# Patient Record
Sex: Female | Born: 1950 | Race: Black or African American | Hispanic: No | Marital: Married | Smoking: Never smoker
Health system: Southern US, Community
[De-identification: ages and names within clinical notes are randomized; demographics above are authoritative.]

## PROBLEM LIST (undated history)

## (undated) DIAGNOSIS — D509 Iron deficiency anemia, unspecified: Secondary | ICD-10-CM

## (undated) DIAGNOSIS — I10 Essential (primary) hypertension: Secondary | ICD-10-CM

## (undated) DIAGNOSIS — D473 Essential (hemorrhagic) thrombocythemia: Secondary | ICD-10-CM

## (undated) DIAGNOSIS — R011 Cardiac murmur, unspecified: Secondary | ICD-10-CM

## (undated) DIAGNOSIS — Z87442 Personal history of urinary calculi: Secondary | ICD-10-CM

## (undated) DIAGNOSIS — D759 Disease of blood and blood-forming organs, unspecified: Secondary | ICD-10-CM

## (undated) DIAGNOSIS — D75839 Thrombocytosis, unspecified: Secondary | ICD-10-CM

## (undated) DIAGNOSIS — E119 Type 2 diabetes mellitus without complications: Secondary | ICD-10-CM

## (undated) HISTORY — DX: Essential (hemorrhagic) thrombocythemia: D47.3

## (undated) HISTORY — DX: Thrombocytosis, unspecified: D75.839

## (undated) HISTORY — PX: ABDOMINAL HYSTERECTOMY: SHX81

## (undated) HISTORY — DX: Iron deficiency anemia, unspecified: D50.9

## (undated) HISTORY — PX: BONE MARROW BIOPSY: SHX199

## (undated) HISTORY — DX: Essential (primary) hypertension: I10

---

## 1982-03-02 HISTORY — PX: ECTOPIC PREGNANCY SURGERY: SHX613

## 1999-06-09 ENCOUNTER — Other Ambulatory Visit: Admission: RE | Admit: 1999-06-09 | Discharge: 1999-06-09 | Payer: Self-pay | Admitting: Internal Medicine

## 1999-07-14 ENCOUNTER — Encounter: Payer: Self-pay | Admitting: Internal Medicine

## 1999-07-14 ENCOUNTER — Ambulatory Visit (HOSPITAL_COMMUNITY): Admission: RE | Admit: 1999-07-14 | Discharge: 1999-07-14 | Payer: Self-pay | Admitting: Internal Medicine

## 2000-07-15 ENCOUNTER — Encounter: Payer: Self-pay | Admitting: Internal Medicine

## 2000-07-15 ENCOUNTER — Ambulatory Visit (HOSPITAL_COMMUNITY): Admission: RE | Admit: 2000-07-15 | Discharge: 2000-07-15 | Payer: Self-pay | Admitting: Internal Medicine

## 2002-01-10 ENCOUNTER — Ambulatory Visit (HOSPITAL_COMMUNITY): Admission: RE | Admit: 2002-01-10 | Discharge: 2002-01-10 | Payer: Self-pay | Admitting: Internal Medicine

## 2002-01-10 ENCOUNTER — Encounter: Payer: Self-pay | Admitting: Internal Medicine

## 2002-01-30 ENCOUNTER — Other Ambulatory Visit: Admission: RE | Admit: 2002-01-30 | Discharge: 2002-01-30 | Payer: Self-pay | Admitting: Internal Medicine

## 2003-04-24 ENCOUNTER — Ambulatory Visit (HOSPITAL_COMMUNITY): Admission: RE | Admit: 2003-04-24 | Discharge: 2003-04-24 | Payer: Self-pay | Admitting: Internal Medicine

## 2004-02-20 ENCOUNTER — Ambulatory Visit: Payer: Self-pay | Admitting: Internal Medicine

## 2004-02-20 ENCOUNTER — Other Ambulatory Visit: Admission: RE | Admit: 2004-02-20 | Discharge: 2004-02-20 | Payer: Self-pay | Admitting: Internal Medicine

## 2004-02-21 ENCOUNTER — Ambulatory Visit: Payer: Self-pay | Admitting: Hematology & Oncology

## 2004-02-27 ENCOUNTER — Encounter (INDEPENDENT_AMBULATORY_CARE_PROVIDER_SITE_OTHER): Payer: Self-pay | Admitting: Specialist

## 2004-02-27 ENCOUNTER — Ambulatory Visit (HOSPITAL_COMMUNITY): Admission: RE | Admit: 2004-02-27 | Discharge: 2004-02-27 | Payer: Self-pay | Admitting: Hematology & Oncology

## 2004-04-16 ENCOUNTER — Ambulatory Visit: Payer: Self-pay | Admitting: Hematology & Oncology

## 2004-05-01 ENCOUNTER — Ambulatory Visit (HOSPITAL_COMMUNITY): Admission: RE | Admit: 2004-05-01 | Discharge: 2004-05-01 | Payer: Self-pay | Admitting: Internal Medicine

## 2004-06-18 ENCOUNTER — Ambulatory Visit: Payer: Self-pay | Admitting: Hematology & Oncology

## 2004-08-12 ENCOUNTER — Ambulatory Visit: Payer: Self-pay | Admitting: Hematology & Oncology

## 2004-10-07 ENCOUNTER — Ambulatory Visit: Payer: Self-pay | Admitting: Hematology & Oncology

## 2004-11-24 ENCOUNTER — Ambulatory Visit: Payer: Self-pay | Admitting: Hematology & Oncology

## 2005-01-09 ENCOUNTER — Ambulatory Visit: Payer: Self-pay | Admitting: Hematology & Oncology

## 2005-03-06 ENCOUNTER — Ambulatory Visit: Payer: Self-pay | Admitting: Hematology & Oncology

## 2005-05-08 ENCOUNTER — Ambulatory Visit: Payer: Self-pay | Admitting: Hematology & Oncology

## 2005-06-11 LAB — CBC WITH DIFFERENTIAL/PLATELET
EOS%: 0.7 % (ref 0.0–7.0)
LYMPH%: 31.6 % (ref 14.0–48.0)
MCH: 32.9 pg (ref 26.0–34.0)
MCHC: 34.3 g/dL (ref 32.0–36.0)
MCV: 96 fL (ref 81.0–101.0)
MONO%: 12.4 % (ref 0.0–13.0)
RBC: 3.92 10*6/uL (ref 3.70–5.32)
RDW: 10.8 % — ABNORMAL LOW (ref 11.3–14.5)

## 2005-06-11 LAB — CHCC SMEAR

## 2005-06-16 ENCOUNTER — Ambulatory Visit (HOSPITAL_COMMUNITY): Admission: RE | Admit: 2005-06-16 | Discharge: 2005-06-16 | Payer: Self-pay | Admitting: Internal Medicine

## 2005-07-06 ENCOUNTER — Ambulatory Visit: Payer: Self-pay | Admitting: Hematology & Oncology

## 2005-07-08 LAB — CBC WITH DIFFERENTIAL/PLATELET
Eosinophils Absolute: 0 10*3/uL (ref 0.0–0.5)
HCT: 36.7 % (ref 34.8–46.6)
LYMPH%: 29 % (ref 14.0–48.0)
MCV: 97.4 fL (ref 81.0–101.0)
MONO#: 0.5 10*3/uL (ref 0.1–0.9)
MONO%: 12.2 % (ref 0.0–13.0)
NEUT#: 2.3 10*3/uL (ref 1.5–6.5)
NEUT%: 58.1 % (ref 39.6–76.8)
Platelets: 593 10*3/uL — ABNORMAL HIGH (ref 145–400)
WBC: 4 10*3/uL (ref 3.9–10.0)

## 2005-08-05 LAB — CBC WITH DIFFERENTIAL/PLATELET
Basophils Absolute: 0 10*3/uL (ref 0.0–0.1)
Eosinophils Absolute: 0 10*3/uL (ref 0.0–0.5)
HCT: 37.6 % (ref 34.8–46.6)
HGB: 12.4 g/dL (ref 11.6–15.9)
MCH: 32.4 pg (ref 26.0–34.0)
MCV: 98.3 fL (ref 81.0–101.0)
MONO%: 13.8 % — ABNORMAL HIGH (ref 0.0–13.0)
NEUT#: 2.7 10*3/uL (ref 1.5–6.5)
NEUT%: 52.5 % (ref 39.6–76.8)
RDW: 17.3 % — ABNORMAL HIGH (ref 11.3–14.5)
lymph#: 1.6 10*3/uL (ref 0.9–3.3)

## 2005-08-12 ENCOUNTER — Ambulatory Visit: Payer: Self-pay | Admitting: Internal Medicine

## 2005-08-26 ENCOUNTER — Ambulatory Visit: Payer: Self-pay | Admitting: Hematology & Oncology

## 2005-09-03 LAB — CBC WITH DIFFERENTIAL/PLATELET
Basophils Absolute: 0.1 10*3/uL (ref 0.0–0.1)
Eosinophils Absolute: 0 10*3/uL (ref 0.0–0.5)
HGB: 12 g/dL (ref 11.6–15.9)
MCV: 101 fL (ref 81.0–101.0)
MONO#: 0.3 10*3/uL (ref 0.1–0.9)
MONO%: 9.2 % (ref 0.0–13.0)
NEUT#: 2.2 10*3/uL (ref 1.5–6.5)
RBC: 3.57 10*6/uL — ABNORMAL LOW (ref 3.70–5.32)
RDW: 18.6 % — ABNORMAL HIGH (ref 11.3–14.5)
WBC: 3.8 10*3/uL — ABNORMAL LOW (ref 3.9–10.0)
lymph#: 1.1 10*3/uL (ref 0.9–3.3)

## 2005-09-03 LAB — CHCC SMEAR

## 2005-09-03 LAB — BASIC METABOLIC PANEL
Chloride: 104 mEq/L (ref 96–112)
Glucose, Bld: 164 mg/dL — ABNORMAL HIGH (ref 70–99)
Potassium: 3.5 mEq/L (ref 3.5–5.3)
Sodium: 139 mEq/L (ref 135–145)

## 2005-09-04 LAB — LIPID PANEL
Cholesterol: 174 mg/dL (ref 0–200)
HDL: 43 mg/dL
LDL Cholesterol: 118 mg/dL — ABNORMAL HIGH (ref 0–99)
Total CHOL/HDL Ratio: 4 ratio
Triglycerides: 63 mg/dL
VLDL: 13 mg/dL (ref 0–40)

## 2005-09-14 ENCOUNTER — Ambulatory Visit: Payer: Self-pay | Admitting: Internal Medicine

## 2005-10-01 LAB — CBC WITH DIFFERENTIAL/PLATELET
BASO%: 0.3 % (ref 0.0–2.0)
EOS%: 0.8 % (ref 0.0–7.0)
HCT: 34.5 % — ABNORMAL LOW (ref 34.8–46.6)
MCH: 34 pg (ref 26.0–34.0)
MCHC: 33.1 g/dL (ref 32.0–36.0)
MONO#: 0.7 10*3/uL (ref 0.1–0.9)
RDW: 15.9 % — ABNORMAL HIGH (ref 11.3–14.5)
WBC: 4.6 10*3/uL (ref 3.9–10.0)
lymph#: 1.5 10*3/uL (ref 0.9–3.3)

## 2005-11-16 ENCOUNTER — Ambulatory Visit: Payer: Self-pay | Admitting: Internal Medicine

## 2005-11-30 ENCOUNTER — Ambulatory Visit: Payer: Self-pay | Admitting: Hematology & Oncology

## 2006-01-13 LAB — CBC WITH DIFFERENTIAL/PLATELET
BASO%: 0.5 % (ref 0.0–2.0)
EOS%: 0.2 % (ref 0.0–7.0)
HCT: 34.5 % — ABNORMAL LOW (ref 34.8–46.6)
LYMPH%: 32.2 % (ref 14.0–48.0)
MCH: 36 pg — ABNORMAL HIGH (ref 26.0–34.0)
MCHC: 32.9 g/dL (ref 32.0–36.0)
MONO#: 0.6 10*3/uL (ref 0.1–0.9)
NEUT%: 47.5 % (ref 39.6–76.8)
Platelets: 398 10*3/uL (ref 145–400)
RBC: 3.16 10*6/uL — ABNORMAL LOW (ref 3.70–5.32)
WBC: 3.3 10*3/uL — ABNORMAL LOW (ref 3.9–10.0)
lymph#: 1.1 10*3/uL (ref 0.9–3.3)

## 2006-02-17 ENCOUNTER — Ambulatory Visit: Payer: Self-pay | Admitting: Hematology & Oncology

## 2006-02-24 LAB — CBC WITH DIFFERENTIAL/PLATELET
Basophils Absolute: 0 10*3/uL (ref 0.0–0.1)
EOS%: 0.5 % (ref 0.0–7.0)
Eosinophils Absolute: 0 10*3/uL (ref 0.0–0.5)
HCT: 36.2 % (ref 34.8–46.6)
HGB: 11.9 g/dL (ref 11.6–15.9)
MCH: 36.1 pg — ABNORMAL HIGH (ref 26.0–34.0)
MCV: 110.1 fL — ABNORMAL HIGH (ref 81.0–101.0)
MONO%: 12.2 % (ref 0.0–13.0)
NEUT#: 2.9 10*3/uL (ref 1.5–6.5)
NEUT%: 60.5 % (ref 39.6–76.8)
lymph#: 1.3 10*3/uL (ref 0.9–3.3)

## 2006-04-05 ENCOUNTER — Ambulatory Visit: Payer: Self-pay | Admitting: Hematology & Oncology

## 2006-04-07 LAB — CBC WITH DIFFERENTIAL/PLATELET
BASO%: 1 % (ref 0.0–2.0)
EOS%: 0.4 % (ref 0.0–7.0)
LYMPH%: 28.2 % (ref 14.0–48.0)
MCH: 37.3 pg — ABNORMAL HIGH (ref 26.0–34.0)
MCHC: 34.1 g/dL (ref 32.0–36.0)
MONO#: 0.4 10*3/uL (ref 0.1–0.9)
RBC: 3.31 10*6/uL — ABNORMAL LOW (ref 3.70–5.32)
WBC: 3.9 10*3/uL (ref 3.9–10.0)
lymph#: 1.1 10*3/uL (ref 0.9–3.3)

## 2006-04-07 LAB — CHCC SMEAR

## 2006-05-19 ENCOUNTER — Ambulatory Visit: Payer: Self-pay | Admitting: Hematology & Oncology

## 2006-05-21 LAB — CBC WITH DIFFERENTIAL/PLATELET
Basophils Absolute: 0 10*3/uL (ref 0.0–0.1)
Eosinophils Absolute: 0 10*3/uL (ref 0.0–0.5)
HCT: 35 % (ref 34.8–46.6)
HGB: 12.1 g/dL (ref 11.6–15.9)
LYMPH%: 15.6 % (ref 14.0–48.0)
MCHC: 34.5 g/dL (ref 32.0–36.0)
MONO#: 0.8 10*3/uL (ref 0.1–0.9)
NEUT#: 5.6 10*3/uL (ref 1.5–6.5)
NEUT%: 73.7 % (ref 39.6–76.8)
Platelets: 489 10*3/uL — ABNORMAL HIGH (ref 145–400)
WBC: 7.6 10*3/uL (ref 3.9–10.0)
lymph#: 1.2 10*3/uL (ref 0.9–3.3)

## 2006-06-21 ENCOUNTER — Ambulatory Visit (HOSPITAL_COMMUNITY): Admission: RE | Admit: 2006-06-21 | Discharge: 2006-06-21 | Payer: Self-pay | Admitting: Internal Medicine

## 2006-06-25 ENCOUNTER — Encounter (INDEPENDENT_AMBULATORY_CARE_PROVIDER_SITE_OTHER): Payer: Self-pay | Admitting: *Deleted

## 2006-06-25 ENCOUNTER — Ambulatory Visit: Payer: Self-pay | Admitting: Internal Medicine

## 2006-06-25 ENCOUNTER — Other Ambulatory Visit: Admission: RE | Admit: 2006-06-25 | Discharge: 2006-06-25 | Payer: Self-pay | Admitting: Internal Medicine

## 2006-06-25 LAB — CONVERTED CEMR LAB
ALT: 20 units/L (ref 0–40)
Alkaline Phosphatase: 76 units/L (ref 39–117)
BUN: 10 mg/dL (ref 6–23)
Basophils Absolute: 0 10*3/uL (ref 0.0–0.1)
Calcium: 9.1 mg/dL (ref 8.4–10.5)
Eosinophils Absolute: 0 10*3/uL (ref 0.0–0.6)
GFR calc Af Amer: 111 mL/min
GFR calc non Af Amer: 92 mL/min
HDL: 46.9 mg/dL (ref >39.0)
Lymphocytes Relative: 35.3 % (ref 12.0–46.0)
MCV: 109.4 fL — ABNORMAL HIGH (ref 78.0–100.0)
Monocytes Relative: 9.7 % (ref 3.0–11.0)
Neutro Abs: 2.1 10*3/uL (ref 1.4–7.7)
Platelets: 551 10*3/uL — ABNORMAL HIGH (ref 150–400)
Triglycerides: 44 mg/dL (ref 0–149)
VLDL: 9 mg/dL (ref 0–40)

## 2006-07-06 ENCOUNTER — Ambulatory Visit: Payer: Self-pay | Admitting: Hematology & Oncology

## 2006-07-06 LAB — CBC WITH DIFFERENTIAL/PLATELET
Basophils Absolute: 0.1 10*3/uL (ref 0.0–0.1)
EOS%: 0.9 % (ref 0.0–7.0)
Eosinophils Absolute: 0 10*3/uL (ref 0.0–0.5)
HCT: 37.6 % (ref 34.8–46.6)
HGB: 12.6 g/dL (ref 11.6–15.9)
MCH: 35.5 pg — ABNORMAL HIGH (ref 26.0–34.0)
MCV: 106 fL — ABNORMAL HIGH (ref 81.0–101.0)
MONO%: 14 % — ABNORMAL HIGH (ref 0.0–13.0)
NEUT#: 3 10*3/uL (ref 1.5–6.5)
NEUT%: 58.5 % (ref 39.6–76.8)
Platelets: 545 10*3/uL — ABNORMAL HIGH (ref 145–400)

## 2006-08-11 ENCOUNTER — Ambulatory Visit: Payer: Self-pay | Admitting: Internal Medicine

## 2006-08-11 LAB — CBC WITH DIFFERENTIAL/PLATELET
Basophils Absolute: 0 10*3/uL (ref 0.0–0.1)
EOS%: 0.7 % (ref 0.0–7.0)
Eosinophils Absolute: 0 10*3/uL (ref 0.0–0.5)
LYMPH%: 26.8 % (ref 14.0–48.0)
MCH: 35.5 pg — ABNORMAL HIGH (ref 26.0–34.0)
MCV: 106 fL — ABNORMAL HIGH (ref 81.0–101.0)
MONO%: 10.9 % (ref 0.0–13.0)
Platelets: 586 10*3/uL — ABNORMAL HIGH (ref 145–400)
RBC: 3.32 10*6/uL — ABNORMAL LOW (ref 3.70–5.32)
RDW: 12.8 % (ref 11.3–14.5)

## 2006-08-11 LAB — CHCC SMEAR

## 2006-08-11 LAB — CONVERTED CEMR LAB
CO2: 32 meq/L (ref 19–32)
Calcium: 9.1 mg/dL (ref 8.4–10.5)
Chloride: 102 meq/L (ref 96–112)
GFR calc non Af Amer: 92 mL/min
Glucose, Bld: 112 mg/dL — ABNORMAL HIGH (ref 70–99)

## 2006-08-13 LAB — FERRITIN: Ferritin: 158 ng/mL (ref 10–291)

## 2006-10-18 DIAGNOSIS — I10 Essential (primary) hypertension: Secondary | ICD-10-CM | POA: Insufficient documentation

## 2006-12-06 ENCOUNTER — Ambulatory Visit: Payer: Self-pay | Admitting: Hematology & Oncology

## 2006-12-08 ENCOUNTER — Encounter: Payer: Self-pay | Admitting: Internal Medicine

## 2006-12-08 LAB — CBC WITH DIFFERENTIAL/PLATELET
Basophils Absolute: 0.1 10*3/uL (ref 0.0–0.1)
EOS%: 0.9 % (ref 0.0–7.0)
Eosinophils Absolute: 0 10*3/uL (ref 0.0–0.5)
HCT: 36.3 % (ref 34.8–46.6)
HGB: 12.3 g/dL (ref 11.6–15.9)
MCH: 34.7 pg — ABNORMAL HIGH (ref 26.0–34.0)
MCV: 102 fL — ABNORMAL HIGH (ref 81.0–101.0)
MONO%: 9.2 % (ref 0.0–13.0)
NEUT#: 3.4 10*3/uL (ref 1.5–6.5)
NEUT%: 63.1 % (ref 39.6–76.8)
Platelets: 684 10*3/uL — ABNORMAL HIGH (ref 145–400)
RDW: 11.4 % (ref 11.3–14.5)

## 2007-04-06 ENCOUNTER — Ambulatory Visit: Payer: Self-pay | Admitting: Hematology & Oncology

## 2007-04-11 ENCOUNTER — Encounter: Payer: Self-pay | Admitting: Internal Medicine

## 2007-04-11 LAB — CBC WITH DIFFERENTIAL/PLATELET
Eosinophils Absolute: 0 10*3/uL (ref 0.0–0.5)
HCT: 35.6 % (ref 34.8–46.6)
HGB: 12.2 g/dL (ref 11.6–15.9)
LYMPH%: 23.9 % (ref 14.0–48.0)
MONO#: 0.4 10*3/uL (ref 0.1–0.9)
NEUT#: 3.7 10*3/uL (ref 1.5–6.5)
NEUT%: 67.3 % (ref 39.6–76.8)
Platelets: 708 10*3/uL — ABNORMAL HIGH (ref 145–400)
WBC: 5.6 10*3/uL (ref 3.9–10.0)
lymph#: 1.3 10*3/uL (ref 0.9–3.3)

## 2007-06-23 ENCOUNTER — Ambulatory Visit (HOSPITAL_COMMUNITY): Admission: RE | Admit: 2007-06-23 | Discharge: 2007-06-23 | Payer: Self-pay | Admitting: Internal Medicine

## 2007-07-11 ENCOUNTER — Encounter: Payer: Self-pay | Admitting: Internal Medicine

## 2007-07-11 ENCOUNTER — Ambulatory Visit: Payer: Self-pay | Admitting: Hematology & Oncology

## 2007-07-11 LAB — CBC WITH DIFFERENTIAL/PLATELET
Basophils Absolute: 0 10*3/uL (ref 0.0–0.1)
Eosinophils Absolute: 0 10*3/uL (ref 0.0–0.5)
HGB: 12.6 g/dL (ref 11.6–15.9)
MCV: 107.2 fL — ABNORMAL HIGH (ref 81.0–101.0)
MONO#: 0.5 10*3/uL (ref 0.1–0.9)
NEUT#: 2.5 10*3/uL (ref 1.5–6.5)
RDW: 12.9 % (ref 11.3–14.5)
WBC: 4.4 10*3/uL (ref 3.9–10.0)
lymph#: 1.3 10*3/uL (ref 0.9–3.3)

## 2007-10-10 ENCOUNTER — Ambulatory Visit: Payer: Self-pay | Admitting: Hematology & Oncology

## 2007-10-12 ENCOUNTER — Encounter: Payer: Self-pay | Admitting: Internal Medicine

## 2008-02-14 ENCOUNTER — Ambulatory Visit: Payer: Self-pay | Admitting: Hematology & Oncology

## 2008-02-15 ENCOUNTER — Encounter: Payer: Self-pay | Admitting: Internal Medicine

## 2008-02-15 LAB — CBC WITH DIFFERENTIAL (CANCER CENTER ONLY)
BASO#: 0 10*3/uL (ref 0.0–0.2)
EOS%: 1.3 % (ref 0.0–7.0)
HCT: 39.6 % (ref 34.8–46.6)
HGB: 13.3 g/dL (ref 11.6–15.9)
LYMPH#: 1.4 10*3/uL (ref 0.9–3.3)
MCH: 34.5 pg — ABNORMAL HIGH (ref 26.0–34.0)
MCHC: 33.7 g/dL (ref 32.0–36.0)
NEUT%: 60 % (ref 39.6–80.0)

## 2008-06-05 ENCOUNTER — Ambulatory Visit: Payer: Self-pay | Admitting: Hematology & Oncology

## 2008-06-06 ENCOUNTER — Encounter: Payer: Self-pay | Admitting: Internal Medicine

## 2008-06-06 LAB — CBC WITH DIFFERENTIAL (CANCER CENTER ONLY)
BASO#: 0 10*3/uL (ref 0.0–0.2)
BASO%: 0.7 % (ref 0.0–2.0)
HCT: 40.7 % (ref 34.8–46.6)
HGB: 13.5 g/dL (ref 11.6–15.9)
LYMPH#: 1.6 10*3/uL (ref 0.9–3.3)
MONO#: 0.4 10*3/uL (ref 0.1–0.9)
NEUT%: 52.8 % (ref 39.6–80.0)
WBC: 4.3 10*3/uL (ref 3.9–10.0)

## 2008-06-06 LAB — CHCC SATELLITE - SMEAR

## 2008-06-06 LAB — FERRITIN: Ferritin: 147 ng/mL (ref 10–291)

## 2008-06-08 ENCOUNTER — Telehealth: Payer: Self-pay | Admitting: Internal Medicine

## 2008-06-26 ENCOUNTER — Ambulatory Visit (HOSPITAL_COMMUNITY): Admission: RE | Admit: 2008-06-26 | Discharge: 2008-06-26 | Payer: Self-pay | Admitting: Internal Medicine

## 2008-07-24 ENCOUNTER — Ambulatory Visit: Payer: Self-pay | Admitting: Internal Medicine

## 2008-07-24 LAB — CONVERTED CEMR LAB
Albumin: 3.7 g/dL (ref 3.5–5.2)
BUN: 8 mg/dL (ref 6–23)
Basophils Absolute: 0 10*3/uL (ref 0.0–0.1)
Blood in Urine, dipstick: NEGATIVE
CO2: 31 meq/L (ref 19–32)
Calcium: 8.8 mg/dL (ref 8.4–10.5)
Cholesterol: 182 mg/dL (ref 0–200)
Creatinine, Ser: 0.7 mg/dL (ref 0.4–1.2)
Eosinophils Absolute: 0 10*3/uL (ref 0.0–0.7)
Glucose, Bld: 128 mg/dL — ABNORMAL HIGH (ref 70–99)
HDL: 45 mg/dL (ref >39.00)
Hemoglobin: 12.9 g/dL (ref 12.0–15.0)
Ketones, urine, test strip: NEGATIVE
Lymphocytes Relative: 35 % (ref 12.0–46.0)
Lymphs Abs: 1.2 10*3/uL (ref 0.7–4.0)
Monocytes Absolute: 0.8 10*3/uL (ref 0.1–1.0)
Neutro Abs: 1.4 10*3/uL (ref 1.4–7.7)
Nitrite: NEGATIVE
RBC: 3.56 M/uL — ABNORMAL LOW (ref 3.87–5.11)
Specific Gravity, Urine: 1.01
TSH: 1.66 microintl units/mL (ref 0.35–5.50)
Total CHOL/HDL Ratio: 4
Total Protein: 7.7 g/dL (ref 6.0–8.3)
Triglycerides: 56 mg/dL (ref 0.0–149.0)
Urobilinogen, UA: 0.2
Vit D, 25-Hydroxy: 36 ng/mL (ref 30–89)
WBC: 3.4 10*3/uL — ABNORMAL LOW (ref 4.5–10.5)

## 2008-07-31 ENCOUNTER — Ambulatory Visit: Payer: Self-pay | Admitting: Internal Medicine

## 2008-07-31 ENCOUNTER — Other Ambulatory Visit: Admission: RE | Admit: 2008-07-31 | Discharge: 2008-07-31 | Payer: Self-pay | Admitting: Internal Medicine

## 2008-07-31 ENCOUNTER — Encounter: Payer: Self-pay | Admitting: Internal Medicine

## 2008-07-31 DIAGNOSIS — E876 Hypokalemia: Secondary | ICD-10-CM | POA: Insufficient documentation

## 2008-07-31 DIAGNOSIS — R7309 Other abnormal glucose: Secondary | ICD-10-CM | POA: Insufficient documentation

## 2008-07-31 DIAGNOSIS — E559 Vitamin D deficiency, unspecified: Secondary | ICD-10-CM | POA: Insufficient documentation

## 2008-07-31 DIAGNOSIS — D473 Essential (hemorrhagic) thrombocythemia: Secondary | ICD-10-CM | POA: Insufficient documentation

## 2008-08-02 ENCOUNTER — Telehealth: Payer: Self-pay | Admitting: Internal Medicine

## 2008-08-07 ENCOUNTER — Encounter: Payer: Self-pay | Admitting: Internal Medicine

## 2008-08-07 ENCOUNTER — Ambulatory Visit: Payer: Self-pay

## 2008-08-14 ENCOUNTER — Encounter: Payer: Self-pay | Admitting: Internal Medicine

## 2008-08-22 ENCOUNTER — Telehealth: Payer: Self-pay | Admitting: Internal Medicine

## 2008-08-30 ENCOUNTER — Ambulatory Visit: Payer: Self-pay | Admitting: Cardiology

## 2008-08-30 DIAGNOSIS — R9431 Abnormal electrocardiogram [ECG] [EKG]: Secondary | ICD-10-CM | POA: Insufficient documentation

## 2008-09-04 ENCOUNTER — Ambulatory Visit: Payer: Self-pay | Admitting: Hematology & Oncology

## 2008-09-05 ENCOUNTER — Encounter: Payer: Self-pay | Admitting: Internal Medicine

## 2008-09-05 LAB — CBC WITH DIFFERENTIAL (CANCER CENTER ONLY)
BASO%: 0.5 % (ref 0.0–2.0)
EOS%: 1.4 % (ref 0.0–7.0)
HCT: 38.7 % (ref 34.8–46.6)
LYMPH%: 36.9 % (ref 14.0–48.0)
MCHC: 33 g/dL (ref 32.0–36.0)
MCV: 105 fL — ABNORMAL HIGH (ref 81–101)
MONO%: 10.8 % (ref 0.0–13.0)
NEUT%: 50.4 % (ref 39.6–80.0)
Platelets: 585 10*3/uL — ABNORMAL HIGH (ref 145–400)
RDW: 11.6 % (ref 10.5–14.6)
WBC: 3.6 10*3/uL — ABNORMAL LOW (ref 3.9–10.0)

## 2008-09-07 ENCOUNTER — Ambulatory Visit: Payer: Self-pay | Admitting: Internal Medicine

## 2008-10-30 ENCOUNTER — Ambulatory Visit: Payer: Self-pay | Admitting: Cardiology

## 2008-12-18 ENCOUNTER — Ambulatory Visit: Payer: Self-pay | Admitting: Hematology & Oncology

## 2008-12-19 ENCOUNTER — Encounter (INDEPENDENT_AMBULATORY_CARE_PROVIDER_SITE_OTHER): Payer: Self-pay | Admitting: *Deleted

## 2008-12-19 LAB — CBC WITH DIFFERENTIAL (CANCER CENTER ONLY)
Eosinophils Absolute: 0.1 10*3/uL (ref 0.0–0.5)
HCT: 37.1 % (ref 34.8–46.6)
LYMPH%: 38.7 % (ref 14.0–48.0)
MCH: 35.9 pg — ABNORMAL HIGH (ref 26.0–34.0)
MCV: 105 fL — ABNORMAL HIGH (ref 81–101)
MONO#: 0.5 10*3/uL (ref 0.1–0.9)
MONO%: 11.8 % (ref 0.0–13.0)
NEUT%: 47.9 % (ref 39.6–80.0)
Platelets: 577 10*3/uL — ABNORMAL HIGH (ref 145–400)
RDW: 13.4 % (ref 10.5–14.6)
WBC: 4.3 10*3/uL (ref 3.9–10.0)

## 2008-12-20 LAB — VITAMIN D 25 HYDROXY (VIT D DEFICIENCY, FRACTURES): Vit D, 25-Hydroxy: 29 ng/mL — ABNORMAL LOW (ref 30–89)

## 2009-01-16 ENCOUNTER — Ambulatory Visit: Payer: Self-pay | Admitting: Internal Medicine

## 2009-01-16 LAB — CONVERTED CEMR LAB
CO2: 32 meq/L (ref 19–32)
Glucose, Bld: 94 mg/dL (ref 70–99)
Potassium: 2.8 meq/L — CL (ref 3.5–5.1)
Sodium: 142 meq/L (ref 135–145)

## 2009-01-29 ENCOUNTER — Ambulatory Visit: Payer: Self-pay | Admitting: Internal Medicine

## 2009-02-05 LAB — CONVERTED CEMR LAB
BUN: 10 mg/dL (ref 6–23)
CO2: 29 meq/L (ref 19–32)
Chloride: 104 meq/L (ref 96–112)
Creatinine, Ser: 0.8 mg/dL (ref 0.4–1.2)
Potassium: 3.3 meq/L — ABNORMAL LOW (ref 3.5–5.1)

## 2009-03-06 ENCOUNTER — Ambulatory Visit: Payer: Self-pay | Admitting: Internal Medicine

## 2009-04-09 ENCOUNTER — Ambulatory Visit: Payer: Self-pay | Admitting: Hematology & Oncology

## 2009-04-15 ENCOUNTER — Telehealth: Payer: Self-pay | Admitting: *Deleted

## 2009-04-17 ENCOUNTER — Encounter: Payer: Self-pay | Admitting: Internal Medicine

## 2009-04-17 LAB — CBC WITH DIFFERENTIAL (CANCER CENTER ONLY)
BASO#: 0 10*3/uL (ref 0.0–0.2)
EOS%: 1.4 % (ref 0.0–7.0)
HCT: 38.3 % (ref 34.8–46.6)
HGB: 12.8 g/dL (ref 11.6–15.9)
LYMPH#: 1.4 10*3/uL (ref 0.9–3.3)
LYMPH%: 35.4 % (ref 14.0–48.0)
MCH: 34.7 pg — ABNORMAL HIGH (ref 26.0–34.0)
MCHC: 33.4 g/dL (ref 32.0–36.0)
MCV: 104 fL — ABNORMAL HIGH (ref 81–101)
MONO%: 11.3 % (ref 0.0–13.0)
NEUT%: 51.4 % (ref 39.6–80.0)

## 2009-04-26 LAB — BASIC METABOLIC PANEL
BUN: 13 mg/dL (ref 6–23)
Glucose, Bld: 89 mg/dL (ref 70–99)
Potassium: 4 mEq/L (ref 3.5–5.3)

## 2009-04-26 LAB — FERRITIN: Ferritin: 143 ng/mL (ref 10–291)

## 2009-06-10 ENCOUNTER — Telehealth: Payer: Self-pay | Admitting: *Deleted

## 2009-06-11 ENCOUNTER — Ambulatory Visit: Payer: Self-pay | Admitting: Hematology & Oncology

## 2009-06-12 ENCOUNTER — Encounter: Payer: Self-pay | Admitting: Internal Medicine

## 2009-06-12 ENCOUNTER — Telehealth: Payer: Self-pay | Admitting: Internal Medicine

## 2009-06-12 LAB — CBC WITH DIFFERENTIAL (CANCER CENTER ONLY)
BASO#: 0 10*3/uL (ref 0.0–0.2)
Eosinophils Absolute: 0.1 10*3/uL (ref 0.0–0.5)
HGB: 13.2 g/dL (ref 11.6–15.9)
LYMPH%: 36 % (ref 14.0–48.0)
MCH: 34.2 pg — ABNORMAL HIGH (ref 26.0–34.0)
MCV: 104 fL — ABNORMAL HIGH (ref 81–101)
MONO%: 7.3 % (ref 0.0–13.0)
RBC: 3.86 10*6/uL (ref 3.70–5.32)

## 2009-06-25 ENCOUNTER — Ambulatory Visit: Payer: Self-pay | Admitting: Internal Medicine

## 2009-07-08 ENCOUNTER — Ambulatory Visit (HOSPITAL_COMMUNITY): Admission: RE | Admit: 2009-07-08 | Discharge: 2009-07-08 | Payer: Self-pay | Admitting: Internal Medicine

## 2009-07-24 ENCOUNTER — Ambulatory Visit: Payer: Self-pay | Admitting: Internal Medicine

## 2009-08-09 ENCOUNTER — Ambulatory Visit: Payer: Self-pay | Admitting: Hematology & Oncology

## 2009-08-12 ENCOUNTER — Encounter: Payer: Self-pay | Admitting: Internal Medicine

## 2009-08-12 LAB — CBC WITH DIFFERENTIAL (CANCER CENTER ONLY)
BASO#: 0 10*3/uL (ref 0.0–0.2)
Eosinophils Absolute: 0.1 10*3/uL (ref 0.0–0.5)
HCT: 40.6 % (ref 34.8–46.6)
HGB: 13.7 g/dL (ref 11.6–15.9)
LYMPH#: 1.2 10*3/uL (ref 0.9–3.3)
MCH: 35 pg — ABNORMAL HIGH (ref 26.0–34.0)
MONO%: 7.8 % (ref 0.0–13.0)
NEUT#: 3.6 10*3/uL (ref 1.5–6.5)
NEUT%: 66.8 % (ref 39.6–80.0)
RBC: 3.9 10*6/uL (ref 3.70–5.32)

## 2009-08-22 ENCOUNTER — Telehealth: Payer: Self-pay | Admitting: Internal Medicine

## 2009-08-30 ENCOUNTER — Telehealth: Payer: Self-pay | Admitting: *Deleted

## 2009-09-03 ENCOUNTER — Ambulatory Visit: Payer: Self-pay | Admitting: Internal Medicine

## 2009-10-07 ENCOUNTER — Telehealth: Payer: Self-pay | Admitting: *Deleted

## 2009-10-29 ENCOUNTER — Ambulatory Visit: Payer: Self-pay | Admitting: Internal Medicine

## 2009-11-08 ENCOUNTER — Ambulatory Visit: Payer: Self-pay | Admitting: Hematology & Oncology

## 2009-11-11 ENCOUNTER — Encounter: Payer: Self-pay | Admitting: Internal Medicine

## 2009-11-11 LAB — CBC WITH DIFFERENTIAL (CANCER CENTER ONLY)
BASO#: 0 10*3/uL (ref 0.0–0.2)
Eosinophils Absolute: 0.1 10*3/uL (ref 0.0–0.5)
HCT: 39.2 % (ref 34.8–46.6)
HGB: 13 g/dL (ref 11.6–15.9)
LYMPH%: 31.8 % (ref 14.0–48.0)
MCH: 34.5 pg — ABNORMAL HIGH (ref 26.0–34.0)
MCV: 104 fL — ABNORMAL HIGH (ref 81–101)
MONO#: 0.5 10*3/uL (ref 0.1–0.9)
Platelets: 732 10*3/uL — ABNORMAL HIGH (ref 145–400)
RBC: 3.78 10*6/uL (ref 3.70–5.32)
WBC: 4.7 10*3/uL (ref 3.9–10.0)

## 2009-11-11 LAB — CHCC SATELLITE - SMEAR

## 2009-11-18 ENCOUNTER — Telehealth: Payer: Self-pay | Admitting: Internal Medicine

## 2010-01-31 ENCOUNTER — Ambulatory Visit: Payer: Self-pay | Admitting: Hematology & Oncology

## 2010-02-03 ENCOUNTER — Encounter: Payer: Self-pay | Admitting: Internal Medicine

## 2010-02-18 ENCOUNTER — Ambulatory Visit: Payer: Self-pay | Admitting: Internal Medicine

## 2010-02-18 LAB — CONVERTED CEMR LAB
ALT: 20 units/L (ref 0–35)
AST: 20 units/L (ref 0–37)
Albumin: 3.8 g/dL (ref 3.5–5.2)
Alkaline Phosphatase: 90 units/L (ref 39–117)
Basophils Relative: 0.5 % (ref 0.0–3.0)
Bilirubin, Direct: 0.1 mg/dL (ref 0.0–0.3)
CO2: 30 meq/L (ref 19–32)
Calcium: 9 mg/dL (ref 8.4–10.5)
Chloride: 104 meq/L (ref 96–112)
Cholesterol: 207 mg/dL — ABNORMAL HIGH (ref 0–200)
Creatinine, Ser: 0.6 mg/dL (ref 0.4–1.2)
Eosinophils Relative: 0.7 % (ref 0.0–5.0)
Hemoglobin: 12.3 g/dL (ref 12.0–15.0)
Lymphocytes Relative: 25.9 % (ref 12.0–46.0)
MCHC: 32.9 g/dL (ref 30.0–36.0)
Neutro Abs: 2.8 10*3/uL (ref 1.4–7.7)
Neutrophils Relative %: 62.3 % (ref 43.0–77.0)
RBC: 3.4 M/uL — ABNORMAL LOW (ref 3.87–5.11)
Sodium: 141 meq/L (ref 135–145)
Total CHOL/HDL Ratio: 4
Total Protein: 7 g/dL (ref 6.0–8.3)
Triglycerides: 49 mg/dL (ref 0.0–149.0)
VLDL: 9.8 mg/dL (ref 0.0–40.0)
WBC: 4.6 10*3/uL (ref 4.5–10.5)

## 2010-02-25 ENCOUNTER — Other Ambulatory Visit
Admission: RE | Admit: 2010-02-25 | Discharge: 2010-02-25 | Payer: Self-pay | Source: Home / Self Care | Admitting: Internal Medicine

## 2010-02-25 ENCOUNTER — Ambulatory Visit: Payer: Self-pay | Admitting: Internal Medicine

## 2010-02-27 ENCOUNTER — Telehealth: Payer: Self-pay | Admitting: *Deleted

## 2010-03-04 LAB — CONVERTED CEMR LAB: Pap Smear: NEGATIVE

## 2010-04-01 NOTE — Assessment & Plan Note (Signed)
Summary: follow up/cjr   Vital Signs:  Patient profile:   60 year old female Menstrual status:  postmenopausal Height:      71 inches Weight:      184 pounds Temp:     98.3 degrees F oral Pulse rate:   83 / minute BP sitting:   162 / 100  (right arm)  Vitals Entered By: Kathrynn Speed CMA (Jul 24, 2009 11:34 AM)  Serial Vital Signs/Assessments:  Time      Position  BP       Pulse  Resp  Temp     By                     165/90                         Madelin Headings MD                     155/86                         Madelin Headings MD  Comments: right arm By: Madelin Headings MD  right arm sitting.  By: Madelin Headings MD   CC: FU/ BP, Hypertension Management   History of Present Illness: Pa  Alexandra [name] comes in today  for follow up of her hypertension.  Her bp readings are in the 145 150 range at home and didnt start thte ne med ( amllopdipine) is taking the ace and teh bystolic 20 without apparent se .   !42/92  .Marland Kitchen!57/89 .  range      Feels fine  so was hesitant  to start the medication .  No new cp sob or edema . No has .    Hypertension History:      She complains of headache, peripheral edema, and side effects from treatment, but denies chest pain, palpitations, dyspnea with exertion, visual symptoms, neurologic problems, and syncope.  She notes the following problems with antihypertensive medication side effects: Swelling hands & feet may have headache from new med? Bystolic?Marland Kitchen        Positive major cardiovascular risk factors include female age 58 years old or older and hypertension.  Negative major cardiovascular risk factors include non-tobacco-user status.     Preventive Screening-Counseling & Management  Alcohol-Tobacco     Alcohol drinks/day: 0     Smoking Status: never  Caffeine-Diet-Exercise     Caffeine use/day: 0     Does Patient Exercise: yes     Type of exercise: walking     Times/week: 5   Current Medications (verified): 1)  Hydroxyurea 500 Mg  Caps (Hydroxyurea) .... 3 Times A Day 2)  Adult Aspirin Low Strength 81 Mg  Tbdp (Aspirin) .... Once Daily 3)  Lisinopril 20 Mg Tabs (Lisinopril) .Marland Kitchen.. 1 By Mouth Once Daily 4)  Bystolic 20 Mg Tabs (Nebivolol Hcl) .Marland Kitchen.. 1 By Mouth Once Daily 5)  Amlodipine Besylate 5 Mg Tabs (Amlodipine Besylate) .Marland Kitchen.. 1 By Mouth Once Daily  For High Blood Pressure 6)  Vitamin D3 1000 Unit Caps (Cholecalciferol)  Allergies (verified): No Known Drug Allergies  Past History:  Past medical, surgical, family and social histories (including risk factors) reviewed, and no changes noted (except as noted below).  Past Medical History: Reviewed history from 03/06/2009 and no changes required. Thrombocytosis under rx  UTI's Hypertension Echo   Past Surgical History:  Reviewed history from 08/30/2008 and no changes required. Surgery for Tubal Pregnancy '84 G4 P2 Bone Marrow Biopsy  Family History: Reviewed history from 08/30/2008 and no changes required. Family History of Arthritis Family History Diabetes 1st degree relative Family History Hypertension Family History Kidney disease Family History of Stroke M 1st degree relative <50 Father: massive MI   71  Mother: Died of  heart infection    55..  dialysis .   Social History: Reviewed history from 08/30/2008 and no changes required. Occupation: Office Married Never Smoked Alcohol use-no Drug use-no Regular exercise-no hh of 4    pets no   Caffeine use/day:  0  Review of Systems  The patient denies anorexia, fever, weight loss, weight gain, vision loss, syncope, dyspnea on exertion, prolonged cough, abdominal pain, abnormal bleeding, enlarged lymph nodes, and angioedema.    Physical Exam  General:  Well-developed,well-nourished,in no acute distress; alert,appropriate and cooperative throughout examination Head:  normocephalic and atraumatic.   Lungs:  normal respiratory effort, no intercostal retractions, and no accessory muscle use.   Heart:   normal rate, regular rhythm, no murmur, no gallop, and no JVD.   Pulses:  pulses intact without delay   Extremities:  no clubbing cyanosis or edema  Neurologic:  non focal  Skin:  turgor normal, color normal, no ecchymoses, and no petechiae.   Cervical Nodes:  No lymphadenopathy noted Psych:  Oriented X3, good eye contact, not anxious appearing, and not depressed appearing.     Impression & Recommendations:  Problem # 1:  HYPERTENSION (ICD-401.9) Assessment Unchanged uncontrolled     did not start the  amlodipine      needs to start this    cannot call this resistent   HT yet b ecause not yet taking 3 meds a tsig dosing.   She is very cautious with meds and  unfortunately has  had elevated readings for wuite some t ime  ALbeit reportedely better at home.  review of record she developed  hypokalemia on diuretic that resolved with dc and k relacement.  and her last k was nl in  Feb . Her updated medication list for this problem includes:    Lisinopril 20 Mg Tabs (Lisinopril) .Marland Kitchen... 1 by mouth once daily    Bystolic 20 Mg Tabs (Nebivolol hcl) .Marland Kitchen... 1 by mouth once daily    Amlodipine Besylate 5 Mg Tabs (Amlodipine besylate) .Marland Kitchen... 1 by mouth once daily  for high blood pressure  Problem # 2:  THROMBOCYTHEMIA (ICD-238.71) Assessment: Improved stable on meds   Complete Medication List: 1)  Hydroxyurea 500 Mg Caps (Hydroxyurea) .... 3 times a day 2)  Adult Aspirin Low Strength 81 Mg Tbdp (Aspirin) .... Once daily 3)  Lisinopril 20 Mg Tabs (Lisinopril) .Marland Kitchen.. 1 by mouth once daily 4)  Bystolic 20 Mg Tabs (Nebivolol hcl) .Marland Kitchen.. 1 by mouth once daily 5)  Amlodipine Besylate 5 Mg Tabs (Amlodipine besylate) .Marland Kitchen.. 1 by mouth once daily  for high blood pressure 6)  Vitamin D3 1000 Unit Caps (Cholecalciferol)  Hypertension Assessment/Plan:      The patient's hypertensive risk group is category B: At least one risk factor (excluding diabetes) with no target organ damage.  Her calculated 10 year risk of  coronary heart disease is 15 %.  Today's blood pressure is 162/100.  Her blood pressure goal is < 140/90.  Patient Instructions: 1)  continue the bystolic  and add the amlodipine 5 mg every day  and ROV  in 3-4 weeks .  2)  call in meantime  if   readings are getting worse.

## 2010-04-01 NOTE — Progress Notes (Signed)
Summary: samples  Phone Note Call from Patient Call back at [facility] Phone [phone]   Caller: Patient Summary of Call: samples of bystolic Initial call taken by: Romualdo Bolk, CMA (AAMA),  October 07, 2009 11:45 AM  Follow-up for Phone Call        samples given until her appt. Pt aware that they are up front. Follow-up by: Romualdo Bolk, CMA (AAMA),  October 07, 2009 11:45 AM

## 2010-04-01 NOTE — Progress Notes (Signed)
Summary: BP  Phone Note From Other Clinic   Caller: [facility] [phone] Summary of Call: Saw her in office today.  BP 188/107 & 155/95.  Notifying you.  Think Dr. Demetrius Charity working with her on this. Initial call taken by: Rudy Jew, RN,  June 12, 2009 3:40 PM  Follow-up for Phone Call        Per Dr. Fabian Sharp- Have pt go up to 20mg  a day and schedule a rov with bp cuff. Follow-up by: Romualdo Bolk, CMA Duncan Dull),  June 12, 2009 5:20 PM  Additional Follow-up for Phone Call Additional follow up Details #1::        Pt aware and will come in on 4/26 with bp readings and cuff. Additional Follow-up by: Romualdo Bolk, CMA (AAMA),  June 13, 2009 10:03 AM

## 2010-04-01 NOTE — Assessment & Plan Note (Signed)
Summary: follow up/ssc   Vital Signs:  Patient profile:   60 year old female Menstrual status:  postmenopausal Weight:      181 pounds Pulse rate:   60 / minute BP sitting:   170 / 100  (right arm) Cuff size:   regular  Vitals Entered By: Romualdo Bolk, CMA (AAMA) (March 06, 2009 12:40 PM)  Serial Vital Signs/Assessments:  Time      Position  BP       Pulse  Resp  Temp     By                     160/92                         Madelin Headings MD  Comments: right arm large cuff sitting By: Madelin Headings MD   CC: Follow-up visit on meds, Hypertension Management   History of Present Illness: Amazing Cowman comesin for.fu of high bp readings . Since last visit  here  there have been no major changes in health status  .  she has added extra potassium as  rec on phone note and no longer on the diuretic . she is on 5 of bystolic without se . readings are still some up  150 and 140 range at home   . No new CV signs .   Hypertension History:      She complains of headache and peripheral edema, but denies chest pain, palpitations, dyspnea with exertion, orthopnea, PND, visual symptoms, neurologic problems, syncope, and side effects from treatment.  She notes no problems with any antihypertensive medication side effects.        Positive major cardiovascular risk factors include female age 60 years old or older and hypertension.  Negative major cardiovascular risk factors include non-tobacco-user status.     Preventive Screening-Counseling & Management  Alcohol-Tobacco     Alcohol drinks/day: 0     Smoking Status: never  Caffeine-Diet-Exercise     Caffeine use/day: 2-3     Does Patient Exercise: yes     Type of exercise: walking     Times/week: 5   Current Medications (verified): 1)  Hydroxyurea 500 Mg Caps (Hydroxyurea) .... Once Daily 2)  Adult Aspirin Low Strength 81 Mg  Tbdp (Aspirin) .... Once Daily 3)  Vitamin D (Ergocalciferol) 50000 Unit Caps (Ergocalciferol)  .Marland Kitchen.. 1 By Mouth Weekly 4)  Lisinopril 20 Mg Tabs (Lisinopril) .Marland Kitchen.. 1 By Mouth Once Daily 5)  Potassium Chloride Cr 10 Meq Cr-Tabs (Potassium Chloride) .... 3  By Mouth Once Daily 6)  Bystolic 10 Mg Tabs (Nebivolol Hcl) .... Take 1/2 By Mouth Once Daily  May Increase To 1 By Mouth Once Daily  Allergies (verified): No Known Drug Allergies  Past History:  Past medical, surgical, family and social histories (including risk factors) reviewed for relevance to current acute and chronic problems.  Past Medical History: Thrombocytosis under rx  UTI's Hypertension Echo   Past Surgical History: Reviewed history from 08/30/2008 and no changes required. Surgery for Tubal Pregnancy '84 G4 P2 Bone Marrow Biopsy  Family History: Reviewed history from 08/30/2008 and no changes required. Family History of Arthritis Family History Diabetes 1st degree relative Family History Hypertension Family History Kidney disease Family History of Stroke M 1st degree relative <50 Father: massive MI   59  Mother: Died of  heart infection    81..  dialysis .   Social  History: Reviewed history from 08/30/2008 and no changes required. 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    Physical Exam  General:  Well-developed,well-nourished,in no acute distress; alert,appropriate and cooperative throughout examination. looks well  Head:  normocephalic and atraumatic.   Lungs:  Normal respiratory effort, chest expands symmetrically. Lungs are clear to auscultation, no crackles or wheezes. Heart:  Normal rate and regular rhythm. S1 and S2 normal without gallop, murmur, click, rub or other extra sounds. Extremities:  no clubbing cyanosis or edema     Impression & Recommendations:  Problem # 1:  HYPERTENSION (ICD-401.9) Assessment Unchanged minimal imrovment . but no se of meds  Her updated medication list for this problem includes:    Lisinopril 20 Mg Tabs (Lisinopril) .Marland Kitchen... 1 by mouth once daily    Bystolic 10 Mg Tabs (Nebivolol hcl) .Marland Kitchen... Take 1/2 by mouth once daily  may increase to 1 by mouth once daily  Problem # 2:  HYPOKALEMIA (ICD-276.8) Assessment: Improved consinder hyper aldo situation and using spironolactone if resistant  other causes  Complete Medication List: 1)  Hydroxyurea 500 Mg Caps (Hydroxyurea) .... Once daily 2)  Adult Aspirin Low Strength 81 Mg Tbdp (Aspirin) .... Once daily 3)  Vitamin D (ergocalciferol) 50000 Unit Caps (Ergocalciferol) .Marland Kitchen.. 1 by mouth weekly 4)  Lisinopril 20 Mg Tabs (Lisinopril) .Marland Kitchen.. 1 by mouth once daily 5)  Potassium Chloride Cr 10 Meq Cr-tabs (Potassium chloride) .... 3  by mouth once daily 6)  Bystolic 10 Mg Tabs (Nebivolol hcl) .... Take 1/2 by mouth once daily  may increase to 1 by mouth once daily  Hypertension Assessment/Plan:      The patient's hypertensive risk group is category B: At least one risk factor (excluding diabetes) with no target organ damage.  Her calculated 10 year risk of coronary heart disease is 15 %.  Today's blood pressure is 170/100.  Her blood pressure goal is < 140/90.  Patient Instructions: 1)  increase the bystolic  to 10 mg per day  and continue on the lisinopril and  potassium.    for now.  2)  return office visit in another month with readings  .

## 2010-04-01 NOTE — Letter (Signed)
[facility]   Imported By: Maryln Gottron 07/10/2009 15:15:44  _____________________________________________________________________  External Attachment:    Type:   Image     Comment:   External Document

## 2010-04-01 NOTE — Letter (Signed)
Summary: [facility]  [facility]   Imported By: Maryln Gottron 12/04/2009 10:35:30  _____________________________________________________________________  External Attachment:    Type:   Image     Comment:   External Document

## 2010-04-01 NOTE — Assessment & Plan Note (Signed)
Summary: BP follow-up visit   Vital Signs:  Patient profile:   60 year old female Menstrual status:  postmenopausal Weight:      185 pounds BMI:     25.90 Temp:     98.1 degrees F oral Pulse rate:   78 / minute Pulse rhythm:   regular BP sitting:   146 / 102  (left arm) Cuff size:   regular  Vitals Entered By: Raechel Ache, RN (September 03, 2009 8:48 AM)  Serial Vital Signs/Assessments:  Time      Position  BP       Pulse  Resp  Temp     By                     16/10                          Madelin Headings MD                     140/96                         Madelin Headings MD                     122/84                         Madelin Headings MD  Comments: lef tarm reg By: Madelin Headings MD  reg cuff stting By: Madelin Headings MD  large cuff sitting left.  By: Madelin Headings MD   CC: F/u on BP. C/o headaches.   History of Present Illness: Alexandra [name] comes in today  for follow up of uncontrolled BP readings .  Since last visit she has taken the amlodipine 5 mg per day in addition to the 20 of bystolic.  HEr bp readings at home are coming down closer to normal.   BP. over 149/94 adn 139 /84.   at home. NO edema  sob.  some HAs . Lower readings also  at dr Emilee Hero office .       Preventive Screening-Counseling & Management  Alcohol-Tobacco     Alcohol drinks/day: 0     Smoking Status: never  Caffeine-Diet-Exercise     Caffeine use/day: 0     Does Patient Exercise: yes     Type of exercise: walking     Times/week: 5   Allergies: No Known Drug Allergies  Past History:  Past medical, surgical, family and social histories (including risk factors) reviewed for relevance to current acute and chronic problems.  Past Medical History: Thrombocytosis under rx   hydroxyurea  UTI's Hypertension Echo   Past Surgical History: Reviewed history from 08/30/2008 and no changes required. Surgery for Tubal Pregnancy '84 G4 P2 Bone Marrow Biopsy  Family  History: Reviewed history from 08/30/2008 and no changes required. Family History of Arthritis Family History Diabetes 1st degree relative Family History Hypertension Family History Kidney disease Family History of Stroke M 1st degree relative <50 Father: massive MI   76  Mother: Died of  heart infection    30..  dialysis .   Social History: Reviewed history from 08/30/2008 and no changes required. 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    Physical Exam  General:  Well-developed,well-nourished,in no acute distress; alert,appropriate and cooperative throughout examination Head:  normocephalic and atraumatic.   Lungs:  normal respiratory effort and no intercostal retractions.   Heart:  normal rate and regular rhythm.   see Bp readings  Extremities:  no clubbing cyanosis or edema  Cervical Nodes:  No lymphadenopathy noted Psych:  Oriented X3, normally interactive, good eye contact, and not depressed appearing.  minimally anxious    Impression & Recommendations:  Problem # 1:  HYPERTENSION (ICD-401.9)  much better     now on triple therapy for a month   ... To continue   same med and rov in 1 month and if doing well   can rov  to less frequent checks   . She has been shesitant and cautious with  meds and rx   and initially resistant to rx but nowseem to be more comfortable with meds and plan.     REc one more montly follow up until  estalished control and then  q 6 months follow up or as needed. Her updated medication list for this problem includes:    Lisinopril 20 Mg Tabs (Lisinopril) .Marland Kitchen... 1 by mouth once daily    Bystolic 20 Mg Tabs (Nebivolol hcl) .Marland Kitchen... 1 by mouth once daily    Amlodipine Besylate 5 Mg Tabs (Amlodipine besylate) .Marland Kitchen... 1 by mouth once daily  for high blood pressure  BP today:  146/102  and 140/96 and 124/84  large cuff  Prior BP: 162/100 (07/24/2009)  Prior 10 Yr Risk Heart Disease: 15 % (07/24/2009)  Labs Reviewed: K+: 3.3 (01/29/2009) Creat: : 0.8 (01/29/2009)   Chol: 182 (07/24/2008)   HDL: 45.00 (07/24/2008)   LDL: 126 (07/24/2008)   TG: 56.0 (07/24/2008)  Problem # 2:  THROMBOCYTHEMIA (ICD-238.71) Assessment: Comment Only  Complete Medication List: 1)  Hydroxyurea 500 Mg Caps (Hydroxyurea) .... 3 times a day 2)  Adult Aspirin Low Strength 81 Mg Tbdp (Aspirin) .... Once daily 3)  Lisinopril 20 Mg Tabs (Lisinopril) .Marland Kitchen.. 1 by mouth once daily 4)  Bystolic 20 Mg Tabs (Nebivolol hcl) .Marland Kitchen.. 1 by mouth once daily 5)  Amlodipine Besylate 5 Mg Tabs (Amlodipine besylate) .Marland Kitchen.. 1 by mouth once daily  for high blood pressure 6)  Vitamin D3 1000 Unit Caps (Cholecalciferol)  Patient Instructions: 1)  continue on same medications   2)  ROV  in a month   or as needed.

## 2010-04-01 NOTE — Progress Notes (Signed)
Summary: samples  Phone Note Call from Patient   Caller: Patient Call For: Madelin Headings MD Summary of Call: Pt is asking for samples of Bystolic 20 mg. or 10 mg. [phone] Initial call taken by: Lynann Beaver CMA,  August 30, 2009 10:26 AM  Follow-up for Phone Call        please give her some.  she has appt next week Follow-up by: Madelin Headings MD,  August 30, 2009 12:14 PM  Additional Follow-up for Phone Call Additional follow up Details #1::        Pt aware that samples are ready to pick up. Additional Follow-up by: Romualdo Bolk, CMA (AAMA),  August 30, 2009 12:51 PM

## 2010-04-01 NOTE — Assessment & Plan Note (Signed)
Summary: follow up/ssc   Vital Signs:  Patient profile:   60 year old female Menstrual status:  postmenopausal Weight:      188 pounds Pulse rate:   60 / minute BP sitting:   150 / 90  (right arm) Cuff size:   regular  Vitals Entered By: Romualdo Bolk, CMA (AAMA) (October 29, 2009 11:43 AM)  Serial Vital Signs/Assessments:  Time      Position  BP       Pulse  Resp  Temp     By                     150/84                         Madelin Headings MD                     140/82                         Madelin Headings MD  Comments: large left  By: Madelin Headings MD  reg left sitting  By: Madelin Headings MD   CC: Follow-up visit on bp, Hypertension Management   History of Present Illness: Alexandra [name] comes in today  for follow up of HT . Since last visit she feels well and her readings at ome are usually in range . this am 140/95 and then 137/80.  her machine is reading error in office   Sleeps well. no se of meds .  no current cp sob   or bleeding signs .  No new symptoms     Hypertension History:      She complains of peripheral edema, but denies headache, chest pain, palpitations, dyspnea with exertion, orthopnea, PND, visual symptoms, neurologic problems, syncope, and side effects from treatment.  She notes no problems with any antihypertensive medication side effects.        Positive major cardiovascular risk factors include female age 2 years old or older and hypertension.  Negative major cardiovascular risk factors include non-tobacco-user status.     Preventive Screening-Counseling & Management  Alcohol-Tobacco     Alcohol drinks/day: 0     Smoking Status: never  Caffeine-Diet-Exercise     Caffeine use/day: 0     Does Patient Exercise: yes     Type of exercise: walking     Times/week: 5   Current Medications (verified): 1)  Hydroxyurea 500 Mg Caps (Hydroxyurea) .... 3 Times A Day 2)  Adult Aspirin Low Strength 81 Mg  Tbdp (Aspirin) .... Once Daily 3)   Lisinopril 20 Mg Tabs (Lisinopril) .Marland Kitchen.. 1 By Mouth Once Daily 4)  Bystolic 20 Mg Tabs (Nebivolol Hcl) .Marland Kitchen.. 1 By Mouth Once Daily 5)  Amlodipine Besylate 5 Mg Tabs (Amlodipine Besylate) .Marland Kitchen.. 1 By Mouth Once Daily  For High Blood Pressure 6)  Vitamin D3 1000 Unit Caps (Cholecalciferol)  Allergies (verified): No Known Drug Allergies  Past History:  Past medical, surgical, family and social histories (including risk factors) reviewed, and no changes noted (except as noted below).  Past Medical History: Reviewed history from 09/03/2009 and no changes required. Thrombocytosis under rx   hydroxyurea  UTI's Hypertension Echo   Past Surgical History: Reviewed history from 08/30/2008 and no changes required. Surgery for Tubal Pregnancy '84 G4 P2 Bone Marrow Biopsy  Past History:  Care Management: Hematology/Oncology: [facility] Cardiology: [facility]  Family History: Reviewed history from 08/30/2008 and no changes required. Family History of Arthritis Family History Diabetes 1st degree relative Family History Hypertension Family History Kidney disease Family History of Stroke M 1st degree relative <50 Father: massive MI   69  Mother: Died of  heart infection    63..  dialysis .   Social History: Reviewed history from 08/30/2008 and no changes required. Occupation: Office Married Never Smoked Alcohol use-no Drug use-no Regular exercise-no hh of 4    pets no    Review of Systems  The patient denies prolonged cough, abnormal bleeding, and angioedema.         slight weight gain  no  sig edema  minimal   Physical Exam  General:  alert and well-developed.   Lungs:  normal respiratory effort, no intercostal retractions, and no accessory muscle use.   Heart:  normal rate, regular rhythm, and no murmur.  see bp readings  Pulses:  pulses intact without delay   Extremities:  no clubbing cyanosis or edema to trace  Neurologic:  alert & oriented X3 and gait normal.  non focal    Skin:  turgor normal, color normal, no ecchymoses, and no petechiae.   Cervical Nodes:  No lymphadenopathy noted Psych:  Oriented X3, normally interactive, good eye contact, and not depressed appearing.  not anxious appearing today    Impression & Recommendations:  Problem # 1:  HYPERTENSION (ICD-401.9) although initial reading up here   ....  reading at home pretty much controlled and no se of meds seen ( doubt weight gain an issues)     repeated readings in office  are at goal  and show some white coat effect .   rx bystolic and co pay card given  with sample   because we finally seem to be at goal and dtolerating meds well will c ontinue and follow up but she is to call if  worsening .   of not reading up to 150-160  in Dr Tama Gander office  Her updated medication list for this problem includes:    Lisinopril 20 Mg Tabs (Lisinopril) .Marland Kitchen... 1 by mouth once daily    Bystolic 20 Mg Tabs (Nebivolol hcl) .Marland Kitchen... 1 by mouth once daily    Amlodipine Besylate 5 Mg Tabs (Amlodipine besylate) .Marland Kitchen... 1 by mouth once daily  for high blood pressure  Problem # 2:  THROMBOCYTHEMIA (ICD-238.71) stable   under rx .   Problem # 3:  ABNORMAL ELECTROCARDIOGRAM (ICD-794.31) no  signs  at some point we were going to do a stress test but delayed because fo ht  .   will consider  revisiting this.   Complete Medication List: 1)  Hydroxyurea 500 Mg Caps (Hydroxyurea) .... 3 times a day 2)  Adult Aspirin Low Strength 81 Mg Tbdp (Aspirin) .... Once daily 3)  Lisinopril 20 Mg Tabs (Lisinopril) .Marland Kitchen.. 1 by mouth once daily 4)  Bystolic 20 Mg Tabs (Nebivolol hcl) .Marland Kitchen.. 1 by mouth once daily 5)  Amlodipine Besylate 5 Mg Tabs (Amlodipine besylate) .Marland Kitchen.. 1 by mouth once daily  for high blood pressure 6)  Vitamin D3 1000 Unit Caps (Cholecalciferol) 7)  Bystolic 20 Mg Tabs (Nebivolol hcl) .Marland Kitchen.. 1 by mouth once daily  Hypertension Assessment/Plan:      The patient's hypertensive risk group is category B: At least one risk factor  (excluding diabetes) with no target organ damage.  Her calculated 10 year risk of coronary heart disease is 13 %.  Today's blood  pressure is 150/90.  Her blood pressure goal is < 140/90.  Contraindications/Deferment of Procedures/Staging:    Test/Procedure: FLU VAX    Reason for deferment: patient declined   Patient Instructions: 1)  continue monitoring BP readings  2)  Check your  Blood Pressure regularly . if average constantly above 140/90 call 3)  bring your machine to the next visit... working.  4)  preventive visit cpx with labs in November/ December 2011. Prescriptions: BYSTOLIC 20 MG TABS (NEBIVOLOL HCL) 1 by mouth once daily  #30 x 6   Entered and Authorized by:   Madelin Headings MD   Signed by:   Madelin Headings MD on 10/29/2009   Method used:   Print then Give to Patient   RxID:   [id number]

## 2010-04-01 NOTE — Letter (Signed)
[facility]   Imported By: Maryln Gottron 10/09/2009 13:12:27  _____________________________________________________________________  External Attachment:    Type:   Image     Comment:   External Document

## 2010-04-01 NOTE — Progress Notes (Signed)
Summary: bystolic samples not available  Phone Note Call from Patient Call back at [phone]? [phone] on phonepad   Caller: vm Summary of Call: Samples Bystolic 20mg .  Any available 20 or 10mg  size Initial call taken by: Rudy Jew, RN,  November 18, 2009 2:17 PM  Follow-up for Phone Call        ok to give samples if we have them  Follow-up by: Madelin Headings MD,  November 19, 2009 5:51 AM  Additional Follow-up for Phone Call Additional follow up Details #1::        None available.  [phone], not there yet.  Call back 15 min.  [phone] not identified.   Patient advised.  She does have Rx to use.   Additional Follow-up by: Rudy Jew, RN,  November 20, 2009 9:15 AM

## 2010-04-01 NOTE — Progress Notes (Signed)
Summary: Bystolic samples, OV scheduled  Phone Note Call from Patient Call back at Work Phone [phone]   Caller: Patient Call For: Alexandra Headings MD Summary of Call: Pt calling requesting additional samples of Bystolic 20mg .  Gave pt 2 bottles of 10mg  (all we had) and she understands to take 2 daily.  Scheduled F/U visit on 7/5 to recheck BP.  Offered 5mg  samples, she would need to take 4 daily, pt declined.  She will call back for more samples next week 20mg  of 10mg . if available. FYI Initial call taken by: Sid Falcon LPN,  August 22, 2009 9:47 AM

## 2010-04-01 NOTE — Assessment & Plan Note (Signed)
Summary: follow up on bp- Pt to bring in bp cuff with reading/ssc   Vital Signs:  Patient profile:   60 year old female Menstrual status:  postmenopausal Weight:      184 pounds Pulse rate:   74 / minute BP sitting:   180 / 96  (left arm) Cuff size:   regular  Vitals Entered By: Romualdo Bolk, CMA (AAMA) (June 25, 2009 3:01 PM)  Serial Vital Signs/Assessments:  Time      Position  BP       Pulse  Resp  Temp     By 3:05 PM             168/103                        Romualdo Bolk, CMA (AAMA)           Lying RA  158/88                         Madelin Headings MD           Sitting   146/84                         Madelin Headings MD  Comments: 3:05 PM Pt's machine By: Romualdo Bolk, CMA (AAMA)   CC: Follow-up visit on blood pressure, Hypertension Management   History of Present Illness: Rosaleigh Brazzel comesin comes in today  for elvated BP readings  Unfortunaelty for whatever reason she has not come in on a monthly basis to get some control of her BP .See Note Dr Caprice Red  about getting bp under control . She was last seen in january and supposed to come back in a month but did not until sent by her oncologist and asking for samples to follow up .   Her Bp readings at that visit showed  initially 190 and 150 on repeat.She has been good about taking med regularly.  She feels ok wo cp sob or sig HA .  No se of meds  on bystolic.  Hypertension History:      She complains of headache and peripheral edema, but denies chest pain, palpitations, dyspnea with exertion, orthopnea, PND, visual symptoms, neurologic problems, syncope, and side effects from treatment.  She notes no problems with any antihypertensive medication side effects.        Positive major cardiovascular risk factors include female age 74 years old or older and hypertension.  Negative major cardiovascular risk factors include non-tobacco-user status.     Preventive Screening-Counseling &  Management  Alcohol-Tobacco     Alcohol drinks/day: 0     Smoking Status: never  Caffeine-Diet-Exercise     Caffeine use/day: 2-3     Does Patient Exercise: yes     Type of exercise: walking     Times/week: 5   Current Medications (verified): 1)  Hydroxyurea 500 Mg Caps (Hydroxyurea) .... 3 Times A Day 2)  Adult Aspirin Low Strength 81 Mg  Tbdp (Aspirin) .... Once Daily 3)  Lisinopril 20 Mg Tabs (Lisinopril) .Marland Kitchen.. 1 By Mouth Once Daily 4)  Potassium Chloride Cr 10 Meq Cr-Tabs (Potassium Chloride) .... 3  By Mouth Once Daily 5)  Bystolic 20 Mg Tabs (Nebivolol Hcl) .Marland Kitchen.. 1 By Mouth Once Daily  Allergies (verified): No Known Drug Allergies  Past History:  Past medical, surgical, family and social histories (  including risk factors) reviewed, and no changes noted (except as noted below).  Past Medical History: Reviewed history from 03/06/2009 and no changes required. Thrombocytosis under rx  UTI's Hypertension Echo   Past Surgical History: Reviewed history from 08/30/2008 and no changes required. Surgery for Tubal Pregnancy '84 G4 P2 Bone Marrow Biopsy  Past History:  Care Management: Hematology/Oncology: [facility] Cardiology: [facility]  Family History: Reviewed history from 08/30/2008 and no changes required. Family History of Arthritis Family History Diabetes 1st degree relative Family History Hypertension Family History Kidney disease Family History of Stroke M 1st degree relative <50 Father: massive MI   61  Mother: Died of  heart infection    57..  dialysis .   Social History: Reviewed history from 08/30/2008 and no changes required. Occupation: Office Married Never Smoked Alcohol use-no Drug use-no Regular exercise-no hh of 4    pets no    Review of Systems  The patient denies anorexia, fever, weight loss, weight gain, chest pain, syncope, dyspnea on exertion, prolonged cough, abdominal pain, melena, hematochezia, severe indigestion/heartburn, hematuria,  difficulty walking, unusual weight change, abnormal bleeding, enlarged lymph nodes, and angioedema.    Physical Exam  General:  Well-developed,well-nourished,in no acute distress; alert,appropriate and cooperative throughout examination Head:  normocephalic and atraumatic.   Eyes:  vision grossly intact.   Neck:  No deformities, masses, or tenderness noted. Lungs:  Normal respiratory effort, chest expands symmetrically. Lungs are clear to auscultation, no crackles or wheezes. Heart:  Normal rate and regular rhythm. S1 and S2 normal without gallop, murmur, click, rub or other extra sounds. Abdomen:  soft, non-tender, no masses, no guarding, no hepatomegaly, and no splenomegaly.   Pulses:  pulses intact without delay   Extremities:  no clubbing cyanosis or edema  Skin:  turgor normal, color normal, and no petechiae.   Cervical Nodes:  No lymphadenopathy noted Psych:  Oriented X3, normally interactive, good eye contact, not anxious appearing, and not depressed appearing.     Impression & Recommendations:  Problem # 1:  HYPERTENSION (ICD-401.9)  erratic follow up and very hard to manage  because  of great lapses in times of follow up. After calling oncology office at visit  related that her    Las t potassium levele and normal.   consider other evaluation for secondary ht but  need  more consitent   follow up first ( got sig hypokalemia on diuretic)  was 4.) in february off of potassium supp. Her updated medication list for this problem includes:    Lisinopril 20 Mg Tabs (Lisinopril) .Marland Kitchen... 1 by mouth once daily    Bystolic 20 Mg Tabs (Nebivolol hcl) .Marland Kitchen... 1 by mouth once daily    Amlodipine Besylate 5 Mg Tabs (Amlodipine besylate) .Marland Kitchen... 1 by mouth once daily  for high blood pressure  Problem # 2:  THROMBOCYTHEMIA (ICD-238.71) Assessment: Comment Only JAK 2 negative  on hydroxyurea.  Problem # 3:  ABNORMAL ELECTROCARDIOGRAM (ICD-794.31) see note DR Hochrein    hasnt gotten a stress test  because of her  out of control ht .   ( see note)  rec get Bp controlled and then follow up wth cards.  Complete Medication List: 1)  Hydroxyurea 500 Mg Caps (Hydroxyurea) .... 3 times a day 2)  Adult Aspirin Low Strength 81 Mg Tbdp (Aspirin) .... Once daily 3)  Lisinopril 20 Mg Tabs (Lisinopril) .Marland Kitchen.. 1 by mouth once daily 4)  Potassium Chloride Cr 10 Meq Cr-tabs (Potassium chloride) .... 3  by mouth  once daily 5)  Bystolic 20 Mg Tabs (Nebivolol hcl) .Marland Kitchen.. 1 by mouth once daily 6)  Amlodipine Besylate 5 Mg Tabs (Amlodipine besylate) .Marland Kitchen.. 1 by mouth once daily  for high blood pressure  Hypertension Assessment/Plan:      The patient's hypertensive risk group is category B: At least one risk factor (excluding diabetes) with no target organ damage.  Her calculated 10 year risk of coronary heart disease is 13 %.  Today's blood pressure is 180/96.  Her blood pressure goal is < 140/90.  Patient Instructions: 1)  ok to take  vitamin d  [phone] international units per day  2)  Continue the 20 mg of bystolic  and continue the  lisinopril   fo rnow  3)  rov in 2-3 weeks and then we may add  amlodipine.  Prescriptions: AMLODIPINE BESYLATE 5 MG TABS (AMLODIPINE BESYLATE) 1 by mouth once daily  for high blood pressure  #30 x 2   Entered and Authorized by:   Madelin Headings MD   Signed by:   Madelin Headings MD on 06/25/2009   Method used:   Print then Give to Patient   RxID:   [id number]

## 2010-04-01 NOTE — Progress Notes (Signed)
Summary: samples of bystolic  Phone Note Call from Patient Call back at Home Phone [phone]   Caller: Patient Summary of Call: Pt needs samples of bystolic 10mg  Initial call taken by: Romualdo Bolk, CMA (AAMA),  April 15, 2009 3:38 PM  Follow-up for Phone Call        ok to do    until we decide on proper HT regimen. Follow-up by: Madelin Headings MD,  April 15, 2009 4:55 PM  Additional Follow-up for Phone Call Additional follow up Details #1::        Left message on machine that samples are ready to pick up. Additional Follow-up by: Romualdo Bolk, CMA (AAMA),  April 15, 2009 5:13 PM

## 2010-04-01 NOTE — Letter (Signed)
[facility]   Imported By: Maryln Gottron 05/10/2009 10:12:58  _____________________________________________________________________  External Attachment:    Type:   Image     Comment:   External Document

## 2010-04-01 NOTE — Progress Notes (Signed)
Summary: bystolic  Phone Note Call from Patient Call back at [facility] Phone [phone] Call back at Work Phone [phone]   Summary of Call: Bystolic 10mg  sample requested.  Is on the last bottle given to her.  If no samples,  Rx to [facility] [address].   Initial call taken by: Rudy Jew, RN,  June 10, 2009 4:56 PM  Follow-up for Phone Call        ok to give her sample s  and  rx disp 30 refill x 3  please have her schedule rov with record of BP readings  within the month Follow-up by: Madelin Headings MD,  June 10, 2009 5:08 PM  Additional Follow-up for Phone Call Additional follow up Details #1::        Left message to call back. Samples up front and rx sent electronically. Additional Follow-up by: Romualdo Bolk, CMA Duncan Dull),  June 10, 2009 5:15 PM    Additional Follow-up for Phone Call Additional follow up Details #2::    LMTOCB at home- Spoke with pt at work and she is going to increase bp medications. See phone note from 3/13. Appt made. Follow-up by: Romualdo Bolk, CMA (AAMA),  June 13, 2009 10:03 AM  Prescriptions: BYSTOLIC 10 MG TABS (NEBIVOLOL HCL) take 1/2 by mouth once daily  may increase to 1 by mouth once daily  #30 x 3   Entered by:   Romualdo Bolk, CMA (AAMA)   Authorized by:   Madelin Headings MD   Signed by:   Romualdo Bolk, CMA (AAMA) on 06/10/2009   Method used:   Electronically to        [id number] (retail)       [address] [place], [place]  [address]       Ph: [phone]       Fax: [phone]   RxID:   [id number]

## 2010-04-03 NOTE — Letter (Signed)
Summary: [facility]  [facility]   Imported By: Maryln Gottron 02/11/2010 10:29:32  _____________________________________________________________________  External Attachment:    Type:   Image     Comment:   External Document

## 2010-04-03 NOTE — Progress Notes (Signed)
Summary: med refill  Phone Note Refill Request Message from:  Patient  Refills Requested: Medication #1:  AMLODIPINE BESYLATE 5 MG TABS 1 by mouth once daily  for high blood pressure pt needs refill call into [facility] [address] [phone]  Initial call taken by: Heron Sabins,  February 27, 2010 2:36 PM  Follow-up for Phone Call        Rx sent to pharmacy Follow-up by: Romualdo Bolk, CMA Duncan Dull),  February 27, 2010 2:38 PM    Prescriptions: AMLODIPINE BESYLATE 5 MG TABS (AMLODIPINE BESYLATE) 1 by mouth once daily  for high blood pressure  #30 Each x 1   Entered by:   Romualdo Bolk, CMA (AAMA)   Authorized by:   Madelin Headings MD   Signed by:   Romualdo Bolk, CMA (AAMA) on 02/27/2010   Method used:   Electronically to        [facility] [address] (retail)       [address]       [place], [place]  [address]       Ph: [phone]       Fax: [phone]   RxID:   [id number]

## 2010-04-03 NOTE — Assessment & Plan Note (Signed)
Summary: cpx w/pap//ccm   Vital Signs:  Patient profile:   60 year old female Menstrual status:  postmenopausal Height:      70 inches Weight:      184 pounds Pulse rate:   72 / minute BP sitting:   140 / 100  (right arm) Cuff size:   regular  Vitals Entered By: Romualdo Bolk, CMA (AAMA) (February 25, 2010 10:40 AM)  Serial Vital Signs/Assessments:  Time      Position  BP       Pulse  Resp  Temp     By 10:44 AM            152/95   79                    Romualdo Bolk, CMA (AAMA)  Comments: 10:44 AM Pt's machine By: Romualdo Bolk, CMA (AAMA)   CC: CPX with pap   History of Present Illness: Alexandra [name] comes in today  for preventive visit . Since last visit  here  there have been no major changes in health status  . Ongoing problems: EA:VWUJW taking the 3 meds     noeted some swelling in legs when dependent .  NO cv pulm symptoms noted ,.  sleeps well  Bp readings are 143/90 to ocass 125/86  NO cp sob or change in exercise tolerance .  Thrombocytosis :   sees Dr Drue Dun in [facility] . No bleeding orprblem with meds     Preventive Care Screening  Prior Values:    Pap Smear:  NEGATIVE FOR INTRAEPITHELIAL LESIONS OR MALIGNANCY. (07/31/2008)    Mammogram:  ASSESSMENT: Negative - BI-RADS 1^MM DIGITAL SCREENING (07/08/2009)   Contraindications/Deferment of Procedures/Staging:    Test/Procedure: TD vaccine    Reason for deferment: declined     Test/Procedure: Colonoscopy    Reason for deferment: patient declined   Preventive Screening-Counseling & Management  Alcohol-Tobacco     Alcohol drinks/day: 0     Smoking Status: never  Caffeine-Diet-Exercise     Caffeine use/day: 0     Does Patient Exercise: yes     Type of exercise: walking     Times/week: 5   Hep-HIV-STD-Contraception     Dental Visit-last 6 months yes     Sun Exposure-Excessive: yes  Safety-Violence-Falls     Seat Belt Use: yes     Firearms in the Home: no firearms in the home    Smoke Detectors: yes     Fall Risk: no   Current Medications (verified): 1)  Hydroxyurea 500 Mg Caps (Hydroxyurea) .... 3 Times A Day 2)  Adult Aspirin Low Strength 81 Mg  Tbdp (Aspirin) .... Once Daily 3)  Lisinopril 20 Mg Tabs (Lisinopril) .Marland Kitchen.. 1 By Mouth Once Daily 4)  Amlodipine Besylate 5 Mg Tabs (Amlodipine Besylate) .Marland Kitchen.. 1 By Mouth Once Daily  For High Blood Pressure 5)  Bystolic 20 Mg Tabs (Nebivolol Hcl) .Marland Kitchen.. 1 By Mouth Once Daily 6)  Multivitamins   Tabs (Multiple Vitamin)  Allergies (verified): No Known Drug Allergies  Past History:  Past medical, surgical, family and social histories (including risk factors) reviewed, and no changes noted (except as noted below).  Past Medical History: Reviewed history from 09/03/2009 and no changes required. Thrombocytosis under rx   hydroxyurea  UTI's Hypertension Echo   Past Surgical History: Reviewed history from 08/30/2008 and no changes required. Surgery for Tubal Pregnancy '84 G4 P2 Bone Marrow Biopsy  Past History:  Care Management:  Hematology/Oncology: Monika Salk Cardiology: Hochrein  Family History: Reviewed history from 08/30/2008 and no changes required. Family History of Arthritis Family History Diabetes 1st degree relative Family History Hypertension Family History Kidney disease Family History of Stroke M 1st degree relative <50 Father: massive MI   73  Mother: Died of  heart infection    74..  dialysis .  4/6 sisters have hypertension  one bgan meds in her 10s   Social History: Reviewed history from 08/30/2008 and no changes required. 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         neg eye ear Gi GU  CV pulm   ortho currently   has mild edema feeling  when legs down at [place] since being on the amlodipine.   Physical Exam  General:  Well-developed,well-nourished,in no acute distress; alert,appropriate and cooperative throughout examination Head:  normocephalic and atraumatic.   Eyes:  PERRL, EOMs full, conjunctiva clear  Ears:  R ear normal, L ear normal, and no external deformities.   Nose:  no external deformity, no external erythema, and no nasal discharge.   Mouth:  good dentition and pharynx pink and moist.   Neck:  No deformities, masses, or tenderness noted. Breasts:  No mass, nodules, thickening, tenderness, bulging, retraction, inflamation, nipple discharge or skin changes noted.   Lungs:  Normal respiratory effort, chest expands symmetrically. Lungs are clear to auscultation, no crackles or wheezes.no dullness.   Heart:  Normal rate and regular rhythm. S1 and S2 normal without gallop, murmur, click, rub or other extra sounds.no lifts.   Abdomen:  Bowel sounds positive,abdomen soft and non-tender without masses, organomegaly or hernias noted. Rectal:  No external abnormalities noted. Normal sphincter tone. No rectal masses or tenderness. Genitalia:  Pelvic Exam:        External: normal female genitalia without lesions or masses        Vagina: normal without lesions or masses        Cervix: normal without lesions or masses        Adnexa: normal bimanual exam without masses or fullness        Uterus: normal by palpation        Pap smear: performed Msk:  no joint warmth, no redness over joints, and no joint deformities.   Pulses:  pulses intact without delay   Extremities:  no clubbing cyanosis  poss   trace edema ankles  minimal      Neurologic:  alert & oriented X3, strength normal in all extremities, gait normal, and DTRs symmetrical and normal.   Skin:  turgor normal, color normal, no ecchymoses, and no petechiae.   Cervical Nodes:  No lymphadenopathy noted Axillary Nodes:  No palpable lymphadenopathy Inguinal Nodes:  No significant adenopathy Psych:  Oriented X3, memory intact for recent and remote, normally interactive, and not depressed appearing.     Impression & Recommendations:  Problem # 1:  Preventive Health Care (ICD-V70.0) Discussed nutrition,exercise,diet,healthy weight, vitamin D and calcium.    Problem # 2:  ROUTINE  GYNECOLOGICAL EXAM (ICD-V72.31)  pap done   Orders: Pap Smear, Thin Prep ( Collection of) ([id number])  Problem # 3:  HYPERTENSION (ICD-401.9) Assessment: Improved still not at goal       disc options.  will increaset lisinopril to 40 to see if helps the minimal edema with the CCCB  and follow up  can get labs at next visit if ok.  The following medications were removed from the medication list:    Bystolic 20 Mg Tabs (Nebivolol hcl) .Marland Kitchen... 1 by mouth once daily Her updated medication list for this problem includes:    Lisinopril 20 Mg Tabs (Lisinopril) .Marland Kitchen... 2  by mouth once daily    Amlodipine Besylate 5 Mg Tabs (Amlodipine besylate) .Marland Kitchen... 1 by mouth once daily  for high blood pressure    Bystolic 20 Mg Tabs (Nebivolol hcl) .Marland Kitchen... 1 by mouth once daily  Problem # 4:  HYPERGLYCEMIA (ICD-790.29) counseled  Labs Reviewed: Creat: 0.6 (02/18/2010)     Problem # 5:  THROMBOCYTHEMIA (ICD-238.71) Assessment: Comment Only  Complete Medication List: 1)  Hydroxyurea 500 Mg Caps (Hydroxyurea) .... 3 times a day 2)  Adult Aspirin Low Strength 81 Mg Tbdp (Aspirin) .... Once daily 3)  Lisinopril 20 Mg Tabs (Lisinopril) .... 2  by mouth once daily 4)  Amlodipine Besylate 5 Mg Tabs (Amlodipine besylate) .Marland Kitchen.. 1 by mouth once daily  for high blood pressure 5)  Bystolic 20 Mg Tabs (Nebivolol hcl)  .Marland Kitchen.. 1 by mouth once daily 6)  Multivitamins Tabs (Multiple vitamin)  Patient Instructions: 1)  increase lisinopril to 40 mg per day  2)  ROV   in 2 months and see if edema is better . and BP control.  3)  avoid simple sugars and carbs. for elevated BG readings.  4)  will send copy of labs to Dr Myna Hidalgo. 5)  Get colonoscopy . 6)  You will be informed of lab PAP results when available.  Prescriptions: LISINOPRIL 20 MG TABS (LISINOPRIL) 2  by mouth once daily  #60 x 4   Entered and Authorized by:   Madelin Headings MD   Signed by:   Madelin Headings MD on 02/25/2010   Method used:   Electronically to        [facility] (retail)       [address]       [place], [place]  [address]       Ph: [phone]       Fax: [phone]   RxID:   [id number]    Orders Added: 1)  Est. Patient 40-64 years [99396] 2)  Est. Patient Level III [03474] 3)  Pap Smear, Thin Prep ( Collection of) [Q0091]

## 2010-04-07 ENCOUNTER — Encounter: Payer: Self-pay | Admitting: Internal Medicine

## 2010-04-29 ENCOUNTER — Ambulatory Visit (INDEPENDENT_AMBULATORY_CARE_PROVIDER_SITE_OTHER): Payer: BC Managed Care – PPO | Admitting: Internal Medicine

## 2010-04-29 ENCOUNTER — Encounter: Payer: Self-pay | Admitting: Internal Medicine

## 2010-04-29 VITALS — BP 136/88 | HR 80 | Temp 98.2°F | Resp 14 | Ht 71.0 in | Wt 185.0 lb

## 2010-04-29 DIAGNOSIS — I10 Essential (primary) hypertension: Secondary | ICD-10-CM

## 2010-04-29 DIAGNOSIS — D473 Essential (hemorrhagic) thrombocythemia: Secondary | ICD-10-CM

## 2010-04-29 DIAGNOSIS — R51 Headache: Secondary | ICD-10-CM

## 2010-04-29 NOTE — Progress Notes (Signed)
  Subjective:    Patient ID: Alexandra [name], female    DOB: [birth year], 60 y.o.   MRN: [id number]  HPI the patient comesin today for follow up of her HT.  Since last visit she has increased her lisinopril and seems to have good readings at home. 127 and 122   /96  Coming down.      Some Headaches  .   Not as often    Usually getting at   Mid morning.   Last  30 45 minutes.  Taking BC Powder.    ocass  Off an on.   3-4 doses in 7 days  . NO vision changes numbness NVD.   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    Review of Systems Neg cp sob numbness new Gi or gu isses    vision changes .      Objective:   Physical Exam WDWN in nad looks well. HEENT: grossly nl    Neck no bruits or jvd. Chest:  Clear to A&P without wheezes rales or rhonchi CV:  S1-S2 no gallops or murmurs peripheral perfusion is normal  BP readings right  Large cuff 130/88        Assessment & Plan:  HT  Has been difficult to get the right combination.   Consider  k sparing diuretic combo if needed an careful check of K  To see if gets more optimum effect . She has a strong fam hx of ht and  also white coat that is getting better.  Labs  Order to fax to dr Tama Gander office as she is due for labs soon. HA  :  Unsure if from meds  Or rebounding or both .   Calendar your  HAs.  More than 50% of visit  Was spent in counseling  25

## 2010-04-29 NOTE — Patient Instructions (Addendum)
Continue meds and monitor your bp readings. HA calendar and minimize med use . Labs BMP in the next  Blood draw from Dr Myna Hidalgo   .   return office visit in 3 months or as needed.

## 2010-05-02 ENCOUNTER — Other Ambulatory Visit: Payer: Self-pay | Admitting: Internal Medicine

## 2010-05-04 ENCOUNTER — Encounter: Payer: Self-pay | Admitting: Internal Medicine

## 2010-05-04 DIAGNOSIS — R51 Headache: Secondary | ICD-10-CM | POA: Insufficient documentation

## 2010-05-04 DIAGNOSIS — R519 Headache, unspecified: Secondary | ICD-10-CM | POA: Insufficient documentation

## 2010-05-04 NOTE — Assessment & Plan Note (Signed)
Continue same meds as is getting better   . Consider trying to decrease her pill count eventually  . Reviewed  Dietary changes that could also help.

## 2010-05-04 NOTE — Assessment & Plan Note (Signed)
Avoid  otc meds and calendar  To avoid rebound and effect on bp.    Will do this .Bystolic could cause HA but doesn't seem related.

## 2010-05-08 ENCOUNTER — Encounter (HOSPITAL_BASED_OUTPATIENT_CLINIC_OR_DEPARTMENT_OTHER): Payer: BC Managed Care – PPO | Admitting: Hematology & Oncology

## 2010-05-08 ENCOUNTER — Other Ambulatory Visit: Payer: Self-pay | Admitting: Hematology & Oncology

## 2010-05-08 DIAGNOSIS — I10 Essential (primary) hypertension: Secondary | ICD-10-CM

## 2010-05-08 DIAGNOSIS — D473 Essential (hemorrhagic) thrombocythemia: Secondary | ICD-10-CM

## 2010-05-08 LAB — COMPREHENSIVE METABOLIC PANEL
Albumin: 4.1 g/dL (ref 3.5–5.2)
Alkaline Phosphatase: 95 U/L (ref 39–117)
BUN: 11 mg/dL (ref 6–23)
Calcium: 8.8 mg/dL (ref 8.4–10.5)
Glucose, Bld: 148 mg/dL — ABNORMAL HIGH (ref 70–99)
Potassium: 4 mEq/L (ref 3.5–5.3)

## 2010-05-08 LAB — CBC WITH DIFFERENTIAL (CANCER CENTER ONLY)
Eosinophils Absolute: 0 10*3/uL (ref 0.0–0.5)
LYMPH#: 1.1 10*3/uL (ref 0.9–3.3)
MCV: 104 fL — ABNORMAL HIGH (ref 81–101)
MONO#: 0.4 10*3/uL (ref 0.1–0.9)
NEUT#: 2.6 10*3/uL (ref 1.5–6.5)
Platelets: 672 10*3/uL — ABNORMAL HIGH (ref 145–400)
RBC: 3.65 10*6/uL — ABNORMAL LOW (ref 3.70–5.32)
WBC: 4.1 10*3/uL (ref 3.9–10.0)

## 2010-07-18 NOTE — Op Note (Signed)
NAMEMIKALYN, HERMIDA              ACCOUNT NO.:  [id number]   MEDICAL RECORD NO.:  [id number]          PATIENT TYPE:  OUT   LOCATION:  [facility]                         FACILITY:  [facility]   PHYSICIAN:  Rose Phi. Myna Hidalgo, M.D. DATE OF BIRTH:  04-15-1950   DATE OF PROCEDURE:  02/27/2004  DATE OF DISCHARGE:                                 OPERATIVE REPORT   PROCEDURE:  Left posterior iliac crest bone marrow biopsy and aspirate.   Ms. Hiltz was brought to the short stay unit.  She had an IV placed into her  right hand.   She was placed onto her right side.  She received a total of 5 mg of Versed  and 25 mg of Demerol for sedation.   The left posterior iliac crest region was prepped and draped in sterile  fashion.  Lidocaine 2% 10 mL was infiltrated under the skin down to the  periosteum.  A #11 scalpel was used to make an incision into the skin.  Two  bone marrow aspirates were obtained without difficulties.   A second incision was made into the skin.  A bone marrow biopsy core was  obtained without difficulties.   The patient tolerated the procedure well.  There were no complications.      PRE/MEDQ  D:  02/27/2004  T:  02/27/2004  Job:  [id number]

## 2010-07-21 ENCOUNTER — Telehealth: Payer: Self-pay | Admitting: *Deleted

## 2010-07-21 NOTE — Telephone Encounter (Signed)
patient  Is calling for samples of Bystolic 20 mg.  She would also like to know if there is a discount card for Bystolic.  Okay to  Leave a message.

## 2010-07-21 NOTE — Telephone Encounter (Signed)
Per Dr. Fabian Sharp- Ok to give samples. Left message on machine that samples are up front.

## 2010-08-05 ENCOUNTER — Ambulatory Visit (INDEPENDENT_AMBULATORY_CARE_PROVIDER_SITE_OTHER): Payer: BC Managed Care – PPO | Admitting: Internal Medicine

## 2010-08-05 ENCOUNTER — Encounter: Payer: Self-pay | Admitting: Internal Medicine

## 2010-08-05 VITALS — BP 130/88 | HR 66 | Wt 186.0 lb

## 2010-08-05 DIAGNOSIS — E876 Hypokalemia: Secondary | ICD-10-CM

## 2010-08-05 DIAGNOSIS — R51 Headache: Secondary | ICD-10-CM

## 2010-08-05 DIAGNOSIS — I10 Essential (primary) hypertension: Secondary | ICD-10-CM

## 2010-08-05 MED ORDER — LISINOPRIL 20 MG PO TABS: 20.0000 mg | ORAL_TABLET | Freq: Every day | ORAL | Status: DC

## 2010-08-05 NOTE — Assessment & Plan Note (Signed)
Has gone by this visit.

## 2010-08-05 NOTE — Progress Notes (Signed)
  Subjective:    Patient ID: Alexandra [name], female    DOB: [birth year], 60 y.o.   MRN: [id number]  HPI Patient comes in for followup of hypertension. Since her last visit she has done well but has only checked her readings once in a while. She continues on medication but has been out of the lisinopril for 2 days. Her a.m. blood pressure reading at a time was about 120 to G. has been rushing around it feels fine  Reading up today..had been doing better  128-144    Headaches didn't count her them because they're better.  Thrombocythemia  :  under care following Review of Systems Negative chest pain shortness of breath cough swelling.  Past history family history social history reviewed in the electronic medical record.     Objective:   Physical Exam Well-developed well-nourished in no acute distress Repeat blood pressure right arm ,regular cuff.  150/90  Large cuff fits better  130/86 CV RR  No clubbing cyanosis or edema [facility]  And normal K      Assessment & Plan:  Hypertension mildly resistant whitecoat effect  However had been very good last visit and chemistries were normal. His been out of medicine and couple days but her home machine is so recorded good readings although she hasn't taken the readings as frequently. At this point I suggest we not change her medication and have her confirm good readings at home and see her at a check up with labs in December. Call in the meantime if needed.

## 2010-08-05 NOTE — Assessment & Plan Note (Signed)
Normal at last labs     Chemistry      Component Value Date/Time   NA 139 05/08/2010 1050   NA 139 05/08/2010 1050   K 4.0 05/08/2010 1050   K 4.0 05/08/2010 1050   CL 103 05/08/2010 1050   CL 103 05/08/2010 1050   CO2 27 05/08/2010 1050   CO2 27 05/08/2010 1050   BUN 11 05/08/2010 1050   BUN 11 05/08/2010 1050   CREATININE 0.77 05/08/2010 1050   CREATININE 0.77 05/08/2010 1050      Component Value Date/Time   CALCIUM 8.8 05/08/2010 1050   CALCIUM 8.8 05/08/2010 1050   ALKPHOS 95 05/08/2010 1050   ALKPHOS 95 05/08/2010 1050   AST 17 05/08/2010 1050   AST 17 05/08/2010 1050   ALT 13 05/08/2010 1050   ALT 13 05/08/2010 1050   BILITOT 0.6 05/08/2010 1050   BILITOT 0.6 05/08/2010 1050

## 2010-08-05 NOTE — Patient Instructions (Addendum)
Check you readings at home  And indeed if on range no change  o  intervention besides current medication and healthy diet.  If elevated consistently call for advice. ROV in   cpx in December  Or as needed

## 2010-08-05 NOTE — Assessment & Plan Note (Signed)
Better with large cuff and after sitting  Had been 120 this am  At home and we had checked her machine in the past.  Suggest no change and continue.    Call if increase  suspect still a white coat effect. Out of med for 2 days.

## 2010-08-06 ENCOUNTER — Telehealth: Payer: Self-pay | Admitting: *Deleted

## 2010-08-06 NOTE — Telephone Encounter (Signed)
error 

## 2010-08-14 ENCOUNTER — Telehealth: Payer: Self-pay | Admitting: *Deleted

## 2010-08-14 MED ORDER — NEBIVOLOL HCL 20 MG PO TABS: 20.0000 mg | ORAL_TABLET | Freq: Every day | ORAL | Status: DC

## 2010-08-14 NOTE — Telephone Encounter (Signed)
Refill on bystolic, Rx sent  To pharmacy

## 2010-08-27 ENCOUNTER — Other Ambulatory Visit: Payer: Self-pay | Admitting: Hematology & Oncology

## 2010-08-27 ENCOUNTER — Encounter (HOSPITAL_BASED_OUTPATIENT_CLINIC_OR_DEPARTMENT_OTHER): Payer: BC Managed Care – PPO | Admitting: Hematology & Oncology

## 2010-08-27 DIAGNOSIS — I10 Essential (primary) hypertension: Secondary | ICD-10-CM

## 2010-08-27 DIAGNOSIS — D473 Essential (hemorrhagic) thrombocythemia: Secondary | ICD-10-CM

## 2010-08-27 LAB — CBC WITH DIFFERENTIAL (CANCER CENTER ONLY)
BASO#: 0 10*3/uL (ref 0.0–0.2)
Eosinophils Absolute: 0 10*3/uL (ref 0.0–0.5)
HGB: 12.4 g/dL (ref 11.6–15.9)
LYMPH%: 26.1 % (ref 14.0–48.0)
MCH: 34.9 pg — ABNORMAL HIGH (ref 26.0–34.0)
MCV: 101 fL (ref 81–101)
MONO#: 0.5 10*3/uL (ref 0.1–0.9)
MONO%: 11.2 % (ref 0.0–13.0)
NEUT#: 2.8 10*3/uL (ref 1.5–6.5)
RBC: 3.55 10*6/uL — ABNORMAL LOW (ref 3.70–5.32)

## 2010-08-27 LAB — FERRITIN: Ferritin: 166 ng/mL (ref 10–291)

## 2010-09-08 ENCOUNTER — Other Ambulatory Visit: Payer: Self-pay | Admitting: Internal Medicine

## 2010-09-16 ENCOUNTER — Other Ambulatory Visit: Payer: Self-pay | Admitting: Internal Medicine

## 2010-10-15 ENCOUNTER — Telehealth: Payer: Self-pay | Admitting: *Deleted

## 2010-10-15 NOTE — Telephone Encounter (Signed)
Ok if we have some. 

## 2010-10-15 NOTE — Telephone Encounter (Signed)
Pt would like samples of bystolic

## 2010-10-16 NOTE — Telephone Encounter (Signed)
Samples up front. Left message on machine about this.

## 2010-11-05 ENCOUNTER — Other Ambulatory Visit: Payer: Self-pay | Admitting: Internal Medicine

## 2010-11-05 DIAGNOSIS — Z1231 Encounter for screening mammogram for malignant neoplasm of breast: Secondary | ICD-10-CM

## 2010-11-06 ENCOUNTER — Other Ambulatory Visit: Payer: Self-pay | Admitting: Hematology & Oncology

## 2010-11-06 ENCOUNTER — Encounter (HOSPITAL_BASED_OUTPATIENT_CLINIC_OR_DEPARTMENT_OTHER): Payer: BC Managed Care – PPO | Admitting: Hematology & Oncology

## 2010-11-06 DIAGNOSIS — D473 Essential (hemorrhagic) thrombocythemia: Secondary | ICD-10-CM

## 2010-11-06 LAB — CBC WITH DIFFERENTIAL (CANCER CENTER ONLY)
BASO#: 0 10*3/uL (ref 0.0–0.2)
EOS%: 0.9 % (ref 0.0–7.0)
HGB: 12.6 g/dL (ref 11.6–15.9)
LYMPH%: 30.5 % (ref 14.0–48.0)
MCH: 35.7 pg — ABNORMAL HIGH (ref 26.0–34.0)
MCHC: 34.4 g/dL (ref 32.0–36.0)
MCV: 104 fL — ABNORMAL HIGH (ref 81–101)
MONO%: 13.2 % — ABNORMAL HIGH (ref 0.0–13.0)
NEUT#: 2.4 10*3/uL (ref 1.5–6.5)
NEUT%: 54.7 % (ref 39.6–80.0)

## 2010-11-06 LAB — IRON AND TIBC
%SAT: 44 % (ref 20–55)
TIBC: 247 ug/dL — ABNORMAL LOW (ref 250–470)
UIBC: 139 ug/dL (ref 125–400)

## 2010-11-17 ENCOUNTER — Other Ambulatory Visit: Payer: Self-pay | Admitting: Internal Medicine

## 2010-11-18 ENCOUNTER — Ambulatory Visit (HOSPITAL_COMMUNITY): Payer: BC Managed Care – PPO

## 2010-12-02 ENCOUNTER — Ambulatory Visit (HOSPITAL_COMMUNITY): Payer: BC Managed Care – PPO

## 2010-12-08 ENCOUNTER — Other Ambulatory Visit: Payer: Self-pay | Admitting: Hematology & Oncology

## 2010-12-08 ENCOUNTER — Encounter (HOSPITAL_BASED_OUTPATIENT_CLINIC_OR_DEPARTMENT_OTHER): Payer: BC Managed Care – PPO | Admitting: Hematology & Oncology

## 2010-12-08 DIAGNOSIS — D473 Essential (hemorrhagic) thrombocythemia: Secondary | ICD-10-CM

## 2010-12-08 LAB — CBC WITH DIFFERENTIAL (CANCER CENTER ONLY)
BASO#: 0 10*3/uL (ref 0.0–0.2)
Eosinophils Absolute: 0 10*3/uL (ref 0.0–0.5)
HGB: 12.3 g/dL (ref 11.6–15.9)
LYMPH#: 1.5 10*3/uL (ref 0.9–3.3)
MONO#: 0.5 10*3/uL (ref 0.1–0.9)
MONO%: 10.9 % (ref 0.0–13.0)
NEUT#: 2.7 10*3/uL (ref 1.5–6.5)
Platelets: 617 10*3/uL — ABNORMAL HIGH (ref 145–400)
RBC: 3.45 10*6/uL — ABNORMAL LOW (ref 3.70–5.32)
WBC: 4.8 10*3/uL (ref 3.9–10.0)

## 2010-12-08 LAB — CHCC SATELLITE - SMEAR

## 2010-12-16 ENCOUNTER — Ambulatory Visit (HOSPITAL_COMMUNITY)
Admission: RE | Admit: 2010-12-16 | Discharge: 2010-12-16 | Disposition: A | Discharge status: Home / Self Care | Payer: BC Managed Care – PPO | Source: Ambulatory Visit | Attending: Internal Medicine | Admitting: Internal Medicine

## 2010-12-16 DIAGNOSIS — Z1231 Encounter for screening mammogram for malignant neoplasm of breast: Secondary | ICD-10-CM | POA: Insufficient documentation

## 2010-12-17 ENCOUNTER — Other Ambulatory Visit: Payer: Self-pay | Admitting: Internal Medicine

## 2011-01-19 ENCOUNTER — Telehealth: Payer: Self-pay | Admitting: Internal Medicine

## 2011-01-19 NOTE — Telephone Encounter (Signed)
Requesting samples of Bystolic 20mg . Cannot afford rx. Thanks.

## 2011-01-20 NOTE — Telephone Encounter (Signed)
Ok if we have any  ? If any programs   Or coupons on line available

## 2011-01-20 NOTE — Telephone Encounter (Signed)
Samples up front 

## 2011-02-18 ENCOUNTER — Other Ambulatory Visit (INDEPENDENT_AMBULATORY_CARE_PROVIDER_SITE_OTHER): Payer: BC Managed Care – PPO

## 2011-02-18 DIAGNOSIS — Z0000 Encounter for general adult medical examination without abnormal findings: Secondary | ICD-10-CM

## 2011-02-18 LAB — CBC WITH DIFFERENTIAL/PLATELET
Basophils Relative: 0.5 % (ref 0.0–3.0)
Eosinophils Relative: 0.4 % (ref 0.0–5.0)
HCT: 39 % (ref 36.0–46.0)
Lymphs Abs: 1.3 10*3/uL (ref 0.7–4.0)
MCV: 110.2 fl — ABNORMAL HIGH (ref 78.0–100.0)
Monocytes Absolute: 0.5 10*3/uL (ref 0.1–1.0)
Monocytes Relative: 11.5 % (ref 3.0–12.0)
Neutrophils Relative %: 57.2 % (ref 43.0–77.0)
Platelets: 819 10*3/uL — ABNORMAL HIGH (ref 150.0–400.0)
RBC: 3.54 Mil/uL — ABNORMAL LOW (ref 3.87–5.11)
WBC: 4.4 10*3/uL — ABNORMAL LOW (ref 4.5–10.5)

## 2011-02-18 LAB — HEPATIC FUNCTION PANEL
AST: 20 U/L (ref 0–37)
Albumin: 3.9 g/dL (ref 3.5–5.2)
Alkaline Phosphatase: 92 U/L (ref 39–117)
Total Protein: 7.6 g/dL (ref 6.0–8.3)

## 2011-02-18 LAB — BASIC METABOLIC PANEL
BUN: 11 mg/dL (ref 6–23)
CO2: 29 mEq/L (ref 19–32)
Glucose, Bld: 125 mg/dL — ABNORMAL HIGH (ref 70–99)
Potassium: 3.1 mEq/L — ABNORMAL LOW (ref 3.5–5.1)
Sodium: 142 mEq/L (ref 135–145)

## 2011-02-18 LAB — TSH: TSH: 1.03 u[IU]/mL (ref 0.35–5.50)

## 2011-02-18 LAB — POCT URINALYSIS DIPSTICK
Glucose, UA: NEGATIVE
Ketones, UA: NEGATIVE
Spec Grav, UA: 1.015

## 2011-02-18 LAB — LIPID PANEL: Total CHOL/HDL Ratio: 4

## 2011-02-19 ENCOUNTER — Telehealth: Payer: Self-pay | Admitting: *Deleted

## 2011-02-19 NOTE — Telephone Encounter (Signed)
K is slightly low.  Increase K rich foods and recommend repeat BMP 2 weeks.

## 2011-02-19 NOTE — Telephone Encounter (Signed)
Tor Netters, Dr Rosezella [place] CMA called and requested Dr Caryl Never take a look at pt labs, concerned about platelets and potassium

## 2011-02-20 ENCOUNTER — Other Ambulatory Visit: Payer: BC Managed Care – PPO

## 2011-02-20 ENCOUNTER — Telehealth: Payer: Self-pay | Admitting: *Deleted

## 2011-02-20 NOTE — Telephone Encounter (Signed)
Left message to call back  

## 2011-02-20 NOTE — Telephone Encounter (Signed)
Pt aware.

## 2011-02-20 NOTE — Telephone Encounter (Signed)
Left a message for pt to return call 

## 2011-02-20 NOTE — Telephone Encounter (Signed)
Error

## 2011-02-27 ENCOUNTER — Ambulatory Visit (INDEPENDENT_AMBULATORY_CARE_PROVIDER_SITE_OTHER): Payer: BC Managed Care – PPO | Admitting: Internal Medicine

## 2011-02-27 ENCOUNTER — Encounter: Payer: Self-pay | Admitting: Internal Medicine

## 2011-02-27 VITALS — BP 150/70 | HR 72 | Ht 69.75 in | Wt 183.0 lb

## 2011-02-27 DIAGNOSIS — R7309 Other abnormal glucose: Secondary | ICD-10-CM

## 2011-02-27 DIAGNOSIS — D473 Essential (hemorrhagic) thrombocythemia: Secondary | ICD-10-CM

## 2011-02-27 DIAGNOSIS — Z1211 Encounter for screening for malignant neoplasm of colon: Secondary | ICD-10-CM

## 2011-02-27 DIAGNOSIS — Z0000 Encounter for general adult medical examination without abnormal findings: Secondary | ICD-10-CM

## 2011-02-27 DIAGNOSIS — E876 Hypokalemia: Secondary | ICD-10-CM

## 2011-02-27 DIAGNOSIS — I10 Essential (primary) hypertension: Secondary | ICD-10-CM

## 2011-02-27 MED ORDER — NEBIVOLOL HCL 20 MG PO TABS: 1.0000 | ORAL_TABLET | Freq: Every day | ORAL | Status: DC

## 2011-02-27 NOTE — Patient Instructions (Addendum)
Discontinue sweet tea and limit simple carbohydrates; this can help your blood sugar control.  I am not sure why your potassium is slightly low as it has been pretty good. Increased high potassium foods such as fruits and vegetables. Avoid excess sodium or salt. We will recheck this chemistry panel and blood sugar check in about a month. We'll then decide on followup at that time.  We'll have Dr. Pearlean Brownie the elevated platelet count. Will refer for colonoscopy someone will contact you about this.  Continue to monitor your blood pressure at home to ensure it is controlled. return office visit in 6 months or as appropriate depending on labs

## 2011-02-27 NOTE — Progress Notes (Signed)
Subjective:    Patient ID: Alexandra [name], female    DOB: [birth year], 60 y.o.   MRN: [id number]  HPI Patient comes in today for preventive visit and follow-up of medical issues. Update of her history since her last visit. No surgery change in health injury . No change in exercise tolerance bleeding or clotting BP readings at home are normal high normal sometime borderline. No se of meds per patient needs refill  UTD on mammo  Last pap 2011 nl no abnromal rxs no gyne sx. Plts  On Hydr urea     Review of Systems ROS:  GEN/ HEENTNo fever, significant weight changes sweats headaches vision problems hearing changes, CV/ PULM; No chest pain shortness of breath cough, syncope,edema  change in exercise tolerance. GI /GU: No adominal pain, vomiting, change in bowel habits. No blood in the stool. No significant GU symptoms. SKIN/HEME: ,no acute skin rashes suspicious lesions or bleeding. No lymphadenopathy, nodules, masses.  NEURO/ PSYCH:  No neurologic signs such as weakness numbness No depression anxiety. IMM/ Allergy: No unusual infections.   REST of 12 system review negative  Past history family history social history reviewed in the electronic medical record.      Objective:   Physical Exam Physical Exam: Vital signs reviewed WUJ:WJXB is a well-developed well-nourished alert cooperative  aa female who appears her stated age in no acute distress.  HEENT: normocephalic atraumatic , Eyes: PERRL EOM's full, conjunctiva clear, Nares: paten,t no deformity discharge or tenderness., Ears: no deformity EAC's clear TMs with normal landmarks. Mouth: clear OP, no lesions, edema.  Moist mucous membranes. Dentition in adequate repair. NECK: supple without masses, thyromegaly or bruits. CHEST/PULM:  Clear to auscultation and percussion breath sounds equal no wheeze , rales or rhonchi. No chest wall deformities or tenderness. CV: PMI is nondisplaced, S1 S2 no gallops, murmurs, rubs. Peripheral  pulses are full without delay.No JVD . Breast: normal by inspection . No dimpling, discharge, masses, tenderness or discharge .lumpy no nodules  ABDOMEN: Bowel sounds normal nontender  No guard or rebound, no hepato splenomegal no CVA tenderness.  No hernia. Extremtities:  No clubbing cyanosis or edema, no acute joint swelling or redness no focal atrophy NEURO:  Oriented x3, cranial nerves 3-12 appear to be intact, no obvious focal weakness,gait within normal limits no abnormal reflexes or asymmetrical SKIN: No acute rashes normal turgor, color, no bruising or petechiae. PSYCH: Oriented, good eye contact, no obvious depression anxiety, cognition and judgment appear normal. LN: no cervical axillary inguinal adenopathy Pelvic: NL ext GU, labia clear without lesions or rash . Vagina no lesions .Cervix: clear  UTERUS: Neg CMT Adnexa:  clear no masses . PAP not done not indicted  Rectal no masses     Lab Results  Component Value Date   WBC 4.4* 02/18/2011   HGB 12.7 02/18/2011   HCT 39.0 02/18/2011   PLT 819.0* 02/18/2011   GLUCOSE 125* 02/18/2011   CHOL 199 02/18/2011   TRIG 67.0 02/18/2011   HDL 55.40 02/18/2011   LDLDIRECT 134.8 02/18/2010   LDLCALC 130* 02/18/2011   ALT 18 02/18/2011   AST 20 02/18/2011   NA 142 02/18/2011   K 3.1* 02/18/2011   CL 106 02/18/2011   CREATININE 0.6 02/18/2011   BUN 11 02/18/2011   CO2 29 02/18/2011   TSH 1.03 02/18/2011        Assessment & Plan:  Preventive Health Care Counseled regarding healthy nutrition, exercise, sleep, injury prevention, calcium vit d and healthy weight .  Due for colonoscopy as never had one.  HT  Always  Problematic   Has pot 3.1 today  Will investigate and fu  Thrombocythemia  Worse   Prediabetic  bg range   Counseled. And fu   Dc sugar drinks etc.

## 2011-03-01 DIAGNOSIS — Z1211 Encounter for screening for malignant neoplasm of colon: Secondary | ICD-10-CM | POA: Insufficient documentation

## 2011-03-01 NOTE — Assessment & Plan Note (Signed)
Controlled at home by report . WC effect however today potassium is 3.1 for no  obv reason.  Will recheck with PRA /aldo labs Unsure if this is  Accurate as has been stable for the last 1-2 years.

## 2011-03-01 NOTE — Assessment & Plan Note (Signed)
Worse today on meds  Fu with hematology soon. No hx of clotting or sx.

## 2011-03-01 NOTE — Assessment & Plan Note (Signed)
prediabetic range today  Will cut out sweet tea etc.  No diabetic sx.  Will follow

## 2011-03-09 ENCOUNTER — Other Ambulatory Visit: Payer: Self-pay | Admitting: Hematology & Oncology

## 2011-03-09 ENCOUNTER — Other Ambulatory Visit (HOSPITAL_BASED_OUTPATIENT_CLINIC_OR_DEPARTMENT_OTHER): Payer: BC Managed Care – PPO | Admitting: Lab

## 2011-03-09 ENCOUNTER — Ambulatory Visit (HOSPITAL_BASED_OUTPATIENT_CLINIC_OR_DEPARTMENT_OTHER): Payer: BC Managed Care – PPO | Admitting: Hematology & Oncology

## 2011-03-09 VITALS — BP 133/85 | HR 90 | Temp 98.4°F | Ht 69.75 in | Wt 180.0 lb

## 2011-03-09 DIAGNOSIS — D473 Essential (hemorrhagic) thrombocythemia: Secondary | ICD-10-CM

## 2011-03-09 LAB — CBC WITH DIFFERENTIAL (CANCER CENTER ONLY)
BASO#: 0 10*3/uL (ref 0.0–0.2)
Eosinophils Absolute: 0 10*3/uL (ref 0.0–0.5)
HCT: 38.4 % (ref 34.8–46.6)
LYMPH%: 25.7 % (ref 14.0–48.0)
MCV: 105 fL — ABNORMAL HIGH (ref 81–101)
MONO#: 0.5 10*3/uL (ref 0.1–0.9)
NEUT%: 62.3 % (ref 39.6–80.0)
RBC: 3.65 10*6/uL — ABNORMAL LOW (ref 3.70–5.32)
WBC: 4.7 10*3/uL (ref 3.9–10.0)

## 2011-03-09 LAB — IRON AND TIBC
%SAT: 31 % (ref 20–55)
TIBC: 258 ug/dL (ref 250–470)

## 2011-03-09 MED ORDER — ANAGRELIDE HCL 1 MG PO CAPS: 1.0000 mg | ORAL_CAPSULE | Freq: Every day | ORAL | Status: DC

## 2011-03-09 NOTE — Progress Notes (Signed)
This office note has been dictated.

## 2011-03-09 NOTE — Progress Notes (Signed)
CC:   Alexandra Mends. Panosh, MD  DIAGNOSIS:  Essential thrombocythemia, JAK2 negative.  CURRENT THERAPY: 1. The patient is to start anagrelide 1 mg p.o. daily. 2. Aspirin 81 mg p.o. daily.  INTERIM HISTORY:  Alexandra [name] comes in for followup.  She apparently was seen recently by Dr. Fabian Sharp.  Lab work was done.  Platelet count was over 800,000.  When we Alexandra [name] back in October, her platelet count was 617.  She has been very diligent with taking her Hydrea.  She is taking the Hydrea 500 mg 3 times a day.  I think we are going to have to make a change to anagrelide.  I do not want to keep increasing her dose of Hydrea.  That could affect her white cells and red cells.  She has had no problems with nausea or vomiting.  She has had no headache.  There has been no pain in her hands or feet.  She has had no rashes.  She enjoyed the holidays.  She is with her family.  She is working without any difficulty.  She has had no problems with respect to iron deficiency.  We check her iron studies whenever we see her.  PHYSICAL EXAM:  General:  This is a well-developed, well-nourished African American female in no obvious distress.  Vital Signs: Temperature 98.4, pulse 90, respiratory rate 12, blood pressure 133/85. Weight is 180.  Head/Neck:  Exam shows a normocephalic, atraumatic skull.  There are no ocular or oral lesions.  There are no palpable cervical or supraclavicular lymph nodes.  Lungs:  Clear to percussion and auscultation bilaterally.  Cardiac:  Regular rate and rhythm with a normal S1, S2.  There are no murmurs, rubs or bruits.  Abdomen:  Soft with good bowel sounds.  There is no palpable abdominal mass.  There is no fluid wave.  There is no palpable hepatosplenomegaly.  Back:  No tenderness over the spine, ribs, or hips.  Extremities:  No clubbing, cyanosis or edema.  Neurologic:  Exam shows no focal neurological deficits.  Skin:  No rashes, ecchymosis or petechiae.  LABORATORY  STUDIES:  White cell count is 4.7, hemoglobin 12.9, hematocrit 38.4, platelet count 714.  MCV is 105.  IMPRESSION:  Alexandra [name] is a 61 year old African American female with thrombocythemia.  She is JAK2 negative.  She really has not responded to Hydrea like I thought she would.  We have had her on Hydrea now for a couple years.  I think we will get a response with anagrelide.  We will start her off at 1 mg a day.  We will get her back in a month for followup.  We can titrate the dose of anagrelide accordingly. I did go over the side effects of anagrelide with Alexandra [name], so she can watch out for any unusual effects.  I told her that there may be some swelling, some fluid retention, some nausea, some headache, maybe some palpitations.  She will let us know if she has any problems with the anagrelide.    ______________________________ Josph Macho, M.D. PRE/MEDQ  D:  03/09/2011  T:  03/09/2011  Job:  [id number]

## 2011-03-12 ENCOUNTER — Telehealth: Payer: Self-pay | Admitting: Hematology & Oncology

## 2011-03-12 NOTE — Telephone Encounter (Signed)
Left pt message moved 04-09-11 appointment time

## 2011-03-20 ENCOUNTER — Other Ambulatory Visit: Payer: BC Managed Care – PPO

## 2011-03-23 ENCOUNTER — Other Ambulatory Visit (INDEPENDENT_AMBULATORY_CARE_PROVIDER_SITE_OTHER): Payer: BC Managed Care – PPO

## 2011-03-23 DIAGNOSIS — I10 Essential (primary) hypertension: Secondary | ICD-10-CM

## 2011-03-23 LAB — BASIC METABOLIC PANEL
BUN: 12 mg/dL (ref 6–23)
Chloride: 105 mEq/L (ref 96–112)
GFR: 119.22 mL/min (ref >60.00)
Glucose, Bld: 117 mg/dL — ABNORMAL HIGH (ref 70–99)
Potassium: 3.3 mEq/L — ABNORMAL LOW (ref 3.5–5.1)
Sodium: 141 mEq/L (ref 135–145)

## 2011-03-23 LAB — HEMOGLOBIN A1C: Hgb A1c MFr Bld: 6.2 % (ref 4.6–6.5)

## 2011-03-29 LAB — ALDOSTERONE + RENIN ACTIVITY W/ RATIO
ALDO / PRA Ratio: 88.2 Ratio — ABNORMAL HIGH (ref 0.9–28.9)
Aldosterone: 15 ng/dL
PRA LC/MS/MS: 0.17 ng/mL/h — ABNORMAL LOW (ref 0.25–5.82)

## 2011-03-30 ENCOUNTER — Encounter: Payer: Self-pay | Admitting: Internal Medicine

## 2011-03-30 ENCOUNTER — Ambulatory Visit (INDEPENDENT_AMBULATORY_CARE_PROVIDER_SITE_OTHER): Payer: BC Managed Care – PPO | Admitting: Internal Medicine

## 2011-03-30 VITALS — BP 120/70 | HR 72 | Wt 180.0 lb

## 2011-03-30 DIAGNOSIS — R7309 Other abnormal glucose: Secondary | ICD-10-CM

## 2011-03-30 DIAGNOSIS — E876 Hypokalemia: Secondary | ICD-10-CM

## 2011-03-30 DIAGNOSIS — I10 Essential (primary) hypertension: Secondary | ICD-10-CM

## 2011-03-30 DIAGNOSIS — D473 Essential (hemorrhagic) thrombocythemia: Secondary | ICD-10-CM

## 2011-03-30 NOTE — Patient Instructions (Signed)
Unsure why your potassium is  Bit low Intensify  Potassium in diet and avoid sodium in diet.   Nose bleed are possibly from dryness . No reason to stop the asa for now.     Use saline nose spray or such for moisturizing before you go to bed at  night to see if helps the bleeding irritation. Check labs in a month or so  Am preferred  Will repeat the aldosterone levels.

## 2011-03-30 NOTE — Progress Notes (Signed)
  Subjective:    Patient ID: Alexandra [name], female    DOB: 26-Jan-1951, 61 y.o.   MRN: [id number]  HPI Patient comes in today for follow up of  multiple medical problems.  HT; 130 /135  Range.  At home and feels pretty well. On 3 meds no diuretics  Inc K rick ood Potassium no supplements or cramps  Plts increased  Per Oncology.  Hyperglycemia  Has stopped sweet tea and mny sugars.    Review of Systems Neg cp sob edema some blood at nose otherwie no bleeding Past history family history social history reviewed in the electronic medical record.     Objective:   Physical Exam WDWN in nad looks well  bp and labs reviewed  Heent : nl but spot of blood at ant nares  Bilaterally   Skin: normal capillary refill ,turgor , color: No acute rashes ,petechiae or bruising Lab Results  Component Value Date   WBC 4.7 03/09/2011   HGB 12.9 03/09/2011   HCT 38.4 03/09/2011   PLT 714* 03/09/2011   GLUCOSE 117* 03/23/2011   CHOL 199 02/18/2011   TRIG 67.0 02/18/2011   HDL 55.40 02/18/2011   LDLDIRECT 134.8 02/18/2010   LDLCALC 130* 02/18/2011   ALT 18 02/18/2011   AST 20 02/18/2011   NA 141 03/23/2011   K 3.3* 03/23/2011   CL 105 03/23/2011   CREATININE 0.7 03/23/2011   BUN 12 03/23/2011   CO2 28 03/23/2011   TSH 1.03 02/18/2011   HGBA1C 6.2 03/23/2011        Assessment & Plan:   HT   Better  But on 3 meds  And now K is slightly low   Her pra/ aldo ratio is elevated with nl aldo level.sample of bystolic given actually bp has been the best  control most recently in this combo   Increase k foods  Prefers no more meds if possible.   Will repeat labs earlier in am .  Not cw hyperaldos although clinically suspicious with lab profile.    Nasal irritation  Poss from dryness sample of ayr given moisture increase  Hyperglycemia better but in prediabetic stage

## 2011-03-31 ENCOUNTER — Other Ambulatory Visit: Payer: Self-pay | Admitting: Internal Medicine

## 2011-03-31 DIAGNOSIS — D473 Essential (hemorrhagic) thrombocythemia: Secondary | ICD-10-CM

## 2011-03-31 DIAGNOSIS — I10 Essential (primary) hypertension: Secondary | ICD-10-CM

## 2011-03-31 DIAGNOSIS — E876 Hypokalemia: Secondary | ICD-10-CM

## 2011-04-09 ENCOUNTER — Ambulatory Visit: Payer: BC Managed Care – PPO | Admitting: Hematology & Oncology

## 2011-04-09 ENCOUNTER — Other Ambulatory Visit: Payer: BC Managed Care – PPO | Admitting: Lab

## 2011-04-09 ENCOUNTER — Ambulatory Visit (HOSPITAL_BASED_OUTPATIENT_CLINIC_OR_DEPARTMENT_OTHER): Payer: BC Managed Care – PPO | Admitting: Hematology & Oncology

## 2011-04-09 ENCOUNTER — Other Ambulatory Visit (HOSPITAL_BASED_OUTPATIENT_CLINIC_OR_DEPARTMENT_OTHER): Payer: BC Managed Care – PPO | Admitting: Lab

## 2011-04-09 ENCOUNTER — Telehealth: Payer: Self-pay | Admitting: Hematology & Oncology

## 2011-04-09 DIAGNOSIS — D473 Essential (hemorrhagic) thrombocythemia: Secondary | ICD-10-CM

## 2011-04-09 LAB — CBC WITH DIFFERENTIAL (CANCER CENTER ONLY)
BASO#: 0 10*3/uL (ref 0.0–0.2)
BASO%: 0.4 % (ref 0.0–2.0)
EOS%: 1.1 % (ref 0.0–7.0)
HGB: 12.9 g/dL (ref 11.6–15.9)
LYMPH#: 1.1 10*3/uL (ref 0.9–3.3)
MCH: 33.4 pg (ref 26.0–34.0)
MCHC: 32.9 g/dL (ref 32.0–36.0)
MONO%: 11.2 % (ref 0.0–13.0)
NEUT#: 5.4 10*3/uL (ref 1.5–6.5)
Platelets: 1346 10*3/uL — ABNORMAL HIGH (ref 145–400)

## 2011-04-09 LAB — CHCC SATELLITE - SMEAR

## 2011-04-09 NOTE — Telephone Encounter (Signed)
Pt is getting 2-25 lab at [facility]. Alvino Chapel will call and add CBC to this lab so pt wont have to be stuck 2 times

## 2011-04-09 NOTE — Progress Notes (Signed)
This office note has been dictated.

## 2011-04-10 NOTE — Progress Notes (Signed)
CC:   Alexandra [name]. Panosh, MD  DIAGNOSIS:  Essential thrombocythemia, JAK2 negative.  CURRENT THERAPY: 1. Anagrelide 1 mg p.o. daily. 2. Aspirin 81 mg p.o. daily.  INTERIM HISTORY:  Alexandra [name] comes in for follow-up.  We started her on anagrelide about a month ago.  Unfortunately, her platelet count is now 1.3 million.  We are definitely going to have to increase the dose of anagrelide.  I am going to put her on 2 mg a day.  She has had no problems with pain.  She had only one episode of nosebleed.  This, I do not think, is related to the thrombocytosis.  She has had no burning in her hands or feet.  She has not noticed any kind of rashes.  There has been no bruising.  There has been no change in bowel or bladder habits.  She has not noticed any leg swelling.  PHYSICAL EXAMINATION:  General Appearance:  This is a well-developed, well-nourished African American female in no obvious distress.  Vital Signs:  97.9, pulse 80, respiratory rate 18, blood pressure 145/84. Weight was 180.  Head and Neck Exam:  Shows a normocephalic, atraumatic skull.  There are no ocular or oral lesions.  There are no palpable cervical or supraclavicular lymph nodes.  Lungs:  Clear bilaterally. Cardiac Exam:  Regular rate and rhythm with a normal S1 and S2.  There are no murmurs, rubs or bruits.  Abdominal Exam:  Soft with good bowel sounds.  There is no palpable abdominal mass.  There is no fluid wave. There is no palpable hepatosplenomegaly.  Back Exam:  No tenderness over the spine, ribs or hips.  Extremities:  Show no clubbing, cyanosis or edema.  Skin Exam:  No rashes, ecchymosis, or petechia.  LABORATORY STUDIES:  White cell count is 7.5, hemoglobin 13, hematocrit 39, platelet count 346,000.  Peripheral smear shows a marked increase in platelets.  There is no platelet clumping.  She has numerous large platelets.  Platelets are well granulated.  IMPRESSION:  Alexandra [name] is a 61 year old African American  female with essential thrombocythemia.  She initially was on Hydrea.  We were not getting a good response with this.  We then put her on anagrelide.  We clearly have not had a response to anagrelide.  I am going to increase her anagrelide dose up to 2 mg a day.  We can certainly increase it further up to a total of 3 mg daily if necessary.  I want to have [name] get her blood checked weekly now.  I will see her back myself in about 4 weeks' time.    ______________________________ Josph Macho, M.D. PRE/MEDQ  D:  04/09/2011  T:  04/10/2011  Job:  [id number]

## 2011-04-11 ENCOUNTER — Other Ambulatory Visit: Payer: Self-pay | Admitting: Internal Medicine

## 2011-04-16 ENCOUNTER — Other Ambulatory Visit (HOSPITAL_BASED_OUTPATIENT_CLINIC_OR_DEPARTMENT_OTHER): Payer: BC Managed Care – PPO | Admitting: Lab

## 2011-04-16 DIAGNOSIS — D473 Essential (hemorrhagic) thrombocythemia: Secondary | ICD-10-CM

## 2011-04-16 LAB — CBC WITH DIFFERENTIAL/PLATELET
Basophils Absolute: 0 10*3/uL (ref 0.0–0.1)
Eosinophils Absolute: 0.1 10*3/uL (ref 0.0–0.5)
HCT: 38 % (ref 34.8–46.6)
HGB: 12.6 g/dL (ref 11.6–15.9)
LYMPH%: 24.3 % (ref 14.0–49.7)
MONO#: 0.8 10*3/uL (ref 0.1–0.9)
NEUT#: 4 10*3/uL (ref 1.5–6.5)
NEUT%: 61.5 % (ref 38.4–76.8)
Platelets: 1183 10*3/uL — ABNORMAL HIGH (ref 145–400)
WBC: 6.5 10*3/uL (ref 3.9–10.3)
lymph#: 1.6 10*3/uL (ref 0.9–3.3)

## 2011-04-23 ENCOUNTER — Other Ambulatory Visit (HOSPITAL_BASED_OUTPATIENT_CLINIC_OR_DEPARTMENT_OTHER): Payer: BC Managed Care – PPO | Admitting: Lab

## 2011-04-23 DIAGNOSIS — D473 Essential (hemorrhagic) thrombocythemia: Secondary | ICD-10-CM

## 2011-04-23 LAB — CBC WITH DIFFERENTIAL/PLATELET
Basophils Absolute: 0 10*3/uL (ref 0.0–0.1)
EOS%: 1.4 % (ref 0.0–7.0)
Eosinophils Absolute: 0.1 10*3/uL (ref 0.0–0.5)
HCT: 37.7 % (ref 34.8–46.6)
HGB: 12.5 g/dL (ref 11.6–15.9)
MCH: 31.9 pg (ref 25.1–34.0)
MCV: 96.2 fL (ref 79.5–101.0)
MONO%: 9.4 % (ref 0.0–14.0)
NEUT#: 4.4 10*3/uL (ref 1.5–6.5)
NEUT%: 69.2 % (ref 38.4–76.8)

## 2011-04-27 ENCOUNTER — Telehealth: Payer: Self-pay

## 2011-04-27 ENCOUNTER — Other Ambulatory Visit (INDEPENDENT_AMBULATORY_CARE_PROVIDER_SITE_OTHER): Payer: BC Managed Care – PPO

## 2011-04-27 DIAGNOSIS — E876 Hypokalemia: Secondary | ICD-10-CM

## 2011-04-27 DIAGNOSIS — I10 Essential (primary) hypertension: Secondary | ICD-10-CM

## 2011-04-27 DIAGNOSIS — D473 Essential (hemorrhagic) thrombocythemia: Secondary | ICD-10-CM

## 2011-04-27 LAB — CBC WITH DIFFERENTIAL/PLATELET
Basophils Absolute: 0 10*3/uL (ref 0.0–0.1)
Basophils Relative: 0.3 % (ref 0.0–3.0)
HCT: 40.4 % (ref 36.0–46.0)
Hemoglobin: 12.9 g/dL (ref 12.0–15.0)
Lymphocytes Relative: 19.3 % (ref 12.0–46.0)
Lymphs Abs: 1.3 10*3/uL (ref 0.7–4.0)
Monocytes Relative: 11.8 % (ref 3.0–12.0)
Neutro Abs: 4.5 10*3/uL (ref 1.4–7.7)
RBC: 4.05 Mil/uL (ref 3.87–5.11)
RDW: 15.3 % — ABNORMAL HIGH (ref 11.5–14.6)

## 2011-04-27 LAB — CORTISOL: Cortisol, Plasma: 9.4 ug/dL

## 2011-04-27 LAB — BASIC METABOLIC PANEL
CO2: 31 mEq/L (ref 19–32)
Calcium: 9 mg/dL (ref 8.4–10.5)
GFR: 96.57 mL/min (ref >60.00)
Glucose, Bld: 169 mg/dL — ABNORMAL HIGH (ref 70–99)
Potassium: 4 mEq/L (ref 3.5–5.1)
Sodium: 141 mEq/L (ref 135–145)

## 2011-04-27 NOTE — Telephone Encounter (Signed)
[facility] called and stated pt's platlets were 1,264,000.

## 2011-04-27 NOTE — Telephone Encounter (Signed)
Labs sent

## 2011-04-27 NOTE — Telephone Encounter (Signed)
She is being treated for elevated platelets   In this range  by Dr Myna Hidalgo . Please send him a copy of these results .

## 2011-05-03 LAB — ALDOSTERONE + RENIN ACTIVITY W/ RATIO: ALDO / PRA Ratio: 141.7 Ratio — ABNORMAL HIGH (ref 0.9–28.9)

## 2011-05-07 ENCOUNTER — Other Ambulatory Visit (HOSPITAL_BASED_OUTPATIENT_CLINIC_OR_DEPARTMENT_OTHER): Payer: BC Managed Care – PPO | Admitting: Lab

## 2011-05-07 DIAGNOSIS — D473 Essential (hemorrhagic) thrombocythemia: Secondary | ICD-10-CM

## 2011-05-07 LAB — CBC WITH DIFFERENTIAL/PLATELET
Basophils Absolute: 0 10*3/uL (ref 0.0–0.1)
Eosinophils Absolute: 0.1 10*3/uL (ref 0.0–0.5)
HGB: 12.1 g/dL (ref 11.6–15.9)
MCV: 93.4 fL (ref 79.5–101.0)
MONO#: 0.6 10*3/uL (ref 0.1–0.9)
MONO%: 8.6 % (ref 0.0–14.0)
NEUT#: 4.3 10*3/uL (ref 1.5–6.5)
RDW: 13.7 % (ref 11.2–14.5)

## 2011-05-08 ENCOUNTER — Ambulatory Visit (HOSPITAL_BASED_OUTPATIENT_CLINIC_OR_DEPARTMENT_OTHER): Payer: BC Managed Care – PPO | Admitting: Hematology & Oncology

## 2011-05-08 DIAGNOSIS — D473 Essential (hemorrhagic) thrombocythemia: Secondary | ICD-10-CM

## 2011-05-08 NOTE — Progress Notes (Signed)
CC:   Alexandra [name]. Panosh, MD  DIAGNOSIS:  Essential thrombocythemia, JAK2 negative.  CURRENT THERAPY: 1. Anagrelide 3 mg p.o. daily. 2. Aspirin 81 mg p.o. daily.  INTERIM HISTORY:  Ms. Lady comes in for her followup.  We have had to keep increasing her anagrelide dose.  She has done well with the anagrelide.  Her platelet count has been a little bit more "stubborn" and we are starting to, I think, see a response.  She has had no problems with anagrelide.  There has been no leg swelling.  There has been no rash.  There have been no palpitations.  Of note, today is her birthday.  She is planning a special day.  She has had no diarrhea.  There has been no headache.  There has been no pain in her hands or feet.  PHYSICAL EXAMINATION:  General:  This is a well-developed, well- nourished black female in no obvious distress.  Vital signs:  Show temperature 97.1, pulse 82, respiratory rate 18, blood pressure 156/94. Weight is 176.  Head and neck:  Exam shows a normocephalic, atraumatic skull.  There are no ocular or oral lesions.  No palpable cervical, supraclavicular lymph nodes.  Lungs:  Are clear bilaterally.  Cardiac: Regular rate and rhythm with a normal S1 and S2.  There are no murmurs, rubs or bruits.  Abdomen:  Soft with good bowel sounds.  There is no palpable abdominal mass.  There is no fluid wave.  There is no palpable hepatosplenomegaly.  Back:  No tenderness over the spine, ribs or hips. Extremities:  Shows no clubbing, cyanosis or edema.  Neurological: Shows no focal neurological deficits.  Skin:  Exam shows no rashes, ecchymoses or petechiae.  LABORATORY STUDIES:  White cell count is 6.6, hemoglobin 12.1, hematocrit 37, platelet count 1.1 million.  IMPRESSION:  Ms. Nase is a 61 year old African American female with essential thrombocythemia.  Again, her platelet count has gone up fairly quickly recently.  I do not see anything on exam that would suggest any type of  transformation of her disease.  She does not have splenomegaly. There is no hepatomegaly.  We will continue to follow her blood work weekly.  I will go ahead and plan to see her back in another 4 weeks myself.    ______________________________ Josph Macho, M.D. PRE/MEDQ  D:  05/08/2011  T:  05/08/2011  Job:  [id number]

## 2011-05-08 NOTE — Progress Notes (Signed)
This office note has been dictated.

## 2011-05-09 ENCOUNTER — Other Ambulatory Visit: Payer: Self-pay | Admitting: Internal Medicine

## 2011-05-15 ENCOUNTER — Other Ambulatory Visit: Payer: Self-pay | Admitting: *Deleted

## 2011-05-15 DIAGNOSIS — D473 Essential (hemorrhagic) thrombocythemia: Secondary | ICD-10-CM

## 2011-05-15 MED ORDER — ANAGRELIDE HCL 1 MG PO CAPS: ORAL_CAPSULE | ORAL | Status: DC

## 2011-05-18 ENCOUNTER — Encounter: Payer: Self-pay | Admitting: Gastroenterology

## 2011-05-18 ENCOUNTER — Other Ambulatory Visit (HOSPITAL_BASED_OUTPATIENT_CLINIC_OR_DEPARTMENT_OTHER): Payer: BC Managed Care – PPO | Admitting: Lab

## 2011-05-18 DIAGNOSIS — D473 Essential (hemorrhagic) thrombocythemia: Secondary | ICD-10-CM

## 2011-05-18 LAB — CBC WITH DIFFERENTIAL/PLATELET
Basophils Absolute: 0 10*3/uL (ref 0.0–0.1)
Eosinophils Absolute: 0.1 10*3/uL (ref 0.0–0.5)
HCT: 36.5 % (ref 34.8–46.6)
HGB: 12.1 g/dL (ref 11.6–15.9)
LYMPH%: 23.6 % (ref 14.0–49.7)
MCHC: 33.2 g/dL (ref 31.5–36.0)
MONO#: 0.6 10*3/uL (ref 0.1–0.9)
NEUT#: 4 10*3/uL (ref 1.5–6.5)
NEUT%: 65.3 % (ref 38.4–76.8)
Platelets: 1023 10*3/uL — ABNORMAL HIGH (ref 145–400)
WBC: 6.2 10*3/uL (ref 3.9–10.3)

## 2011-05-25 ENCOUNTER — Other Ambulatory Visit (HOSPITAL_BASED_OUTPATIENT_CLINIC_OR_DEPARTMENT_OTHER): Payer: BC Managed Care – PPO | Admitting: Lab

## 2011-05-25 DIAGNOSIS — D473 Essential (hemorrhagic) thrombocythemia: Secondary | ICD-10-CM

## 2011-05-25 LAB — CBC WITH DIFFERENTIAL/PLATELET
Basophils Absolute: 0 10*3/uL (ref 0.0–0.1)
EOS%: 1.7 % (ref 0.0–7.0)
Eosinophils Absolute: 0.1 10*3/uL (ref 0.0–0.5)
HCT: 37.6 % (ref 34.8–46.6)
HGB: 12.3 g/dL (ref 11.6–15.9)
MCH: 29.4 pg (ref 25.1–34.0)
MCV: 90 fL (ref 79.5–101.0)
MONO%: 11.9 % (ref 0.0–14.0)
NEUT#: 3.7 10*3/uL (ref 1.5–6.5)
NEUT%: 62.5 % (ref 38.4–76.8)
Platelets: 946 10*3/uL — ABNORMAL HIGH (ref 145–400)

## 2011-06-01 ENCOUNTER — Other Ambulatory Visit (HOSPITAL_BASED_OUTPATIENT_CLINIC_OR_DEPARTMENT_OTHER): Payer: BC Managed Care – PPO | Admitting: Lab

## 2011-06-01 DIAGNOSIS — D473 Essential (hemorrhagic) thrombocythemia: Secondary | ICD-10-CM

## 2011-06-01 LAB — CBC WITH DIFFERENTIAL/PLATELET
Eosinophils Absolute: 0.1 10*3/uL (ref 0.0–0.5)
HCT: 38.5 % (ref 34.8–46.6)
LYMPH%: 20.1 % (ref 14.0–49.7)
MONO#: 0.7 10*3/uL (ref 0.1–0.9)
NEUT#: 3.9 10*3/uL (ref 1.5–6.5)
NEUT%: 65.4 % (ref 38.4–76.8)
Platelets: 1016 10*3/uL — ABNORMAL HIGH (ref 145–400)
WBC: 6 10*3/uL (ref 3.9–10.3)

## 2011-06-03 ENCOUNTER — Telehealth: Payer: Self-pay

## 2011-06-03 NOTE — Telephone Encounter (Signed)
This would be a good idea .   Please arrange for BMP and Hg a1c to be done at [facility] labs dx hyperglycemia and  Hypertension

## 2011-06-03 NOTE — Telephone Encounter (Signed)
Pt states she is scheduled for lab work on 4/8 at Dr. Otilio Connors office.  Pt is requesting that Dr. Fabian Sharp add a glucose test to the lab work due to having a previous high glucose reading.  Pls advise.

## 2011-06-04 ENCOUNTER — Other Ambulatory Visit: Payer: Self-pay | Admitting: Internal Medicine

## 2011-06-04 DIAGNOSIS — R739 Hyperglycemia, unspecified: Secondary | ICD-10-CM

## 2011-06-04 DIAGNOSIS — I10 Essential (primary) hypertension: Secondary | ICD-10-CM

## 2011-06-04 NOTE — Telephone Encounter (Signed)
Called pt and left a detailed message that lab work will be added.

## 2011-06-08 ENCOUNTER — Ambulatory Visit (HOSPITAL_BASED_OUTPATIENT_CLINIC_OR_DEPARTMENT_OTHER): Payer: BC Managed Care – PPO | Admitting: Hematology & Oncology

## 2011-06-08 ENCOUNTER — Other Ambulatory Visit (HOSPITAL_BASED_OUTPATIENT_CLINIC_OR_DEPARTMENT_OTHER): Payer: BC Managed Care – PPO | Admitting: Lab

## 2011-06-08 VITALS — BP 149/86 | HR 102 | Temp 97.3°F | Ht 69.0 in | Wt 173.0 lb

## 2011-06-08 DIAGNOSIS — D473 Essential (hemorrhagic) thrombocythemia: Secondary | ICD-10-CM

## 2011-06-08 LAB — CBC WITH DIFFERENTIAL (CANCER CENTER ONLY)
BASO#: 0 10*3/uL (ref 0.0–0.2)
EOS%: 1.9 % (ref 0.0–7.0)
HCT: 39.7 % (ref 34.8–46.6)
HGB: 12.9 g/dL (ref 11.6–15.9)
MCH: 28.9 pg (ref 26.0–34.0)
MCHC: 32.5 g/dL (ref 32.0–36.0)
MONO%: 11.7 % (ref 0.0–13.0)
NEUT%: 67.2 % (ref 39.6–80.0)

## 2011-06-08 NOTE — Progress Notes (Signed)
This office note has been dictated.

## 2011-06-09 NOTE — Progress Notes (Signed)
CC:   Alexandra [name]. Panosh, MD  DIAGNOSIS:  Essential thrombocythemia.  CURRENT THERAPY: 1. Anagrelide 3 mg p.o. daily. 2. Aspirin 81 mg p.o. daily.  INTERIM HISTORY:  Ms. Lineman comes in for followup.  She is doing okay. She gets a little bit of a headache with the anagrelide.  She takes I think either nonsteroidals or Tylenol for this.  She has had no problems with bleeding.  There has been no fatigue.  She has had no leg swelling. She has had no change in bowel or bladder habits.  She is still working without any difficulties.  She has not noted any palpitations.  There has been no shortness of breath.  PHYSICAL EXAMINATION:  General:  This is a well-developed, well- nourished black female in no obvious distress.  Vital signs:  Show temperature of 97.3, pulse 102, respiratory rate 18, blood pressure 149/86.  Weight is 173.  Head and neck:  Exam shows a normocephalic, atraumatic skull.  There are no ocular or oral lesions.  There are no palpable cervical, supraclavicular lymph nodes.  Lungs:  Clear bilaterally.  Cardiac:  Regular rate and rhythm with a normal S1 and S2. There are no murmurs, rubs or bruits.  Abdomen:  Soft with good bowel sounds.  There is no fluid wave.  There is no palpable hepatosplenomegaly.  Back:  No tenderness over the spine, ribs or hips. Extremities:  Shows no clubbing, cyanosis or edema.  Neurological: Shows no focal neurological deficits.  Skin:  No rashes, ecchymosis or petechia.  LABORATORY STUDIES:  White cell count 7.7, hemoglobin 12.9, hematocrit 39.7, platelet count 1.1 million.  IMPRESSION:  Ms. Warshawsky is a 61 year old African American female with essential thrombocythemia.  Again, her platelet count just is becoming a little bit resilient.  I am surprised that her bone marrow is not responding as it should with anagrelide.  I am going to increase her anagrelide dose up to 4 mg a day now. Hopefully, this might be able to help [place] out.  I want to  keep taking her platelet count weekly.  If we find that her platelets are not responding to the 4 mg of anagrelide, I may re-add Hydrea.  I want to see Ms. Petzold back in a month.    ______________________________ Josph Macho, M.D. PRE/MEDQ  D:  06/09/2011  T:  06/09/2011  Job:  [id number]

## 2011-06-12 ENCOUNTER — Other Ambulatory Visit: Payer: Self-pay | Admitting: *Deleted

## 2011-06-12 DIAGNOSIS — D473 Essential (hemorrhagic) thrombocythemia: Secondary | ICD-10-CM

## 2011-06-12 MED ORDER — ANAGRELIDE HCL 1 MG PO CAPS: ORAL_CAPSULE | ORAL | Status: DC

## 2011-06-12 NOTE — Telephone Encounter (Signed)
Pt called asking to have her rx changed as she was recently increased to 4 mg (4 tabs) per day of Anagrelide and only has a few pills left. New rx sent via epresribe to her pharmacy.

## 2011-06-15 ENCOUNTER — Other Ambulatory Visit: Payer: BC Managed Care – PPO | Admitting: Lab

## 2011-06-16 ENCOUNTER — Telehealth: Payer: Self-pay | Admitting: Internal Medicine

## 2011-06-16 NOTE — Telephone Encounter (Signed)
Called to make pt aware that labs for hgA1c and BMP were not drawn at her appt.  Advise pt to come here when next lab work is due or pt can come have labs done at [facility].

## 2011-06-16 NOTE — Telephone Encounter (Signed)
Pt called req to get results from glucose test that was done on last Monday.

## 2011-06-18 ENCOUNTER — Other Ambulatory Visit (HOSPITAL_BASED_OUTPATIENT_CLINIC_OR_DEPARTMENT_OTHER): Payer: BC Managed Care – PPO | Admitting: Lab

## 2011-06-18 DIAGNOSIS — D473 Essential (hemorrhagic) thrombocythemia: Secondary | ICD-10-CM

## 2011-06-18 LAB — CBC WITH DIFFERENTIAL/PLATELET
Basophils Absolute: 0 10*3/uL (ref 0.0–0.1)
EOS%: 1.6 % (ref 0.0–7.0)
HGB: 11.8 g/dL (ref 11.6–15.9)
LYMPH%: 17.2 % (ref 14.0–49.7)
MCH: 28.2 pg (ref 25.1–34.0)
MCV: 86.8 fL (ref 79.5–101.0)
MONO%: 12.4 % (ref 0.0–14.0)
RDW: 14.8 % — ABNORMAL HIGH (ref 11.2–14.5)

## 2011-06-23 ENCOUNTER — Telehealth: Payer: Self-pay | Admitting: Internal Medicine

## 2011-06-23 NOTE — Telephone Encounter (Signed)
Gwendolyn from [facility] called stating that the patient is calling inquiring about her lab results. Elinor Dodge states they drew the labs as a courtesy to our office and they were test by [facility]. Gwendolyn states the nurse need to call [facility] at [phone] and retrieve the patient's results as the patient is anticipating a call back with test results. Please assist.

## 2011-06-24 ENCOUNTER — Telehealth: Payer: Self-pay | Admitting: *Deleted

## 2011-06-24 ENCOUNTER — Other Ambulatory Visit: Payer: Self-pay | Admitting: *Deleted

## 2011-06-24 ENCOUNTER — Encounter: Payer: Self-pay | Admitting: *Deleted

## 2011-06-24 DIAGNOSIS — K219 Gastro-esophageal reflux disease without esophagitis: Secondary | ICD-10-CM

## 2011-06-24 MED ORDER — ESOMEPRAZOLE MAGNESIUM 20 MG PO CPDR: 20.0000 mg | DELAYED_RELEASE_CAPSULE | Freq: Every day | ORAL | Status: DC

## 2011-06-24 NOTE — Telephone Encounter (Signed)
Called patient to let her know that her platelets are coming down nicely per dr. Myna Hidalgo. Patient complains of acid reflux due to the Anagrelide.  Will talk with Dr. Myna Hidalgo about sending Rx electronically to patient pharmacy.

## 2011-06-24 NOTE — Telephone Encounter (Signed)
Message copied by Anselm Jungling on Wed Jun 24, 2011  9:50 AM ------      Message from: Arlan Organ R      Created: Mon Jun 22, 2011  6:35 PM       Call- plt are coming down!!!!  Keep up the good work!!  pete

## 2011-06-24 NOTE — Telephone Encounter (Signed)
Left message on machine for patient to call the office where she was seen  By the provider for lab results

## 2011-06-25 ENCOUNTER — Other Ambulatory Visit (HOSPITAL_BASED_OUTPATIENT_CLINIC_OR_DEPARTMENT_OTHER): Payer: BC Managed Care – PPO | Admitting: Lab

## 2011-06-25 DIAGNOSIS — D473 Essential (hemorrhagic) thrombocythemia: Secondary | ICD-10-CM

## 2011-06-25 LAB — CBC WITH DIFFERENTIAL/PLATELET
Basophils Absolute: 0.1 10*3/uL (ref 0.0–0.1)
EOS%: 1.1 % (ref 0.0–7.0)
Eosinophils Absolute: 0.1 10*3/uL (ref 0.0–0.5)
HCT: 35.9 % (ref 34.8–46.6)
HGB: 11.5 g/dL — ABNORMAL LOW (ref 11.6–15.9)
MCH: 27.4 pg (ref 25.1–34.0)
MCV: 85.7 fL (ref 79.5–101.0)
MONO%: 10.6 % (ref 0.0–14.0)
NEUT%: 73.2 % (ref 38.4–76.8)
lymph#: 1 10*3/uL (ref 0.9–3.3)

## 2011-07-02 ENCOUNTER — Other Ambulatory Visit (HOSPITAL_BASED_OUTPATIENT_CLINIC_OR_DEPARTMENT_OTHER): Payer: BC Managed Care – PPO | Admitting: Lab

## 2011-07-02 DIAGNOSIS — D473 Essential (hemorrhagic) thrombocythemia: Secondary | ICD-10-CM

## 2011-07-02 LAB — CBC WITH DIFFERENTIAL/PLATELET
Eosinophils Absolute: 0.1 10*3/uL (ref 0.0–0.5)
HCT: 34.2 % — ABNORMAL LOW (ref 34.8–46.6)
LYMPH%: 15.2 % (ref 14.0–49.7)
MCV: 84.7 fL (ref 79.5–101.0)
MONO%: 12.4 % (ref 0.0–14.0)
NEUT#: 5.2 10*3/uL (ref 1.5–6.5)
NEUT%: 69.9 % (ref 38.4–76.8)
Platelets: 554 10*3/uL — ABNORMAL HIGH (ref 145–400)
RBC: 4.03 10*6/uL (ref 3.70–5.45)
nRBC: 0 % (ref 0–0)

## 2011-07-03 ENCOUNTER — Telehealth: Payer: Self-pay | Admitting: *Deleted

## 2011-07-03 ENCOUNTER — Telehealth: Payer: Self-pay

## 2011-07-03 NOTE — Telephone Encounter (Signed)
Patient called back after stating that she was called by our office inquiring as to what her Anagrelide dose was.  Patient slightly irriitated that she feels Dr. Myna Hidalgo is not getting her me5/2/13ssages about her Anagrelide dose.  She wants Dr. Myna Hidalgo to know that on 06/26/11 she decreased her Anagrelide dose from 4 mg to 3 mg daily and has been on this since then.  Message given to Dr. Lupita Leash to address on Monday when he is back in the office.  Patient aware that Dr. Myna Hidalgo gone today and wont get a message until Monday.

## 2011-07-03 NOTE — Telephone Encounter (Signed)
Per Dr. Fabian Sharp called pt to advise that estimated average glucose was 137.  Per Dr. Fabian Sharp this is in the pre-diabetic range.  Pt is aware of lab results.  Advised pt to come in for an office visit to discuss the labs or to have a nutrition referral done and repeat the lab work in 2 months.    Pt would like to know if her medications could affect the lab results.  Pt would also like to know if Dr. Fabian Sharp could send her some information on what to eat and redo the lab work in 2 months to see if the changes have helped.  Pls advise.

## 2011-07-03 NOTE — Telephone Encounter (Signed)
Pt called requesting lab work from 06/04/2011.  Advised pt back on 06/16/2011 that according to epic the lab work had not been resulted.  Pt stated at that time she would call Dr. Gustavo Lah office and get clarification.  On 07/03/11 pt called back about lab results and states she was told that the labs were drawn.    Called Dr. Gustavo Lah office and spoke with Dede Query and she states pt's labs were drawn on 06/08/11 and the BMP and A1C were sent to [facility].    Called [facility] at [phone] and spoke with Jamesetta So and pt's labs were sent to Dr. Fabian Sharp for review.    Called pt to make aware that lab results have been tracked down and given to Dr. Fabian Sharp for review.

## 2011-07-03 NOTE — Telephone Encounter (Signed)
Ok to do  Also check [employer] web side  Hg a1c and blood sugar in 3 months. Then OV.

## 2011-07-06 ENCOUNTER — Telehealth: Payer: Self-pay | Admitting: *Deleted

## 2011-07-06 NOTE — Telephone Encounter (Signed)
Reviewed labs and current Anagrelide dose with Dr Myna Hidalgo. To stay on 3 mg daily until checked again. Need to see if her count is going to go up or down. Spoke to pt. She verbalized understanding and appreciated the call.

## 2011-07-07 NOTE — Telephone Encounter (Signed)
Per Dr. Fabian Sharp information mailed to pt's home address.  Called pt and left a message for a return call to re-check blood work and schedule ov.

## 2011-07-13 ENCOUNTER — Ambulatory Visit (HOSPITAL_BASED_OUTPATIENT_CLINIC_OR_DEPARTMENT_OTHER): Payer: BC Managed Care – PPO | Admitting: Hematology & Oncology

## 2011-07-13 ENCOUNTER — Other Ambulatory Visit (HOSPITAL_BASED_OUTPATIENT_CLINIC_OR_DEPARTMENT_OTHER): Payer: BC Managed Care – PPO | Admitting: Lab

## 2011-07-13 VITALS — BP 149/94 | HR 83 | Temp 97.2°F | Ht 69.0 in | Wt 168.0 lb

## 2011-07-13 DIAGNOSIS — D473 Essential (hemorrhagic) thrombocythemia: Secondary | ICD-10-CM

## 2011-07-13 LAB — CBC WITH DIFFERENTIAL (CANCER CENTER ONLY)
BASO#: 0 10*3/uL (ref 0.0–0.2)
BASO%: 0.5 % (ref 0.0–2.0)
EOS%: 2.1 % (ref 0.0–7.0)
HCT: 38.9 % (ref 34.8–46.6)
HGB: 12.6 g/dL (ref 11.6–15.9)
LYMPH#: 1.3 10*3/uL (ref 0.9–3.3)
LYMPH%: 20.8 % (ref 14.0–48.0)
MCH: 27.2 pg (ref 26.0–34.0)
MCHC: 32.4 g/dL (ref 32.0–36.0)
MCV: 84 fL (ref 81–101)
MONO%: 8.8 % (ref 0.0–13.0)
NEUT%: 67.8 % (ref 39.6–80.0)
RDW: 14.4 % (ref 11.1–15.7)

## 2011-07-13 NOTE — Progress Notes (Signed)
This office note has been dictated.

## 2011-07-14 NOTE — Progress Notes (Signed)
CC:   Alexandra Mends. Panosh, MD  DIAGNOSIS:  Essential thrombocythemia, JAK2 negative.  CURRENT THERAPY: 1. Anagrelide 3 mg p.o. daily (patient to increase to 4 mg p.o.     daily). 2. Aspirin 81 mg p.o. daily.  INTERIM HISTORY:  Alexandra [name] comes in for follow up.  She has been doing okay on the 3 mg of anagrelide.  She is having some issues with the 4 mg with some headaches.  With the 4 mg, however, her platelet count went down to 344 at its lowest.  When I cut her dose back to 3 mg a day, her platelet count has been going up.  She is still working.  She is having no headache.  There is no burning in the hands or feet.  She has no rashes.  There is no change in bowel or bladder habits.  There is no rash.  There is no bruising.  PHYSICAL EXAMINATION:  This is a well-developed, well-nourished black female in no obvious distress.  Vital Signs:  Temperature 97.2, pulse 83, respiratory rate 18, blood pressure 149/94.  Weight is 168.  Head and neck:  Normocephalic, atraumatic skull. There is no scleral icterus. There are no oral lesions.  There is no adenopathy in the neck.  Her thyroid is not palpable.  Lungs:  Clear bilaterally.  Cardiac:  Regular rate and rhythm with a normal S1 and S2.  There are no murmurs, rubs or bruits.  Abdomen:  Soft with good bowel sounds.  There is no fluid wave. There is no guarding or rebound tenderness.  There is no palpable abdominal mass.  There is no palpable hepatosplenomegaly.  Back:  No kyphosis.  There is no tenderness over the spine, ribs, or hips. Extremities:  No clubbing, cyanosis or edema.  She has good range of motion of her joints.  She has good muscle tone in her legs and arms. She has good pulses in her distal extremities.  Skin:  Rare ecchymoses. There is no petechia.  Neurologic:  No focal neurological deficits.  LABORATORY STUDIES:  White cell count 6.1, hemoglobin 12.6, hematocrit 38.9, platelet count is 914.  MCV is 84.  IMPRESSION:  [name] is a 61 year old African American female with essential thrombocythemia.  This is JAK2 negative.  There is no evidence of iron deficiency.  We checked her iron back in January.  Her ferritin was 179.  Iron saturation was 31%.  I believe that we should probably check her iron studies again.  I will plan to check them once we get her back in the office.  We will increase her anagrelide dose to 4 mg.  Again, she seemed to tolerate this okay.  I want to see her back in another 6 weeks.    ______________________________ Josph Macho, M.D. PRE/MEDQ  D:  07/13/2011  T:  07/14/2011  Job:  [id number]

## 2011-07-22 ENCOUNTER — Other Ambulatory Visit (HOSPITAL_BASED_OUTPATIENT_CLINIC_OR_DEPARTMENT_OTHER): Payer: BC Managed Care – PPO | Admitting: Lab

## 2011-07-22 DIAGNOSIS — D473 Essential (hemorrhagic) thrombocythemia: Secondary | ICD-10-CM

## 2011-07-22 LAB — CBC WITH DIFFERENTIAL/PLATELET
BASO%: 1.2 % (ref 0.0–2.0)
Basophils Absolute: 0.1 10*3/uL (ref 0.0–0.1)
EOS%: 1.6 % (ref 0.0–7.0)
MCH: 26.5 pg (ref 25.1–34.0)
MCHC: 32.6 g/dL (ref 31.5–36.0)
MCV: 81.2 fL (ref 79.5–101.0)
MONO%: 10.6 % (ref 0.0–14.0)
RBC: 4.48 10*6/uL (ref 3.70–5.45)
RDW: 14.4 % (ref 11.2–14.5)
lymph#: 1.4 10*3/uL (ref 0.9–3.3)

## 2011-07-29 ENCOUNTER — Other Ambulatory Visit (HOSPITAL_BASED_OUTPATIENT_CLINIC_OR_DEPARTMENT_OTHER): Payer: BC Managed Care – PPO | Admitting: Lab

## 2011-07-29 DIAGNOSIS — D473 Essential (hemorrhagic) thrombocythemia: Secondary | ICD-10-CM

## 2011-07-29 LAB — CBC WITH DIFFERENTIAL/PLATELET
BASO%: 1.1 % (ref 0.0–2.0)
Eosinophils Absolute: 0.1 10*3/uL (ref 0.0–0.5)
LYMPH%: 21.9 % (ref 14.0–49.7)
MCHC: 32.8 g/dL (ref 31.5–36.0)
MONO#: 0.7 10*3/uL (ref 0.1–0.9)
MONO%: 10.9 % (ref 0.0–14.0)
NEUT#: 4.1 10*3/uL (ref 1.5–6.5)
Platelets: 819 10*3/uL — ABNORMAL HIGH (ref 145–400)
RBC: 4.56 10*6/uL (ref 3.70–5.45)
RDW: 14.4 % (ref 11.2–14.5)
WBC: 6.4 10*3/uL (ref 3.9–10.3)
nRBC: 0 % (ref 0–0)

## 2011-07-30 NOTE — Telephone Encounter (Signed)
Opened in error

## 2011-08-05 ENCOUNTER — Other Ambulatory Visit (HOSPITAL_BASED_OUTPATIENT_CLINIC_OR_DEPARTMENT_OTHER): Payer: BC Managed Care – PPO | Admitting: Lab

## 2011-08-05 DIAGNOSIS — D473 Essential (hemorrhagic) thrombocythemia: Secondary | ICD-10-CM

## 2011-08-05 LAB — CBC WITH DIFFERENTIAL/PLATELET
Basophils Absolute: 0.1 10*3/uL (ref 0.0–0.1)
Eosinophils Absolute: 0.1 10*3/uL (ref 0.0–0.5)
HGB: 11.8 g/dL (ref 11.6–15.9)
LYMPH%: 21.5 % (ref 14.0–49.7)
MONO#: 0.5 10*3/uL (ref 0.1–0.9)
NEUT#: 3.5 10*3/uL (ref 1.5–6.5)
Platelets: 794 10*3/uL — ABNORMAL HIGH (ref 145–400)
RBC: 4.5 10*6/uL (ref 3.70–5.45)
RDW: 14.8 % — ABNORMAL HIGH (ref 11.2–14.5)
WBC: 5.3 10*3/uL (ref 3.9–10.3)
nRBC: 0 % (ref 0–0)

## 2011-08-12 ENCOUNTER — Other Ambulatory Visit: Payer: BC Managed Care – PPO | Admitting: Lab

## 2011-08-13 ENCOUNTER — Ambulatory Visit (HOSPITAL_BASED_OUTPATIENT_CLINIC_OR_DEPARTMENT_OTHER): Payer: BC Managed Care – PPO | Admitting: Hematology & Oncology

## 2011-08-13 ENCOUNTER — Other Ambulatory Visit (HOSPITAL_BASED_OUTPATIENT_CLINIC_OR_DEPARTMENT_OTHER): Payer: BC Managed Care – PPO | Admitting: Lab

## 2011-08-13 VITALS — BP 133/86 | HR 98 | Temp 98.7°F | Ht 70.0 in | Wt 167.0 lb

## 2011-08-13 DIAGNOSIS — D473 Essential (hemorrhagic) thrombocythemia: Secondary | ICD-10-CM

## 2011-08-13 LAB — CBC WITH DIFFERENTIAL (CANCER CENTER ONLY)
BASO#: 0 10*3/uL (ref 0.0–0.2)
HCT: 35.5 % (ref 34.8–46.6)
HGB: 11.8 g/dL (ref 11.6–15.9)
LYMPH#: 1.1 10*3/uL (ref 0.9–3.3)
LYMPH%: 20.8 % (ref 14.0–48.0)
MCV: 78 fL — ABNORMAL LOW (ref 81–101)
MONO#: 0.7 10*3/uL (ref 0.1–0.9)
NEUT%: 64.2 % (ref 39.6–80.0)
WBC: 5.3 10*3/uL (ref 3.9–10.0)

## 2011-08-13 NOTE — Progress Notes (Signed)
This office note has been dictated.

## 2011-08-14 NOTE — Progress Notes (Signed)
CC:   Alexandra Mends. Panosh, MD  DIAGNOSIS:  Essential thrombocythemia, JAK2 negative.  CURRENT THERAPY: 1. Anagrelide 4 mg p.o. daily. 2. Aspirin 81 mg p.o. daily.  INTERIM HISTORY:  Alexandra [name] comes in for followup.  She is feeling fairly well.  She has had no complaints since we last saw her.  She has not noted any problems with bleeding or bruising.  There are no headaches.  She has had no diarrhea.  Her stools may be a little bit loose on occasion.  There have been no rashes.  There has been no ecchymosis.  She has had no leg swelling.  PHYSICAL EXAMINATION:  This is a well-developed, well-nourished [place] American female in no obvious distress.  Vital signs:  Temperature of 97, pulse 90, respiratory rate 20, blood pressure 133/86.  Weight is 167.  Head and neck:  Normocephalic, atraumatic skull.  There are no ocular or oral lesions.  There are no palpable cervical or supraclavicular lymph nodes.  Lungs:  Clear bilaterally.  Cardiac: Regular rate and rhythm with normal S1 and S2.  There are no murmurs, rubs or bruits.  Abdomen:  Soft with good bowel sounds.  There is no palpable abdominal mass.  There is no fluid wave.  There is no palpable hepatosplenomegaly.  Extremities:  No clubbing, cyanosis or edema. Skin:  No rashes, ecchymosis or petechia.  Neurologic:  No focal neurological deficits.  LABORATORY STUDIES:  White cell count is 5.3, hemoglobin 11.8, hematocrit 35.5, platelet count 734.  MCV is 78.  IMPRESSION:  Alexandra [name] is a nice 61 year old African American female with essential thrombocythemia.  She is on anagrelide.  We have had to adjust the anagrelide dose upward in order to help control her thrombocytosis.  Will go ahead and continue to follow her along weekly with lab work. She feels most comfortable doing weekly lab work.  I will plan to get her back myself in 2 months' time.    ______________________________ Josph Macho, M.D. PRE/MEDQ  D:  08/13/2011   T:  08/14/2011  Job:  [id number]

## 2011-08-15 ENCOUNTER — Other Ambulatory Visit: Payer: Self-pay | Admitting: Internal Medicine

## 2011-08-17 ENCOUNTER — Other Ambulatory Visit: Payer: Self-pay | Admitting: Family Medicine

## 2011-08-17 MED ORDER — AMLODIPINE BESYLATE 5 MG PO TABS: 5.0000 mg | ORAL_TABLET | Freq: Every day | ORAL | Status: DC

## 2011-08-17 NOTE — Telephone Encounter (Signed)
Medicaiton has been filled and sent to [facility].

## 2011-08-19 ENCOUNTER — Other Ambulatory Visit (HOSPITAL_BASED_OUTPATIENT_CLINIC_OR_DEPARTMENT_OTHER): Payer: BC Managed Care – PPO | Admitting: Lab

## 2011-08-19 DIAGNOSIS — D473 Essential (hemorrhagic) thrombocythemia: Secondary | ICD-10-CM

## 2011-08-19 LAB — CBC WITH DIFFERENTIAL/PLATELET
Basophils Absolute: 0 10*3/uL (ref 0.0–0.1)
EOS%: 1.1 % (ref 0.0–7.0)
HCT: 35.1 % (ref 34.8–46.6)
HGB: 11.6 g/dL (ref 11.6–15.9)
LYMPH%: 19.6 % (ref 14.0–49.7)
MCH: 26.1 pg (ref 25.1–34.0)
MCV: 79 fL — ABNORMAL LOW (ref 79.5–101.0)
MONO%: 10.7 % (ref 0.0–14.0)
NEUT%: 67.8 % (ref 38.4–76.8)
Platelets: 746 10*3/uL — ABNORMAL HIGH (ref 145–400)
lymph#: 1.2 10*3/uL (ref 0.9–3.3)

## 2011-08-26 ENCOUNTER — Other Ambulatory Visit (HOSPITAL_BASED_OUTPATIENT_CLINIC_OR_DEPARTMENT_OTHER): Payer: BC Managed Care – PPO | Admitting: Lab

## 2011-08-26 DIAGNOSIS — D473 Essential (hemorrhagic) thrombocythemia: Secondary | ICD-10-CM

## 2011-08-26 LAB — CBC WITH DIFFERENTIAL/PLATELET
Basophils Absolute: 0.1 10*3/uL (ref 0.0–0.1)
EOS%: 1.5 % (ref 0.0–7.0)
HGB: 11.4 g/dL — ABNORMAL LOW (ref 11.6–15.9)
MCH: 26.2 pg (ref 25.1–34.0)
MCHC: 33.3 g/dL (ref 31.5–36.0)
MCV: 78.8 fL — ABNORMAL LOW (ref 79.5–101.0)
MONO%: 12.5 % (ref 0.0–14.0)
RDW: 15.5 % — ABNORMAL HIGH (ref 11.2–14.5)

## 2011-09-02 ENCOUNTER — Encounter: Payer: Self-pay | Admitting: Dietician

## 2011-09-02 ENCOUNTER — Other Ambulatory Visit (HOSPITAL_BASED_OUTPATIENT_CLINIC_OR_DEPARTMENT_OTHER): Payer: BC Managed Care – PPO | Admitting: Lab

## 2011-09-02 DIAGNOSIS — D473 Essential (hemorrhagic) thrombocythemia: Secondary | ICD-10-CM

## 2011-09-02 LAB — CBC WITH DIFFERENTIAL/PLATELET
EOS%: 1.2 % (ref 0.0–7.0)
Eosinophils Absolute: 0.1 10*3/uL (ref 0.0–0.5)
MCH: 26 pg (ref 25.1–34.0)
MCV: 78.6 fL — ABNORMAL LOW (ref 79.5–101.0)
MONO%: 11.8 % (ref 0.0–14.0)
NEUT#: 4.3 10*3/uL (ref 1.5–6.5)
RBC: 4.38 10*6/uL (ref 3.70–5.45)
RDW: 15 % — ABNORMAL HIGH (ref 11.2–14.5)

## 2011-09-02 NOTE — Progress Notes (Signed)
Brief Out-patient Oncology Nutrition Note  Reason: Patient screened positive for nutrition risk for unintentional weight loss and decreased appetite.   Alexandra [name] is a 61 year old female patient of Dr. Twanna Hy, diagnosed with essential thrombocythemia. Attempted to contact patient via telephone for nutrition risk. Left patient voice mail with RD contact information.   Wt Readings from Last 10 Encounters:  08/13/11 167 lb (75.751 kg)  07/13/11 168 lb (76.204 kg)  06/08/11 173 lb (78.472 kg)  05/08/11 175 lb 8 oz (79.606 kg)  04/09/11 178 lb (80.74 kg)  03/30/11 180 lb (81.647 kg)  03/09/11 180 lb (81.647 kg)  02/27/11 183 lb (83.008 kg)  08/05/10 186 lb (84.369 kg)  04/29/10 185 lb (83.915 kg)  *Unintentional weight loss of 13 lb over 5 months, 7.2% from baseline.   RD available for nutrition needs.   Iven Finn [facility] [phone]

## 2011-09-09 ENCOUNTER — Other Ambulatory Visit (HOSPITAL_BASED_OUTPATIENT_CLINIC_OR_DEPARTMENT_OTHER): Payer: BC Managed Care – PPO | Admitting: Lab

## 2011-09-09 DIAGNOSIS — D473 Essential (hemorrhagic) thrombocythemia: Secondary | ICD-10-CM

## 2011-09-09 LAB — CBC WITH DIFFERENTIAL/PLATELET
BASO%: 0.6 % (ref 0.0–2.0)
EOS%: 1.9 % (ref 0.0–7.0)
MCH: 25.7 pg (ref 25.1–34.0)
MCHC: 32.4 g/dL (ref 31.5–36.0)
MONO#: 0.7 10*3/uL (ref 0.1–0.9)
RDW: 15.4 % — ABNORMAL HIGH (ref 11.2–14.5)
WBC: 6.6 10*3/uL (ref 3.9–10.3)
lymph#: 1.6 10*3/uL (ref 0.9–3.3)

## 2011-09-23 ENCOUNTER — Other Ambulatory Visit (HOSPITAL_BASED_OUTPATIENT_CLINIC_OR_DEPARTMENT_OTHER): Payer: BC Managed Care – PPO | Admitting: Lab

## 2011-09-23 DIAGNOSIS — D473 Essential (hemorrhagic) thrombocythemia: Secondary | ICD-10-CM

## 2011-09-23 LAB — CBC WITH DIFFERENTIAL/PLATELET
BASO%: 0.2 % (ref 0.0–2.0)
LYMPH%: 21.4 % (ref 14.0–49.7)
MCHC: 33.3 g/dL (ref 31.5–36.0)
MONO#: 0.7 10*3/uL (ref 0.1–0.9)
NEUT#: 4.1 10*3/uL (ref 1.5–6.5)
Platelets: 808 10*3/uL — ABNORMAL HIGH (ref 145–400)
RBC: 4.31 10*6/uL (ref 3.70–5.45)
RDW: 15.8 % — ABNORMAL HIGH (ref 11.2–14.5)
WBC: 6.2 10*3/uL (ref 3.9–10.3)
lymph#: 1.3 10*3/uL (ref 0.9–3.3)

## 2011-10-06 ENCOUNTER — Telehealth: Payer: Self-pay | Admitting: Hematology & Oncology

## 2011-10-06 ENCOUNTER — Ambulatory Visit (HOSPITAL_BASED_OUTPATIENT_CLINIC_OR_DEPARTMENT_OTHER): Payer: BC Managed Care – PPO | Admitting: Hematology & Oncology

## 2011-10-06 ENCOUNTER — Other Ambulatory Visit (HOSPITAL_BASED_OUTPATIENT_CLINIC_OR_DEPARTMENT_OTHER): Payer: BC Managed Care – PPO | Admitting: Lab

## 2011-10-06 VITALS — BP 139/88 | HR 80 | Temp 97.8°F | Resp 20 | Ht 70.0 in | Wt 165.0 lb

## 2011-10-06 DIAGNOSIS — D473 Essential (hemorrhagic) thrombocythemia: Secondary | ICD-10-CM

## 2011-10-06 LAB — CBC WITH DIFFERENTIAL (CANCER CENTER ONLY)
BASO%: 0.7 % (ref 0.0–2.0)
Eosinophils Absolute: 0.1 10*3/uL (ref 0.0–0.5)
HCT: 35.8 % (ref 34.8–46.6)
LYMPH#: 0.8 10*3/uL — ABNORMAL LOW (ref 0.9–3.3)
MONO#: 1 10*3/uL — ABNORMAL HIGH (ref 0.1–0.9)
NEUT%: 53.4 % (ref 39.6–80.0)
RBC: 4.48 10*6/uL (ref 3.70–5.32)
RDW: 16.1 % — ABNORMAL HIGH (ref 11.1–15.7)
WBC: 4 10*3/uL (ref 3.9–10.0)

## 2011-10-06 NOTE — Progress Notes (Signed)
This office note has been dictated.

## 2011-10-06 NOTE — Telephone Encounter (Signed)
Per order to sch appts.  appts were sch and mailed out to patient's home

## 2011-10-06 NOTE — Progress Notes (Signed)
CC:   Neta Mends. Panosh, MD  DIAGNOSIS:  Essential thrombocythemia, JAK2 negative.  CURRENT THERAPY:  Anagrelide 4 mg p.o. daily, aspirin 81 mg p.o. daily.  INTERIM HISTORY:  Ms. Jahn comes in for her followup.  She is feeling well.  She has had no specific complaints.  She is doing well with the anagrelide.  She is not having any type of diarrhea.  She is not having any rashes.  There have been no fevers, sweats or chills.  She is still working.  She has had no known headaches.  PHYSICAL EXAMINATION:  This is a well-developed, well-nourished black female in no obvious distress.  Vital signs:  97.8, pulse 80, respiratory rate 18, blood pressure 139/80.  Weight is 165.  Head and neck:  Normocephalic, atraumatic skull.  There are no ocular or oral lesions.  There are no palpable cervical or supraclavicular lymph nodes. Lungs:  Clear bilaterally.  Cardiac:  Regular rate and rhythm with a normal S1 and S2.  There are no murmurs, rubs or bruits.  Abdomen:  Soft with good bowel sounds.  There is no palpable abdominal mass.  There is no fluid wave.  There is no palpable hepatosplenomegaly.  Back:  No tenderness over the spine, ribs, or hips.  Extremities:  No clubbing, cyanosis or edema.  Neurological:  No focal neurological deficits.  LABORATORY STUDIES:  White cell count is 4, hemoglobin 11.7, hematocrit 35.8, platelet count 691.  IMPRESSION:  Ms. Rohrig is a 61 year old African American female with essential thrombocythemia.  She has done well with the anagrelide.  Her platelet count has been coming down slowly.  Her platelets do tend to "bump" up and down.  We are not going to make any changes with her anagrelide dose for right now.  She will continue on the aspirin.  I think we can move her appointments out to every 3 weeks for lab work. I will plan to see her back myself in about 8 weeks or so.    ______________________________ Josph Macho, M.D. PRE/MEDQ  D:  10/06/2011   T:  10/06/2011  Job:  [id number]

## 2011-10-07 ENCOUNTER — Other Ambulatory Visit: Payer: BC Managed Care – PPO | Admitting: Lab

## 2011-10-07 ENCOUNTER — Ambulatory Visit: Payer: BC Managed Care – PPO | Admitting: Hematology & Oncology

## 2011-10-19 ENCOUNTER — Other Ambulatory Visit: Payer: Self-pay | Admitting: *Deleted

## 2011-10-28 ENCOUNTER — Other Ambulatory Visit (HOSPITAL_BASED_OUTPATIENT_CLINIC_OR_DEPARTMENT_OTHER): Payer: BC Managed Care – PPO | Admitting: Lab

## 2011-10-28 DIAGNOSIS — D473 Essential (hemorrhagic) thrombocythemia: Secondary | ICD-10-CM

## 2011-10-28 LAB — CBC WITH DIFFERENTIAL/PLATELET
BASO%: 1 % (ref 0.0–2.0)
EOS%: 1.5 % (ref 0.0–7.0)
LYMPH%: 23.1 % (ref 14.0–49.7)
MCH: 25.7 pg (ref 25.1–34.0)
MCHC: 32.3 g/dL (ref 31.5–36.0)
MCV: 79.7 fL (ref 79.5–101.0)
MONO%: 11.1 % (ref 0.0–14.0)
Platelets: 729 10*3/uL — ABNORMAL HIGH (ref 145–400)
RBC: 4.68 10*6/uL (ref 3.70–5.45)
RDW: 16.3 % — ABNORMAL HIGH (ref 11.2–14.5)

## 2011-11-18 ENCOUNTER — Other Ambulatory Visit (HOSPITAL_BASED_OUTPATIENT_CLINIC_OR_DEPARTMENT_OTHER): Payer: BC Managed Care – PPO | Admitting: Lab

## 2011-11-18 DIAGNOSIS — D473 Essential (hemorrhagic) thrombocythemia: Secondary | ICD-10-CM

## 2011-11-18 LAB — CBC WITH DIFFERENTIAL/PLATELET
Basophils Absolute: 0.1 10*3/uL (ref 0.0–0.1)
Eosinophils Absolute: 0.1 10*3/uL (ref 0.0–0.5)
HGB: 10.8 g/dL — ABNORMAL LOW (ref 11.6–15.9)
NEUT#: 3.6 10*3/uL (ref 1.5–6.5)
RDW: 16.5 % — ABNORMAL HIGH (ref 11.2–14.5)
lymph#: 1.3 10*3/uL (ref 0.9–3.3)

## 2011-12-02 ENCOUNTER — Other Ambulatory Visit: Payer: Self-pay | Admitting: Internal Medicine

## 2011-12-02 MED ORDER — LISINOPRIL 20 MG PO TABS: 40.0000 mg | ORAL_TABLET | Freq: Every day | ORAL | Status: DC

## 2011-12-09 ENCOUNTER — Ambulatory Visit: Payer: BC Managed Care – PPO | Admitting: Hematology & Oncology

## 2011-12-09 ENCOUNTER — Other Ambulatory Visit: Payer: BC Managed Care – PPO | Admitting: Lab

## 2011-12-11 ENCOUNTER — Other Ambulatory Visit (HOSPITAL_BASED_OUTPATIENT_CLINIC_OR_DEPARTMENT_OTHER): Payer: BC Managed Care – PPO | Admitting: Lab

## 2011-12-11 ENCOUNTER — Ambulatory Visit (HOSPITAL_BASED_OUTPATIENT_CLINIC_OR_DEPARTMENT_OTHER): Payer: BC Managed Care – PPO | Admitting: Hematology & Oncology

## 2011-12-11 VITALS — BP 127/71 | HR 104 | Temp 97.8°F | Resp 16 | Ht 70.0 in | Wt 164.0 lb

## 2011-12-11 DIAGNOSIS — D473 Essential (hemorrhagic) thrombocythemia: Secondary | ICD-10-CM

## 2011-12-11 DIAGNOSIS — E611 Iron deficiency: Secondary | ICD-10-CM

## 2011-12-11 LAB — CBC WITH DIFFERENTIAL (CANCER CENTER ONLY)
BASO#: 0 10*3/uL (ref 0.0–0.2)
Eosinophils Absolute: 0.1 10*3/uL (ref 0.0–0.5)
HCT: 35.7 % (ref 34.8–46.6)
HGB: 11.6 g/dL (ref 11.6–15.9)
LYMPH%: 20.2 % (ref 14.0–48.0)
MCH: 26.1 pg (ref 26.0–34.0)
MCV: 80 fL — ABNORMAL LOW (ref 81–101)
MONO%: 8.8 % (ref 0.0–13.0)
NEUT#: 5 10*3/uL (ref 1.5–6.5)
RBC: 4.44 10*6/uL (ref 3.70–5.32)

## 2011-12-11 LAB — IRON AND TIBC
%SAT: 30 % (ref 20–55)
Iron: 72 ug/dL (ref 42–145)

## 2011-12-11 NOTE — Progress Notes (Signed)
This office note has been dictated.

## 2011-12-12 NOTE — Progress Notes (Signed)
CC:   Neta Mends. Panosh, MD  DIAGNOSIS:  Essential thrombocythemia - JAK2 negative.  CURRENT THERAPY: 1. Anagrelide 4 mg p.o. daily. 2. Aspirin 81 mg p.o. daily.  INTERIM HISTORY:  [name] comes in for followup.  She is doing well. We last saw her back in August.  She has had no problems with headache. There is no leg swelling.  There have been no rashes.  There has been no abdominal pain.  She has had no change in bowel or bladder habits.  She has done well with the anagrelide dose.  This is a fairly "hefty" dose.  PHYSICAL EXAMINATION:  This is a well-developed, well-nourished black female in no obvious distress.  Vital signs:  Temperature of 97.8, pulse 104, respiratory rate 16, blood pressure 127/71.  Weight is 164. Head/neck:  Normocephalic, atraumatic skull.  There are no ocular or oral lesions.  There are no palpable cervical or supraclavicular lymph nodes.  Lungs:  Clear bilaterally.  Cardiac:  Regular rate and rhythm with a normal S1 and S2.  There are no murmurs, rubs or bruits. Abdomen:  Soft with good bowel sounds.  There is no palpable abdominal mass.  There is no palpable hepatosplenomegaly.  Back:  No tenderness of the spine, ribs, or hips.  Extremities:  No clubbing, cyanosis or edema. Neurological:  No focal neurological deficits.  Skin:  No rashes, ecchymosis, or petechia.  LABORATORY STUDIES:  White cell count is 7.3, hemoglobin 11.6, hematocrit 35.7, platelet count 85.  MCV is 80.  IMPRESSION:  [name] is a 61 year old black female with essential thrombocythemia.  She still has moderate thrombocytosis.  With her low MCV, I am checking her iron studies.  I did give her a prescription for some oral iron.  I do not think it would be a bad idea to try her on some oral iron.  We will continue to check her blood counts every 3 weeks.  I will plan to see her back in 6 weeks.   ______________________________ Josph Macho, M.D. PRE/MEDQ  D:  12/11/2011   T:  12/11/2011  Job:  [id number]

## 2011-12-30 ENCOUNTER — Other Ambulatory Visit (HOSPITAL_BASED_OUTPATIENT_CLINIC_OR_DEPARTMENT_OTHER): Payer: BC Managed Care – PPO | Admitting: Lab

## 2011-12-30 DIAGNOSIS — D473 Essential (hemorrhagic) thrombocythemia: Secondary | ICD-10-CM

## 2011-12-30 LAB — CBC WITH DIFFERENTIAL/PLATELET
BASO%: 0.9 % (ref 0.0–2.0)
Basophils Absolute: 0.1 10*3/uL (ref 0.0–0.1)
EOS%: 1.6 % (ref 0.0–7.0)
HGB: 10.6 g/dL — ABNORMAL LOW (ref 11.6–15.9)
MCH: 26.5 pg (ref 25.1–34.0)
MONO#: 0.7 10*3/uL (ref 0.1–0.9)
RDW: 16.1 % — ABNORMAL HIGH (ref 11.2–14.5)
WBC: 6.2 10*3/uL (ref 3.9–10.3)
lymph#: 1.3 10*3/uL (ref 0.9–3.3)

## 2012-01-04 ENCOUNTER — Ambulatory Visit (INDEPENDENT_AMBULATORY_CARE_PROVIDER_SITE_OTHER): Payer: BC Managed Care – PPO | Admitting: Internal Medicine

## 2012-01-04 ENCOUNTER — Encounter: Payer: Self-pay | Admitting: Internal Medicine

## 2012-01-04 VITALS — BP 148/96 | HR 88 | Temp 97.5°F | Wt 164.0 lb

## 2012-01-04 DIAGNOSIS — D473 Essential (hemorrhagic) thrombocythemia: Secondary | ICD-10-CM

## 2012-01-04 DIAGNOSIS — I10 Essential (primary) hypertension: Secondary | ICD-10-CM

## 2012-01-04 DIAGNOSIS — R7309 Other abnormal glucose: Secondary | ICD-10-CM

## 2012-01-04 LAB — HEPATIC FUNCTION PANEL
ALT: 17 U/L (ref 0–35)
AST: 20 U/L (ref 0–37)
Bilirubin, Direct: 0.1 mg/dL (ref 0.0–0.3)
Total Bilirubin: 0.7 mg/dL (ref 0.3–1.2)

## 2012-01-04 LAB — BASIC METABOLIC PANEL
BUN: 13 mg/dL (ref 6–23)
CO2: 29 mEq/L (ref 19–32)
Chloride: 106 mEq/L (ref 96–112)
Potassium: 3.6 mEq/L (ref 3.5–5.1)

## 2012-01-04 LAB — LIPID PANEL: Total CHOL/HDL Ratio: 4

## 2012-01-04 LAB — TSH: TSH: 1.19 u[IU]/mL (ref 0.35–5.50)

## 2012-01-04 MED ORDER — LISINOPRIL 20 MG PO TABS: 40.0000 mg | ORAL_TABLET | Freq: Every day | ORAL | Status: DC

## 2012-01-04 MED ORDER — AMLODIPINE BESYLATE 5 MG PO TABS
5.0000 mg | ORAL_TABLET | Freq: Every day | ORAL | Status: DC
Start: 2012-01-04 — End: 2013-02-05

## 2012-01-04 MED ORDER — NEBIVOLOL HCL 20 MG PO TABS: 1.0000 | ORAL_TABLET | Freq: Every morning | ORAL | Status: DC

## 2012-01-04 NOTE — Progress Notes (Signed)
Chief Complaint  Patient presents with  . Follow-up  . Hypertension    HPI: Last visit  With me was almost a year ago. She has been under rx for  Hypertension and elevated BG readings .Since her last visit here she has been doing well. Losing weight  To help with blood sugar.   Under care for thrombocythemia. On agrylin now . On triple therapy for her bp . ROS: See pertinent positives and negatives per HPI.working no cp sob syncope . Neuro sx .   Past Medical History  Diagnosis Date  . Hypertension     echo   . Thrombocytosis     Family History  Problem Relation Age of Onset  . Heart disease Mother     died age 10  . Heart attack Father 32    massive  . Hypertension Sister   . Hypertension Sister   . Hypertension Sister   . Hypertension Sister   . Arthritis      family hx  . Diabetes      family hx  . Kidney disease Mother     family hx  . Stroke       dialysis    History   Social History  . Marital Status: Married    Spouse Name: N/A    Number of Children: N/A  . Years of Education: N/A   Social History Main Topics  . Smoking status: Never Smoker   . Smokeless tobacco: None  . Alcohol Use: No  . Drug Use: No  . Sexually Active: None   Other Topics Concern  . 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 Pt takes 4 tabs daily, Disp: , Rfl: ;  Fe Fum-FePoly-Vit C-Vit B3 (INTEGRA) 62.5-62.5-40-3 MG CAPS, Take 1 capsule by mouth daily., Disp: , Rfl: ;  lisinopril (PRINIVIL,ZESTRIL) 20 MG tablet, Take 2 tablets (40 mg total) by mouth daily., Disp: 180 tablet, Rfl: 3;  Multiple Vitamin (MULTI-VITAMIN DAILY PO), Take by mouth every morning., Disp: , Rfl:  Nebivolol HCl (BYSTOLIC) 20 MG TABS, Take 1 tablet (20 mg total) by mouth every morning., Disp: 30 tablet, Rfl: 11;  amLODipine (NORVASC) 5 MG tablet, Take 1 tablet (5 mg total) by mouth daily., Disp:  90 tablet, Rfl: 3  EXAM: BP 148/96  Pulse 88  Temp 97.5 F (36.4 C) (Oral)  Wt 164 lb (74.39 kg)  SpO2 97%  GENERAL: vitals reviewed and listed above, alert, oriented, appears well hydrated and in no acute distress  HEENT: atraumatic, conjunttiva clear, no obvious abnormalities on inspection of external nose and ears  NECK: no obvious masses on inspection palpation  No bruit  LUNGS: clear to auscultation bilaterally, no wheezes, rales or rhonchi, good air movement CV: HRRR, no clubbing cyanosis or  peripheral edema nl cap refill  Abd:  No masses  MS: moves all extremities without noticeable focal  abnormality PSYCH: pleasant and cooperative, no obvious depression or anxiety Neuro : NON focal exam   ASSESSMENT AND PLAN: Discussed the following assessment and plan: 1. THROMBOCYTHEMIA  Fe Fum-FePoly-Vit C-Vit B3 (INTEGRA) 62.5-62.5-40-3 MG CAPS, Basic metabolic panel, Hepatic function panel, Lipid panel, TSH, Hemoglobin A1c  2. HYPERTENSION  Fe Fum-FePoly-Vit C-Vit B3 (INTEGRA) 62.5-62.5-40-3 MG CAPS, Basic metabolic panel, Hepatic function panel, Lipid panel, TSH, Hemoglobin A1c   had been good hard to manage in past  no change at  thie stime check readingsat home   3. HYPERGLYCEMIA  Fe Fum-FePoly-Vit C-Vit B3 (INTEGRA) 62.5-62.5-40-3 MG CAPS, Basic metabolic panel, Hepatic function panel, Lipid panel, TSH, Hemoglobin A1c   hopefully improved  with lsi   preventing counseling   decline flu vaccine  considering colonoscopy.  -Patient advised to return if or new concerns arise. And fu wellness .   Patient Instructions  Continue lifestyle intervention healthy eating and exercise . Monitor your BLood pressure to ensure it is in control. Get enough sleep.  Reconsider the flu vaccine. Get a colonoscopy. ROV  depending on readings . Or wellness. In 3-4 months    Lorretta Harp

## 2012-01-04 NOTE — Patient Instructions (Signed)
Continue lifestyle intervention healthy eating and exercise . Monitor your BLood pressure to ensure it is in control. Get enough sleep.  Reconsider the flu vaccine. Get a colonoscopy. ROV  depending on readings . Or wellness. In 3-4 months

## 2012-01-06 ENCOUNTER — Telehealth: Payer: Self-pay | Admitting: Family Medicine

## 2012-01-06 NOTE — Telephone Encounter (Signed)
Just the BMP and hg a1c

## 2012-01-06 NOTE — Telephone Encounter (Signed)
The pt is scheduled for labs and cpe in Feb.  Do you want a full set of labs or just the bmp and a1c.  Looks like pt had a full set recently.  Please advise.  Thanks!!

## 2012-01-07 ENCOUNTER — Other Ambulatory Visit: Payer: Self-pay | Admitting: Family Medicine

## 2012-01-07 DIAGNOSIS — I10 Essential (primary) hypertension: Secondary | ICD-10-CM

## 2012-01-07 DIAGNOSIS — R739 Hyperglycemia, unspecified: Secondary | ICD-10-CM

## 2012-01-07 NOTE — Telephone Encounter (Signed)
Placed in the system

## 2012-01-20 ENCOUNTER — Other Ambulatory Visit: Payer: BC Managed Care – PPO | Admitting: Lab

## 2012-01-20 ENCOUNTER — Other Ambulatory Visit: Payer: Self-pay | Admitting: Hematology & Oncology

## 2012-01-20 DIAGNOSIS — D473 Essential (hemorrhagic) thrombocythemia: Secondary | ICD-10-CM

## 2012-01-20 MED ORDER — ANAGRELIDE HCL 1 MG PO CAPS: 4.0000 mg | ORAL_CAPSULE | Freq: Every day | ORAL | Status: DC

## 2012-01-22 ENCOUNTER — Other Ambulatory Visit (HOSPITAL_BASED_OUTPATIENT_CLINIC_OR_DEPARTMENT_OTHER): Payer: BC Managed Care – PPO | Admitting: Lab

## 2012-01-22 ENCOUNTER — Ambulatory Visit (HOSPITAL_BASED_OUTPATIENT_CLINIC_OR_DEPARTMENT_OTHER): Payer: BC Managed Care – PPO | Admitting: Hematology & Oncology

## 2012-01-22 VITALS — BP 149/87 | HR 74 | Temp 97.6°F | Resp 16 | Ht 70.0 in | Wt 163.0 lb

## 2012-01-22 DIAGNOSIS — D473 Essential (hemorrhagic) thrombocythemia: Secondary | ICD-10-CM

## 2012-01-22 DIAGNOSIS — D509 Iron deficiency anemia, unspecified: Secondary | ICD-10-CM

## 2012-01-22 LAB — IRON AND TIBC
%SAT: 30 % (ref 20–55)
TIBC: 246 ug/dL — ABNORMAL LOW (ref 250–470)

## 2012-01-22 LAB — FERRITIN: Ferritin: 218 ng/mL (ref 10–291)

## 2012-01-22 LAB — CBC WITH DIFFERENTIAL (CANCER CENTER ONLY)
EOS%: 1.4 % (ref 0.0–7.0)
MCH: 26 pg (ref 26.0–34.0)
MCHC: 32.4 g/dL (ref 32.0–36.0)
MONO%: 9.4 % (ref 0.0–13.0)
NEUT#: 4.6 10*3/uL (ref 1.5–6.5)
Platelets: 864 10*3/uL — ABNORMAL HIGH (ref 145–400)

## 2012-01-22 LAB — CHCC SATELLITE - SMEAR

## 2012-01-22 NOTE — Progress Notes (Signed)
CC:   Neta Mends. Panosh, MD  DIAGNOSIS:  Essential thrombocythemia, JAK2 negative.  CURRENT THERAPY: 1. Anagrelide 4 mg p.o. daily. 2. Aspirin 81 mg p.o. daily.  INTERIM HISTORY:  Alexandra [name] comes in for her followup.  She is doing well.  She has had no complaints since we last saw her.  She is on anagrelide.  This has not caused her too much side effects or toxicity.  There has been no leg swelling.  She has had no rashes.  She has had no change in bowel or bladder habits.  She denies any burning in the hands or feet.  There is no headache.  She has had no bleeding or bruising.  PHYSICAL EXAMINATION:  General:  This is a well-developed, well- nourished African American female in no obvious distress.  Vital signs: Show a temperature of 97.6, pulse 74, respiratory rate 16, blood pressure 149/87.  Weight is 163.  Head and neck:  Shows a normocephalic, atraumatic skull.  There are no ocular or oral lesions.  There are no palpable cervical or supraclavicular lymph nodes.  Lungs:  Clear bilaterally.  Cardiac:  Regular rate and rhythm with a normal S1 and S2. There are no murmurs, rubs or bruits.  Abdomen:  Soft with good bowel sounds.  There is no palpable abdominal mass.  There is no fluid wave. There is no palpable hepatosplenomegaly.  Extremities:  Shows no clubbing, cyanosis or edema.  Neurologic:  No focal neurological deficits.  LABORATORY STUDIES:  White cell count is 6.5, hemoglobin 11.5, hematocrit 35.5, platelet count 864.  MCV is 80.  IMPRESSION:  Alexandra [name] is a 61 year old African American female with essential thrombocythemia.  We have been following her now for I think about 7 or 8 years.  She is on oral iron.  We will see what her iron studies show today.  I suppose that one possibility would be giving her IV iron to try to help get her platelet count down if some of her thrombocytosis is from iron deficiency.  I am going to want to consider a second opinion, as this  is not straightforward.  I will see if Dr. Marcheta Grammes from [facility] can see her.  I think that Dr. Marcheta Grammes could provide some good expertise.  It is possible that we just need to increase the dose of anagrelide more.  It is also possible that we may need to add Hydrea back.  Ms. Whobrey is having her blood work done every 3 weeks.  I will see her back in 9 weeks.    ______________________________ Josph Macho, M.D. PRE/MEDQ  D:  01/22/2012  T:  01/22/2012  Job:  [id number]

## 2012-01-22 NOTE — Progress Notes (Signed)
This office note has been dictated.

## 2012-01-25 ENCOUNTER — Telehealth: Payer: Self-pay | Admitting: *Deleted

## 2012-01-25 NOTE — Telephone Encounter (Signed)
Called Alexandra [name] (coordinator for Dr. Kerry Dory) at [facility].  Making referral for patient to see Dr. Marcheta Grammes regarding her thrombocythemia.  Lab results, progress notes and pathology faxed to Dr. Marcheta Grammes.  No openings until January.  Alexandra [name] is going to talk to Dr. Marcheta Grammes to try to see where to get her in to see Dr. Stann Mainland call us back

## 2012-02-10 ENCOUNTER — Other Ambulatory Visit (HOSPITAL_BASED_OUTPATIENT_CLINIC_OR_DEPARTMENT_OTHER): Payer: BC Managed Care – PPO | Admitting: Lab

## 2012-02-10 DIAGNOSIS — D509 Iron deficiency anemia, unspecified: Secondary | ICD-10-CM

## 2012-02-10 DIAGNOSIS — D473 Essential (hemorrhagic) thrombocythemia: Secondary | ICD-10-CM

## 2012-02-10 LAB — CBC WITH DIFFERENTIAL/PLATELET
Basophils Absolute: 0.1 10*3/uL (ref 0.0–0.1)
Eosinophils Absolute: 0.1 10*3/uL (ref 0.0–0.5)
HGB: 10.8 g/dL — ABNORMAL LOW (ref 11.6–15.9)
NEUT#: 3.8 10*3/uL (ref 1.5–6.5)
RBC: 4.17 10*6/uL (ref 3.70–5.45)
RDW: 16.9 % — ABNORMAL HIGH (ref 11.2–14.5)
WBC: 6.1 10*3/uL (ref 3.9–10.3)
lymph#: 1.1 10*3/uL (ref 0.9–3.3)

## 2012-02-12 ENCOUNTER — Telehealth: Payer: Self-pay | Admitting: *Deleted

## 2012-02-12 NOTE — Telephone Encounter (Addendum)
Message copied by Mirian Capuchin on Fri Feb 12, 2012 12:19 PM ------      Message from: Arlan Organ R      Created: Thu Feb 11, 2012  4:27 PM       Call - platelets are better!!!  Good job!!!  Cindee Lame This message left on pt's personal cell home.

## 2012-03-03 ENCOUNTER — Other Ambulatory Visit (HOSPITAL_BASED_OUTPATIENT_CLINIC_OR_DEPARTMENT_OTHER): Payer: BC Managed Care – PPO | Admitting: Lab

## 2012-03-03 DIAGNOSIS — D473 Essential (hemorrhagic) thrombocythemia: Secondary | ICD-10-CM

## 2012-03-03 DIAGNOSIS — D509 Iron deficiency anemia, unspecified: Secondary | ICD-10-CM

## 2012-03-03 LAB — CBC WITH DIFFERENTIAL/PLATELET
BASO%: 0.6 % (ref 0.0–2.0)
Basophils Absolute: 0 10e3/uL (ref 0.0–0.1)
EOS%: 1.5 % (ref 0.0–7.0)
Eosinophils Absolute: 0.1 10e3/uL (ref 0.0–0.5)
HCT: 34 % — ABNORMAL LOW (ref 34.8–46.6)
HGB: 10.9 g/dL — ABNORMAL LOW (ref 11.6–15.9)
LYMPH%: 19.7 % (ref 14.0–49.7)
MCH: 25 pg — ABNORMAL LOW (ref 25.1–34.0)
MCHC: 32.1 g/dL (ref 31.5–36.0)
MCV: 78 fL — ABNORMAL LOW (ref 79.5–101.0)
MONO#: 0.7 10e3/uL (ref 0.1–0.9)
MONO%: 11 % (ref 0.0–14.0)
NEUT#: 4.5 10e3/uL (ref 1.5–6.5)
NEUT%: 67.2 % (ref 38.4–76.8)
Platelets: 708 10e3/uL — ABNORMAL HIGH (ref 145–400)
RBC: 4.36 10e6/uL (ref 3.70–5.45)
RDW: 16.8 % — ABNORMAL HIGH (ref 11.2–14.5)
WBC: 6.7 10e3/uL (ref 3.9–10.3)
lymph#: 1.3 10e3/uL (ref 0.9–3.3)
nRBC: 0 % (ref 0–0)

## 2012-03-07 ENCOUNTER — Telehealth: Payer: Self-pay | Admitting: *Deleted

## 2012-03-07 NOTE — Telephone Encounter (Signed)
Message copied by Anselm Jungling on Mon Mar 07, 2012 10:23 AM ------      Message from: Josph Macho      Created: Sat Mar 05, 2012 12:17 PM       Call - platelets still coming down!!!  Cindee Lame

## 2012-03-07 NOTE — Telephone Encounter (Signed)
Called patient to let her know that her platelets are coming down nicely per dr, Myna Hidalgo

## 2012-03-23 ENCOUNTER — Other Ambulatory Visit: Payer: BC Managed Care – PPO | Admitting: Lab

## 2012-03-23 ENCOUNTER — Other Ambulatory Visit (HOSPITAL_BASED_OUTPATIENT_CLINIC_OR_DEPARTMENT_OTHER): Payer: BC Managed Care – PPO | Admitting: Lab

## 2012-03-23 ENCOUNTER — Ambulatory Visit: Payer: BC Managed Care – PPO | Admitting: Hematology & Oncology

## 2012-03-23 ENCOUNTER — Ambulatory Visit (HOSPITAL_BASED_OUTPATIENT_CLINIC_OR_DEPARTMENT_OTHER): Payer: BC Managed Care – PPO | Admitting: Hematology & Oncology

## 2012-03-23 VITALS — BP 126/71 | HR 90 | Temp 97.6°F | Resp 16 | Ht 70.0 in | Wt 164.0 lb

## 2012-03-23 DIAGNOSIS — D473 Essential (hemorrhagic) thrombocythemia: Secondary | ICD-10-CM

## 2012-03-23 DIAGNOSIS — D509 Iron deficiency anemia, unspecified: Secondary | ICD-10-CM

## 2012-03-23 LAB — CBC WITH DIFFERENTIAL (CANCER CENTER ONLY)
BASO#: 0.1 10*3/uL (ref 0.0–0.2)
BASO%: 0.8 % (ref 0.0–2.0)
Eosinophils Absolute: 0.1 10*3/uL (ref 0.0–0.5)
HCT: 33.5 % — ABNORMAL LOW (ref 34.8–46.6)
HGB: 10.8 g/dL — ABNORMAL LOW (ref 11.6–15.9)
LYMPH#: 1.4 10*3/uL (ref 0.9–3.3)
LYMPH%: 21.8 % (ref 14.0–48.0)
MCV: 79 fL — ABNORMAL LOW (ref 81–101)
MONO#: 0.7 10*3/uL (ref 0.1–0.9)
NEUT%: 64.6 % (ref 39.6–80.0)
RDW: 16.8 % — ABNORMAL HIGH (ref 11.1–15.7)
WBC: 6.5 10*3/uL (ref 3.9–10.0)

## 2012-03-23 NOTE — Progress Notes (Signed)
This office note has been dictated.

## 2012-03-24 ENCOUNTER — Telehealth: Payer: Self-pay | Admitting: Hematology & Oncology

## 2012-03-24 NOTE — Progress Notes (Signed)
CC:   Alexandra Mends. Panosh, MD  DIAGNOSIS:  Essential thrombocythemia-JAK2 negative.  CURRENT THERAPY: 1. Anagrelide 4 mg p.o. daily. 2. Aspirin 81 mg p.o. daily.  INTERIM HISTORY:  Alexandra [name] comes in for her followup.  We last saw her back in November.  She enjoyed the holidays.  She is still working without any difficulties.  There have been no problems with headache.  She has had no rashes. There is no bleeding.  She has had no pain in her hands or feet.  I do have her on some iron supplementation.  I did find that her iron studies were a little bit on the lower side.  We are trying to correct this with oral iron.  Back in November, her ferritin was 218 with an iron saturation of 30%.  She has had no change in bowel or bladder habits.  She has had no cough or shortness breath.  There have been no fevers, sweats, or chills.  PHYSICAL EXAMINATION:  General:  This is a well-developed, well- nourished black female in no obvious distress.  Vital signs: Temperature of 97.6, pulse 90, respiratory rate 16, blood pressure 126/71.  Weight is 164.  Head and neck:  Normocephalic, atraumatic skull.  There are no ocular or oral lesions.  There are no palpable cervical or supraclavicular lymph nodes.  Lungs:  Clear bilaterally. Cardiac:  Regular rate and rhythm with a normal S1 and S2.  There are no murmurs, rubs, or bruits.  Abdomen:  Soft with good bowel sounds.  There is no palpable abdominal mass.  There is no palpable hepatosplenomegaly. Back:  No tenderness over the spine, ribs, or hips.  Extremities:  No clubbing, cyanosis, or edema.  LABORATORY STUDIES:  White cell count is 6.5, hemoglobin 10.8, hematocrit 33.5, platelet count 704.  MCV is 79.  IMPRESSION:  Alexandra [name] is a 62 year old African American female with essential thrombocythemia.  She has done very, very well.  Her platelet counts still are elevated, but are holding steady.  She is on a good dose of anagrelide that I think  we cannot change right now.  She is taking the aspirin and is very diligent in doing this.  We will plan to have Alexandra [name] come back monthly for her CBC.  I will plan to see her back myself in about 3 months.    ______________________________ Josph Macho, M.D. PRE/MEDQ  D:  03/23/2012  T:  03/24/2012  Job:  [id number]

## 2012-03-24 NOTE — Telephone Encounter (Signed)
Left message for pt to call and schedule appointments.

## 2012-04-06 ENCOUNTER — Other Ambulatory Visit (HOSPITAL_BASED_OUTPATIENT_CLINIC_OR_DEPARTMENT_OTHER): Payer: BC Managed Care – PPO | Admitting: Lab

## 2012-04-06 DIAGNOSIS — D473 Essential (hemorrhagic) thrombocythemia: Secondary | ICD-10-CM

## 2012-04-06 LAB — CBC WITH DIFFERENTIAL/PLATELET
BASO%: 1.1 % (ref 0.0–2.0)
Eosinophils Absolute: 0.1 10*3/uL (ref 0.0–0.5)
LYMPH%: 24 % (ref 14.0–49.7)
MCHC: 32.6 g/dL (ref 31.5–36.0)
MONO#: 0.6 10*3/uL (ref 0.1–0.9)
NEUT#: 4.1 10*3/uL (ref 1.5–6.5)
RBC: 4.27 10*6/uL (ref 3.70–5.45)
RDW: 17.4 % — ABNORMAL HIGH (ref 11.2–14.5)
WBC: 6.5 10*3/uL (ref 3.9–10.3)
lymph#: 1.6 10*3/uL (ref 0.9–3.3)

## 2012-04-19 ENCOUNTER — Other Ambulatory Visit (INDEPENDENT_AMBULATORY_CARE_PROVIDER_SITE_OTHER): Payer: BC Managed Care – PPO

## 2012-04-19 DIAGNOSIS — Z0000 Encounter for general adult medical examination without abnormal findings: Secondary | ICD-10-CM

## 2012-04-19 LAB — BASIC METABOLIC PANEL
CO2: 27 mEq/L (ref 19–32)
Calcium: 8.8 mg/dL (ref 8.4–10.5)
Chloride: 108 mEq/L (ref 96–112)
Glucose, Bld: 95 mg/dL (ref 70–99)
Potassium: 3.8 mEq/L (ref 3.5–5.1)
Sodium: 141 mEq/L (ref 135–145)

## 2012-04-19 LAB — HEMOGLOBIN A1C: Hgb A1c MFr Bld: 6.9 % — ABNORMAL HIGH (ref 4.6–6.5)

## 2012-04-19 LAB — LIPID PANEL
HDL: 60.6 mg/dL (ref >39.00)
VLDL: 12 mg/dL (ref 0.0–40.0)

## 2012-04-19 LAB — CBC WITH DIFFERENTIAL/PLATELET
Basophils Relative: 0.8 % (ref 0.0–3.0)
Eosinophils Absolute: 0.1 10*3/uL (ref 0.0–0.7)
HCT: 34.7 % — ABNORMAL LOW (ref 36.0–46.0)
Hemoglobin: 11.2 g/dL — ABNORMAL LOW (ref 12.0–15.0)
Lymphocytes Relative: 23.9 % (ref 12.0–46.0)
Lymphs Abs: 1.6 10*3/uL (ref 0.7–4.0)
MCHC: 32.2 g/dL (ref 30.0–36.0)
Neutro Abs: 4.4 10*3/uL (ref 1.4–7.7)
RBC: 4.41 Mil/uL (ref 3.87–5.11)

## 2012-04-19 LAB — LDL CHOLESTEROL, DIRECT: Direct LDL: 127.3 mg/dL

## 2012-04-19 LAB — HEPATIC FUNCTION PANEL
AST: 17 U/L (ref 0–37)
Bilirubin, Direct: 0 mg/dL (ref 0.0–0.3)
Total Bilirubin: 0.7 mg/dL (ref 0.3–1.2)

## 2012-04-19 LAB — TSH: TSH: 0.84 u[IU]/mL (ref 0.35–5.50)

## 2012-04-26 ENCOUNTER — Encounter: Payer: BC Managed Care – PPO | Admitting: Internal Medicine

## 2012-05-04 ENCOUNTER — Encounter: Payer: Self-pay | Admitting: Internal Medicine

## 2012-05-04 ENCOUNTER — Telehealth: Payer: Self-pay | Admitting: Internal Medicine

## 2012-05-04 ENCOUNTER — Other Ambulatory Visit (HOSPITAL_BASED_OUTPATIENT_CLINIC_OR_DEPARTMENT_OTHER): Payer: BC Managed Care – PPO | Admitting: Lab

## 2012-05-04 ENCOUNTER — Ambulatory Visit: Payer: BC Managed Care – PPO | Admitting: Internal Medicine

## 2012-05-04 DIAGNOSIS — D473 Essential (hemorrhagic) thrombocythemia: Secondary | ICD-10-CM

## 2012-05-04 LAB — CBC WITH DIFFERENTIAL/PLATELET
Basophils Absolute: 0.1 10*3/uL (ref 0.0–0.1)
EOS%: 1.6 % (ref 0.0–7.0)
Eosinophils Absolute: 0.1 10*3/uL (ref 0.0–0.5)
HGB: 10.9 g/dL — ABNORMAL LOW (ref 11.6–15.9)
MCH: 25.4 pg (ref 25.1–34.0)
NEUT#: 4.3 10*3/uL (ref 1.5–6.5)
RBC: 4.29 10*6/uL (ref 3.70–5.45)
RDW: 17.5 % — ABNORMAL HIGH (ref 11.2–14.5)
lymph#: 1.5 10*3/uL (ref 0.9–3.3)

## 2012-05-04 NOTE — Telephone Encounter (Signed)
This is the patient who was late and had to r/s. I sched her for 3/26. She wants to know, instead of the cpx, can [facility] just call her with her labs results? Please advise.

## 2012-05-04 NOTE — Progress Notes (Signed)
Record  Review  Pt was over 30 min lat for appt so resceduled

## 2012-05-05 NOTE — Telephone Encounter (Signed)
Can tell her results but i still want her to come in  To discuss results  and we need to update  And address her  Wellness parameters  And advice.    hg a1c is  Slightly up but your blood sugar is normal now.   Before the next visit would like  Another blood test called fructosamine . Dx hyperglycemia  ( ? If can be done with her hematology tests) .

## 2012-05-06 ENCOUNTER — Encounter: Payer: BC Managed Care – PPO | Admitting: Internal Medicine

## 2012-05-09 ENCOUNTER — Other Ambulatory Visit: Payer: Self-pay | Admitting: Family Medicine

## 2012-05-09 DIAGNOSIS — R739 Hyperglycemia, unspecified: Secondary | ICD-10-CM

## 2012-05-09 NOTE — Telephone Encounter (Signed)
Left a message on personally identified cell.  Gave the pt the results.  Instructed her to call back if she has any questions.  Also, instructed her to call and make physical appt and lab appt.  Order placed in the system.  Will mail the pt a copy of her results.

## 2012-05-13 ENCOUNTER — Telehealth: Payer: Self-pay | Admitting: *Deleted

## 2012-05-13 NOTE — Telephone Encounter (Signed)
Called patient to let her know that her platelets were stable per dr. Myna Hidalgo

## 2012-05-13 NOTE — Telephone Encounter (Signed)
Message copied by Anselm Jungling on Fri May 13, 2012  1:08 PM ------      Message from: Arlan Organ R      Created: Thu May 12, 2012  4:52 PM       Call - platelets are stable!!  Cindee Lame ------

## 2012-05-24 ENCOUNTER — Telehealth: Payer: Self-pay | Admitting: Internal Medicine

## 2012-05-24 NOTE — Telephone Encounter (Signed)
Please call patient   i think she means hg a1c . Avoiding siple carbs and sugars  .   Also  i want her to get the fructosamine  Level.

## 2012-05-24 NOTE — Telephone Encounter (Signed)
Pt's lab revealed high hemoglobin. Pt would like to know if there is anything she can do in the meantime to help this?

## 2012-05-25 ENCOUNTER — Encounter: Payer: BC Managed Care – PPO | Admitting: Internal Medicine

## 2012-05-26 NOTE — Telephone Encounter (Signed)
I spoke to the pt.  She would like to hold off on the lab work until she sees you in June unless you find it to be absolutely essential that she have this test.  Please advise if she should come in and get it now or if she can wait.  Thanks!!

## 2012-05-26 NOTE — Telephone Encounter (Signed)
She of course can wait  . If she wants we can refer to dietician for  Counseling or just wait and see how we do.

## 2012-06-01 ENCOUNTER — Other Ambulatory Visit (HOSPITAL_BASED_OUTPATIENT_CLINIC_OR_DEPARTMENT_OTHER): Payer: BC Managed Care – PPO | Admitting: Lab

## 2012-06-01 ENCOUNTER — Ambulatory Visit (HOSPITAL_BASED_OUTPATIENT_CLINIC_OR_DEPARTMENT_OTHER): Payer: BC Managed Care – PPO | Admitting: Hematology & Oncology

## 2012-06-01 VITALS — BP 149/69 | HR 100 | Temp 98.0°F | Resp 16 | Ht 70.0 in | Wt 163.0 lb

## 2012-06-01 DIAGNOSIS — D649 Anemia, unspecified: Secondary | ICD-10-CM

## 2012-06-01 DIAGNOSIS — D473 Essential (hemorrhagic) thrombocythemia: Secondary | ICD-10-CM

## 2012-06-01 DIAGNOSIS — R718 Other abnormality of red blood cells: Secondary | ICD-10-CM

## 2012-06-01 DIAGNOSIS — D509 Iron deficiency anemia, unspecified: Secondary | ICD-10-CM

## 2012-06-01 LAB — CBC WITH DIFFERENTIAL (CANCER CENTER ONLY)
BASO%: 0.6 % (ref 0.0–2.0)
EOS%: 1.5 % (ref 0.0–7.0)
HGB: 10.3 g/dL — ABNORMAL LOW (ref 11.6–15.9)
LYMPH%: 18.8 % (ref 14.0–48.0)
MCH: 25.8 pg — ABNORMAL LOW (ref 26.0–34.0)
NEUT#: 4.6 10*3/uL (ref 1.5–6.5)
NEUT%: 68.7 % (ref 39.6–80.0)
Platelets: 878 10*3/uL — ABNORMAL HIGH (ref 145–400)
RBC: 4 10*6/uL (ref 3.70–5.32)
WBC: 6.6 10*3/uL (ref 3.9–10.0)

## 2012-06-01 NOTE — Progress Notes (Signed)
This office note has been dictated.

## 2012-06-02 NOTE — Progress Notes (Signed)
CC:   Neta Mends. Panosh, MD  DIAGNOSIS:  Essential thrombocythemia-JAK2 negative.  CURRENT THERAPY: 1. Anagrelide 4 mg p.o. daily. 2. Aspirin 81 mg p.o. daily.  INTERIM HISTORY:  Alexandra [name] comes in for her followup.  She is feeling well.  She is really not having any problems with the anagrelide.  She says that it has been easier for her than the Hydrea.  She has not had any obvious bleeding or bruising.  There has been no pain in her hands or feet.  She has had occasional headaches.  There has been no dysphagia or odynophagia.  The last time we checked her iron studies was back in November.  I think that we are rechecking them today.  There were okay back in November.  She is working without any difficulties.  She got through all the wintertime without any problems or without any power outages.  PHYSICAL EXAMINATION:  General:  This is a well-developed, well- nourished [place] American female who is in no obvious distress.  Vital signs:  Temperature of 98.8, pulse 108, respiratory rate 16, blood pressure 149/69.  Weight is 163.  Head and neck:  Normocephalic, atraumatic skull.  There are no ocular or oral lesions.  There are no palpable cervical or supraclavicular lymph nodes.  Lungs:  Clear to percussion and auscultation bilaterally.  Cardiac:  Regular rate and rhythm with a normal S1 and S2.  There are no murmurs, rubs, or bruits. Abdomen:  Soft with good bowel sounds.  There is no palpable abdominal mass.  There is no fluid wave.  There is no palpable hepatosplenomegaly. Extremities:  No clubbing, cyanosis, or edema.  She has good range of motion of her joints.  Skin:  No erythema on the palms of her hands. Neurological:  No focal neurological deficits.  LABORATORY STUDIES:  White cell count is 6.6, hemoglobin 10.3, hematocrit 31.7, platelet count 878.  MCV is 79.  Peripheral smear shows some mild anisocytosis.  There are some microcytic red cells.  I see no nucleated red  blood cells.  There are no target cells.  There are no schistocyte or spherocytes.  White cells appear normal in morphology and maturation.  There are no immature myeloid or lymphoid forms.  There are no blasts.  Platelets are markedly increased in number.  She has numerous large platelets.  IMPRESSION:  [name] is a 62 year old African American female with essential thrombocythemia.  This has been somewhat resilient from my point of view.  She is on a good dose of anagrelide and yet, her platelet count still has not been trending downward.  She appears to be maintaining a range.  I have noticed that her anemia is progressing.  I am rechecking her iron studies.  I want to see her back in 1 month.  At some point, I think another bone marrow test might be necessary for her.  I probably have not done a bone marrow test on her for, I think, 4 or 5 years.    ______________________________ Josph Macho, M.D. PRE/MEDQ  D:  06/01/2012  T:  06/02/2012  Job:  [id number]

## 2012-06-15 ENCOUNTER — Telehealth: Payer: Self-pay | Admitting: Internal Medicine

## 2012-06-15 NOTE — Telephone Encounter (Signed)
Do you want to give samples? 

## 2012-06-15 NOTE — Telephone Encounter (Signed)
okl to give her one month if we have any for this time.  When she comes in for next OV  we could consider  A similar medication  Such as carvedilol  but would have to take it twice a day

## 2012-06-15 NOTE — Telephone Encounter (Signed)
Pt called to request samples of Nebivolol HCl (BYSTOLIC) 20 MG TABS. She stated that she does not get paid until the end of the month, and this is an extremely costly medication. Please assist.

## 2012-06-16 NOTE — Telephone Encounter (Signed)
We do not have any samples of Bystolic in the office.  Is there something else I could give her since she does not have the money for a rx?

## 2012-06-16 NOTE — Telephone Encounter (Signed)
Left message on cell for the pt to return my call. 

## 2012-06-16 NOTE — Telephone Encounter (Signed)
Would need OV to decide on this  If doesn't have over weekend  We can try generic coreg 40 mg ER once a day and then ROV in the next 2 weeks

## 2012-06-16 NOTE — Telephone Encounter (Signed)
I spoke to the pt.  She informed me that she will do some "research" and will call me back.  She did inform me that she is completely out of her Bystolic medication.  She refused for the Coreg to be called to the pharmacy.  I did inform her that the office is closed tomorrow for Good Friday.

## 2012-06-30 ENCOUNTER — Ambulatory Visit (HOSPITAL_BASED_OUTPATIENT_CLINIC_OR_DEPARTMENT_OTHER): Payer: BC Managed Care – PPO | Admitting: Hematology & Oncology

## 2012-06-30 ENCOUNTER — Other Ambulatory Visit (HOSPITAL_BASED_OUTPATIENT_CLINIC_OR_DEPARTMENT_OTHER): Payer: BC Managed Care – PPO | Admitting: Lab

## 2012-06-30 VITALS — BP 148/82 | HR 78 | Temp 97.8°F | Resp 16 | Ht 70.0 in | Wt 162.0 lb

## 2012-06-30 DIAGNOSIS — D473 Essential (hemorrhagic) thrombocythemia: Secondary | ICD-10-CM

## 2012-06-30 DIAGNOSIS — D509 Iron deficiency anemia, unspecified: Secondary | ICD-10-CM

## 2012-06-30 LAB — CBC WITH DIFFERENTIAL (CANCER CENTER ONLY)
BASO#: 0.1 10*3/uL (ref 0.0–0.2)
BASO%: 0.8 % (ref 0.0–2.0)
HCT: 36 % (ref 34.8–46.6)
HGB: 11.5 g/dL — ABNORMAL LOW (ref 11.6–15.9)
LYMPH#: 1.5 10*3/uL (ref 0.9–3.3)
MONO#: 0.6 10*3/uL (ref 0.1–0.9)
NEUT#: 4.3 10*3/uL (ref 1.5–6.5)
NEUT%: 65 % (ref 39.6–80.0)
RDW: 17.5 % — ABNORMAL HIGH (ref 11.1–15.7)
WBC: 6.6 10*3/uL (ref 3.9–10.0)

## 2012-06-30 LAB — IRON AND TIBC
Iron: 96 ug/dL (ref 42–145)
TIBC: 278 ug/dL (ref 250–470)
UIBC: 182 ug/dL (ref 125–400)

## 2012-06-30 LAB — RETICULOCYTES (CHCC): Retic Ct Pct: 0.9 % (ref 0.4–2.3)

## 2012-06-30 NOTE — Progress Notes (Signed)
This office note has been dictated.

## 2012-07-01 NOTE — Progress Notes (Signed)
CC:   Alexandra Mends. Panosh, MD  DIAGNOSIS:  Alexandra [name].  CURRENT THERAPY: 1. Anagrelide 4 mg p.o. daily. 2. Aspirin 81 mg p.o. daily.  INTERIM HISTORY:  Alexandra [name] comes in for her followup.  She is doing pretty well.  She has had no issues since we last saw her.  We are checking her blood counts every 4 weeks.  Her platelet count has tended to fluctuate.  She has had no pain in her hands or feet.  She has had no nausea or vomiting.  She has had no cough or shortness of breath.  There has been no leg swelling.  She has had no change in vision.  Her appetite has been good.  She has had no dysphagia or odynophagia. There has been no bleeding.  PHYSICAL EXAMINATION:  General:  This is a well-developed, well- nourished black female in no obvious distress.  Vital signs: Temperature of 97.8, pulse 78, respiratory rate 16, blood pressure 148/82.  Weight is 162.  Head and neck:  Normocephalic, atraumatic skull.  There are no ocular or oral lesions.  There are no palpable cervical or supraclavicular lymph nodes.  Lungs:  Clear bilaterally. Cardiac:  Regular rate and rhythm with a normal S1 and S2.  There are no murmurs, rubs, or bruits.  Abdomen:  Soft with good bowel sounds.  There is no palpable abdominal mass.  There is no palpable hepatosplenomegaly. Extremities:  No clubbing, cyanosis, or edema.  Neurological:  No focal neurological deficits.  Skin:  No rashes, ecchymosis, or petechia.  LABORATORY STUDIES:  White cell count is 6.6, hemoglobin 11.5, hematocrit 36, platelet count 811.  MCV is 81.  Iron saturation is 35%.  IMPRESSION:  Alexandra [name] is a 62 year old African American female with thrombocythemia.  Again, she is JAK2 [name].  Her platelet count is holding relatively stable.  She is asymptomatic. She feels well.  Her hemoglobin is better, so I am happy about that.  I forgot to mention that she does take oral iron daily.  We will continue to  follow her along.  I do not think we have to make any adjustments with the anagrelide dose for right now.  She has been asymptomatic.  She is doing well with the aspirin.  I will continue to have her blood counts checked monthly.  I will see her back in 2 months.    ______________________________ Josph Macho, M.D. PRE/MEDQ  D:  06/30/2012  T:  07/01/2012  Job:  [id number]

## 2012-07-05 ENCOUNTER — Telehealth: Payer: Self-pay | Admitting: *Deleted

## 2012-07-05 NOTE — Telephone Encounter (Addendum)
Message copied by Mirian Capuchin on Tue Jul 05, 2012  2:34 PM ------      Message from: Josph Macho      Created: Fri Jul 01, 2012  6:44 PM       Call - iron is ok!!  Cindee Lame ------This message given to patient.  She voiced understanding.

## 2012-07-27 ENCOUNTER — Other Ambulatory Visit (HOSPITAL_BASED_OUTPATIENT_CLINIC_OR_DEPARTMENT_OTHER): Payer: BC Managed Care – PPO | Admitting: Lab

## 2012-07-27 DIAGNOSIS — D473 Essential (hemorrhagic) thrombocythemia: Secondary | ICD-10-CM

## 2012-07-27 LAB — CBC WITH DIFFERENTIAL/PLATELET
Basophils Absolute: 0.1 10*3/uL (ref 0.0–0.1)
Eosinophils Absolute: 0.1 10*3/uL (ref 0.0–0.5)
HCT: 32.3 % — ABNORMAL LOW (ref 34.8–46.6)
LYMPH%: 22.8 % (ref 14.0–49.7)
MCV: 77.3 fL — ABNORMAL LOW (ref 79.5–101.0)
MONO#: 0.9 10*3/uL (ref 0.1–0.9)
MONO%: 10.6 % (ref 0.0–14.0)
NEUT#: 5.5 10*3/uL (ref 1.5–6.5)
NEUT%: 64.6 % (ref 38.4–76.8)
Platelets: 792 10*3/uL — ABNORMAL HIGH (ref 145–400)
RBC: 4.18 10*6/uL (ref 3.70–5.45)

## 2012-08-02 ENCOUNTER — Encounter: Payer: BC Managed Care – PPO | Admitting: Internal Medicine

## 2012-08-17 ENCOUNTER — Other Ambulatory Visit: Payer: Self-pay | Admitting: *Deleted

## 2012-08-17 DIAGNOSIS — D473 Essential (hemorrhagic) thrombocythemia: Secondary | ICD-10-CM

## 2012-08-17 MED ORDER — ANAGRELIDE HCL 1 MG PO CAPS: 4.0000 mg | ORAL_CAPSULE | Freq: Every day | ORAL | Status: DC

## 2012-08-17 NOTE — Telephone Encounter (Signed)
Received refill request from [facility] and the pt for Anagrelide 1 mg (4 tabs) daily. New rx sent via epresribe to her pharmacy.  Of Note:  [facility] told her a refill request was sent on Sunday and again today. 1st request received was on 08/16/12 at 1749 and 2nd one at 08/17/12 00:34.

## 2012-08-24 ENCOUNTER — Other Ambulatory Visit (HOSPITAL_COMMUNITY)
Admission: RE | Admit: 2012-08-24 | Discharge: 2012-08-24 | Disposition: A | Discharge status: Home / Self Care | Payer: BC Managed Care – PPO | Source: Ambulatory Visit | Attending: Internal Medicine | Admitting: Internal Medicine

## 2012-08-24 ENCOUNTER — Ambulatory Visit (INDEPENDENT_AMBULATORY_CARE_PROVIDER_SITE_OTHER): Payer: BC Managed Care – PPO | Admitting: Internal Medicine

## 2012-08-24 ENCOUNTER — Encounter: Payer: Self-pay | Admitting: Internal Medicine

## 2012-08-24 ENCOUNTER — Encounter: Payer: BC Managed Care – PPO | Admitting: Internal Medicine

## 2012-08-24 VITALS — BP 134/82 | HR 86 | Temp 97.8°F | Wt 159.0 lb

## 2012-08-24 DIAGNOSIS — I10 Essential (primary) hypertension: Secondary | ICD-10-CM

## 2012-08-24 DIAGNOSIS — D473 Essential (hemorrhagic) thrombocythemia: Secondary | ICD-10-CM

## 2012-08-24 DIAGNOSIS — Z0000 Encounter for general adult medical examination without abnormal findings: Secondary | ICD-10-CM

## 2012-08-24 DIAGNOSIS — Z01419 Encounter for gynecological examination (general) (routine) without abnormal findings: Secondary | ICD-10-CM | POA: Insufficient documentation

## 2012-08-24 DIAGNOSIS — Z1151 Encounter for screening for human papillomavirus (HPV): Secondary | ICD-10-CM | POA: Insufficient documentation

## 2012-08-24 DIAGNOSIS — R7309 Other abnormal glucose: Secondary | ICD-10-CM

## 2012-08-24 LAB — HEMOGLOBIN A1C: Hgb A1c MFr Bld: 7.1 % — ABNORMAL HIGH (ref 4.6–6.5)

## 2012-08-24 NOTE — Progress Notes (Signed)
Chief Complaint  Patient presents with  . Annual Exam    HPI: Patient comes in today for Preventive Health Care visit  No major change in health status since last visit . On meds for her thrombocytosis followed about every 3 months. Trying to maintain a healthy lifestyle to control her slightly elevated blood sugars. Losing weight on purpose  Cause   Feels healthy .exercising  walkking . No cardiovascular physical limitations. Bp up and down. Gain on same medication sometimes blood pressure readings go up in the office.  ROS:  GEN/ HEENT: No fever, significant weight changes sweats headaches vision problems hearing changes, CV/ PULM; No chest pain shortness of breath cough, syncope,edema  change in exercise tolerance. GI /GU: No adominal pain, vomiting, change in bowel habits. No blood in the stool. No significant GU symptoms. SKIN/HEME: ,no acute skin rashes suspicious lesions or bleeding. No lymphadenopathy, nodules, masses.  NEURO/ PSYCH:  No neurologic signs such as weakness numbness. No depression anxiety. IMM/ Allergy: No unusual infections.  Allergy .   REST of 12 system review negative except as per HPI   Past Medical History  Diagnosis Date  . Hypertension     echo   . Thrombocytosis     Family History  Problem Relation Age of Onset  . Heart disease Mother     died age 56  . Heart attack Father 23    massive  . Hypertension Sister   . Hypertension Sister   . Hypertension Sister   . Hypertension Sister   . Arthritis      family hx  . Diabetes      family hx  . Kidney disease Mother     family hx  . Stroke       dialysis    History   Social History  . Marital Status: Married    Spouse Name: N/A    Number of Children: N/A  . Years of Education: N/A   Social History Main Topics  . Smoking status: Never Smoker   . Smokeless tobacco: None  . Alcohol Use: No  . Drug Use: No  . Sexually Active: None   Other Topics Concern  . 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   No tobacco no ethoh little caffiene .     Outpatient Encounter Prescriptions as of 08/24/2012  Medication Sig Dispense Refill  . amLODipine (NORVASC) 5 MG tablet Take 1 tablet (5 mg total) by mouth daily.  90 tablet  3  . anagrelide (AGRYLIN) 1 MG capsule Take 4 capsules (4 mg total) by mouth daily. Pt takes 4 tabs daily  120 capsule  6  . Fe Fum-FePoly-Vit C-Vit B3 (INTEGRA) 62.5-62.5-40-3 MG CAPS Take 1 capsule by mouth daily.      Marland Kitchen lisinopril (PRINIVIL,ZESTRIL) 20 MG tablet Take 2 tablets (40 mg total) by mouth daily.  180 tablet  3  . Multiple Vitamin (MULTI-VITAMIN DAILY PO) Take by mouth every morning.      . Nebivolol HCl (BYSTOLIC) 20 MG TABS Take 1 tablet (20 mg total) by mouth every morning.  30 tablet  11   No facility-administered encounter medications on file as of 08/24/2012.    EXAM:  BP 134/82  Pulse 86  Temp(Src) 97.8 F (36.6 C) (Oral)  Wt 159 lb (72.122 kg)  BMI 22.81 kg/m2  SpO2 98%  Body mass index is 22.81 kg/(m^2).  Physical Exam: Vital signs reviewed XBJ:YNWG is a well-developed well-nourished alert cooperative   female who appears her stated age in no acute distress.  HEENT: normocephalic atraumatic , Eyes: PERRL EOM's full, conjunctiva clear, Nares: paten,t no deformity discharge or tenderness., Ears: no deformity EAC's clear TMs with normal landmarks. Mouth: clear OP, no lesions, edema.  Moist mucous membranes. Dentition in adequate repair. NECK: supple without masses, thyromegaly or bruits. CHEST/PULM:  Clear to auscultation and percussion breath sounds equal no wheeze , rales or rhonchi. No chest wall deformities or tenderness. CV: PMI is nondisplaced, S1 S2 no gallops, murmurs, rubs. Peripheral pulses are full without delay.No JVD .  Breast: normal by inspection . No dimpling, discharge, masses, tenderness or discharge . ABDOMEN: Bowel sounds normal nontender   No guard or rebound, no hepato splenomegal no CVA tenderness.  No hernia. Extremtities:  No clubbing cyanosis or edema, no acute joint swelling or redness no focal atrophy NEURO:  Oriented x3, cranial nerves 3-12 appear to be intact, no obvious focal weakness,gait within normal limits no abnormal reflexes or asymmetrical SKIN: No acute rashes normal turgor, color, no bruising or petechiae. PSYCH: Oriented, good eye contact, no obvious depression anxiety, cognition and judgment appear normal. LN: no cervical axillary inguinal adenopathy Pelvic: NL ext GU, labia clear without lesions or rash . Vagina no lesions .Cervix: clear  UTERUS: Neg CMT Adnexa:  clear no masses . PAP done Rectal no masses  Heme neg   Lab Results  Component Value Date   WBC 8.5 07/27/2012   HGB 10.7* 07/27/2012   HCT 32.3* 07/27/2012   PLT 792* 07/27/2012   GLUCOSE 95 04/19/2012   CHOL 208* 04/19/2012   TRIG 60.0 04/19/2012   HDL 60.60 04/19/2012   LDLDIRECT 127.3 04/19/2012   LDLCALC 130* 02/18/2011   ALT 18 04/19/2012   AST 17 04/19/2012   NA 141 04/19/2012   K 3.8 04/19/2012   CL 108 04/19/2012   CREATININE 0.7 04/19/2012   BUN 18 04/19/2012   CO2 27 04/19/2012   TSH 0.84 04/19/2012   HGBA1C 7.1* 08/24/2012    ASSESSMENT AND PLAN:  Discussed the following assessment and plan:  Visit for preventive health examination - Plan: PAP [[place]]  Routine gynecological examination - nl exam pap donewith hpv - Plan: PAP [[place]]  HYPERGLYCEMIA - recheck a1c lsi to continue   result after pt left .  a1c today is up to 7.1   may need to add med but consider  endo consult.( never got fructosamine) - Plan: Hemoglobin A1c  HYPERTENSION - haad been hard to control better readings on repeat today  THROMBOCYTHEMIA Counseled regarding healthy nutrition, exercise, sleep, injury prevention, calcium vit d and healthy weight . Check on cost of zostavax and can return for injection Patient Care Team: Madelin Headings, MD as PCP -  General Josph Macho, MD as Attending Physician (Hematology and Oncology) Patient Instructions   Will notify you  of labs when available. Continue lifestyle intervention healthy eating and exercise . You bp is better . Wt Readings from Last 3 Encounters:  08/24/12 159 lb (72.122 kg)  06/30/12 162 lb (73.483 kg)  06/01/12 163 lb (73.936 kg)    Consider shingles vaccine at any time .Yearly wellness visit unless  Other indicated    Preventive Care for Adults, Female A healthy lifestyle and preventive care can promote health and wellness. Preventive health guidelines for women include  the following key practices.  A routine yearly physical is a good way to check with your caregiver about your health and preventive screening. It is a chance to share any concerns and updates on your health, and to receive a thorough exam.  Visit your dentist for a routine exam and preventive care every 6 months. Brush your teeth twice a day and floss once a day. Good oral hygiene prevents tooth decay and gum disease.  The frequency of eye exams is based on your age, health, family medical history, use of contact lenses, and other factors. Follow your caregiver's recommendations for frequency of eye exams.  Eat a healthy diet. Foods like vegetables, fruits, whole grains, low-fat dairy products, and lean protein foods contain the nutrients you need without too many calories. Decrease your intake of foods high in solid fats, added sugars, and salt. Eat the right amount of calories for you.Get information about a proper diet from your caregiver, if necessary.  Regular physical exercise is one of the most important things you can do for your health. Most adults should get at least 150 minutes of moderate-intensity exercise (any activity that increases your heart rate and causes you to sweat) each week. In addition, most adults need muscle-strengthening exercises on 2 or more days a week.  Maintain a healthy  weight. The body mass index (BMI) is a screening tool to identify possible weight problems. It provides an estimate of body fat based on height and weight. Your caregiver can help determine your BMI, and can help you achieve or maintain a healthy weight.For adults 20 years and older:  A BMI below 18.5 is considered underweight.  A BMI of 18.5 to 24.9 is normal.  A BMI of 25 to 29.9 is considered overweight.  A BMI of 30 and above is considered obese.  Maintain normal blood lipids and cholesterol levels by exercising and minimizing your intake of saturated fat. Eat a balanced diet with plenty of fruit and vegetables. Blood tests for lipids and cholesterol should begin at age 80 and be repeated every 5 years. If your lipid or cholesterol levels are high, you are over 50, or you are at high risk for heart disease, you may need your cholesterol levels checked more frequently.Ongoing high lipid and cholesterol levels should be treated with medicines if diet and exercise are not effective.  If you smoke, find out from your caregiver how to quit. If you do not use tobacco, do not start.  If you are pregnant, do not drink alcohol. If you are breastfeeding, be very cautious about drinking alcohol. If you are not pregnant and choose to drink alcohol, do not exceed 1 drink per day. One drink is considered to be 12 ounces (355 mL) of beer, 5 ounces (148 mL) of wine, or 1.5 ounces (44 mL) of liquor.  Avoid use of street drugs. Do not share needles with anyone. Ask for help if you need support or instructions about stopping the use of drugs.  High blood pressure causes heart disease and increases the risk of stroke. Your blood pressure should be checked at least every 1 to 2 years. Ongoing high blood pressure should be treated with medicines if weight loss and exercise are not effective.  If you are 18 to 62 years old, ask your caregiver if you should take aspirin to prevent strokes.  Diabetes screening  involves taking a blood sample to check your fasting blood sugar level. This should be done once every 3 years,  after age 29, if you are within normal weight and without risk factors for diabetes. Testing should be considered at a younger age or be carried out more frequently if you are overweight and have at least 1 risk factor for diabetes.  Breast cancer screening is essential preventive care for women. You should practice "breast self-awareness." This means understanding the normal appearance and feel of your breasts and may include breast self-examination. Any changes detected, no matter how small, should be reported to a caregiver. Women in their 44s and 30s should have a clinical breast exam (CBE) by a caregiver as part of a regular health exam every 1 to 3 years. After age 30, women should have a CBE every year. Starting at age 53, women should consider having a mammography (breast X-ray test) every year. Women who have a family history of breast cancer should talk to their caregiver about genetic screening. Women at a high risk of breast cancer should talk to their caregivers about having magnetic resonance imaging (MRI) and a mammography every year.  The Pap test is a screening test for cervical cancer. A Pap test can show cell changes on the cervix that might become cervical cancer if left untreated. A Pap test is a procedure in which cells are obtained and examined from the lower end of the uterus (cervix).  Women should have a Pap test starting at age 41.  Between ages 24 and 8, Pap tests should be repeated every 2 years.  Beginning at age 71, you should have a Pap test every 3 years as long as the past 3 Pap tests have been normal.  Some women have medical problems that increase the chance of getting cervical cancer. Talk to your caregiver about these problems. It is especially important to talk to your caregiver if a new problem develops soon after your last Pap test. In these cases, your  caregiver may recommend more frequent screening and Pap tests.  The above recommendations are the same for women who have or have not gotten the vaccine for human papillomavirus (HPV).  If you had a hysterectomy for a problem that was not cancer or a condition that could lead to cancer, then you no longer need Pap tests. Even if you no longer need a Pap test, a regular exam is a good idea to make sure no other problems are starting.  If you are between ages 18 and 61, and you have had normal Pap tests going back 10 years, you no longer need Pap tests. Even if you no longer need a Pap test, a regular exam is a good idea to make sure no other problems are starting.  If you have had past treatment for cervical cancer or a condition that could lead to cancer, you need Pap tests and screening for cancer for at least 20 years after your treatment.  If Pap tests have been discontinued, risk factors (such as a new sexual partner) need to be reassessed to determine if screening should be resumed.  The HPV test is an additional test that may be used for cervical cancer screening. The HPV test looks for the virus that can cause the cell changes on the cervix. The cells collected during the Pap test can be tested for HPV. The HPV test could be used to screen women aged 48 years and older, and should be used in women of any age who have unclear Pap test results. After the age of 70, women should have HPV  testing at the same frequency as a Pap test.  Colorectal cancer can be detected and often prevented. Most routine colorectal cancer screening begins at the age of 36 and continues through age 36. However, your caregiver may recommend screening at an earlier age if you have risk factors for colon cancer. On a yearly basis, your caregiver may provide home test kits to check for hidden blood in the stool. Use of a small camera at the end of a tube, to directly examine the colon (sigmoidoscopy or colonoscopy), can  detect the earliest forms of colorectal cancer. Talk to your caregiver about this at age 63, when routine screening begins. Direct examination of the colon should be repeated every 5 to 10 years through age 85, unless early forms of pre-cancerous polyps or small growths are found.  Hepatitis C blood testing is recommended for all people born from [birth year] through 1965 and any individual with known risks for hepatitis C.  Practice safe sex. Use condoms and avoid high-risk sexual practices to reduce the spread of sexually transmitted infections (STIs). STIs include gonorrhea, chlamydia, syphilis, trichomonas, herpes, HPV, and human immunodeficiency virus (HIV). Herpes, HIV, and HPV are viral illnesses that have no cure. They can result in disability, cancer, and death. Sexually active women aged 55 and younger should be checked for chlamydia. Older women with new or multiple partners should also be tested for chlamydia. Testing for other STIs is recommended if you are sexually active and at increased risk.  Osteoporosis is a disease in which the bones lose minerals and strength with aging. This can result in serious bone fractures. The risk of osteoporosis can be identified using a bone density scan. Women ages 66 and over and women at risk for fractures or osteoporosis should discuss screening with their caregivers. Ask your caregiver whether you should take a calcium supplement or vitamin D to reduce the rate of osteoporosis.  Menopause can be associated with physical symptoms and risks. Hormone replacement therapy is available to decrease symptoms and risks. You should talk to your caregiver about whether hormone replacement therapy is right for you.  Use sunscreen with sun protection factor (SPF) of 30 or more. Apply sunscreen liberally and repeatedly throughout the day. You should seek shade when your shadow is shorter than you. Protect yourself by wearing long sleeves, pants, a wide-brimmed hat, and  sunglasses year round, whenever you are outdoors.  Once a month, do a whole body skin exam, using a mirror to look at the skin on your back. Notify your caregiver of new moles, moles that have irregular borders, moles that are larger than a pencil eraser, or moles that have changed in shape or color.  Stay current with required immunizations.  Influenza. You need a dose every fall (or winter). The composition of the flu vaccine changes each year, so being vaccinated once is not enough.  Pneumococcal polysaccharide. You need 1 to 2 doses if you smoke cigarettes or if you have certain chronic medical conditions. You need 1 dose at age 95 (or older) if you have never been vaccinated.  Tetanus, diphtheria, pertussis (Tdap, Td). Get 1 dose of Tdap vaccine if you are younger than age 28, are over 69 and have contact with an infant, are a Research scientist (physical sciences), are pregnant, or simply want to be protected from whooping cough. After that, you need a Td booster dose every 10 years. Consult your caregiver if you have not had at least 3 tetanus and diphtheria-containing shots sometime in  your life or have a deep or dirty wound.  HPV. You need this vaccine if you are a woman age 15 or younger. The vaccine is given in 3 doses over 6 months.  Measles, mumps, rubella (MMR). You need at least 1 dose of MMR if you were born in 1957 or later. You may also need a second dose.  Meningococcal. If you are age 51 to 25 and a first-year college student living in a residence hall, or have one of several medical conditions, you need to get vaccinated against meningococcal disease. You may also need additional booster doses.  Zoster (shingles). If you are age 15 or older, you should get this vaccine.  Varicella (chickenpox). If you have never had chickenpox or you were vaccinated but received only 1 dose, talk to your caregiver to find out if you need this vaccine.  Hepatitis A. You need this vaccine if you have a specific  risk factor for hepatitis A virus infection or you simply wish to be protected from this disease. The vaccine is usually given as 2 doses, 6 to 18 months apart.  Hepatitis B. You need this vaccine if you have a specific risk factor for hepatitis B virus infection or you simply wish to be protected from this disease. The vaccine is given in 3 doses, usually over 6 months. Preventive Services / Frequency Ages 84 to 2  Blood pressure check.** / Every 1 to 2 years.  Lipid and cholesterol check.** / Every 5 years beginning at age 72.  Clinical breast exam.** / Every 3 years for women in their 27s and 30s.  Pap test.** / Every 2 years from ages 74 through 9. Every 3 years starting at age 72 through age 56 or 84 with a history of 3 consecutive normal Pap tests.  HPV screening.** / Every 3 years from ages 35 through ages 53 to 21 with a history of 3 consecutive normal Pap tests.  Hepatitis C blood test.** / For any individual with known risks for hepatitis C.  Skin self-exam. / Monthly.  Influenza immunization.** / Every year.  Pneumococcal polysaccharide immunization.** / 1 to 2 doses if you smoke cigarettes or if you have certain chronic medical conditions.  Tetanus, diphtheria, pertussis (Tdap, Td) immunization. / A one-time dose of Tdap vaccine. After that, you need a Td booster dose every 10 years.  HPV immunization. / 3 doses over 6 months, if you are 47 and younger.  Measles, mumps, rubella (MMR) immunization. / You need at least 1 dose of MMR if you were born in 1957 or later. You may also need a second dose.  Meningococcal immunization. / 1 dose if you are age 41 to 42 and a first-year college student living in a residence hall, or have one of several medical conditions, you need to get vaccinated against meningococcal disease. You may also need additional booster doses.  Varicella immunization.** / Consult your caregiver.  Hepatitis A immunization.** / Consult your caregiver. 2  doses, 6 to 18 months apart.  Hepatitis B immunization.** / Consult your caregiver. 3 doses usually over 6 months. Ages 8 to 66  Blood pressure check.** / Every 1 to 2 years.  Lipid and cholesterol check.** / Every 5 years beginning at age 68.  Clinical breast exam.** / Every year after age 65.  Mammogram.** / Every year beginning at age 17 and continuing for as long as you are in good health. Consult with your caregiver.  Pap test.** / Every 3  years starting at age 45 through age 67 or 20 with a history of 3 consecutive normal Pap tests.  HPV screening.** / Every 3 years from ages 33 through ages 75 to 49 with a history of 3 consecutive normal Pap tests.  Fecal occult blood test (FOBT) of stool. / Every year beginning at age 37 and continuing until age 47. You may not need to do this test if you get a colonoscopy every 10 years.  Flexible sigmoidoscopy or colonoscopy.** / Every 5 years for a flexible sigmoidoscopy or every 10 years for a colonoscopy beginning at age 46 and continuing until age 36.  Hepatitis C blood test.** / For all people born from [birth year] through 1965 and any individual with known risks for hepatitis C.  Skin self-exam. / Monthly.  Influenza immunization.** / Every year.  Pneumococcal polysaccharide immunization.** / 1 to 2 doses if you smoke cigarettes or if you have certain chronic medical conditions.  Tetanus, diphtheria, pertussis (Tdap, Td) immunization.** / A one-time dose of Tdap vaccine. After that, you need a Td booster dose every 10 years.  Measles, mumps, rubella (MMR) immunization. / You need at least 1 dose of MMR if you were born in 1957 or later. You may also need a second dose.  Varicella immunization.** / Consult your caregiver.  Meningococcal immunization.** / Consult your caregiver.  Hepatitis A immunization.** / Consult your caregiver. 2 doses, 6 to 18 months apart.  Hepatitis B immunization.** / Consult your caregiver. 3 doses, usually  over 6 months. Ages 28 and over  Blood pressure check.** / Every 1 to 2 years.  Lipid and cholesterol check.** / Every 5 years beginning at age 84.  Clinical breast exam.** / Every year after age 59.  Mammogram.** / Every year beginning at age 81 and continuing for as long as you are in good health. Consult with your caregiver.  Pap test.** / Every 3 years starting at age 72 through age 74 or 34 with a 3 consecutive normal Pap tests. Testing can be stopped between 65 and 70 with 3 consecutive normal Pap tests and no abnormal Pap or HPV tests in the past 10 years.  HPV screening.** / Every 3 years from ages 25 through ages 24 or 24 with a history of 3 consecutive normal Pap tests. Testing can be stopped between 65 and 70 with 3 consecutive normal Pap tests and no abnormal Pap or HPV tests in the past 10 years.  Fecal occult blood test (FOBT) of stool. / Every year beginning at age 85 and continuing until age 74. You may not need to do this test if you get a colonoscopy every 10 years.  Flexible sigmoidoscopy or colonoscopy.** / Every 5 years for a flexible sigmoidoscopy or every 10 years for a colonoscopy beginning at age 31 and continuing until age 44.  Hepatitis C blood test.** / For all people born from [birth year] through 1965 and any individual with known risks for hepatitis C.  Osteoporosis screening.** / A one-time screening for women ages 18 and over and women at risk for fractures or osteoporosis.  Skin self-exam. / Monthly.  Influenza immunization.** / Every year.  Pneumococcal polysaccharide immunization.** / 1 dose at age 63 (or older) if you have never been vaccinated.  Tetanus, diphtheria, pertussis (Tdap, Td) immunization. / A one-time dose of Tdap vaccine if you are over 65 and have contact with an infant, are a Research scientist (physical sciences), or simply want to be protected from whooping cough. After that,  you need a Td booster dose every 10 years.  Varicella immunization.** / Consult your  caregiver.  Meningococcal immunization.** / Consult your caregiver.  Hepatitis A immunization.** / Consult your caregiver. 2 doses, 6 to 18 months apart.  Hepatitis B immunization.** / Check with your caregiver. 3 doses, usually over 6 months. ** Family history and personal history of risk and conditions may change your caregiver's recommendations. Document Released: 04/14/2001 Document Revised: 05/11/2011 Document Reviewed: 07/14/2010 [facility] Patient Information 2014 [place], [place].       Neta Mends. Lakendrick Paradis M.D. Health Maintenance  Topic Date Due  . Zostavax  05/08/2010  . Influenza Vaccine  10/31/2012  . Mammogram  12/15/2012  . Pap Smear  02/25/2013  . Colonoscopy  02/26/2021  . Tetanus/tdap  02/26/2021   Health Maintenance Review

## 2012-08-24 NOTE — Patient Instructions (Addendum)
Will notify you  of labs when available. Continue lifestyle intervention healthy eating and exercise . You bp is better . Wt Readings from Last 3 Encounters:  08/24/12 159 lb (72.122 kg)  06/30/12 162 lb (73.483 kg)  06/01/12 163 lb (73.936 kg)    Consider shingles vaccine at any time .Yearly wellness visit unless  Other indicated    Preventive Care for Adults, Female A healthy lifestyle and preventive care can promote health and wellness. Preventive health guidelines for women include the following key practices.  A routine yearly physical is a good way to check with your caregiver about your health and preventive screening. It is a chance to share any concerns and updates on your health, and to receive a thorough exam.  Visit your dentist for a routine exam and preventive care every 6 months. Brush your teeth twice a day and floss once a day. Good oral hygiene prevents tooth decay and gum disease.  The frequency of eye exams is based on your age, health, family medical history, use of contact lenses, and other factors. Follow your caregiver's recommendations for frequency of eye exams.  Eat a healthy diet. Foods like vegetables, fruits, whole grains, low-fat dairy products, and lean protein foods contain the nutrients you need without too many calories. Decrease your intake of foods high in solid fats, added sugars, and salt. Eat the right amount of calories for you.Get information about a proper diet from your caregiver, if necessary.  Regular physical exercise is one of the most important things you can do for your health. Most adults should get at least 150 minutes of moderate-intensity exercise (any activity that increases your heart rate and causes you to sweat) each week. In addition, most adults need muscle-strengthening exercises on 2 or more days a week.  Maintain a healthy weight. The body mass index (BMI) is a screening tool to identify possible weight problems. It provides an  estimate of body fat based on height and weight. Your caregiver can help determine your BMI, and can help you achieve or maintain a healthy weight.For adults 20 years and older:  A BMI below 18.5 is considered underweight.  A BMI of 18.5 to 24.9 is normal.  A BMI of 25 to 29.9 is considered overweight.  A BMI of 30 and above is considered obese.  Maintain normal blood lipids and cholesterol levels by exercising and minimizing your intake of saturated fat. Eat a balanced diet with plenty of fruit and vegetables. Blood tests for lipids and cholesterol should begin at age 37 and be repeated every 5 years. If your lipid or cholesterol levels are high, you are over 50, or you are at high risk for heart disease, you may need your cholesterol levels checked more frequently.Ongoing high lipid and cholesterol levels should be treated with medicines if diet and exercise are not effective.  If you smoke, find out from your caregiver how to quit. If you do not use tobacco, do not start.  If you are pregnant, do not drink alcohol. If you are breastfeeding, be very cautious about drinking alcohol. If you are not pregnant and choose to drink alcohol, do not exceed 1 drink per day. One drink is considered to be 12 ounces (355 mL) of beer, 5 ounces (148 mL) of wine, or 1.5 ounces (44 mL) of liquor.  Avoid use of street drugs. Do not share needles with anyone. Ask for help if you need support or instructions about stopping the use of drugs.  High blood pressure causes heart disease and increases the risk of stroke. Your blood pressure should be checked at least every 1 to 2 years. Ongoing high blood pressure should be treated with medicines if weight loss and exercise are not effective.  If you are 94 to 62 years old, ask your caregiver if you should take aspirin to prevent strokes.  Diabetes screening involves taking a blood sample to check your fasting blood sugar level. This should be done once every 3  years, after age 6, if you are within normal weight and without risk factors for diabetes. Testing should be considered at a younger age or be carried out more frequently if you are overweight and have at least 1 risk factor for diabetes.  Breast cancer screening is essential preventive care for women. You should practice "breast self-awareness." This means understanding the normal appearance and feel of your breasts and may include breast self-examination. Any changes detected, no matter how small, should be reported to a caregiver. Women in their 14s and 30s should have a clinical breast exam (CBE) by a caregiver as part of a regular health exam every 1 to 3 years. After age 61, women should have a CBE every year. Starting at age 66, women should consider having a mammography (breast X-ray test) every year. Women who have a family history of breast cancer should talk to their caregiver about genetic screening. Women at a high risk of breast cancer should talk to their caregivers about having magnetic resonance imaging (MRI) and a mammography every year.  The Pap test is a screening test for cervical cancer. A Pap test can show cell changes on the cervix that might become cervical cancer if left untreated. A Pap test is a procedure in which cells are obtained and examined from the lower end of the uterus (cervix).  Women should have a Pap test starting at age 70.  Between ages 90 and 7, Pap tests should be repeated every 2 years.  Beginning at age 66, you should have a Pap test every 3 years as long as the past 3 Pap tests have been normal.  Some women have medical problems that increase the chance of getting cervical cancer. Talk to your caregiver about these problems. It is especially important to talk to your caregiver if a new problem develops soon after your last Pap test. In these cases, your caregiver may recommend more frequent screening and Pap tests.  The above recommendations are the same  for women who have or have not gotten the vaccine for human papillomavirus (HPV).  If you had a hysterectomy for a problem that was not cancer or a condition that could lead to cancer, then you no longer need Pap tests. Even if you no longer need a Pap test, a regular exam is a good idea to make sure no other problems are starting.  If you are between ages 75 and 26, and you have had normal Pap tests going back 10 years, you no longer need Pap tests. Even if you no longer need a Pap test, a regular exam is a good idea to make sure no other problems are starting.  If you have had past treatment for cervical cancer or a condition that could lead to cancer, you need Pap tests and screening for cancer for at least 20 years after your treatment.  If Pap tests have been discontinued, risk factors (such as a new sexual partner) need to be reassessed to determine if  screening should be resumed.  The HPV test is an additional test that may be used for cervical cancer screening. The HPV test looks for the virus that can cause the cell changes on the cervix. The cells collected during the Pap test can be tested for HPV. The HPV test could be used to screen women aged 91 years and older, and should be used in women of any age who have unclear Pap test results. After the age of 28, women should have HPV testing at the same frequency as a Pap test.  Colorectal cancer can be detected and often prevented. Most routine colorectal cancer screening begins at the age of 35 and continues through age 43. However, your caregiver may recommend screening at an earlier age if you have risk factors for colon cancer. On a yearly basis, your caregiver may provide home test kits to check for hidden blood in the stool. Use of a small camera at the end of a tube, to directly examine the colon (sigmoidoscopy or colonoscopy), can detect the earliest forms of colorectal cancer. Talk to your caregiver about this at age 47, when routine  screening begins. Direct examination of the colon should be repeated every 5 to 10 years through age 58, unless early forms of pre-cancerous polyps or small growths are found.  Hepatitis C blood testing is recommended for all people born from [birth year] through 1965 and any individual with known risks for hepatitis C.  Practice safe sex. Use condoms and avoid high-risk sexual practices to reduce the spread of sexually transmitted infections (STIs). STIs include gonorrhea, chlamydia, syphilis, trichomonas, herpes, HPV, and human immunodeficiency virus (HIV). Herpes, HIV, and HPV are viral illnesses that have no cure. They can result in disability, cancer, and death. Sexually active women aged 65 and younger should be checked for chlamydia. Older women with new or multiple partners should also be tested for chlamydia. Testing for other STIs is recommended if you are sexually active and at increased risk.  Osteoporosis is a disease in which the bones lose minerals and strength with aging. This can result in serious bone fractures. The risk of osteoporosis can be identified using a bone density scan. Women ages 18 and over and women at risk for fractures or osteoporosis should discuss screening with their caregivers. Ask your caregiver whether you should take a calcium supplement or vitamin D to reduce the rate of osteoporosis.  Menopause can be associated with physical symptoms and risks. Hormone replacement therapy is available to decrease symptoms and risks. You should talk to your caregiver about whether hormone replacement therapy is right for you.  Use sunscreen with sun protection factor (SPF) of 30 or more. Apply sunscreen liberally and repeatedly throughout the day. You should seek shade when your shadow is shorter than you. Protect yourself by wearing long sleeves, pants, a wide-brimmed hat, and sunglasses year round, whenever you are outdoors.  Once a month, do a whole body skin exam, using a mirror to  look at the skin on your back. Notify your caregiver of new moles, moles that have irregular borders, moles that are larger than a pencil eraser, or moles that have changed in shape or color.  Stay current with required immunizations.  Influenza. You need a dose every fall (or winter). The composition of the flu vaccine changes each year, so being vaccinated once is not enough.  Pneumococcal polysaccharide. You need 1 to 2 doses if you smoke cigarettes or if you have certain chronic medical conditions.  You need 1 dose at age 69 (or older) if you have never been vaccinated.  Tetanus, diphtheria, pertussis (Tdap, Td). Get 1 dose of Tdap vaccine if you are younger than age 93, are over 55 and have contact with an infant, are a Research scientist (physical sciences), are pregnant, or simply want to be protected from whooping cough. After that, you need a Td booster dose every 10 years. Consult your caregiver if you have not had at least 3 tetanus and diphtheria-containing shots sometime in your life or have a deep or dirty wound.  HPV. You need this vaccine if you are a woman age 30 or younger. The vaccine is given in 3 doses over 6 months.  Measles, mumps, rubella (MMR). You need at least 1 dose of MMR if you were born in 1957 or later. You may also need a second dose.  Meningococcal. If you are age 13 to 4 and a first-year college student living in a residence hall, or have one of several medical conditions, you need to get vaccinated against meningococcal disease. You may also need additional booster doses.  Zoster (shingles). If you are age 52 or older, you should get this vaccine.  Varicella (chickenpox). If you have never had chickenpox or you were vaccinated but received only 1 dose, talk to your caregiver to find out if you need this vaccine.  Hepatitis A. You need this vaccine if you have a specific risk factor for hepatitis A virus infection or you simply wish to be protected from this disease. The vaccine is  usually given as 2 doses, 6 to 18 months apart.  Hepatitis B. You need this vaccine if you have a specific risk factor for hepatitis B virus infection or you simply wish to be protected from this disease. The vaccine is given in 3 doses, usually over 6 months. Preventive Services / Frequency Ages 85 to 70  Blood pressure check.** / Every 1 to 2 years.  Lipid and cholesterol check.** / Every 5 years beginning at age 41.  Clinical breast exam.** / Every 3 years for women in their 5s and 30s.  Pap test.** / Every 2 years from ages 22 through 55. Every 3 years starting at age 29 through age 32 or 60 with a history of 3 consecutive normal Pap tests.  HPV screening.** / Every 3 years from ages 91 through ages 80 to 103 with a history of 3 consecutive normal Pap tests.  Hepatitis C blood test.** / For any individual with known risks for hepatitis C.  Skin self-exam. / Monthly.  Influenza immunization.** / Every year.  Pneumococcal polysaccharide immunization.** / 1 to 2 doses if you smoke cigarettes or if you have certain chronic medical conditions.  Tetanus, diphtheria, pertussis (Tdap, Td) immunization. / A one-time dose of Tdap vaccine. After that, you need a Td booster dose every 10 years.  HPV immunization. / 3 doses over 6 months, if you are 49 and younger.  Measles, mumps, rubella (MMR) immunization. / You need at least 1 dose of MMR if you were born in 1957 or later. You may also need a second dose.  Meningococcal immunization. / 1 dose if you are age 10 to 50 and a first-year college student living in a residence hall, or have one of several medical conditions, you need to get vaccinated against meningococcal disease. You may also need additional booster doses.  Varicella immunization.** / Consult your caregiver.  Hepatitis A immunization.** / Consult your caregiver. 2 doses, 6  to 18 months apart.  Hepatitis B immunization.** / Consult your caregiver. 3 doses usually over 6  months. Ages 53 to 79  Blood pressure check.** / Every 1 to 2 years.  Lipid and cholesterol check.** / Every 5 years beginning at age 42.  Clinical breast exam.** / Every year after age 22.  Mammogram.** / Every year beginning at age 66 and continuing for as long as you are in good health. Consult with your caregiver.  Pap test.** / Every 3 years starting at age 1 through age 37 or 40 with a history of 3 consecutive normal Pap tests.  HPV screening.** / Every 3 years from ages 35 through ages 2 to 38 with a history of 3 consecutive normal Pap tests.  Fecal occult blood test (FOBT) of stool. / Every year beginning at age 79 and continuing until age 29. You may not need to do this test if you get a colonoscopy every 10 years.  Flexible sigmoidoscopy or colonoscopy.** / Every 5 years for a flexible sigmoidoscopy or every 10 years for a colonoscopy beginning at age 54 and continuing until age 66.  Hepatitis C blood test.** / For all people born from [birth year] through 1965 and any individual with known risks for hepatitis C.  Skin self-exam. / Monthly.  Influenza immunization.** / Every year.  Pneumococcal polysaccharide immunization.** / 1 to 2 doses if you smoke cigarettes or if you have certain chronic medical conditions.  Tetanus, diphtheria, pertussis (Tdap, Td) immunization.** / A one-time dose of Tdap vaccine. After that, you need a Td booster dose every 10 years.  Measles, mumps, rubella (MMR) immunization. / You need at least 1 dose of MMR if you were born in 1957 or later. You may also need a second dose.  Varicella immunization.** / Consult your caregiver.  Meningococcal immunization.** / Consult your caregiver.  Hepatitis A immunization.** / Consult your caregiver. 2 doses, 6 to 18 months apart.  Hepatitis B immunization.** / Consult your caregiver. 3 doses, usually over 6 months. Ages 12 and over  Blood pressure check.** / Every 1 to 2 years.  Lipid and cholesterol  check.** / Every 5 years beginning at age 38.  Clinical breast exam.** / Every year after age 10.  Mammogram.** / Every year beginning at age 36 and continuing for as long as you are in good health. Consult with your caregiver.  Pap test.** / Every 3 years starting at age 26 through age 44 or 60 with a 3 consecutive normal Pap tests. Testing can be stopped between 65 and 70 with 3 consecutive normal Pap tests and no abnormal Pap or HPV tests in the past 10 years.  HPV screening.** / Every 3 years from ages 55 through ages 63 or 7 with a history of 3 consecutive normal Pap tests. Testing can be stopped between 65 and 70 with 3 consecutive normal Pap tests and no abnormal Pap or HPV tests in the past 10 years.  Fecal occult blood test (FOBT) of stool. / Every year beginning at age 84 and continuing until age 70. You may not need to do this test if you get a colonoscopy every 10 years.  Flexible sigmoidoscopy or colonoscopy.** / Every 5 years for a flexible sigmoidoscopy or every 10 years for a colonoscopy beginning at age 56 and continuing until age 48.  Hepatitis C blood test.** / For all people born from [birth year] through 1965 and any individual with known risks for hepatitis C.  Osteoporosis screening.** /  A one-time screening for women ages 78 and over and women at risk for fractures or osteoporosis.  Skin self-exam. / Monthly.  Influenza immunization.** / Every year.  Pneumococcal polysaccharide immunization.** / 1 dose at age 49 (or older) if you have never been vaccinated.  Tetanus, diphtheria, pertussis (Tdap, Td) immunization. / A one-time dose of Tdap vaccine if you are over 65 and have contact with an infant, are a Research scientist (physical sciences), or simply want to be protected from whooping cough. After that, you need a Td booster dose every 10 years.  Varicella immunization.** / Consult your caregiver.  Meningococcal immunization.** / Consult your caregiver.  Hepatitis A immunization.** /  Consult your caregiver. 2 doses, 6 to 18 months apart.  Hepatitis B immunization.** / Check with your caregiver. 3 doses, usually over 6 months. ** Family history and personal history of risk and conditions may change your caregiver's recommendations. Document Released: 04/14/2001 Document Revised: 05/11/2011 Document Reviewed: 07/14/2010 [facility] Patient Information 2014 [place], [place].

## 2012-08-25 ENCOUNTER — Ambulatory Visit (HOSPITAL_BASED_OUTPATIENT_CLINIC_OR_DEPARTMENT_OTHER): Payer: BC Managed Care – PPO | Admitting: Hematology & Oncology

## 2012-08-25 ENCOUNTER — Other Ambulatory Visit: Payer: BC Managed Care – PPO | Admitting: Lab

## 2012-08-25 ENCOUNTER — Other Ambulatory Visit (HOSPITAL_BASED_OUTPATIENT_CLINIC_OR_DEPARTMENT_OTHER): Payer: BC Managed Care – PPO

## 2012-08-25 VITALS — BP 141/76 | HR 82 | Temp 98.0°F | Resp 16 | Ht 70.0 in | Wt 158.0 lb

## 2012-08-25 DIAGNOSIS — D473 Essential (hemorrhagic) thrombocythemia: Secondary | ICD-10-CM

## 2012-08-25 LAB — CBC WITH DIFFERENTIAL (CANCER CENTER ONLY)
BASO%: 0.6 % (ref 0.0–2.0)
EOS%: 1.6 % (ref 0.0–7.0)
HGB: 10.6 g/dL — ABNORMAL LOW (ref 11.6–15.9)
LYMPH#: 1.6 10*3/uL (ref 0.9–3.3)
MCHC: 31.7 g/dL — ABNORMAL LOW (ref 32.0–36.0)
NEUT#: 4.5 10*3/uL (ref 1.5–6.5)
Platelets: 721 10*3/uL — ABNORMAL HIGH (ref 145–400)
RDW: 17 % — ABNORMAL HIGH (ref 11.1–15.7)

## 2012-08-25 NOTE — Progress Notes (Signed)
This office note has been dictated.

## 2012-08-26 NOTE — Progress Notes (Signed)
CC:   Neta Mends. Panosh, MD  DIAGNOSIS:  Essential thrombocythemia, JAK2 negative.  CURRENT THERAPY: 1. Anagrelide 4 mg p.o. daily. 2. Aspirin 81 mg p.o. daily.  INTERIM HISTORY:  [name] comes in for followup.  She is really doing good.  She has had no problems since I last saw her.  We follow her blood work every month.  She is doing well with the anagrelide.  There has been no swelling.  She has had no rashes.  There has been no change in bowel or bladder habits.  There have been no problems with fevers, sweats, or chills.  She has had no headache. There has been no double vision or blurred vision.  PHYSICAL EXAMINATION:  General:  This is a well-developed, well- nourished black female in no obvious distress.  Vital signs: Temperature 98, pulse 82, respiratory rate 16, blood pressure 141/76. Weight is 158.  Head and neck:  Normocephalic, atraumatic skull.  There are no ocular or oral lesions.  There are no palpable cervical or supraclavicular lymph nodes.  Lungs:  Clear bilaterally.  Cardiac: Regular rate and rhythm with a normal S1, S2.  There are no murmurs, rubs, or bruits.  Abdomen:  Soft with good bowel sounds.  There is no palpable abdominal mass.  There is no fluid wave.  There is no palpable hepatosplenomegaly.  Back:  No tenderness over the spine, ribs, or hips. Extremities:  Show no clubbing, cyanosis, or edema.  Neurological: Shows no focal neurological deficits.  Skin:  No rash, ecchymosis, or petechia.  LABORATORY STUDIES:  White cell count is 7, hemoglobin 10.6, hematocrit 33.4, platelet count 721.  MCV is 81.  IMPRESSION:  [name] is a very nice 62 year old African American female with essential thrombocythemia.  Her platelet count has been stable.  Her platelet count tends to "trend" within a range.  We will go ahead and continue to follow her monthly.  I will see her back myself in 2 months.    ______________________________ Josph Macho,  M.D. PRE/MEDQ  D:  08/25/2012  T:  08/26/2012  Job:  [id number]

## 2012-08-29 NOTE — Progress Notes (Signed)
Quick Note:  Tell patient PAP is normal. And negative HPV Make sure she gets the message about the A1c. ______

## 2012-08-30 ENCOUNTER — Encounter: Payer: Self-pay | Admitting: Internal Medicine

## 2012-08-30 ENCOUNTER — Other Ambulatory Visit: Payer: Self-pay | Admitting: Internal Medicine

## 2012-08-30 DIAGNOSIS — E119 Type 2 diabetes mellitus without complications: Secondary | ICD-10-CM

## 2012-09-20 ENCOUNTER — Ambulatory Visit: Payer: BC Managed Care – PPO | Admitting: Internal Medicine

## 2012-09-21 ENCOUNTER — Other Ambulatory Visit (HOSPITAL_BASED_OUTPATIENT_CLINIC_OR_DEPARTMENT_OTHER): Payer: BC Managed Care – PPO | Admitting: Lab

## 2012-09-21 DIAGNOSIS — D473 Essential (hemorrhagic) thrombocythemia: Secondary | ICD-10-CM

## 2012-09-21 LAB — CBC WITH DIFFERENTIAL/PLATELET
BASO%: 1 % (ref 0.0–2.0)
Basophils Absolute: 0.1 10*3/uL (ref 0.0–0.1)
EOS%: 1.7 % (ref 0.0–7.0)
Eosinophils Absolute: 0.1 10*3/uL (ref 0.0–0.5)
HCT: 31.9 % — ABNORMAL LOW (ref 34.8–46.6)
HGB: 10.4 g/dL — ABNORMAL LOW (ref 11.6–15.9)
LYMPH%: 25.1 % (ref 14.0–49.7)
MCH: 25.3 pg (ref 25.1–34.0)
MCHC: 32.5 g/dL (ref 31.5–36.0)
MCV: 78 fL — ABNORMAL LOW (ref 79.5–101.0)
MONO#: 0.7 10*3/uL (ref 0.1–0.9)
MONO%: 10.2 % (ref 0.0–14.0)
NEUT#: 4 10*3/uL (ref 1.5–6.5)
NEUT%: 62 % (ref 38.4–76.8)
Platelets: 705 10*3/uL — ABNORMAL HIGH (ref 145–400)
RBC: 4.09 10*6/uL (ref 3.70–5.45)
RDW: 17.9 % — ABNORMAL HIGH (ref 11.2–14.5)
WBC: 6.4 10*3/uL (ref 3.9–10.3)
lymph#: 1.6 10*3/uL (ref 0.9–3.3)

## 2012-09-22 ENCOUNTER — Encounter: Payer: Self-pay | Admitting: Internal Medicine

## 2012-09-22 ENCOUNTER — Ambulatory Visit (INDEPENDENT_AMBULATORY_CARE_PROVIDER_SITE_OTHER): Payer: BC Managed Care – PPO | Admitting: Internal Medicine

## 2012-09-22 VITALS — BP 140/80 | HR 80 | Temp 98.5°F | Ht 70.0 in | Wt 156.0 lb

## 2012-09-22 DIAGNOSIS — E119 Type 2 diabetes mellitus without complications: Secondary | ICD-10-CM | POA: Insufficient documentation

## 2012-09-22 MED ORDER — ONETOUCH LANCETS MISC: Status: DC

## 2012-09-22 MED ORDER — ONETOUCH ULTRA SYSTEM W/DEVICE KIT: 1.0000 | PACK | Freq: Once | Status: DC

## 2012-09-22 MED ORDER — GLUCOSE BLOOD VI STRP: ORAL_STRIP | Status: DC

## 2012-09-22 NOTE — Progress Notes (Signed)
Patient ID: Alexandra [name], female   DOB: 1950-09-14, 62 y.o.   MRN: [id number]  HPI: Alexandra [name] is a 62 y.o.-year-old female, referred by her PCP, Dr. Fabian Sharp, for management of DM2, non-insulin-dependent, controlled, without complications.  Patient has been diagnosed with diabetes in 12/2011; she has not been on insulin before. Last hemoglobin A1c was: Lab Results  Component Value Date   HGBA1C 7.1* 08/24/2012  previous values have been 6.9% and 6.8%.   Pt is not on medication for her diabetes.  Pt does not check sugars as she does not have a meter. ? if has hypoglycemia awareness.  Pt's meals are: - Breakfast: OJ 8 oz - Lunch: grilled chicken sandwich or chicken salad, maybe hamburger - Dinner: shrimp/fish + salad/veggies: broccoli/spinach/sweet potatoes - Snacks: walnuts; peach mostly She is exercising by walking 3x per week - 30-45 min a day, and losing weight intentionally - lost 20 lbs.  Cut out pasta, refined sugars, sodas.  Pt does not have chronic kidney disease, last BUN/creatinine was:  Lab Results  Component Value Date   BUN 18 04/19/2012   CREATININE 0.7 04/19/2012  she is on lisinopril 20 mg daily.  Last set of lipids: Lab Results  Component Value Date   CHOL 208* 04/19/2012   HDL 60.60 04/19/2012   LDLCALC 130* 02/18/2011   LDLDIRECT 62 04/19/2012   TRIG 60.0 04/19/2012   CHOLHDL 3 04/19/2012   Pt did not have a dilated eye exam . No DR. Denies numbness and tingling in her legs.  I reviewed her chart and she also has a history of vitamin D deficiency, hypertension, headaches, iron deficiency anemia - with the last hemoglobin of 10.6, on iron therapy, with low MCV and high RDW. 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 Last TSH 0.84 on 04/19/2012.  Pt has FH of DM in mother.  ROS: Constitutional: + weight loss, no fatigue, no subjective hyperthermia/hypothermia Eyes: no blurry vision, no  xerophthalmia ENT: no sore throat, no nodules palpated in throat, no dysphagia/odynophagia, no hoarseness Cardiovascular: no CP/SOB/palpitations/leg swelling Respiratory: no cough/SOB Gastrointestinal: no N/V/D/C Musculoskeletal: no muscle/joint aches Skin: no rashes Neurological: no tremors/numbness/tingling/dizziness Psychiatric: no depression/anxiety  Past Medical History  Diagnosis Date  . Hypertension     echo   . Thrombocytosis    Past Surgical History  Procedure Laterality Date  . Ectopic pregnancy surgery  1984  . Bone marrow biopsy     History   Social History  . Marital Status: Married    Spouse Name: N/A    Number of Children: 2   Occupational History  . Administrative support associate   Social History Main Topics  . Smoking status: Never Smoker   . Smokeless tobacco: Not on file  . Alcohol Use: No  . 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   No tobacco no ethoh little caffiene .    Current Outpatient Prescriptions on File Prior to Visit  Medication Sig Dispense Refill  . amLODipine (NORVASC) 5 MG tablet Take 1 tablet (5 mg total) by mouth daily.  90 tablet  3  . anagrelide (AGRYLIN) 1 MG capsule Take 4 capsules (4 mg total) by mouth daily. Pt takes 4 tabs daily  120 capsule  6  . Fe Fum-FePoly-Vit C-Vit B3 (INTEGRA) 62.5-62.5-40-3 MG CAPS  Take 1 capsule by mouth daily.      Marland Kitchen lisinopril (PRINIVIL,ZESTRIL) 20 MG tablet Take 2 tablets (40 mg total) by mouth daily.  180 tablet  3  . Multiple Vitamin (MULTI-VITAMIN DAILY PO) Take by mouth every morning.      . Nebivolol HCl (BYSTOLIC) 20 MG TABS Take 1 tablet (20 mg total) by mouth every morning.  30 tablet  11   No current facility-administered medications on file prior to visit.   No Known Allergies  Family History  Problem Relation Age of Onset  . Heart disease Mother     died age 82  . Heart attack Father 40     massive  . Hypertension Sister   . Hypertension Sister   . Hypertension Sister   . Hypertension Sister   . Arthritis      family hx  . Diabetes      family hx  . Kidney disease Mother     family hx  . Stroke       dialysis   PE: BP 140/80  Pulse 80  Temp(Src) 98.5 F (36.9 C) (Oral)  Ht 5\' 10"  (1.778 m)  Wt 156 lb (70.761 kg)  BMI 22.38 kg/m2 Wt Readings from Last 3 Encounters:  09/22/12 156 lb (70.761 kg)  08/25/12 158 lb (71.668 kg)  08/24/12 159 lb (72.122 kg)   Constitutional: normal weight, in NAD Eyes: PERRLA, EOMI, no exophthalmos ENT: moist mucous membranes, no thyromegaly, no cervical lymphadenopathy Cardiovascular: RRR, No MRG Respiratory: CTA B Gastrointestinal: abdomen soft, NT, ND, BS+ Musculoskeletal: no deformities, strength intact in all 4 Skin: moist, warm, no rashes Neurological: no tremor with outstretched hands, DTR normal in all 4  ASSESSMENT: 1. DM2, non-insulin-dependent, controlled, without complications  PLAN:  1.  Pt with mild, fairly recent onset diabetes, on no medication. As her HbA1C was trending high despite lifestyle changes, her PCP was concerned and referred her to me to see if she might need medication.  - we had a long discussion about the fact that she, indeed, has diabetes (was questioning this diagnosis), and we discussed about criteria for diagnosis of prediabetes and diabetes. Also, we discussed the natural evolution of diabetes, which is usually towards requiring medicines, however, I think that at this point, we can work on her diet, and try to postpone starting medications. I gave her specific examples about healthy, nutritious, foods (please see patient's instructions). I advised her to avoid drinking the 8 ounce of orange juice in the morning, since this will greatly raise her sugars. We discussed about glycemic index of foods, and which foods to avoid or reduce in quantity. - I also advised her to increase her walking speed,  from 2 to 3 miles for 45 minutes. Also, to try to do this as often as she can, before dinner - I also sent a prescription for the meter, strips, and lancets to her pharmacy and I advised her to start checking sugars. We demonstrated in the office how to do this. - she just rated about her weight gain, and asks me whether this can be related to medicines, and the only medicine on her list that might be able to cause weight gain or worsening diabetes control is probably Bystolic. She is taking this for blood pressure, and indeed her blood pressure today is at upper limits of the target for diabetics. - given sugar log and advised how to fill it and to bring it at next appt - check once  a day, rotating checks - given foot care handout and explained the principles - given instructions for hypoglycemia management "15-15 rule" - RTC in 3 mo with sugar log, if we need to start medication at that point, depending on the timing of her higher but not sugars, we can start either metformin or Januvia.

## 2012-09-22 NOTE — Patient Instructions (Addendum)
Please return in 3 month with your sugar log.    Please consider the following ways to cut down carbs and fat and increase fiber and micronutrients in your diet:  - substitute whole grain for white bread or pasta - substitute brown rice for white rice - substitute 90-calorie flat bread pieces for slices of bread when possible - substitute sweet potatoes or yams for white potatoes - substitute humus for margarine - substitute tofu for cheese when possible - substitute almond or rice milk for regular milk (would not drink soy milk daily due to concern for soy estrogen influence on breast cancer risk) - substitute dark chocolate for other sweets when possible - substitute water - can add lemon or orange slices for taste - for diet sodas (artificial sweeteners will trick your body that you can eat sweets without getting calories and will lead you to overeating and weight gain in the long run) - do not skip breakfast or other meals (this will slow down the metabolism and will result in more weight gain over time)  - can try smoothies made from fruit and almond/rice milk in am instead of regular breakfast - can also try old-fashioned (not instant) oatmeal made with almond/rice milk in am - order the dressing on the side when eating salad at a restaurant (pour less than half of the dressing on the salad) - eat as little meat as possible - can try juicing, but should not forget that juicing will get rid of the fiber, so would alternate with eating raw veg./fruits or drinking smoothies - use as little oil as possible, even when using olive oil - can dress a salad with a mix of balsamic vinegar and lemon juice, for e.g. - use agave nectar, stevia sugar, or regular sugar rather than artificial sweateners - steam or broil/roast veggies  - snack on veggies/fruit/nuts (unsalted, preferably) when possible, rather than processed foods - reduce or eliminate aspartame in diet (it is in diet sodas, chewing gum,  etc) Read the labels!  Try to read Dr. Katherina Right book: "Program for Reversing Diabetes" for the vegan concept and other ideas for healthy eating.   PATIENT INSTRUCTIONS FOR TYPE 2 DIABETES:  **Please join MyChart!** - see attached instructions about how to join   DIET AND EXERCISE Diet and exercise is an important part of diabetic treatment.  We recommended aerobic exercise in the form of brisk walking (working between 40-60% of maximal aerobic capacity, similar to brisk walking) for 150 minutes per week (such as 30 minutes five days per week) along with 3 times per week performing 'resistance' training (using various gauge rubber tubes with handles) 5-10 exercises involving the major muscle groups (upper body, lower body and core) performing 10-15 repetitions (or near fatigue) each exercise. Start at half the above goal but build slowly to reach the above goals. If limited by weight, joint pain, or disability, we recommend daily walking in a swimming pool with water up to waist to reduce pressure from joints while allow for adequate exercise.    BLOOD GLUCOSES Monitoring your blood glucoses is important for continued management of your diabetes. Please check your blood glucoses 1-2 times a day: fasting, before meals and at bedtime (you can rotate these measurements - e.g. one day check before the 3 meals, the next day check before 2 of the meals and before bedtime, etc.   HYPOGLYCEMIA (low blood sugar) Hypoglycemia is usually a reaction to not eating, exercising, or taking too much insulin/  other diabetes drugs.  Symptoms include tremors, sweating, hunger, confusion, headache, etc. Treat IMMEDIATELY with 15 grams of Carbs:   4 glucose tablets    cup regular juice/soda   2 tablespoons raisins   4 teaspoons sugar   1 tablespoon honey Recheck blood glucose in 15 mins and repeat above if still symptomatic/blood glucose <100. Please contact our office at [phone] if you have  questions about how to next handle your insulin.  RECOMMENDATIONS TO REDUCE YOUR RISK OF DIABETIC COMPLICATIONS: * Take your prescribed MEDICATION(S). * Follow a DIABETIC diet: Complex carbs, fiber rich foods, heart healthy fish twice weekly, (monounsaturated and polyunsaturated) fats * AVOID saturated/trans fats, high fat foods, >2,300 mg salt per day. * EXERCISE at least 5 times a week for 30 minutes or preferably daily.  * DO NOT SMOKE OR DRINK more than 1 drink a day. * Check your FEET every day. Do not wear tightfitting shoes. Contact us if you develop an ulcer * See your EYE doctor once a year or more if needed * Get a FLU shot once a year * Get a PNEUMONIA vaccine once before and once after age 71 years  GOALS:  * Your Hemoglobin A1c of <7%  * fasting sugars need to be <130 * after meals sugars need to be <180 (2h after you start eating) * Your Systolic BP should be 140 or lower  * Your Diastolic BP should be 80 or lower  * Your HDL (Good Cholesterol) should be 40 or higher  * Your LDL (Bad Cholesterol) should be 100 or lower  * Your Triglycerides should be 150 or lower  * Your Urine microalbumin (kidney function) should be <30 * Your Body Mass Index should be 25 or lower   We will be glad to help you achieve these goals. Our telephone number is: [phone].

## 2012-09-27 ENCOUNTER — Telehealth: Payer: Self-pay | Admitting: Internal Medicine

## 2012-09-27 NOTE — Telephone Encounter (Signed)
PT called and stated that when she seen Dr. Dan Europe, that the Dr told her that her Nebivolol HCl (BYSTOLIC) 20 MG TABS was causing her a1c to be high. She is questioning if she can come off of this for a period of 3 months, until she is able to follow up with Dr. Dan Europe and see if her a1c levels have changed. She is also requesting a meter to check her sugar. Please assist.

## 2012-09-28 NOTE — Telephone Encounter (Signed)
I spoke to the pt.  She would like to stop the medication and start something new or stop it all together and have lab work done in 3 months.  Please advise.  Thanks!

## 2012-09-28 NOTE — Telephone Encounter (Signed)
I am not aware that this medicine causes  Elevated blood sugar  .  It too a good while before we were able to have a medicine that works   To control your  blood pressure.  dont stop the medication suddenly  I will contact dr Elvera Lennox about advisability of trying othe BP medications

## 2012-10-03 NOTE — Telephone Encounter (Signed)
Dr Wyonia Hough didn't   say has to go off    But if patient wants to change medication  Then make OV to  discuss     Because will have to wean this med  slowly to avoid rebound hypertension and phase in a different med  .

## 2012-10-04 NOTE — Telephone Encounter (Signed)
Your next available f/u appt is 8/27. Should I bring her in sooner by using a same day? Thank you!

## 2012-10-04 NOTE — Telephone Encounter (Signed)
Please call the pt and make appt with [facility] to discuss medication change/stop.  Thanks!

## 2012-10-05 NOTE — Telephone Encounter (Signed)
Pt notified to pick up samples.  She has made appt for 10/26/12 @ 12:30 to see WP.

## 2012-10-05 NOTE — Telephone Encounter (Signed)
lmovm /ga °

## 2012-10-05 NOTE — Telephone Encounter (Signed)
Please reassure her that the bystolic may not be a big  Influence on her situation   And other meds may  Be problematic also .   If she doesn't want to wait until appt  Can  Give her samples of 10 mg bystolic and have her take 15 mg per day for   10 days and   If BP ok can try 10 mg per day until seen  The last week of august .

## 2012-10-13 ENCOUNTER — Other Ambulatory Visit: Payer: Self-pay | Admitting: Internal Medicine

## 2012-10-13 DIAGNOSIS — Z1231 Encounter for screening mammogram for malignant neoplasm of breast: Secondary | ICD-10-CM

## 2012-10-25 ENCOUNTER — Ambulatory Visit (HOSPITAL_COMMUNITY)
Admission: RE | Admit: 2012-10-25 | Discharge: 2012-10-25 | Disposition: A | Discharge status: Home / Self Care | Payer: BC Managed Care – PPO | Source: Ambulatory Visit | Attending: Internal Medicine | Admitting: Internal Medicine

## 2012-10-25 DIAGNOSIS — Z1231 Encounter for screening mammogram for malignant neoplasm of breast: Secondary | ICD-10-CM | POA: Insufficient documentation

## 2012-10-26 ENCOUNTER — Encounter: Payer: Self-pay | Admitting: Internal Medicine

## 2012-10-26 ENCOUNTER — Ambulatory Visit (INDEPENDENT_AMBULATORY_CARE_PROVIDER_SITE_OTHER): Payer: BC Managed Care – PPO | Admitting: Internal Medicine

## 2012-10-26 VITALS — BP 140/92 | Temp 98.5°F | Wt 155.0 lb

## 2012-10-26 DIAGNOSIS — I10 Essential (primary) hypertension: Secondary | ICD-10-CM

## 2012-10-26 DIAGNOSIS — E119 Type 2 diabetes mellitus without complications: Secondary | ICD-10-CM

## 2012-10-26 DIAGNOSIS — D473 Essential (hemorrhagic) thrombocythemia: Secondary | ICD-10-CM

## 2012-10-26 NOTE — Patient Instructions (Signed)
i a gree that benefit is more than risk   At this time as it seems to help your blood pressure.  Make sure    Blood readings coming down .  Go back to 20 mg bystolic. For now .   Can consider   Checking glucose level 2 hours   After eating.

## 2012-10-26 NOTE — Progress Notes (Signed)
Chief Complaint  Patient presents with  . Follow-up    HPI:  Fu of bp and med change  Since  last visit has dec to 15 mg bystolic ; hasnt checked bp .   Dm uncertain control   140 range   To recheck October  Doing lsi for now  Has ? About intervetions Mom  Was on dialysis and  Dm.  To be seen in October .  No change in  Other meds .   ROS: See pertinent positives and negatives per HPI.  Past Medical History  Diagnosis Date  . Hypertension     echo   . Thrombocytosis     Family History  Problem Relation Age of Onset  . Heart disease Mother     died age 28  . Heart attack Father 15    massive  . Hypertension Sister   . Hypertension Sister   . Hypertension Sister   . Hypertension Sister   . Arthritis      family hx  . Diabetes      family hx  . Kidney disease Mother     family hx  . Stroke       dialysis    History   Social History  . Marital Status: Married    Spouse Name: N/A    Number of Children: N/A  . Years of Education: N/A   Social History Main Topics  . Smoking status: Never Smoker   . Smokeless tobacco: None  . Alcohol Use: No  . Drug Use: No  . Sexual Activity: None   Other Topics Concern  . 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   No tobacco no ethoh little caffiene .     Outpatient Encounter Prescriptions as of 10/26/2012  Medication Sig Dispense Refill  . amLODipine (NORVASC) 5 MG tablet Take 1 tablet (5 mg total) by mouth daily.  90 tablet  3  . anagrelide (AGRYLIN) 1 MG capsule Take 4 capsules (4 mg total) by mouth daily. Pt takes 4 tabs daily  120 capsule  6  . aspirin 81 MG tablet Take 81 mg by mouth daily.      . Blood Glucose Monitoring Suppl (ONE TOUCH ULTRA SYSTEM KIT) W/DEVICE KIT 1 kit by Does not apply route once.  1 each  0  . Fe Fum-FePoly-Vit C-Vit B3 (INTEGRA) 62.5-62.5-40-3 MG CAPS Take 1 capsule by mouth daily.      Marland Kitchen glucose blood test strip  Check sugars 1x a day  100 each  12  . lisinopril (PRINIVIL,ZESTRIL) 20 MG tablet Take 2 tablets (40 mg total) by mouth daily.  180 tablet  3  . Multiple Vitamin (MULTI-VITAMIN DAILY PO) Take by mouth every morning.      . Nebivolol HCl (BYSTOLIC) 20 MG TABS Take 1 tablet (20 mg total) by mouth every morning.  30 tablet  11  . ONE TOUCH LANCETS MISC Check sugars 1x a day  200 each  2   No facility-administered encounter medications on file as of 10/26/2012.    EXAM:  BP 140/92  Temp(Src) 98.5 F (36.9 C) (Oral)  Wt 155 lb (70.308 kg)  BMI 22.24 kg/m2  Body mass index is 22.24 kg/(m^2).  GENERAL: vitals reviewed and listed above, alert, oriented, appears well hydrated and in no acute distress Repeat BP right  150/82 left 152/80 PSYCH: pleasant and cooperative, no obvious depression or anxiety Lab Results  Component Value Date   WBC 6.4 09/21/2012   HGB 10.4* 09/21/2012   HCT 31.9* 09/21/2012   PLT 705* 09/21/2012   GLUCOSE 95 04/19/2012   CHOL 208* 04/19/2012   TRIG 60.0 04/19/2012   HDL 60.60 04/19/2012   LDLDIRECT 127.3 04/19/2012   LDLCALC 130* 02/18/2011   ALT 18 04/19/2012   AST 17 04/19/2012   NA 141 04/19/2012   K 3.8 04/19/2012   CL 108 04/19/2012   CREATININE 0.7 04/19/2012   BUN 18 04/19/2012   CO2 27 04/19/2012   TSH 0.84 04/19/2012   HGBA1C 7.1* 08/24/2012    ASSESSMENT AND PLAN:  Discussed the following assessment and plan:  HYPERTENSION - creeping  up after dec dose bystolic . difficult time trying to get control initially and done think contributing that much to sugar elevation  THROMBOCYTHEMIA  Diabetes mellitus type 2, controlled, without complications  -Patient advised to return or notify health care team  if symptoms worsen or persist or new concerns arise.  Patient Instructions  i a gree that benefit is more than risk   At this time as it seems to help your blood pressure.  Make sure    Blood readings coming down .  Go back to 20 mg bystolic. For now .    Can consider   Checking glucose level 2 hours   After eating.       Neta Mends. Vester Balthazor M.D.  Total visit > 50% spent counseling and coordinating care

## 2012-10-27 ENCOUNTER — Other Ambulatory Visit: Payer: Self-pay | Admitting: Internal Medicine

## 2012-10-27 ENCOUNTER — Ambulatory Visit (HOSPITAL_BASED_OUTPATIENT_CLINIC_OR_DEPARTMENT_OTHER): Payer: BC Managed Care – PPO | Admitting: Hematology & Oncology

## 2012-10-27 ENCOUNTER — Other Ambulatory Visit (HOSPITAL_BASED_OUTPATIENT_CLINIC_OR_DEPARTMENT_OTHER): Payer: BC Managed Care – PPO | Admitting: Lab

## 2012-10-27 VITALS — BP 157/88 | HR 81 | Temp 98.2°F | Resp 14 | Ht 69.0 in | Wt 152.0 lb

## 2012-10-27 DIAGNOSIS — R928 Other abnormal and inconclusive findings on diagnostic imaging of breast: Secondary | ICD-10-CM

## 2012-10-27 DIAGNOSIS — N63 Unspecified lump in unspecified breast: Secondary | ICD-10-CM

## 2012-10-27 DIAGNOSIS — D473 Essential (hemorrhagic) thrombocythemia: Secondary | ICD-10-CM

## 2012-10-27 LAB — CBC WITH DIFFERENTIAL (CANCER CENTER ONLY)
BASO#: 0.1 10*3/uL (ref 0.0–0.2)
EOS%: 1.9 % (ref 0.0–7.0)
HGB: 11.4 g/dL — ABNORMAL LOW (ref 11.6–15.9)
MCH: 26 pg (ref 26.0–34.0)
MCHC: 32.3 g/dL (ref 32.0–36.0)
MONO%: 9 % (ref 0.0–13.0)
NEUT#: 4 10*3/uL (ref 1.5–6.5)
NEUT%: 62.1 % (ref 39.6–80.0)
RDW: 17.7 % — ABNORMAL HIGH (ref 11.1–15.7)

## 2012-10-27 LAB — IRON AND TIBC CHCC
TIBC: 221 ug/dL — ABNORMAL LOW (ref 236–444)
UIBC: 132 ug/dL (ref 120–384)

## 2012-10-27 NOTE — Progress Notes (Signed)
This office note has been dictated.

## 2012-10-28 NOTE — Progress Notes (Signed)
CC:   Alexandra Mends. Panosh, MD  DIAGNOSIS:  Essential thrombocythemia-JAK2 negative.  CURRENT THERAPY: 1. Anagrelide 4 mg p.o. daily. 2. Aspirin 81 mg p.o. daily.  INTERVAL HISTORY:  The patient comes in for followup.  She is doing okay.  She has lost a little weight since we last saw her.  She now has been told she is diabetic.  She is being followed by Dr. Fabian [name] for this.  She is not on any diabetic medications right now.  She is checking her blood sugars daily.  The patient had a mammogram done 2 days ago.  This showed a suspicious mass in the right breast.  She will probably need have an ultrasound and possible biopsy.  She has had no bony pain issues.  There has been no change in bowel or bladder habits.  She has had no leg swelling.  She has had no cough or shortness of breath.  There has been no bleeding.  There has been no pain or burning in her hands or feet.  PHYSICAL EXAMINATION:  General:  This is a well-developed, well- nourished African American female in no obvious distress.  Vital signs: Temperature of 98.2, pulse 81, respiratory 14, blood pressure 157/88. Weight is 152.  Head and neck:  Normocephalic, atraumatic skull.  There are no ocular or oral lesions.  There are no palpable cervical or supraclavicular lymph nodes.  Lungs:  Clear bilaterally.  Cardiac: Regular rate and rhythm with a normal S1, S2.  There are no murmurs, rubs or bruits.  Abdomen:  Soft.  She has good bowel sounds.  There is no palpable abdominal mass.  There is no palpable hepatosplenomegaly. Extremities:  Show no clubbing, cyanosis or edema.  Skin:  No rash, ecchymosis, or petechia.  LABORATORY STUDIES:  White cell count 6.4, hemoglobin 11.4, hematocrit 35.3, platelet count is 802, MCV is 81.  IMPRESSION:  Alexandra [name] is a very nice 62 year old African American female with essential thrombocythemia.  She is holding her own right now.  Again, her platelet count is on the high side, but she has  been totally asymptomatic with this.  I am not sure what is going on with respect to the right breast.  This will be worked up and we will follow this up.  I want to see her back in another 6 weeks.    ______________________________ Josph Macho, M.D. PRE/MEDQ  D:  10/27/2012  T:  10/28/2012  Job:  [id number]

## 2012-11-15 ENCOUNTER — Ambulatory Visit
Admission: RE | Admit: 2012-11-15 | Discharge: 2012-11-15 | Disposition: A | Discharge status: Home / Self Care | Payer: BC Managed Care – PPO | Source: Ambulatory Visit | Attending: Internal Medicine | Admitting: Internal Medicine

## 2012-11-15 DIAGNOSIS — R928 Other abnormal and inconclusive findings on diagnostic imaging of breast: Secondary | ICD-10-CM

## 2012-11-16 ENCOUNTER — Other Ambulatory Visit (HOSPITAL_BASED_OUTPATIENT_CLINIC_OR_DEPARTMENT_OTHER): Payer: BC Managed Care – PPO | Admitting: Lab

## 2012-11-16 DIAGNOSIS — D473 Essential (hemorrhagic) thrombocythemia: Secondary | ICD-10-CM

## 2012-11-16 LAB — CBC WITH DIFFERENTIAL/PLATELET
BASO%: 1.1 % (ref 0.0–2.0)
Basophils Absolute: 0.1 10*3/uL (ref 0.0–0.1)
EOS%: 1.7 % (ref 0.0–7.0)
HGB: 10.4 g/dL — ABNORMAL LOW (ref 11.6–15.9)
MCH: 25.5 pg (ref 25.1–34.0)
MCHC: 32.9 g/dL (ref 31.5–36.0)
MCV: 77.5 fL — ABNORMAL LOW (ref 79.5–101.0)
MONO%: 10.9 % (ref 0.0–14.0)
RDW: 17.6 % — ABNORMAL HIGH (ref 11.2–14.5)
lymph#: 1.8 10*3/uL (ref 0.9–3.3)

## 2012-11-24 ENCOUNTER — Encounter (INDEPENDENT_AMBULATORY_CARE_PROVIDER_SITE_OTHER): Payer: Self-pay | Admitting: Ophthalmology

## 2012-12-08 ENCOUNTER — Other Ambulatory Visit (HOSPITAL_BASED_OUTPATIENT_CLINIC_OR_DEPARTMENT_OTHER): Payer: BC Managed Care – PPO | Admitting: Lab

## 2012-12-08 ENCOUNTER — Ambulatory Visit (HOSPITAL_BASED_OUTPATIENT_CLINIC_OR_DEPARTMENT_OTHER): Payer: BC Managed Care – PPO | Admitting: Hematology & Oncology

## 2012-12-08 VITALS — BP 143/74 | HR 96 | Temp 98.7°F | Resp 14 | Ht 69.0 in | Wt 150.0 lb

## 2012-12-08 DIAGNOSIS — D473 Essential (hemorrhagic) thrombocythemia: Secondary | ICD-10-CM

## 2012-12-08 LAB — CBC WITH DIFFERENTIAL (CANCER CENTER ONLY)
EOS%: 1.2 % (ref 0.0–7.0)
Eosinophils Absolute: 0.1 10*3/uL (ref 0.0–0.5)
HGB: 10.1 g/dL — ABNORMAL LOW (ref 11.6–15.9)
LYMPH#: 1.2 10*3/uL (ref 0.9–3.3)
MCH: 25.6 pg — ABNORMAL LOW (ref 26.0–34.0)
MCHC: 31.8 g/dL — ABNORMAL LOW (ref 32.0–36.0)
MONO%: 10.4 % (ref 0.0–13.0)
NEUT#: 4.5 10*3/uL (ref 1.5–6.5)
Platelets: 886 10*3/uL — ABNORMAL HIGH (ref 145–400)
RBC: 3.95 10*6/uL (ref 3.70–5.32)

## 2012-12-08 NOTE — Progress Notes (Signed)
This office note has been dictated.

## 2012-12-09 LAB — IRON AND TIBC CHCC
Iron: 74 ug/dL (ref 41–142)
TIBC: 211 ug/dL — ABNORMAL LOW (ref 236–444)
UIBC: 137 ug/dL (ref 120–384)

## 2012-12-09 NOTE — Progress Notes (Signed)
CC:   Alexandra [name]. Panosh, MD  DIAGNOSIS:  Essential thrombocythemia, JAK2 negative.  CURRENT THERAPY: 1. Anagrelide 4 mg p.o. daily. 2. Aspirin 81 mg p.o. daily.  INTERIM HISTORY:  Alexandra [name] comes in for followup.  She is doing fairly well.  She has had no complaints since we last saw her. She has been getting her labs checked monthly.  Her platelet count continues to trend upward.  She has had no problems with burning in the hands or feet.  There is no headache.  There is no change in bowel or bladder habits.  She has had no issues with cough or shortness of breath.  She had something on her mammogram.  This was back in September.  The ultrasound that she had with this showed a normal breast without any abnormalities.  She has had no rashes.  There has been no leg swelling.  The patient states she has had no change in medications.  PHYSICAL EXAMINATION:  General:  This is a well-developed, well- nourished African American female in no obvious distress.  Vital signs: Temperature of 98.7, pulse 96, respiratory rate 14, blood pressure 143/74.  Weight is 150 pounds.  Head and neck:  Normocephalic, atraumatic skull.  There are no ocular or oral lesions.  There are no palpable cervical or supraclavicular lymph nodes.  Lungs:  Clear bilaterally.  Cardiac:  Regular rate and rhythm with a normal S1 and S2. There are no murmurs, rubs or bruits.  Abdomen:  Soft.  She has good bowel sounds.  There is no fluid wave.  There is no palpable hepatosplenomegaly.  Back:  No tenderness over the spine, ribs, or hips. Extremities:  Show no clubbing, cyanosis or edema.  She has good range motion of her joints.  She has good strength bilaterally.  Neurological: Shows no focal neurological deficits.  Skin:  Shows no erythema on her hands or feet.  There are no rashes.  LABORATORY STUDIES:  White cell count is 6.5, hemoglobin 10.1, hematocrit 31.8, platelet count is 886.  MCV is 81.  Peripheral smear  shows some mild anisocytosis.  There may be a little bit of poikilocytosis.  There are no nucleated red blood cells.  I see no teardrop cells.  There are no target cells.  White cells appear normal in morphology and maturation.  There are no immature myeloid or lymphoid forms.  There are no blasts.  Platelets are markedly increased in number.  She has well-granulated platelets.  She has some large platelets.  IMPRESSION:  Alexandra [name] is a 63 year old African American female with essential thrombocythemia.  Again, she is JAK2 negative.  I really think we are going to have to do a bone marrow test on her.  It has been 9 years since she had one done.  I am also going to consider sending a calreticulin assay off.  This is positive in patients who are JAK2 negative I think 80% of the time.  I do think another bone marrow test is going to be indicated.  I spoke to Alexandra [name] about this.  With her busy, busy work schedule, it is going to be tough to do this until after Christmas.  We will set her up for December 30th.  I want to get her blood counts every 2 weeks for right now.  I will not adjust the dose of anagrelide at the present time.  I am checking her iron studies.  I will see her back after the holidays.  I want to make sure when I see her back her bone marrow results will be back.  I spent about 45 minutes with her today.  This is a pretty complex situation as she seems to be progressing despite fairly aggressive anagrelide dosing.    ______________________________ Josph Macho, M.D. PRE/MEDQ  D:  12/08/2012  T:  12/09/2012  Job:  [id number]

## 2012-12-17 ENCOUNTER — Other Ambulatory Visit: Payer: Self-pay | Admitting: Hematology & Oncology

## 2012-12-20 ENCOUNTER — Encounter (INDEPENDENT_AMBULATORY_CARE_PROVIDER_SITE_OTHER): Payer: Self-pay | Admitting: Ophthalmology

## 2012-12-22 ENCOUNTER — Other Ambulatory Visit (HOSPITAL_BASED_OUTPATIENT_CLINIC_OR_DEPARTMENT_OTHER): Payer: BC Managed Care – PPO | Admitting: Lab

## 2012-12-22 DIAGNOSIS — D473 Essential (hemorrhagic) thrombocythemia: Secondary | ICD-10-CM

## 2012-12-22 LAB — CBC WITH DIFFERENTIAL/PLATELET
Basophils Absolute: 0.1 10*3/uL (ref 0.0–0.1)
EOS%: 1.6 % (ref 0.0–7.0)
HCT: 30.1 % — ABNORMAL LOW (ref 34.8–46.6)
HGB: 9.8 g/dL — ABNORMAL LOW (ref 11.6–15.9)
LYMPH%: 20.6 % (ref 14.0–49.7)
MCH: 25.3 pg (ref 25.1–34.0)
MCHC: 32.7 g/dL (ref 31.5–36.0)
MCV: 77.6 fL — ABNORMAL LOW (ref 79.5–101.0)
MONO%: 11.1 % (ref 0.0–14.0)
NEUT%: 65.7 % (ref 38.4–76.8)

## 2012-12-23 ENCOUNTER — Ambulatory Visit: Payer: BC Managed Care – PPO | Admitting: Internal Medicine

## 2012-12-26 ENCOUNTER — Encounter (INDEPENDENT_AMBULATORY_CARE_PROVIDER_SITE_OTHER): Payer: BC Managed Care – PPO | Admitting: Ophthalmology

## 2012-12-26 ENCOUNTER — Encounter: Payer: Self-pay | Admitting: Internal Medicine

## 2012-12-26 ENCOUNTER — Ambulatory Visit (INDEPENDENT_AMBULATORY_CARE_PROVIDER_SITE_OTHER): Payer: BC Managed Care – PPO | Admitting: Internal Medicine

## 2012-12-26 VITALS — BP 140/82 | HR 80 | Temp 98.0°F | Resp 10 | Wt 149.4 lb

## 2012-12-26 DIAGNOSIS — H43819 Vitreous degeneration, unspecified eye: Secondary | ICD-10-CM

## 2012-12-26 DIAGNOSIS — E11319 Type 2 diabetes mellitus with unspecified diabetic retinopathy without macular edema: Secondary | ICD-10-CM

## 2012-12-26 DIAGNOSIS — H35039 Hypertensive retinopathy, unspecified eye: Secondary | ICD-10-CM

## 2012-12-26 DIAGNOSIS — H33309 Unspecified retinal break, unspecified eye: Secondary | ICD-10-CM

## 2012-12-26 DIAGNOSIS — H2510 Age-related nuclear cataract, unspecified eye: Secondary | ICD-10-CM

## 2012-12-26 DIAGNOSIS — E1139 Type 2 diabetes mellitus with other diabetic ophthalmic complication: Secondary | ICD-10-CM

## 2012-12-26 DIAGNOSIS — E119 Type 2 diabetes mellitus without complications: Secondary | ICD-10-CM

## 2012-12-26 DIAGNOSIS — I10 Essential (primary) hypertension: Secondary | ICD-10-CM

## 2012-12-26 NOTE — Patient Instructions (Signed)
Keep up the great work! Please return in 3 months with your sugar log.

## 2012-12-26 NOTE — Progress Notes (Signed)
Patient ID: [name], female   DOB: [birth year], 62 y.o.   MRN: [id number]  HPI: Alexandra [name] is a 62 y.o.-year-old female, returning fot f/u for DM2, dx 12/2011, non-insulin-dependent, controlled, without complications. Last visit 3 mo ago.  Last hemoglobin A1c was: Lab Results  Component Value Date   HGBA1C 7.1* 08/24/2012   HGBA1C 6.9* 04/19/2012   HGBA1C 6.8* 01/04/2012    Pt is not on medication for her diabetes.  Pt started to check sugars >> sugars checked at different times of the day >> 80-139 She is exercising by walking 3x per week - 30-45 min a day, and losing weight intentionally - lost 20 lbs. Cut out pasta, refined sugars, sodas.  Pt does not have chronic kidney disease, last BUN/creatinine was:  Lab Results  Component Value Date   BUN 18 04/19/2012   CREATININE 0.7 04/19/2012  she is on lisinopril 20 mg daily.  Last set of lipids: Lab Results  Component Value Date   CHOL 208* 04/19/2012   HDL 60.60 04/19/2012   LDLCALC 130* 02/18/2011   LDLDIRECT 127.3 04/19/2012   TRIG 60.0 04/19/2012   CHOLHDL 3 04/19/2012   Pt did not have a dilated eye exam >> will see Dr Alexandra [name] today. Denies numbness and tingling in her legs.  She also has a history of vitamin D deficiency, HTN, headaches, IDA - on iron therapy. She has a history of essential thrombocytopenia, JAK2 mutation negative, with platelets 700-800 >> worse lately. She will have a bone Bx repeated in 01/2013 - last in 2005.   I reviewed pt's medications, allergies, PMH, social hx, family hx and no changes required, except as mentioned above.  ROS: Constitutional: + weight loss, no fatigue, no subjective hyperthermia/hypothermia Eyes: no blurry vision, no xerophthalmia ENT: no sore throat, no nodules palpated in throat, no dysphagia/odynophagia, no hoarseness Cardiovascular: no CP/SOB/palpitations/leg swelling Respiratory: no cough/SOB Gastrointestinal: no N/V/D/C Musculoskeletal: no muscle/joint  aches Skin: no rashes Neurological: no tremors/numbness/tingling/dizziness  PE: BP 140/82  Pulse 80  Temp(Src) 98 F (36.7 C) (Oral)  Resp 10  Wt 149 lb 6.4 oz (67.767 kg)  BMI 22.05 kg/m2  SpO2 98% Wt Readings from Last 3 Encounters:  12/26/12 149 lb 6.4 oz (67.767 kg)  12/08/12 150 lb (68.04 kg)  10/27/12 152 lb (68.947 kg)   Constitutional: thin, in NAD Eyes: PERRLA, EOMI, no exophthalmos ENT: moist mucous membranes, no thyromegaly, no cervical lymphadenopathy Cardiovascular: RRR, No MRG Respiratory: CTA B Gastrointestinal: abdomen soft, NT, ND, BS+ Musculoskeletal: no deformities, strength intact in all 4 Skin: moist, warm, no rashes Neurological: no tremor with outstretched hands, DTR normal in all 4  ASSESSMENT: 1. DM2, non-insulin-dependent, controlled, without complications  PLAN:  1.  Pt with mild, fairly recent onset diabetes, on no medication. Her diabetes control is exemplary.  - at last visit, we discussed at length about a healthy diet; I gave her specific examples about healthy, nutritious, foods (please see patient's instructions). I advised her to avoid drinking the 8 ounce of orange juice in the morning, since this will greatly raise her sugars. We discussed about glycemic index of foods, and which foods to avoid or reduce in quantity. - she continues to exercise - continue to stay off diabetes medications as she is doing so well on diet - we will not check a Hba1c (likely not an accurate measurement) b/c of her anemia and increased platelets >> I will go by her sugars for now - RTC in 3 mo  with sugar log  .getlabs[3d

## 2013-01-05 ENCOUNTER — Other Ambulatory Visit (HOSPITAL_BASED_OUTPATIENT_CLINIC_OR_DEPARTMENT_OTHER): Payer: BC Managed Care – PPO

## 2013-01-05 ENCOUNTER — Encounter: Payer: Self-pay | Admitting: Internal Medicine

## 2013-01-05 DIAGNOSIS — D473 Essential (hemorrhagic) thrombocythemia: Secondary | ICD-10-CM

## 2013-01-05 LAB — CBC WITH DIFFERENTIAL/PLATELET
Basophils Absolute: 0.1 10*3/uL (ref 0.0–0.1)
EOS%: 1.6 % (ref 0.0–7.0)
HCT: 32.1 % — ABNORMAL LOW (ref 34.8–46.6)
HGB: 10.7 g/dL — ABNORMAL LOW (ref 11.6–15.9)
LYMPH%: 25.1 % (ref 14.0–49.7)
MCH: 25.6 pg (ref 25.1–34.0)
MCV: 77.2 fL — ABNORMAL LOW (ref 79.5–101.0)
NEUT%: 61.2 % (ref 38.4–76.8)
Platelets: 798 10*3/uL — ABNORMAL HIGH (ref 145–400)
lymph#: 1.6 10*3/uL (ref 0.9–3.3)

## 2013-01-09 LAB — TRANSFERRIN RECEPTOR, SOLUABLE: Transferrin Receptor, Soluble: 1.72 mg/L (ref 0.76–1.76)

## 2013-01-10 ENCOUNTER — Encounter (INDEPENDENT_AMBULATORY_CARE_PROVIDER_SITE_OTHER): Payer: BC Managed Care – PPO | Admitting: Ophthalmology

## 2013-01-10 DIAGNOSIS — H33309 Unspecified retinal break, unspecified eye: Secondary | ICD-10-CM

## 2013-01-11 ENCOUNTER — Telehealth: Payer: Self-pay | Admitting: Nurse Practitioner

## 2013-01-11 NOTE — Telephone Encounter (Signed)
Pt called requesting Plt results from 11/6. Results given pt excited and verbalized understanding and appreciation.

## 2013-01-13 ENCOUNTER — Encounter (INDEPENDENT_AMBULATORY_CARE_PROVIDER_SITE_OTHER): Payer: BC Managed Care – PPO | Admitting: Ophthalmology

## 2013-01-18 ENCOUNTER — Other Ambulatory Visit (HOSPITAL_BASED_OUTPATIENT_CLINIC_OR_DEPARTMENT_OTHER): Payer: BC Managed Care – PPO | Admitting: Lab

## 2013-01-18 DIAGNOSIS — D473 Essential (hemorrhagic) thrombocythemia: Secondary | ICD-10-CM

## 2013-01-18 LAB — CBC WITH DIFFERENTIAL/PLATELET
Basophils Absolute: 0 10*3/uL (ref 0.0–0.1)
Eosinophils Absolute: 0.2 10*3/uL (ref 0.0–0.5)
LYMPH%: 23.7 % (ref 14.0–49.7)
MCV: 78.6 fL — ABNORMAL LOW (ref 79.5–101.0)
MONO%: 11 % (ref 0.0–14.0)
NEUT#: 4.7 10*3/uL (ref 1.5–6.5)
Platelets: 933 10*3/uL — ABNORMAL HIGH (ref 145–400)
RBC: 4.37 10*6/uL (ref 3.70–5.45)

## 2013-01-19 ENCOUNTER — Other Ambulatory Visit: Payer: BC Managed Care – PPO

## 2013-01-23 ENCOUNTER — Telehealth: Payer: Self-pay | Admitting: Internal Medicine

## 2013-01-23 NOTE — Telephone Encounter (Signed)
Pt needs samples bystolic 20 mg.

## 2013-01-23 NOTE — Telephone Encounter (Signed)
Pt given Bystolic 10mg  tabs.  Instructed to take 2 to total 20mg .  She will pick up at the front desk.

## 2013-01-30 ENCOUNTER — Ambulatory Visit (INDEPENDENT_AMBULATORY_CARE_PROVIDER_SITE_OTHER): Payer: BC Managed Care – PPO | Admitting: Ophthalmology

## 2013-01-30 DIAGNOSIS — H33309 Unspecified retinal break, unspecified eye: Secondary | ICD-10-CM

## 2013-02-01 ENCOUNTER — Telehealth: Payer: Self-pay | Admitting: Hematology & Oncology

## 2013-02-01 ENCOUNTER — Other Ambulatory Visit (HOSPITAL_BASED_OUTPATIENT_CLINIC_OR_DEPARTMENT_OTHER): Payer: BC Managed Care – PPO | Admitting: Lab

## 2013-02-01 DIAGNOSIS — D473 Essential (hemorrhagic) thrombocythemia: Secondary | ICD-10-CM

## 2013-02-01 LAB — CBC WITH DIFFERENTIAL/PLATELET
BASO%: 1.2 % (ref 0.0–2.0)
EOS%: 1.7 % (ref 0.0–7.0)
LYMPH%: 21.4 % (ref 14.0–49.7)
MCHC: 31.7 g/dL (ref 31.5–36.0)
MCV: 79.8 fL (ref 79.5–101.0)
MONO%: 12.3 % (ref 0.0–14.0)
Platelets: 957 10*3/uL — ABNORMAL HIGH (ref 145–400)
RBC: 4.02 10*6/uL (ref 3.70–5.45)
WBC: 8.1 10*3/uL (ref 3.9–10.3)

## 2013-02-01 NOTE — Telephone Encounter (Signed)
pt walked in explaining her lab was incorrectly scheduled for 12/4 and needed to be on 12/3 so lab added on for today and tomorrows appt cancelled shh

## 2013-02-02 ENCOUNTER — Other Ambulatory Visit: Payer: BC Managed Care – PPO | Admitting: Lab

## 2013-02-05 ENCOUNTER — Other Ambulatory Visit: Payer: Self-pay | Admitting: Internal Medicine

## 2013-02-10 ENCOUNTER — Other Ambulatory Visit: Payer: Self-pay | Admitting: Hematology & Oncology

## 2013-02-15 ENCOUNTER — Other Ambulatory Visit (HOSPITAL_BASED_OUTPATIENT_CLINIC_OR_DEPARTMENT_OTHER): Payer: BC Managed Care – PPO

## 2013-02-15 DIAGNOSIS — D473 Essential (hemorrhagic) thrombocythemia: Secondary | ICD-10-CM

## 2013-02-15 LAB — CBC WITH DIFFERENTIAL/PLATELET
BASO%: 1 % (ref 0.0–2.0)
LYMPH%: 26.4 % (ref 14.0–49.7)
MCHC: 33 g/dL (ref 31.5–36.0)
MONO#: 0.7 10*3/uL (ref 0.1–0.9)
RBC: 4.3 10*6/uL (ref 3.70–5.45)
WBC: 6.8 10*3/uL (ref 3.9–10.3)
lymph#: 1.8 10*3/uL (ref 0.9–3.3)

## 2013-02-16 ENCOUNTER — Other Ambulatory Visit: Payer: BC Managed Care – PPO

## 2013-02-17 ENCOUNTER — Other Ambulatory Visit: Payer: Self-pay | Admitting: Internal Medicine

## 2013-02-17 NOTE — Telephone Encounter (Signed)
No recent lab work ordered by you.   Please advise.  Thanks!

## 2013-02-20 NOTE — Telephone Encounter (Signed)
  Refill medication for 6 months .  Would be due for labs if not done elsewhere by then.  Last bmp was 2 14

## 2013-02-24 ENCOUNTER — Encounter (HOSPITAL_COMMUNITY): Payer: Self-pay | Admitting: Pharmacy Technician

## 2013-02-28 ENCOUNTER — Ambulatory Visit (HOSPITAL_BASED_OUTPATIENT_CLINIC_OR_DEPARTMENT_OTHER): Payer: BC Managed Care – PPO | Admitting: Hematology & Oncology

## 2013-02-28 ENCOUNTER — Ambulatory Visit (HOSPITAL_COMMUNITY)
Admission: RE | Admit: 2013-02-28 | Discharge: 2013-02-28 | Disposition: A | Discharge status: Home / Self Care | Payer: BC Managed Care – PPO | Source: Ambulatory Visit | Attending: Hematology & Oncology | Admitting: Hematology & Oncology

## 2013-02-28 ENCOUNTER — Encounter (HOSPITAL_COMMUNITY): Payer: Self-pay

## 2013-02-28 VITALS — BP 121/78 | HR 89 | Temp 97.6°F | Resp 16 | Ht 71.0 in | Wt 142.0 lb

## 2013-02-28 DIAGNOSIS — D474 Osteomyelofibrosis: Secondary | ICD-10-CM | POA: Insufficient documentation

## 2013-02-28 DIAGNOSIS — D471 Chronic myeloproliferative disease: Secondary | ICD-10-CM | POA: Insufficient documentation

## 2013-02-28 DIAGNOSIS — D649 Anemia, unspecified: Secondary | ICD-10-CM | POA: Insufficient documentation

## 2013-02-28 DIAGNOSIS — D473 Essential (hemorrhagic) thrombocythemia: Secondary | ICD-10-CM | POA: Insufficient documentation

## 2013-02-28 HISTORY — DX: Type 2 diabetes mellitus without complications: E11.9

## 2013-02-28 LAB — CBC WITH DIFFERENTIAL/PLATELET
Basophils Absolute: 0 10*3/uL (ref 0.0–0.1)
Basophils Relative: 0 % (ref 0–1)
Eosinophils Relative: 1 % (ref 0–5)
HCT: 33.8 % — ABNORMAL LOW (ref 36.0–46.0)
Hemoglobin: 10.5 g/dL — ABNORMAL LOW (ref 12.0–15.0)
MCHC: 31.1 g/dL (ref 30.0–36.0)
MCV: 80.1 fL (ref 78.0–100.0)
Monocytes Absolute: 1.1 10*3/uL — ABNORMAL HIGH (ref 0.1–1.0)
Monocytes Relative: 11 % (ref 3–12)
RDW: 17.9 % — ABNORMAL HIGH (ref 11.5–15.5)

## 2013-02-28 LAB — GLUCOSE, CAPILLARY: Glucose-Capillary: 128 mg/dL — ABNORMAL HIGH (ref 70–99)

## 2013-02-28 MED ORDER — MIDAZOLAM HCL 5 MG/5ML IJ SOLN
INTRAMUSCULAR | Status: AC | PRN
  Administered 2013-02-28 (×2): 1 mg via INTRAVENOUS
  Administered 2013-02-28: 2.5 mg via INTRAVENOUS

## 2013-02-28 MED ORDER — MEPERIDINE HCL 50 MG/ML IJ SOLN
50.0000 mg | Freq: Once | INTRAMUSCULAR | Status: DC
  Filled 2013-02-28: qty 1

## 2013-02-28 MED ORDER — SODIUM CHLORIDE 0.9 % IV SOLN
INTRAVENOUS | Status: DC
  Administered 2013-02-28: 20 mL/h via INTRAVENOUS

## 2013-02-28 MED ORDER — MEPERIDINE HCL 25 MG/ML IJ SOLN
INTRAMUSCULAR | Status: AC | PRN
  Administered 2013-02-28: 12.5 mg via INTRAVENOUS
  Administered 2013-02-28: 25 mg via INTRAVENOUS

## 2013-02-28 MED ORDER — MIDAZOLAM HCL 10 MG/2ML IJ SOLN
5.0000 mg | Freq: Once | INTRAMUSCULAR | Status: DC
  Filled 2013-02-28: qty 2

## 2013-02-28 NOTE — Progress Notes (Signed)
CRITICAL VALUE ALERT  Critical value received:  Platelet Count 1121  Date of notification:  02/28/2013  Time of notification:  0742  Critical value read back:yes  Nurse who received alert:  Ramond Craver, RN  MD notified (1st page):  Dr. Myna Hidalgo   Time of first page:  0745  MD notified (2nd page):  Time of second page:  Responding MD:  Dr. Myna Hidalgo   Time MD responded:  [phone]

## 2013-02-28 NOTE — Progress Notes (Signed)
This is a bone marrow bx and aspirate procedure note for Alexandra [name].  She had this done at the [facility] unit.   Her Mallimpati score is 1.  Her ASA class is 1.  An IV was placed peripherally in right arm.    Timeout procedure was done appropriately.  She was placed on her right side. She received 4mg  of Versed and 37.5mg  of Demerol for sedation.  The left PIC region was prepped and draped in sterile fashion.  8cc of 2% lidocaine infiltrated under skin into the periosteum.  A #11 scapel was used to make an incision into the skin.  Despite 4 attempts, no aspirate could be obtained.  A 2nd incision was made with a #11 scapel.  An excellent bx was obtained.  We dressed the procedure site sterilely.  She tolerated the procedure well with no complications.  [name]

## 2013-03-03 ENCOUNTER — Other Ambulatory Visit: Payer: BC Managed Care – PPO

## 2013-03-15 ENCOUNTER — Other Ambulatory Visit (HOSPITAL_BASED_OUTPATIENT_CLINIC_OR_DEPARTMENT_OTHER): Payer: BC Managed Care – PPO | Admitting: Lab

## 2013-03-15 ENCOUNTER — Ambulatory Visit (HOSPITAL_BASED_OUTPATIENT_CLINIC_OR_DEPARTMENT_OTHER): Payer: BC Managed Care – PPO | Admitting: Hematology & Oncology

## 2013-03-15 ENCOUNTER — Encounter: Payer: Self-pay | Admitting: Hematology & Oncology

## 2013-03-15 VITALS — BP 132/80 | HR 96 | Temp 98.3°F | Resp 14 | Ht 70.0 in | Wt 148.0 lb

## 2013-03-15 DIAGNOSIS — D473 Essential (hemorrhagic) thrombocythemia: Secondary | ICD-10-CM

## 2013-03-15 DIAGNOSIS — D509 Iron deficiency anemia, unspecified: Secondary | ICD-10-CM | POA: Insufficient documentation

## 2013-03-15 HISTORY — DX: Iron deficiency anemia, unspecified: D50.9

## 2013-03-15 LAB — CHCC SATELLITE - SMEAR

## 2013-03-15 LAB — CMP (CANCER CENTER ONLY)
ALT(SGPT): 19 U/L (ref 10–47)
AST: 18 U/L (ref 11–38)
Albumin: 3.5 g/dL (ref 3.3–5.5)
Alkaline Phosphatase: 80 U/L (ref 26–84)
BUN: 20 mg/dL (ref 7–22)
CALCIUM: 9 mg/dL (ref 8.0–10.3)
CHLORIDE: 105 meq/L (ref 98–108)
CO2: 33 meq/L (ref 18–33)
CREATININE: 0.8 mg/dL (ref 0.6–1.2)
Glucose, Bld: 122 mg/dL — ABNORMAL HIGH (ref 73–118)
Potassium: 3.5 mEq/L (ref 3.3–4.7)
Sodium: 143 mEq/L (ref 128–145)
Total Bilirubin: 0.7 mg/dl (ref 0.20–1.60)
Total Protein: 7.6 g/dL (ref 6.4–8.1)

## 2013-03-15 LAB — CBC WITH DIFFERENTIAL (CANCER CENTER ONLY)
BASO#: 0 10*3/uL (ref 0.0–0.2)
BASO%: 0.3 % (ref 0.0–2.0)
EOS%: 1.2 % (ref 0.0–7.0)
Eosinophils Absolute: 0.1 10*3/uL (ref 0.0–0.5)
HCT: 32.2 % — ABNORMAL LOW (ref 34.8–46.6)
HGB: 10 g/dL — ABNORMAL LOW (ref 11.6–15.9)
LYMPH#: 1.8 10*3/uL (ref 0.9–3.3)
LYMPH%: 25.4 % (ref 14.0–48.0)
MCH: 24.9 pg — ABNORMAL LOW (ref 26.0–34.0)
MCHC: 31.1 g/dL — ABNORMAL LOW (ref 32.0–36.0)
MCV: 80 fL — AB (ref 81–101)
MONO#: 0.7 10*3/uL (ref 0.1–0.9)
MONO%: 10.6 % (ref 0.0–13.0)
NEUT#: 4.3 10*3/uL (ref 1.5–6.5)
NEUT%: 62.5 % (ref 39.6–80.0)
PLATELETS: 599 10*3/uL — AB (ref 145–400)
RBC: 4.02 10*6/uL (ref 3.70–5.32)
RDW: 17.2 % — ABNORMAL HIGH (ref 11.1–15.7)
WBC: 6.9 10*3/uL (ref 3.9–10.0)

## 2013-03-15 LAB — TECHNOLOGIST REVIEW CHCC SATELLITE

## 2013-03-15 NOTE — Progress Notes (Signed)
This office note has been dictated.

## 2013-03-16 ENCOUNTER — Telehealth: Payer: Self-pay | Admitting: Internal Medicine

## 2013-03-16 ENCOUNTER — Telehealth: Payer: Self-pay | Admitting: Hematology & Oncology

## 2013-03-16 ENCOUNTER — Other Ambulatory Visit: Payer: Self-pay | Admitting: Lab

## 2013-03-16 DIAGNOSIS — D509 Iron deficiency anemia, unspecified: Secondary | ICD-10-CM

## 2013-03-16 DIAGNOSIS — D473 Essential (hemorrhagic) thrombocythemia: Secondary | ICD-10-CM

## 2013-03-16 NOTE — Telephone Encounter (Signed)
Pt needs samples of bystolic 20 mg

## 2013-03-16 NOTE — Progress Notes (Signed)
CC:   Alexandra [name]. Panosh, MD  DIAGNOSIS:  Essential thrombocythemia-JAK2 negative.  CURRENT THERAPY: 1. Anagrelide 4 mg p.o. daily. 2. Aspirin 81 mg p.o. daily.  INTERIM HISTORY:  Alexandra [name] comes in for her followup.  We did go ahead and do a bone marrow biopsy on her.  Her platelet count was continued to go up.  Her last marrow was done about 9 years ago.  This was done on December 30th.  We got an excellent specimen. I could not aspirate out any liquid, however.  The bone marrow report ([place] [id number]) showed a hypercellular marrow with atypical megakaryocytic proliferation.  She had basically the same degree of changes are seen in her bone marrow 9 years ago.  She had maybe a slight focal increase of fibrosis.  No leukemic cells were appreciated.  She had markedly increased  megakaryocytes and that were atypical.  There was, I think some clustering.  Cytogenetics, that were done were normal.  She is doing okay.  She has had no problems with the anagrelide.  She is just worried about, the plan is still being so high.  She has had no pain in her hands or feet.  She has had no cough.  She has had no change in bowel or bladder habits.  There has been no rashes. She has had no weight loss or weight gain.  PHYSICAL EXAMINATION:  General:  This is a well-developed, well- nourished [name], in no obvious distress.  Vital Signs: Temperature 98.3, pulse 64, respiratory rate 18, blood pressure is 133/80.  Weight is 148 pounds.  Head and Neck:  Normocephalic, atraumatic skull.  There are no ocular or oral lesions.  She has no palpable cervical or supraclavicular lymph nodes.  Lungs:  Clear bilaterally.  Cardiac:  Regular rate and rhythm with a normal S1, S2. There are no murmurs, rubs, or bruits.  Abdomen:  Soft.  She has good bowel sounds.  There is no fluid wave.  There is no palpable abdominal mass.  There is no palpable hepatosplenomegaly.  Back:  No tenderness over the  spine, ribs, or hips.  Extremities:  Show no clubbing, cyanosis, or edema.  Skin:  No erythema on the hands or feet.  She has no ecchymoses or petechia.  Neurological:  Shows no focal neurological deficits.  LABORATORY STUDIES:  White cell count is 6.9, hemoglobin 10, hematocrit 32.2, platelet count 599.  MCV is 80.  Peripheral smear shows some mild anisocytosis and poikilocytosis.  I do not see any obvious nucleated red blood cells.  She has a few teardrop cells.  There is no target cells.  I see schistocytes and spherocytes. White cells appear normal in morphology and maturation.  She has no immature myeloid or lymphoid forms.  There is no hypersegmented polys. She has no blasts.  Platelets are increased in number.  She has several large platelets.  Platelets appear well granulated.  IMPRESSION:  Alexandra [name] is a 63 year old African American female.  She has essential thrombocythemia.  Again, she is JAK2 negative.  I am going to repeat the JAK2 assay, when we see her back.  I am sending off a calreticulin assay.  This can be positive in patients with myeloproliferative process who are JAK2 negative.  More I think with this bone marrow finding that there would be an abnormality that is driving this.  I am still wondering about her iron levels.  We really cannot check these with the bone marrow as  we cannot get her back any aspirate.  I do think with her low MCV,  that she could be iron deficient.  This is certainly a complex period.  I told her to stop the anagrelide right now.  I am going to give her a dose of IV iron.  I want to see if this can help her.  If the IV iron does not bring the platelet count down more, then I think we need to see about adding Hydrea back.  This certainly could be worthwhile.  I think if she is calreticulin positive, then we might be able to consider the JAK2 inhibitor.  I also talked about the possibility of using interferon.  I spent a good 45  minutes with her today.  Again, this is a very complex situation.  Again, we have been dealing with this now for 10 years and she has done exceedingly well.  She has had no complications from this.  Alexandra [name] will continue to have her blood work checked every 2 weeks.  I will plan to see her back in a month, or sooner.    ______________________________ [name], M.D. PRE/MEDQ  D:  03/15/2013  T:  03/16/2013  Job:  [id number]

## 2013-03-16 NOTE — Telephone Encounter (Signed)
Left message to call and schedule appointments.  °

## 2013-03-17 NOTE — Telephone Encounter (Signed)
Left message on personally identified cell informing the pt that I will put samples at the front desk.   Sent 10mg  tabs and left instructions for her to take 2 tablets at a time.

## 2013-03-19 LAB — CALRETICULIN (CALR) MUTATION ANALYSIS: CALR Mutation (exon 9): DETECTED — AB

## 2013-03-19 LAB — TRANSFERRIN RECEPTOR, SOLUABLE: Transferrin Receptor, Soluble: 1.51 mg/L (ref 0.76–1.76)

## 2013-03-20 ENCOUNTER — Encounter: Payer: Self-pay | Admitting: Hematology & Oncology

## 2013-03-20 ENCOUNTER — Telehealth: Payer: Self-pay | Admitting: Hematology & Oncology

## 2013-03-20 ENCOUNTER — Ambulatory Visit: Payer: BC Managed Care – PPO

## 2013-03-20 NOTE — Telephone Encounter (Signed)
Pt cx 1-19 iron said would call back to reschedule. Left message for RN

## 2013-03-21 LAB — CHROMOSOME ANALYSIS, BONE MARROW

## 2013-03-21 LAB — TISSUE HYBRIDIZATION (BONE MARROW)-NCBH

## 2013-03-23 ENCOUNTER — Encounter: Payer: Self-pay | Admitting: Hematology & Oncology

## 2013-03-27 ENCOUNTER — Other Ambulatory Visit: Payer: Self-pay | Admitting: Hematology & Oncology

## 2013-03-29 ENCOUNTER — Other Ambulatory Visit (HOSPITAL_BASED_OUTPATIENT_CLINIC_OR_DEPARTMENT_OTHER): Payer: BC Managed Care – PPO

## 2013-03-29 DIAGNOSIS — D473 Essential (hemorrhagic) thrombocythemia: Secondary | ICD-10-CM

## 2013-03-29 LAB — CBC WITH DIFFERENTIAL/PLATELET
BASO%: 1.4 % (ref 0.0–2.0)
Basophils Absolute: 0.1 10*3/uL (ref 0.0–0.1)
EOS ABS: 0.1 10*3/uL (ref 0.0–0.5)
EOS%: 1.1 % (ref 0.0–7.0)
HCT: 32.9 % — ABNORMAL LOW (ref 34.8–46.6)
HGB: 10.7 g/dL — ABNORMAL LOW (ref 11.6–15.9)
LYMPH%: 20.8 % (ref 14.0–49.7)
MCH: 25.6 pg (ref 25.1–34.0)
MCHC: 32.6 g/dL (ref 31.5–36.0)
MCV: 78.6 fL — ABNORMAL LOW (ref 79.5–101.0)
MONO#: 0.8 10*3/uL (ref 0.1–0.9)
MONO%: 10.5 % (ref 0.0–14.0)
NEUT%: 66.2 % (ref 38.4–76.8)
NEUTROS ABS: 5.2 10*3/uL (ref 1.5–6.5)
Platelets: 830 10*3/uL — ABNORMAL HIGH (ref 145–400)
RBC: 4.18 10*6/uL (ref 3.70–5.45)
RDW: 17.1 % — ABNORMAL HIGH (ref 11.2–14.5)
WBC: 7.9 10*3/uL (ref 3.9–10.3)
lymph#: 1.6 10*3/uL (ref 0.9–3.3)

## 2013-03-30 ENCOUNTER — Ambulatory Visit (INDEPENDENT_AMBULATORY_CARE_PROVIDER_SITE_OTHER): Payer: BC Managed Care – PPO | Admitting: Internal Medicine

## 2013-03-30 ENCOUNTER — Encounter: Payer: Self-pay | Admitting: Internal Medicine

## 2013-03-30 VITALS — BP 142/84 | HR 86 | Temp 98.3°F | Resp 12 | Wt 148.9 lb

## 2013-03-30 DIAGNOSIS — E119 Type 2 diabetes mellitus without complications: Secondary | ICD-10-CM

## 2013-03-30 NOTE — Progress Notes (Signed)
Patient ID: Alexandra [name], female   DOB: [birth year], 63 y.o.   MRN: [id number]  HPI: Alexandra [name] is a 63 y.o.-year-old AA woman, returning fot f/u for DM2, dx 12/2011, non-insulin-dependent, diet-controlled, without complications. Last visit 3 mo ago.  She is seeing Dr Marin Olp for her Essential Tcpenia >> higher levels lately, now back to baseline. She is on Anagrelide for a year. BM Bx does not show Leukemia. She also ahs anemia and was started on iron - takes it with food >> not responding well >> was recommended IV iron >> she is still thinking about this.  We have not been checking HbA1c because of the the above issues, which would influence the result.  Last hemoglobin A1c was: Lab Results  Component Value Date   HGBA1C 7.1* 08/24/2012   HGBA1C 6.9* 04/19/2012   HGBA1C 6.8* 01/04/2012    Pt is not on medication for her diabetes.  Pt checks sugars at different times of the day, before and after meals >> 80-139 at last visit >> 85-139 now.   She is exercising by walking (30-45 min a day) but not 3x per week as before since cold and dark outside. Her weight stabilized.  Pt does not have chronic kidney disease, last BUN/creatinine was:  Lab Results  Component Value Date   BUN 20 03/15/2013   CREATININE 0.8 03/15/2013  she is on lisinopril 20 mg daily.  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 Had Laser Sx in the L eye at the end of 2014. Denies numbness and tingling in her legs.  She also has a history of vitamin D deficiency, HTN, headaches, IDA - on iron therapy. 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   I reviewed pt's medications, allergies, PMH, social hx,  family hx and no changes required, except as mentioned above.  ROS: Constitutional: + weight loss, no fatigue, no subjective hyperthermia/hypothermia Eyes: no blurry vision, no xerophthalmia ENT: no sore throat, no nodules palpated in throat, no dysphagia/odynophagia, no hoarseness Cardiovascular: no CP/SOB/palpitations/leg swelling Respiratory: no cough/SOB Gastrointestinal: no N/V/D/C Musculoskeletal: no muscle/joint aches Skin: no rashes Neurological: no tremors/numbness/tingling/dizziness, + HA  PE: BP 142/84  Pulse 86  Temp(Src) 98.3 F (36.8 C) (Oral)  Resp 12  Wt 148 lb 14.4 oz (67.541 kg)  SpO2 98% Wt Readings from Last 3 Encounters:  03/30/13 148 lb 14.4 oz (67.541 kg)  03/15/13 148 lb (67.132 kg)  02/28/13 142 lb (64.411 kg)  Body mass index is 21.37 kg/(m^2).  Constitutional: thin, in NAD Eyes: PERRLA, EOMI, no exophthalmos ENT: moist mucous membranes, no thyromegaly, no cervical lymphadenopathy Cardiovascular: RRR, No MRG Respiratory: CTA B Gastrointestinal: abdomen soft, NT, ND, BS+ Musculoskeletal: no deformities, strength intact in all 4 Skin: moist, warm, no rashes  ASSESSMENT: 1. DM2, diet-controlled, without complications  PLAN:  1.  Pt with mild, fairly recent onset diabetes, on no medication. Her diabetes control is very good.  - at last visits, we discussed at length about a healthy diet; I gave her specific examples about healthy, nutritious, foods. We discussed about glycemic index of foods, and which foods to avoid or reduce  in quantity. She is doing a great job with this. - she continues to exercise but not as much now during the winter - continue to stay off diabetes medications as she is doing so well on diet - we will not check a Hba1c (likely not an accurate measurement) b/c of her anemia and increased platelets >> I will go by her sugars for now - RTC in 4 mo with sugar log

## 2013-03-30 NOTE — Patient Instructions (Signed)
Please return 4 months.

## 2013-04-01 ENCOUNTER — Other Ambulatory Visit: Payer: Self-pay | Admitting: Internal Medicine

## 2013-04-04 NOTE — Telephone Encounter (Signed)
Looks like the patient is due for lab work this month.  Please review and advise on refills.  Thanks!

## 2013-04-04 NOTE — Telephone Encounter (Signed)
Tell patient she is due for  Bmp tests Dx ht please order this.  Refill med  In the menatime  And if ok then can   Refill enough until summer  When due for yearly check up.

## 2013-04-05 ENCOUNTER — Other Ambulatory Visit: Payer: Self-pay | Admitting: Family Medicine

## 2013-04-05 ENCOUNTER — Telehealth: Payer: Self-pay | Admitting: Family Medicine

## 2013-04-05 DIAGNOSIS — I10 Essential (primary) hypertension: Secondary | ICD-10-CM

## 2013-04-05 NOTE — Telephone Encounter (Signed)
Pt needs BMP.  Please call the pt and make lab appt.  I have placed the order in the system.  Thanks!

## 2013-04-05 NOTE — Telephone Encounter (Signed)
lmom for pt to call back

## 2013-04-06 NOTE — Telephone Encounter (Signed)
lmom for pt to call back

## 2013-04-11 NOTE — Telephone Encounter (Signed)
Pt stated she will cb

## 2013-04-12 ENCOUNTER — Other Ambulatory Visit: Payer: Self-pay | Admitting: Hematology & Oncology

## 2013-04-12 ENCOUNTER — Ambulatory Visit (HOSPITAL_BASED_OUTPATIENT_CLINIC_OR_DEPARTMENT_OTHER): Payer: BC Managed Care – PPO | Admitting: Hematology & Oncology

## 2013-04-12 ENCOUNTER — Encounter: Payer: Self-pay | Admitting: Hematology & Oncology

## 2013-04-12 ENCOUNTER — Other Ambulatory Visit: Payer: BC Managed Care – PPO | Admitting: Lab

## 2013-04-12 VITALS — BP 149/88 | HR 82 | Temp 98.0°F | Resp 14 | Ht 71.0 in | Wt 147.0 lb

## 2013-04-12 DIAGNOSIS — D473 Essential (hemorrhagic) thrombocythemia: Secondary | ICD-10-CM

## 2013-04-12 DIAGNOSIS — D509 Iron deficiency anemia, unspecified: Secondary | ICD-10-CM

## 2013-04-12 LAB — CBC WITH DIFFERENTIAL (CANCER CENTER ONLY)
BASO#: 0.1 10*3/uL (ref 0.0–0.2)
BASO%: 1 % (ref 0.0–2.0)
EOS ABS: 0.2 10*3/uL (ref 0.0–0.5)
EOS%: 2.1 % (ref 0.0–7.0)
HEMATOCRIT: 34.2 % — AB (ref 34.8–46.6)
HGB: 10.7 g/dL — ABNORMAL LOW (ref 11.6–15.9)
LYMPH#: 1.8 10*3/uL (ref 0.9–3.3)
LYMPH%: 25.4 % (ref 14.0–48.0)
MCH: 25.2 pg — ABNORMAL LOW (ref 26.0–34.0)
MCHC: 31.3 g/dL — ABNORMAL LOW (ref 32.0–36.0)
MCV: 81 fL (ref 81–101)
MONO#: 0.7 10*3/uL (ref 0.1–0.9)
MONO%: 10.1 % (ref 0.0–13.0)
NEUT#: 4.4 10*3/uL (ref 1.5–6.5)
NEUT%: 61.4 % (ref 39.6–80.0)
RBC: 4.25 10*6/uL (ref 3.70–5.32)
RDW: 17.7 % — ABNORMAL HIGH (ref 11.1–15.7)
WBC: 7.1 10*3/uL (ref 3.9–10.0)

## 2013-04-12 LAB — TECHNOLOGIST REVIEW CHCC SATELLITE

## 2013-04-12 LAB — CHCC SATELLITE - SMEAR

## 2013-04-12 NOTE — Progress Notes (Signed)
Hematology and Oncology Follow Up Visit  Alexandra [name] [id number] [birth year] 63 y.o. 04/12/2013   Principle Diagnosis:   Essential thrombocythemia-Calreticulin positive  Current Therapy:    Anagrelide 4 mg by mouth daily     Interim History:  Alexandra [name] is in for followup. Alexandra [name] is on anagrelide. Alexandra [name] is on 4 mg a day. Alexandra [name]'s doing well with this.  There's been no fatigue or weakness. Alexandra [name]'s had no headache. Stat no leg swelling. There's been no rashes. Alexandra [name]'s had no nausea vomiting. Alexandra [name]'s had no burning in Alexandra [name] hands or feet.  Alexandra [name] is JAK2 t negative but Calreticulin positive.  Medications: Current outpatient prescriptions:amLODipine (NORVASC) 5 MG tablet, Take 5 mg by mouth daily with breakfast., Disp: , Rfl: ;  anagrelide (AGRYLIN) 1 MG capsule, Take 1 mg by mouth daily. TAKE 4 TABS DAILY  TOTAL OF 4 MG DAILY, Disp: , Rfl: ;  anagrelide (AGRYLIN) 1 MG capsule, TAKE 4 CAPSULES BY MOUTH DAILY, Disp: 120 capsule, Rfl: 0;  aspirin 81 MG tablet, Take 81 mg by mouth daily., Disp: , Rfl:  Blood Glucose Monitoring Suppl (ONE TOUCH ULTRA SYSTEM KIT) W/DEVICE KIT, 1 kit daily. Pt is Pre-Diabetic, Disp: , Rfl: ;  Fe Fum-FePoly-Vit C-Vit B3 (INTEGRA) 62.5-62.5-40-3 MG CAPS, Take 1 tablet by mouth daily., Disp: , Rfl: ;  lisinopril (PRINIVIL,ZESTRIL) 20 MG tablet, Take 40 mg by mouth daily with breakfast., Disp: , Rfl: ;  lisinopril (PRINIVIL,ZESTRIL) 20 MG tablet, TAKE TWO TABLETS BY MOUTH EVERY DAY, Disp: 60 tablet, Rfl: 0 Multiple Vitamin (MULTI-VITAMIN DAILY PO), Take 1 tablet by mouth every morning. , Disp: , Rfl: ;  Nebivolol HCl (BYSTOLIC) 20 MG TABS, Take 20 mg by mouth daily with breakfast., Disp: , Rfl: ;  ONE TOUCH LANCETS MISC, Check sugars 1x a day, Disp: 200 each, Rfl: 2;  ONE TOUCH ULTRA TEST test strip, 1 each by Other route as needed. , Disp: , Rfl:   Allergies: No Known Allergies  Past Medical History, Surgical history, Social history, and Family History were reviewed and  updated.  Review of Systems: As above  Physical Exam:  Well-developed well-nourished white female no obvious distress. Vital signs Alexandra [name] temperature of 98 pulse 82 respiratory 14 blood pressure 149/88. Weight is 147 pounds. Head and neck exam shows knocker oral lesions. There is no palpable cervical or supraclavicular lymph nodes. Lungs are clear. Cardiac exam regular rate rhythm with no murmurs rubs or bruits. Abdomen is soft. Alexandra [name] has good bowel sounds. There is no palpable liver or spleen tip. Extremities shows no clubbing cyanosis or edema. Alexandra [name]'s the range of motion of Alexandra [name] joints. There is no rashes. There is no ecchymosis or petechiae. Neurological exam no focal neurological deficits.  Lab Results  Component Value Date   WBC 7.1 04/12/2013   HGB 10.7* 04/12/2013   HCT 34.2* 04/12/2013   MCV 81 04/12/2013   PLT 1,183* 04/12/2013     Chemistry      Component Value Date/Time   NA 143 03/15/2013 1529   NA 141 04/19/2012 0838   K 3.5 03/15/2013 1529   K 3.8 04/19/2012 0838   CL 105 03/15/2013 1529   CL 108 04/19/2012 0838   CO2 33 03/15/2013 1529   CO2 27 04/19/2012 0838   BUN 20 03/15/2013 1529   BUN 18 04/19/2012 0838   CREATININE 0.8 03/15/2013 1529   CREATININE 0.7 04/19/2012 0838      Component Value Date/Time   CALCIUM 9.0 03/15/2013 1529   CALCIUM 8.8  04/19/2012 0838   ALKPHOS 80 03/15/2013 1529   ALKPHOS 105 04/19/2012 0838   AST 18 03/15/2013 1529   AST 17 04/19/2012 0838   ALT 19 03/15/2013 1529   ALT 18 04/19/2012 0838   BILITOT 0.70 03/15/2013 1529   BILITOT 0.7 04/19/2012 0838     Are blood smear, Alexandra [name] platelets are increased in number. Alexandra [name] has numerous large platelets. There is no nucleated red cells. There is no immature myeloid cells. Place oral cremated.    Impression and Plan: Alexandra [name] is a 63 year old African American female. Alexandra [name] has essential thrombocythemia. We did do a bone marrow biopsy on Alexandra [name] recently. 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 As compared to Alexandra [name] bone marrow 10  years ago, everything looked pretty stable.  Alexandra [name] cytogenetics were negative for chromosomal abnormalities.  I think we are going to have to consider interferon. I think this is going to be the next step. I talked to Alexandra [name] at length about this. We could consider using pegylated interferon.  I think there might be a clinical trial at [facility]. I will speak with one of my colleagues there.  I will increase Alexandra [name] anagrelide dose to 6 mg.  Alexandra [name] has Alexandra [name] blood work done every 2 weeks.  I'll probably due back to see me in a month, or sooner.     [name], MD 2/11/20159:46 AM

## 2013-04-15 LAB — HEMOGLOBINOPATHY EVALUATION
HGB S QUANTITAION: 0 %
Hemoglobin Other: 0 %
Hgb A2 Quant: 2.3 % (ref 2.2–3.2)
Hgb A: 97.7 % (ref 96.8–97.8)
Hgb F Quant: 0 % (ref 0.0–2.0)

## 2013-04-15 LAB — TRANSFERRIN RECEPTOR, SOLUABLE: Transferrin Receptor, Soluble: 1.71 mg/L (ref 0.76–1.76)

## 2013-04-16 ENCOUNTER — Other Ambulatory Visit: Payer: Self-pay | Admitting: Hematology & Oncology

## 2013-04-18 ENCOUNTER — Telehealth: Payer: Self-pay | Admitting: Nurse Practitioner

## 2013-04-18 NOTE — Telephone Encounter (Addendum)
Message copied by Jimmy Footman on Tue Apr 18, 2013 11:37 AM ------      Message from: Burney Gauze R      Created: Mon Apr 17, 2013  8:33 PM       Call - iron is ok!!  Laurey Arrow ------LVM on pt's personal machine and encouraged her if she had any further questions to contact the office.

## 2013-04-24 ENCOUNTER — Other Ambulatory Visit: Payer: Self-pay | Admitting: Nurse Practitioner

## 2013-04-24 MED ORDER — ANAGRELIDE HCL 1 MG PO CAPS: ORAL_CAPSULE | ORAL | Status: DC

## 2013-04-26 ENCOUNTER — Other Ambulatory Visit (HOSPITAL_BASED_OUTPATIENT_CLINIC_OR_DEPARTMENT_OTHER): Payer: BC Managed Care – PPO

## 2013-04-26 DIAGNOSIS — D473 Essential (hemorrhagic) thrombocythemia: Secondary | ICD-10-CM

## 2013-04-26 LAB — CBC WITH DIFFERENTIAL/PLATELET
BASO%: 1.5 % (ref 0.0–2.0)
Basophils Absolute: 0.1 10*3/uL (ref 0.0–0.1)
EOS%: 1.5 % (ref 0.0–7.0)
Eosinophils Absolute: 0.1 10*3/uL (ref 0.0–0.5)
HEMATOCRIT: 34.1 % — AB (ref 34.8–46.6)
HGB: 11 g/dL — ABNORMAL LOW (ref 11.6–15.9)
LYMPH#: 1.6 10*3/uL (ref 0.9–3.3)
LYMPH%: 20.6 % (ref 14.0–49.7)
MCH: 25.3 pg (ref 25.1–34.0)
MCHC: 32.3 g/dL (ref 31.5–36.0)
MCV: 78.3 fL — ABNORMAL LOW (ref 79.5–101.0)
MONO#: 0.9 10*3/uL (ref 0.1–0.9)
MONO%: 11.8 % (ref 0.0–14.0)
NEUT#: 5.1 10*3/uL (ref 1.5–6.5)
NEUT%: 64.6 % (ref 38.4–76.8)
Platelets: 608 10*3/uL — ABNORMAL HIGH (ref 145–400)
RBC: 4.36 10*6/uL (ref 3.70–5.45)
RDW: 17.5 % — ABNORMAL HIGH (ref 11.2–14.5)
WBC: 7.9 10*3/uL (ref 3.9–10.3)

## 2013-04-28 ENCOUNTER — Telehealth: Payer: Self-pay | Admitting: Nurse Practitioner

## 2013-04-28 NOTE — Telephone Encounter (Addendum)
Message copied by Jimmy Footman on Fri Apr 28, 2013 11:37 AM ------      Message from: Burney Gauze R      Created: Thu Apr 27, 2013 12:21 PM       Call - platelet count is much better -600K!!!  Pete ------LVM on pt's personal machine and instructed to contact us with any further questions or concerns.

## 2013-05-08 ENCOUNTER — Encounter: Payer: Self-pay | Admitting: Hematology & Oncology

## 2013-05-08 ENCOUNTER — Other Ambulatory Visit (HOSPITAL_BASED_OUTPATIENT_CLINIC_OR_DEPARTMENT_OTHER): Payer: BC Managed Care – PPO | Admitting: Lab

## 2013-05-08 ENCOUNTER — Ambulatory Visit (HOSPITAL_BASED_OUTPATIENT_CLINIC_OR_DEPARTMENT_OTHER): Payer: BC Managed Care – PPO | Admitting: Hematology & Oncology

## 2013-05-08 VITALS — BP 135/78 | HR 87 | Temp 98.2°F | Resp 14 | Ht 71.0 in | Wt 150.0 lb

## 2013-05-08 DIAGNOSIS — D473 Essential (hemorrhagic) thrombocythemia: Secondary | ICD-10-CM

## 2013-05-08 LAB — CBC WITH DIFFERENTIAL (CANCER CENTER ONLY)
BASO#: 0.1 10*3/uL (ref 0.0–0.2)
BASO%: 0.7 % (ref 0.0–2.0)
EOS%: 2.1 % (ref 0.0–7.0)
Eosinophils Absolute: 0.1 10*3/uL (ref 0.0–0.5)
HCT: 33 % — ABNORMAL LOW (ref 34.8–46.6)
HGB: 10.5 g/dL — ABNORMAL LOW (ref 11.6–15.9)
LYMPH#: 1.6 10*3/uL (ref 0.9–3.3)
LYMPH%: 22.9 % (ref 14.0–48.0)
MCH: 25.4 pg — ABNORMAL LOW (ref 26.0–34.0)
MCHC: 31.8 g/dL — AB (ref 32.0–36.0)
MCV: 80 fL — ABNORMAL LOW (ref 81–101)
MONO#: 0.7 10*3/uL (ref 0.1–0.9)
MONO%: 10.9 % (ref 0.0–13.0)
NEUT%: 63.4 % (ref 39.6–80.0)
NEUTROS ABS: 4.3 10*3/uL (ref 1.5–6.5)
PLATELETS: 467 10*3/uL — AB (ref 145–400)
RBC: 4.13 10*6/uL (ref 3.70–5.32)
RDW: 16.9 % — AB (ref 11.1–15.7)
WBC: 6.8 10*3/uL (ref 3.9–10.0)

## 2013-05-08 NOTE — Progress Notes (Signed)
  DIAGNOSIS:  Essential thrombocythemia-Calreticulin (+)   CURRENT THERAPY:  Anagrelide 6mg  po q day   INTERIM HISTORY:  Alexandra [name] comes in for followup. She her birthday in yesterday. She great time.    We increased her dose of anagrelide to 6 mg a day. This is helping finally. We were giving her platelet count down.    She's had no problems with the anagrelide dose. There's no swelling. There is no fluid retention. She no cough. No shortness of breath. She has no headache. There is no diarrhea.    She's working. She's having no joint issues. There's no rashes. There is no bleeding.   PHYSICAL EXAMINATION: Slender [name] in no obvious distress. Vital signs are temperature of 98.2. Pulse 87. Blood pressure 135/78. Weight is 150 pounds. Head and neck exam shows ocular lesions. There is no scleral icterus. She has no mucositis and oral cavity. Neck is supple. No lymphadenopathy is noted. Lungs are clear bilateral. Cardiac exam regular rate and rhythm with no murmurs rubs or bruits. Abdomen is soft. She has good bowel sounds. There is no fluid wave. There is no palpable hepato- splenomegaly. Back exam no tenderness over the spine ribs or hips. Extremities no clubbing cyanosis or edema. She has good range of motion of her joints. Skin exam no ecchymoses or petechia. Neurological exam no focal neurological deficits.   LABORATORY STUDIES:  White cell count 6.8. Hemoglobin 10.5. Platelet count 467. MCV is 80.   IMPRESSION:  Alexandra [name] is a 63 year old Afro-American female. She has essential thrombocythemia. She is now responding to the high dose of anagrelide. She is on the highest dose of anagrelide that I can remember that patient has been on. She tolerated this well.   I am not going to make a change with anagrelide dosing yet. I looked at her blood smear. Her platelet count was enjoy now. She still has several large platelets. White cells and red cells appeared  okay. She may have had some target cells. There was a microcytic red cells. I do not see any immature myeloid or hyper segmented myeloid forms.  We will continue check her every 2 weeks.  If we see that her platelet count continues to improve, then we will cut her anagrelide dose down to 5 mg.  I want to see her back in 6 weeks.  [name], MD 05/08/2013

## 2013-05-11 ENCOUNTER — Ambulatory Visit: Payer: BC Managed Care – PPO | Admitting: Hematology & Oncology

## 2013-05-11 ENCOUNTER — Other Ambulatory Visit: Payer: BC Managed Care – PPO | Admitting: Lab

## 2013-05-12 ENCOUNTER — Other Ambulatory Visit: Payer: Self-pay | Admitting: Internal Medicine

## 2013-05-24 ENCOUNTER — Other Ambulatory Visit (HOSPITAL_BASED_OUTPATIENT_CLINIC_OR_DEPARTMENT_OTHER): Payer: BC Managed Care – PPO

## 2013-05-24 DIAGNOSIS — D473 Essential (hemorrhagic) thrombocythemia: Secondary | ICD-10-CM

## 2013-05-24 LAB — CBC WITH DIFFERENTIAL/PLATELET
BASO%: 1.2 % (ref 0.0–2.0)
Basophils Absolute: 0.1 10*3/uL (ref 0.0–0.1)
EOS ABS: 0.1 10*3/uL (ref 0.0–0.5)
EOS%: 1.7 % (ref 0.0–7.0)
HEMATOCRIT: 29.4 % — AB (ref 34.8–46.6)
HGB: 9.5 g/dL — ABNORMAL LOW (ref 11.6–15.9)
LYMPH%: 21.5 % (ref 14.0–49.7)
MCH: 25.1 pg (ref 25.1–34.0)
MCHC: 32.4 g/dL (ref 31.5–36.0)
MCV: 77.3 fL — AB (ref 79.5–101.0)
MONO#: 0.8 10*3/uL (ref 0.1–0.9)
MONO%: 10.7 % (ref 0.0–14.0)
NEUT#: 4.9 10*3/uL (ref 1.5–6.5)
NEUT%: 64.9 % (ref 38.4–76.8)
Platelets: 465 10*3/uL — ABNORMAL HIGH (ref 145–400)
RBC: 3.8 10*6/uL (ref 3.70–5.45)
RDW: 17.2 % — ABNORMAL HIGH (ref 11.2–14.5)
WBC: 7.6 10*3/uL (ref 3.9–10.3)
lymph#: 1.6 10*3/uL (ref 0.9–3.3)

## 2013-06-01 ENCOUNTER — Ambulatory Visit (INDEPENDENT_AMBULATORY_CARE_PROVIDER_SITE_OTHER): Payer: BC Managed Care – PPO | Admitting: Ophthalmology

## 2013-06-07 ENCOUNTER — Other Ambulatory Visit (HOSPITAL_BASED_OUTPATIENT_CLINIC_OR_DEPARTMENT_OTHER): Payer: BC Managed Care – PPO

## 2013-06-07 DIAGNOSIS — D473 Essential (hemorrhagic) thrombocythemia: Secondary | ICD-10-CM

## 2013-06-07 LAB — CBC WITH DIFFERENTIAL/PLATELET
BASO%: 1.2 % (ref 0.0–2.0)
BASOS ABS: 0.1 10*3/uL (ref 0.0–0.1)
EOS ABS: 0.1 10*3/uL (ref 0.0–0.5)
EOS%: 1.4 % (ref 0.0–7.0)
HCT: 31.6 % — ABNORMAL LOW (ref 34.8–46.6)
HEMOGLOBIN: 10.1 g/dL — AB (ref 11.6–15.9)
LYMPH%: 20.5 % (ref 14.0–49.7)
MCH: 24.8 pg — ABNORMAL LOW (ref 25.1–34.0)
MCHC: 32 g/dL (ref 31.5–36.0)
MCV: 77.7 fL — ABNORMAL LOW (ref 79.5–101.0)
MONO#: 0.6 10*3/uL (ref 0.1–0.9)
MONO%: 9.6 % (ref 0.0–14.0)
NEUT#: 4.5 10*3/uL (ref 1.5–6.5)
NEUT%: 67.3 % (ref 38.4–76.8)
PLATELETS: 485 10*3/uL — AB (ref 145–400)
RBC: 4.07 10*6/uL (ref 3.70–5.45)
RDW: 17.8 % — ABNORMAL HIGH (ref 11.2–14.5)
WBC: 6.7 10*3/uL (ref 3.9–10.3)
lymph#: 1.4 10*3/uL (ref 0.9–3.3)

## 2013-06-14 ENCOUNTER — Telehealth: Payer: Self-pay | Admitting: Internal Medicine

## 2013-06-14 NOTE — Telephone Encounter (Signed)
Pt would like bystolic 20 mg samples

## 2013-06-14 NOTE — Telephone Encounter (Signed)
Spoke to the pt.  Informed her that I do not have any 20 mg samples but am giving her 10 mg.  Instructed to take 2 daily. She will pick up at the front desk.

## 2013-06-21 ENCOUNTER — Ambulatory Visit: Payer: BC Managed Care – PPO | Admitting: Hematology & Oncology

## 2013-06-21 ENCOUNTER — Other Ambulatory Visit: Payer: BC Managed Care – PPO | Admitting: Lab

## 2013-06-22 ENCOUNTER — Other Ambulatory Visit (HOSPITAL_BASED_OUTPATIENT_CLINIC_OR_DEPARTMENT_OTHER): Payer: BC Managed Care – PPO | Admitting: Lab

## 2013-06-22 ENCOUNTER — Telehealth: Payer: Self-pay | Admitting: Hematology & Oncology

## 2013-06-22 ENCOUNTER — Encounter: Payer: Self-pay | Admitting: Hematology & Oncology

## 2013-06-22 ENCOUNTER — Ambulatory Visit (HOSPITAL_BASED_OUTPATIENT_CLINIC_OR_DEPARTMENT_OTHER): Payer: BC Managed Care – PPO | Admitting: Hematology & Oncology

## 2013-06-22 VITALS — BP 157/86 | HR 85 | Temp 97.7°F | Resp 14 | Ht 69.0 in | Wt 147.0 lb

## 2013-06-22 DIAGNOSIS — D473 Essential (hemorrhagic) thrombocythemia: Secondary | ICD-10-CM

## 2013-06-22 DIAGNOSIS — D509 Iron deficiency anemia, unspecified: Secondary | ICD-10-CM

## 2013-06-22 LAB — CBC WITH DIFFERENTIAL (CANCER CENTER ONLY)
BASO#: 0 10*3/uL (ref 0.0–0.2)
BASO%: 0.6 % (ref 0.0–2.0)
EOS%: 1.7 % (ref 0.0–7.0)
Eosinophils Absolute: 0.1 10*3/uL (ref 0.0–0.5)
HCT: 33.1 % — ABNORMAL LOW (ref 34.8–46.6)
HGB: 10.7 g/dL — ABNORMAL LOW (ref 11.6–15.9)
LYMPH#: 1.4 10*3/uL (ref 0.9–3.3)
LYMPH%: 20 % (ref 14.0–48.0)
MCH: 25.5 pg — AB (ref 26.0–34.0)
MCHC: 32.3 g/dL (ref 32.0–36.0)
MCV: 79 fL — AB (ref 81–101)
MONO#: 0.6 10*3/uL (ref 0.1–0.9)
MONO%: 8.9 % (ref 0.0–13.0)
NEUT#: 4.9 10*3/uL (ref 1.5–6.5)
NEUT%: 68.8 % (ref 39.6–80.0)
PLATELETS: 444 10*3/uL — AB (ref 145–400)
RBC: 4.19 10*6/uL (ref 3.70–5.32)
RDW: 17.7 % — AB (ref 11.1–15.7)
WBC: 7.1 10*3/uL (ref 3.9–10.0)

## 2013-06-22 LAB — CMP (CANCER CENTER ONLY)
ALBUMIN: 3.4 g/dL (ref 3.3–5.5)
ALK PHOS: 108 U/L — AB (ref 26–84)
ALT(SGPT): 22 U/L (ref 10–47)
AST: 21 U/L (ref 11–38)
BUN, Bld: 11 mg/dL (ref 7–22)
CO2: 32 meq/L (ref 18–33)
Calcium: 9.2 mg/dL (ref 8.0–10.3)
Chloride: 104 mEq/L (ref 98–108)
Creat: 0.7 mg/dl (ref 0.6–1.2)
GLUCOSE: 113 mg/dL (ref 73–118)
POTASSIUM: 3 meq/L — AB (ref 3.3–4.7)
Sodium: 143 mEq/L (ref 128–145)
TOTAL PROTEIN: 7.6 g/dL (ref 6.4–8.1)
Total Bilirubin: 0.8 mg/dl (ref 0.20–1.60)

## 2013-06-22 LAB — IRON AND TIBC CHCC
%SAT: 36 % (ref 21–57)
IRON: 81 ug/dL (ref 41–142)
TIBC: 224 ug/dL — ABNORMAL LOW (ref 236–444)
UIBC: 143 ug/dL (ref 120–384)

## 2013-06-22 LAB — FERRITIN CHCC: Ferritin: 269 ng/ml (ref 9–269)

## 2013-06-22 LAB — CHCC SATELLITE - SMEAR

## 2013-06-22 MED ORDER — INTEGRA 62.5-62.5-40-3 MG PO CAPS: 1.0000 | ORAL_CAPSULE | Freq: Every day | ORAL | Status: DC

## 2013-06-22 NOTE — Telephone Encounter (Signed)
RN aware pt scheduled 6-10 instead of 6-3 she wanted morning appointments. I offered her 6-3 for lab only but she wanted to do all on the 10th

## 2013-06-22 NOTE — Progress Notes (Signed)
Hematology and Oncology Follow Up Visit  KHAYA THEISSEN [id number] [birth year] 63 y.o. 06/22/2013   Principle Diagnosis:   Essential thrombocythemia-Calreticulin positive  Current Therapy:    Anagrelide 6 mg by mouth daily  Aspirin 81 mg by mouth daily     Interim History:  Ms.  Fusco is is back for followup. She's done quite well. She is still working full-time over at [employer]. She's getting ready for graduation.  She's had no problems with swelling in the legs. There's been no headache. There's been no palpitations. She's had no shortness of breath. There's been no rashes. She's had no change in bowel or bladder habits.  Overall, her performance status is ECOG 0.  Medications: Current outpatient prescriptions:amLODipine (NORVASC) 5 MG tablet, Take 5 mg by mouth daily with breakfast., Disp: , Rfl: ;  anagrelide (AGRYLIN) 1 MG capsule, TAKE 6 CAPSULES BY MOUTH DAILY, Disp: 180 capsule, Rfl: 3;  aspirin 81 MG tablet, Take 81 mg by mouth daily., Disp: , Rfl: ;  Blood Glucose Monitoring Suppl (ONE TOUCH ULTRA SYSTEM KIT) W/DEVICE KIT, 1 kit daily. Pt is Pre-Diabetic, Disp: , Rfl:  Fe Fum-FePoly-Vit C-Vit B3 (INTEGRA) 62.5-62.5-40-3 MG CAPS, TAKE ONE CAPSULE BY MOUTH EVERY DAY, Disp: 30 capsule, Rfl: 0;  lisinopril (PRINIVIL,ZESTRIL) 20 MG tablet, TAKE TWO TABLETS BY MOUTH ONCE DAILY, Disp: 60 tablet, Rfl: 2;  Multiple Vitamin (MULTI-VITAMIN DAILY PO), Take 1 tablet by mouth every morning. , Disp: , Rfl: ;  Nebivolol HCl (BYSTOLIC) 20 MG TABS, Take 20 mg by mouth daily with breakfast., Disp: , Rfl:  ONE TOUCH ULTRA TEST test strip, 1 each by Other route as needed. , Disp: , Rfl:   Allergies: No Known Allergies  Past Medical History, Surgical history, Social history, and Family History were reviewed and updated.  Review of Systems: As above  Physical Exam:  height is '5\' 9"'  (1.753 m) and weight is 147 lb (66.679 kg). Her oral temperature is 97.7 F (36.5 C). Her blood pressure is  157/86 and her pulse is 85. Her respiration is 14.   Well-developed well-nourished African American female. Head and exam shows no ocular or oral lesions. There are no palpable cervical or supraclavicular lymph nodes. Lungs are clear bilaterally. Cardiac exam regular in rhythm with a normal S1 and S2. There are no murmurs rubs or bruits. Abdomen is soft. She has good bowel sounds. There is a palpable abdominal mass. There is a probable liver or spleen tip. Exam no tenderness over the spine ribs or hips. Extremities shows no clubbing cyanosis or edema. Skin exam no rashes. Neurological exam nonfocal.  Lab Results  Component Value Date   WBC 7.1 06/22/2013   HGB 10.7* 06/22/2013   HCT 33.1* 06/22/2013   MCV 79* 06/22/2013   PLT 444* 06/22/2013     Chemistry      Component Value Date/Time   NA 143 06/22/2013 0904   NA 141 04/19/2012 0838   K 3.0* 06/22/2013 0904   K 3.8 04/19/2012 0838   CL 104 06/22/2013 0904   CL 108 04/19/2012 0838   CO2 32 06/22/2013 0904   CO2 27 04/19/2012 0838   BUN 11 06/22/2013 0904   BUN 18 04/19/2012 0838   CREATININE 0.7 06/22/2013 0904   CREATININE 0.7 04/19/2012 0838      Component Value Date/Time   CALCIUM 9.2 06/22/2013 0904   CALCIUM 8.8 04/19/2012 0838   ALKPHOS 108* 06/22/2013 0904   ALKPHOS 105 04/19/2012 0838   AST 21  06/22/2013 0904   AST 17 04/19/2012 0838   ALT 22 06/22/2013 0904   ALT 18 04/19/2012 0838   BILITOT 0.80 06/22/2013 0904   BILITOT 0.7 04/19/2012 0838         Impression and Plan: Ms. Rooke is 63 year old Afro-American female. She has essential thrombocythemia. Refine our interplays under good control. She's tolerating the anagrelide dose very well.  We will go ahead and continue her on the 6 mg daily dose of anagrelide. She will be on her aspirin.  We will her appointments out to every 3 weeks for blood check.  I didn't look at her blood smear. The platelets are coming down nicely. She still has a few large platelets. Red cell morphology and  white cell morphology are normal.  I will see her back myself in 6 weeks.   [name], MD 4/23/201510:14 AM

## 2013-06-30 LAB — ALPHA-THALASSEMIA GENOTYPR

## 2013-07-01 ENCOUNTER — Other Ambulatory Visit: Payer: Self-pay | Admitting: Hematology & Oncology

## 2013-07-05 ENCOUNTER — Other Ambulatory Visit: Payer: BC Managed Care – PPO

## 2013-07-12 ENCOUNTER — Other Ambulatory Visit (HOSPITAL_BASED_OUTPATIENT_CLINIC_OR_DEPARTMENT_OTHER): Payer: BC Managed Care – PPO

## 2013-07-12 DIAGNOSIS — D473 Essential (hemorrhagic) thrombocythemia: Secondary | ICD-10-CM

## 2013-07-12 LAB — CBC WITH DIFFERENTIAL/PLATELET
BASO%: 1.4 % (ref 0.0–2.0)
Basophils Absolute: 0.1 10*3/uL (ref 0.0–0.1)
EOS%: 2 % (ref 0.0–7.0)
Eosinophils Absolute: 0.1 10*3/uL (ref 0.0–0.5)
HCT: 30.5 % — ABNORMAL LOW (ref 34.8–46.6)
HGB: 9.9 g/dL — ABNORMAL LOW (ref 11.6–15.9)
LYMPH%: 23.7 % (ref 14.0–49.7)
MCH: 25.3 pg (ref 25.1–34.0)
MCHC: 32.4 g/dL (ref 31.5–36.0)
MCV: 78.3 fL — ABNORMAL LOW (ref 79.5–101.0)
MONO#: 0.8 10*3/uL (ref 0.1–0.9)
MONO%: 11.1 % (ref 0.0–14.0)
NEUT#: 4.3 10*3/uL (ref 1.5–6.5)
NEUT%: 61.8 % (ref 38.4–76.8)
PLATELETS: 843 10*3/uL — AB (ref 145–400)
RBC: 3.9 10*6/uL (ref 3.70–5.45)
RDW: 18.6 % — ABNORMAL HIGH (ref 11.2–14.5)
WBC: 6.9 10*3/uL (ref 3.9–10.3)
lymph#: 1.6 10*3/uL (ref 0.9–3.3)

## 2013-07-19 ENCOUNTER — Other Ambulatory Visit: Payer: BC Managed Care – PPO

## 2013-08-01 ENCOUNTER — Encounter (HOSPITAL_COMMUNITY): Payer: Self-pay

## 2013-08-09 ENCOUNTER — Encounter: Payer: Self-pay | Admitting: Hematology & Oncology

## 2013-08-09 ENCOUNTER — Other Ambulatory Visit (HOSPITAL_BASED_OUTPATIENT_CLINIC_OR_DEPARTMENT_OTHER): Payer: BC Managed Care – PPO | Admitting: Lab

## 2013-08-09 ENCOUNTER — Ambulatory Visit (HOSPITAL_BASED_OUTPATIENT_CLINIC_OR_DEPARTMENT_OTHER): Payer: BC Managed Care – PPO | Admitting: Hematology & Oncology

## 2013-08-09 VITALS — BP 158/86 | HR 86 | Temp 97.9°F | Resp 14 | Ht 69.0 in | Wt 146.0 lb

## 2013-08-09 DIAGNOSIS — D473 Essential (hemorrhagic) thrombocythemia: Secondary | ICD-10-CM

## 2013-08-09 DIAGNOSIS — E559 Vitamin D deficiency, unspecified: Secondary | ICD-10-CM

## 2013-08-09 DIAGNOSIS — D509 Iron deficiency anemia, unspecified: Secondary | ICD-10-CM

## 2013-08-09 LAB — CMP (CANCER CENTER ONLY)
ALK PHOS: 109 U/L — AB (ref 26–84)
ALT: 28 U/L (ref 10–47)
AST: 25 U/L (ref 11–38)
Albumin: 3.4 g/dL (ref 3.3–5.5)
BUN, Bld: 14 mg/dL (ref 7–22)
CALCIUM: 9.1 mg/dL (ref 8.0–10.3)
CO2: 30 mEq/L (ref 18–33)
CREATININE: 0.7 mg/dL (ref 0.6–1.2)
Chloride: 102 mEq/L (ref 98–108)
Glucose, Bld: 118 mg/dL (ref 73–118)
Potassium: 3 mEq/L — CL (ref 3.3–4.7)
Sodium: 138 mEq/L (ref 128–145)
Total Bilirubin: 0.8 mg/dl (ref 0.20–1.60)
Total Protein: 8 g/dL (ref 6.4–8.1)

## 2013-08-09 LAB — CBC WITH DIFFERENTIAL (CANCER CENTER ONLY)
BASO#: 0 10*3/uL (ref 0.0–0.2)
BASO%: 0.5 % (ref 0.0–2.0)
EOS%: 1.5 % (ref 0.0–7.0)
Eosinophils Absolute: 0.1 10*3/uL (ref 0.0–0.5)
HEMATOCRIT: 32.6 % — AB (ref 34.8–46.6)
HGB: 10.5 g/dL — ABNORMAL LOW (ref 11.6–15.9)
LYMPH#: 1.2 10*3/uL (ref 0.9–3.3)
LYMPH%: 19.1 % (ref 14.0–48.0)
MCH: 25.8 pg — ABNORMAL LOW (ref 26.0–34.0)
MCHC: 32.2 g/dL (ref 32.0–36.0)
MCV: 80 fL — AB (ref 81–101)
MONO#: 0.8 10*3/uL (ref 0.1–0.9)
MONO%: 12.4 % (ref 0.0–13.0)
NEUT#: 4.1 10*3/uL (ref 1.5–6.5)
NEUT%: 66.5 % (ref 39.6–80.0)
Platelets: 457 10*3/uL — ABNORMAL HIGH (ref 145–400)
RBC: 4.07 10*6/uL (ref 3.70–5.32)
RDW: 17.5 % — AB (ref 11.1–15.7)
WBC: 6.2 10*3/uL (ref 3.9–10.0)

## 2013-08-09 LAB — CHCC SATELLITE - SMEAR

## 2013-08-09 LAB — IRON AND TIBC CHCC
%SAT: 39 % (ref 21–57)
IRON: 84 ug/dL (ref 41–142)
TIBC: 216 ug/dL — AB (ref 236–444)
UIBC: 132 ug/dL (ref 120–384)

## 2013-08-09 LAB — FERRITIN CHCC: Ferritin: 249 ng/ml (ref 9–269)

## 2013-08-09 NOTE — Progress Notes (Signed)
Hematology and Oncology Follow Up Visit  Alexandra [name] [id number] 07/02/1950 63 y.o. 08/09/2013   Principle Diagnosis:   Essential thrombocythemia-Calreticulin positive    Current Therapy:    Anagrelide 6 mg by mouth daily  Aspirin 81 mg by mouth daily     Interim History:  Ms.  Alexandra [name] is in for followup. She's doing quite well. She is a little bit of a rash on her legs. This may be from the anagrelide. She's had no swelling in the legs. She's had no cough or shortness of breath. She's had no abdominal pain. His been no diarrhea. She's still working full-time. She's had no bleeding or bruising. She's had no dysphasia. There's been no visual issues.  Medications: Current outpatient prescriptions:amLODipine (NORVASC) 5 MG tablet, Take 5 mg by mouth daily with breakfast., Disp: , Rfl: ;  anagrelide (AGRYLIN) 1 MG capsule, TAKE 6 CAPSULES BY MOUTH DAILY, Disp: 180 capsule, Rfl: 3;  aspirin 81 MG tablet, Take 81 mg by mouth daily., Disp: , Rfl: ;  Blood Glucose Monitoring Suppl (ONE TOUCH ULTRA SYSTEM KIT) W/DEVICE KIT, 1 kit daily. Pt is Pre-Diabetic, Disp: , Rfl:  Fe Fum-FePoly-Vit C-Vit B3 (INTEGRA) 62.5-62.5-40-3 MG CAPS, Take 1 capsule by mouth daily., Disp: 30 capsule, Rfl: 12;  lisinopril (PRINIVIL,ZESTRIL) 20 MG tablet, TAKE TWO TABLETS BY MOUTH ONCE DAILY, Disp: 60 tablet, Rfl: 2;  Multiple Vitamin (MULTI-VITAMIN DAILY PO), Take 1 tablet by mouth every morning. , Disp: , Rfl: ;  Nebivolol HCl (BYSTOLIC) 20 MG TABS, Take 20 mg by mouth daily with breakfast., Disp: , Rfl:  ONE TOUCH ULTRA TEST test strip, 1 each by Other route as needed. , Disp: , Rfl:   Allergies: No Known Allergies  Past Medical History, Surgical history, Social history, and Family History were reviewed and updated.  Review of Systems: As above  Physical Exam:  height is '5\' 9"'  (1.753 m) and weight is 146 lb (66.225 kg). Her oral temperature is 97.9 F (36.6 C). Her blood pressure is 158/86 and her pulse is 86.  Her respiration is 14.   Well-developed and well-nourished African American female. Her head and neck exam shows Dr. or oral lesions. There is no adenopathy in the neck. Lungs are clear. Cardiac exam regular in rhythm. Abdomen is soft. Good bowel sounds. There is no fluid wave. There is no palpable liver or spleen. Back exam no tenderness over the spine ribs or hips. Extremities shows no clubbing cyanosis or edema. She is originally from her joints. Skin exam does show a faint rash on her lower legs. Neurological exam is nonfocal.  Lab Results  Component Value Date   WBC 6.2 08/09/2013   HGB 10.5* 08/09/2013   HCT 32.6* 08/09/2013   MCV 80* 08/09/2013   PLT 457* 08/09/2013     Chemistry      Component Value Date/Time   NA 143 06/22/2013 0904   NA 141 04/19/2012 0838   K 3.0* 06/22/2013 0904   K 3.8 04/19/2012 0838   CL 104 06/22/2013 0904   CL 108 04/19/2012 0838   CO2 32 06/22/2013 0904   CO2 27 04/19/2012 0838   BUN 11 06/22/2013 0904   BUN 18 04/19/2012 0838   CREATININE 0.7 06/22/2013 0904   CREATININE 0.7 04/19/2012 0838      Component Value Date/Time   CALCIUM 9.2 06/22/2013 0904   CALCIUM 8.8 04/19/2012 0838   ALKPHOS 108* 06/22/2013 0904   ALKPHOS 105 04/19/2012 0838   AST 21 06/22/2013 0904  AST 17 04/19/2012 0838   ALT 22 06/22/2013 0904   ALT 18 04/19/2012 0838   BILITOT 0.80 06/22/2013 0904   BILITOT 0.7 04/19/2012 0838         Impression and Plan: [name] is 63 year old African American female with essential thrombocythemia. She is responding nicely to the anagrelide. Her platelet count is holding steady. Her anemia is mild and asymptomatic.  I think we can probably hold off on doing routine blood work on her. Her platelet count has the stable now for a couple months.  I will see her back in 2 months. I think this would be reasonable.   [name], MD 6/10/201510:23 AM

## 2013-08-10 ENCOUNTER — Telehealth: Payer: Self-pay | Admitting: *Deleted

## 2013-08-10 LAB — VITAMIN D 25 HYDROXY (VIT D DEFICIENCY, FRACTURES): VIT D 25 HYDROXY: 20 ng/mL — AB (ref 30–89)

## 2013-08-10 MED ORDER — VITAMIN D3 1.25 MG (50000 UT) PO CAPS: 50000.0000 [IU] | ORAL_CAPSULE | ORAL | Status: DC

## 2013-08-10 NOTE — Telephone Encounter (Addendum)
Message copied by Lenn Sink on Thu Aug 10, 2013 10:18 AM ------      Message from: Burney Gauze R      Created: Thu Aug 10, 2013  6:45 AM       Call - vit D is low.  Need vit D 50,000units a week.  I will send this to her pharmacy. pete ------Informed pt that Dr Marin Olp has prescribed her vitamin d to take to increase her vit d level.

## 2013-08-10 NOTE — Addendum Note (Signed)
Addended by: [name] on: 08/10/2013 06:48 AM   Modules accepted: Orders

## 2013-08-16 ENCOUNTER — Other Ambulatory Visit: Payer: Self-pay | Admitting: *Deleted

## 2013-08-16 MED ORDER — ERGOCALCIFEROL 1.25 MG (50000 UT) PO CAPS: 50000.0000 [IU] | ORAL_CAPSULE | ORAL | Status: DC

## 2013-09-02 ENCOUNTER — Other Ambulatory Visit: Payer: Self-pay | Admitting: Hematology & Oncology

## 2013-09-06 ENCOUNTER — Other Ambulatory Visit: Payer: Self-pay | Admitting: Hematology & Oncology

## 2013-09-21 ENCOUNTER — Ambulatory Visit: Payer: BC Managed Care – PPO | Admitting: Hematology & Oncology

## 2013-09-21 ENCOUNTER — Telehealth: Payer: Self-pay | Admitting: Hematology & Oncology

## 2013-09-21 ENCOUNTER — Other Ambulatory Visit: Payer: BC Managed Care – PPO | Admitting: Lab

## 2013-09-21 NOTE — Telephone Encounter (Signed)
Patient called and cx 09/21/13 apt.  She stated she will call back to resch.  Rn was notified

## 2013-10-01 ENCOUNTER — Other Ambulatory Visit: Payer: Self-pay | Admitting: Internal Medicine

## 2013-10-02 ENCOUNTER — Telehealth: Payer: Self-pay | Admitting: Family Medicine

## 2013-10-02 NOTE — Telephone Encounter (Signed)
Patient is due to CPE and lab work.  Please schedule both appointments with the pt.  Refilled her lisinopril for 30 days.  Will refill more when needed after she makes an appt.

## 2013-10-02 NOTE — Telephone Encounter (Signed)
Refilled for 30 days so pt will not be out of her medication.  Sent a message to the front for the pt to be scheduled for CPE and lab work.

## 2013-10-05 ENCOUNTER — Ambulatory Visit (HOSPITAL_BASED_OUTPATIENT_CLINIC_OR_DEPARTMENT_OTHER): Payer: BC Managed Care – PPO

## 2013-10-05 DIAGNOSIS — E559 Vitamin D deficiency, unspecified: Secondary | ICD-10-CM

## 2013-10-05 DIAGNOSIS — D473 Essential (hemorrhagic) thrombocythemia: Secondary | ICD-10-CM

## 2013-10-05 LAB — CBC & DIFF AND RETIC
BASO%: 0.4 % (ref 0.0–2.0)
Basophils Absolute: 0 10*3/uL (ref 0.0–0.1)
EOS%: 0.9 % (ref 0.0–7.0)
Eosinophils Absolute: 0.1 10*3/uL (ref 0.0–0.5)
HCT: 30 % — ABNORMAL LOW (ref 34.8–46.6)
HGB: 9.6 g/dL — ABNORMAL LOW (ref 11.6–15.9)
Immature Retic Fract: 6.1 % (ref 1.60–10.00)
LYMPH%: 23.8 % (ref 14.0–49.7)
MCH: 24.7 pg — AB (ref 25.1–34.0)
MCHC: 32 g/dL (ref 31.5–36.0)
MCV: 77.1 fL — AB (ref 79.5–101.0)
MONO#: 0.9 10*3/uL (ref 0.1–0.9)
MONO%: 12.1 % (ref 0.0–14.0)
NEUT#: 4.4 10*3/uL (ref 1.5–6.5)
NEUT%: 62.8 % (ref 38.4–76.8)
PLATELETS: 420 10*3/uL — AB (ref 145–400)
RBC: 3.89 10*6/uL (ref 3.70–5.45)
RDW: 17.7 % — AB (ref 11.2–14.5)
RETIC %: 0.99 % (ref 0.70–2.10)
Retic Ct Abs: 38.51 10*3/uL (ref 33.70–90.70)
WBC: 7.1 10*3/uL (ref 3.9–10.3)
lymph#: 1.7 10*3/uL (ref 0.9–3.3)
nRBC: 0 % (ref 0–0)

## 2013-10-05 LAB — COMPREHENSIVE METABOLIC PANEL (CC13)
ALK PHOS: 108 U/L (ref 40–150)
ALT: 16 U/L (ref 0–55)
AST: 15 U/L (ref 5–34)
Albumin: 3.5 g/dL (ref 3.5–5.0)
Anion Gap: 7 mEq/L (ref 3–11)
BUN: 11.6 mg/dL (ref 7.0–26.0)
CALCIUM: 9.1 mg/dL (ref 8.4–10.4)
CO2: 28 meq/L (ref 22–29)
Chloride: 106 mEq/L (ref 98–109)
Creatinine: 0.7 mg/dL (ref 0.6–1.1)
Glucose: 121 mg/dl (ref 70–140)
Potassium: 3.6 mEq/L (ref 3.5–5.1)
Sodium: 141 mEq/L (ref 136–145)
TOTAL PROTEIN: 7 g/dL (ref 6.4–8.3)
Total Bilirubin: 0.63 mg/dL (ref 0.20–1.20)

## 2013-10-05 LAB — IRON AND TIBC CHCC
%SAT: 31 % (ref 21–57)
Iron: 62 ug/dL (ref 41–142)
TIBC: 200 ug/dL — ABNORMAL LOW (ref 236–444)
UIBC: 139 ug/dL (ref 120–384)

## 2013-10-05 LAB — CHCC SATELLITE - SMEAR

## 2013-10-06 ENCOUNTER — Telehealth: Payer: Self-pay | Admitting: *Deleted

## 2013-10-06 NOTE — Telephone Encounter (Signed)
Pt confirmed that she received voicemail and pt states she understands she needs to take 5 pills a day of Anagrelide

## 2013-10-06 NOTE — Telephone Encounter (Addendum)
Message copied by Lenn Sink on Fri Oct 06, 2013  8:58 AM ------      Message from: Burney Gauze R      Created: Thu Oct 05, 2013 10:18 PM       Call - platelets still coming down!! Go down to 5 pills a day of Anagrelide.  pete ------Left voicemail informing pt that platelets are still coming down!! Go down to 5 pills a day of Anagrelide. Informed pt to call our office back to inform us that she understands this voicemail.

## 2013-10-09 NOTE — Telephone Encounter (Signed)
lmom for pt to cb

## 2013-10-11 ENCOUNTER — Encounter: Payer: Self-pay | Admitting: Hematology & Oncology

## 2013-10-11 ENCOUNTER — Ambulatory Visit (HOSPITAL_BASED_OUTPATIENT_CLINIC_OR_DEPARTMENT_OTHER): Payer: BC Managed Care – PPO | Admitting: Hematology & Oncology

## 2013-10-11 ENCOUNTER — Other Ambulatory Visit: Payer: Self-pay | Admitting: Internal Medicine

## 2013-10-11 VITALS — BP 154/82 | HR 89 | Temp 98.0°F | Resp 14 | Ht 69.0 in | Wt 146.0 lb

## 2013-10-11 DIAGNOSIS — D509 Iron deficiency anemia, unspecified: Secondary | ICD-10-CM

## 2013-10-11 DIAGNOSIS — D473 Essential (hemorrhagic) thrombocythemia: Secondary | ICD-10-CM

## 2013-10-11 DIAGNOSIS — E559 Vitamin D deficiency, unspecified: Secondary | ICD-10-CM

## 2013-10-11 NOTE — Telephone Encounter (Signed)
Pt unable to sch now will callback

## 2013-10-12 ENCOUNTER — Telehealth: Payer: Self-pay | Admitting: Hematology & Oncology

## 2013-10-12 ENCOUNTER — Telehealth: Payer: Self-pay | Admitting: Family Medicine

## 2013-10-12 NOTE — Telephone Encounter (Signed)
This patient has not been seen since 09/2012.  She is past due to see Dr. Regis Bill.  Please schedule the patient for a follow up.  Thanks!

## 2013-10-12 NOTE — Progress Notes (Signed)
Hematology and Oncology Follow Up Visit  Alexandra [name] [id number] Jan 11, 1951 63 y.o. 10/12/2013   Principle Diagnosis:   Anagrelide 5 mg by mouth daily  Aspirin 81 mg by mouth daily  Current Therapy:    Essential thrombocythemia-Calreticulin positive       Interim History:  [name] is back for followup. She continues to do well. We have been able to cut her anagrelide dose back. Her blood count has been maintaining itself at a nice level.  She's had no problems with toxicity from treatment. She's had no nausea vomiting. There's been no leg swelling. She's had no rashes. There's been no fever. She's had no bleeding.  Of note, she is on oral iron. When we check her iron studies with her last visit, her iron saturation was 31%. Her total iron was 62.  She had a vitamin D level of 20,000 back in June. She currently is on 50,000 units of vitamin D a week.  She is still working. There's no fatigue.  Medications: Current outpatient prescriptions:amLODipine (NORVASC) 5 MG tablet, Take 5 mg by mouth daily with breakfast., Disp: , Rfl: ;  anagrelide (AGRYLIN) 1 MG capsule, TAKE 5 CAPSULES BY MOUTH DAILY, Disp: , Rfl: ;  aspirin 81 MG tablet, Take 81 mg by mouth daily., Disp: , Rfl: ;  Blood Glucose Monitoring Suppl (ONE TOUCH ULTRA SYSTEM KIT) W/DEVICE KIT, 1 kit daily. Pt is Pre-Diabetic, Disp: , Rfl:  ergocalciferol (VITAMIN D2) 50000 UNITS capsule, Take 1 capsule (50,000 Units total) by mouth once a week., Disp: 4 capsule, Rfl: 12;  Fe Fum-FePoly-Vit C-Vit B3 (INTEGRA) 62.5-62.5-40-3 MG CAPS, Take 1 capsule by mouth daily., Disp: 30 capsule, Rfl: 12;  lisinopril (PRINIVIL,ZESTRIL) 20 MG tablet, TAKE TWO TABLETS BY MOUTH ONCE DAILY, Disp: 60 tablet, Rfl: 0 Nebivolol HCl (BYSTOLIC) 20 MG TABS, Take 20 mg by mouth daily with breakfast., Disp: , Rfl: ;  ONE TOUCH ULTRA TEST test strip, 1 each by Other route as needed. , Disp: , Rfl:   Allergies: No Known Allergies  Past Medical History,  Surgical history, Social history, and Family History were reviewed and updated.  Review of Systems: As above  Physical Exam:  height is '5\' 9"'  (1.753 m) and weight is 146 lb (66.225 kg). Her oral temperature is 98 F (36.7 C). Her blood pressure is 154/82 and her pulse is 89. Her respiration is 14.   Well-developed and well-nourished African American female. Head and neck exam shows no ocular or oral lesions. There are no palpable cervical or supraclavicular lymph nodes. Lungs are clear. Cardiac exam regular in rhythm with no murmurs rubs or bruits. Abdomen is soft. She is good bowel sounds. There is no fluid wave. There is no palpable liver or spleen tip. Back exam shows no tenderness over the spine ribs or hips. Extremities shows no clubbing cyanosis or edema. Skin exam shows no erythema on the palms of her hand. She no ecchymoses or petechia. Neurological exam is nonfocal.  Lab Results  Component Value Date   WBC 7.1 10/05/2013   HGB 9.6* 10/05/2013   HCT 30.0* 10/05/2013   MCV 77.1* 10/05/2013   PLT 420* 10/05/2013     Chemistry      Component Value Date/Time   NA 141 10/05/2013 1227   NA 138 08/09/2013 0923   NA 141 04/19/2012 0838   K 3.6 10/05/2013 1227   K 3.0* 08/09/2013 0923   K 3.8 04/19/2012 0838   CL 102 08/09/2013 2094  CL 108 04/19/2012 0838   CO2 28 10/05/2013 1227   CO2 30 08/09/2013 0923   CO2 27 04/19/2012 0838   BUN 11.6 10/05/2013 1227   BUN 14 08/09/2013 0923   BUN 18 04/19/2012 0838   CREATININE 0.7 10/05/2013 1227   CREATININE 0.7 08/09/2013 0923   CREATININE 0.7 04/19/2012 0838      Component Value Date/Time   CALCIUM 9.1 10/05/2013 1227   CALCIUM 9.1 08/09/2013 0923   CALCIUM 8.8 04/19/2012 0838   ALKPHOS 108 10/05/2013 1227   ALKPHOS 109* 08/09/2013 0923   ALKPHOS 105 04/19/2012 0838   AST 15 10/05/2013 1227   AST 25 08/09/2013 0923   AST 17 04/19/2012 0838   ALT 16 10/05/2013 1227   ALT 28 08/09/2013 0923   ALT 18 04/19/2012 0838   BILITOT 0.63 10/05/2013 1227   BILITOT 0.80 08/09/2013  0923   BILITOT 0.7 04/19/2012 0838         Impression and Plan: Alexandra [name] is 63 year old [name]. She has essential thrombocythemia. She actually is Calreticulin positive.  Hopefully, we will be able to continue to taper down the anagrelide.  We will check her blood counts every month now. This will be low at easier for her.  She does have some anemia. We may have to consider IV iron.  I'll see her back in about 2-3 months.   [name], MD 8/13/20156:13 AM

## 2013-10-12 NOTE — Telephone Encounter (Signed)
#  90 sent by e-scribe.  Pt is past due for follow up.  Will send a note to get her scheduled.

## 2013-10-12 NOTE — Telephone Encounter (Signed)
Left pt message with sept labs and oct MD appointments

## 2013-10-12 NOTE — Telephone Encounter (Signed)
lmom for pt to cb

## 2013-10-15 ENCOUNTER — Other Ambulatory Visit: Payer: Self-pay | Admitting: Hematology & Oncology

## 2013-10-16 ENCOUNTER — Other Ambulatory Visit: Payer: Self-pay | Admitting: Internal Medicine

## 2013-10-18 NOTE — Telephone Encounter (Signed)
lmom for pt to cb

## 2013-10-20 NOTE — Telephone Encounter (Signed)
Pt has been scheduled.  °

## 2013-11-01 ENCOUNTER — Other Ambulatory Visit (HOSPITAL_BASED_OUTPATIENT_CLINIC_OR_DEPARTMENT_OTHER): Payer: BC Managed Care – PPO

## 2013-11-01 DIAGNOSIS — E559 Vitamin D deficiency, unspecified: Secondary | ICD-10-CM

## 2013-11-01 DIAGNOSIS — D509 Iron deficiency anemia, unspecified: Secondary | ICD-10-CM

## 2013-11-01 DIAGNOSIS — D473 Essential (hemorrhagic) thrombocythemia: Secondary | ICD-10-CM

## 2013-11-01 LAB — CBC WITH DIFFERENTIAL/PLATELET
BASO%: 1.2 % (ref 0.0–2.0)
Basophils Absolute: 0.1 10*3/uL (ref 0.0–0.1)
EOS%: 1.2 % (ref 0.0–7.0)
Eosinophils Absolute: 0.1 10*3/uL (ref 0.0–0.5)
HEMATOCRIT: 30.4 % — AB (ref 34.8–46.6)
HGB: 9.7 g/dL — ABNORMAL LOW (ref 11.6–15.9)
LYMPH#: 1.4 10*3/uL (ref 0.9–3.3)
LYMPH%: 18.8 % (ref 14.0–49.7)
MCH: 24.7 pg — ABNORMAL LOW (ref 25.1–34.0)
MCHC: 32 g/dL (ref 31.5–36.0)
MCV: 77.3 fL — ABNORMAL LOW (ref 79.5–101.0)
MONO#: 0.7 10*3/uL (ref 0.1–0.9)
MONO%: 10.2 % (ref 0.0–14.0)
NEUT#: 4.9 10*3/uL (ref 1.5–6.5)
NEUT%: 68.6 % (ref 38.4–76.8)
Platelets: 655 10*3/uL — ABNORMAL HIGH (ref 145–400)
RBC: 3.93 10*6/uL (ref 3.70–5.45)
RDW: 18.1 % — ABNORMAL HIGH (ref 11.2–14.5)
WBC: 7.2 10*3/uL (ref 3.9–10.3)

## 2013-11-01 LAB — FERRITIN CHCC: FERRITIN: 262 ng/mL (ref 9–269)

## 2013-11-01 LAB — IRON AND TIBC CHCC
%SAT: 37 % (ref 21–57)
Iron: 77 ug/dL (ref 41–142)
TIBC: 210 ug/dL — AB (ref 236–444)
UIBC: 133 ug/dL (ref 120–384)

## 2013-11-02 ENCOUNTER — Telehealth: Payer: Self-pay | Admitting: *Deleted

## 2013-11-02 NOTE — Telephone Encounter (Signed)
Message copied by Rico Ala on Thu Nov 02, 2013  3:50 PM ------      Message from: Burney Gauze R      Created: Wed Nov 01, 2013  6:27 PM       Call - iron is ok!! The platelets are up a little!! I would NOT change the dose of Anagrelide!!  Make sure she has a CBC ordered for 3 weeks!!  pete ------

## 2013-11-10 ENCOUNTER — Other Ambulatory Visit: Payer: Self-pay | Admitting: Internal Medicine

## 2013-11-10 NOTE — Telephone Encounter (Signed)
Refilled for 1 month.  Pt has upcoming CPE on 12/05/13

## 2013-11-21 ENCOUNTER — Other Ambulatory Visit: Payer: Self-pay | Admitting: *Deleted

## 2013-11-21 ENCOUNTER — Other Ambulatory Visit: Payer: Self-pay | Admitting: Hematology & Oncology

## 2013-11-21 DIAGNOSIS — D509 Iron deficiency anemia, unspecified: Secondary | ICD-10-CM

## 2013-11-22 ENCOUNTER — Other Ambulatory Visit (HOSPITAL_BASED_OUTPATIENT_CLINIC_OR_DEPARTMENT_OTHER): Payer: BC Managed Care – PPO

## 2013-11-22 DIAGNOSIS — D473 Essential (hemorrhagic) thrombocythemia: Secondary | ICD-10-CM

## 2013-11-22 DIAGNOSIS — D509 Iron deficiency anemia, unspecified: Secondary | ICD-10-CM

## 2013-11-22 LAB — CBC WITH DIFFERENTIAL/PLATELET
BASO%: 1.3 % (ref 0.0–2.0)
Basophils Absolute: 0.1 10*3/uL (ref 0.0–0.1)
EOS ABS: 0.1 10*3/uL (ref 0.0–0.5)
EOS%: 1.7 % (ref 0.0–7.0)
HCT: 30.6 % — ABNORMAL LOW (ref 34.8–46.6)
HEMOGLOBIN: 9.7 g/dL — AB (ref 11.6–15.9)
LYMPH%: 21.4 % (ref 14.0–49.7)
MCH: 24.7 pg — ABNORMAL LOW (ref 25.1–34.0)
MCHC: 31.6 g/dL (ref 31.5–36.0)
MCV: 78 fL — ABNORMAL LOW (ref 79.5–101.0)
MONO#: 0.7 10*3/uL (ref 0.1–0.9)
MONO%: 11.8 % (ref 0.0–14.0)
NEUT#: 3.9 10*3/uL (ref 1.5–6.5)
NEUT%: 63.8 % (ref 38.4–76.8)
Platelets: 528 10*3/uL — ABNORMAL HIGH (ref 145–400)
RBC: 3.92 10*6/uL (ref 3.70–5.45)
RDW: 18.4 % — ABNORMAL HIGH (ref 11.2–14.5)
WBC: 6.1 10*3/uL (ref 3.9–10.3)
lymph#: 1.3 10*3/uL (ref 0.9–3.3)

## 2013-11-24 ENCOUNTER — Telehealth: Payer: Self-pay | Admitting: Nurse Practitioner

## 2013-11-24 NOTE — Telephone Encounter (Addendum)
Message copied by Jimmy Footman on Fri Nov 24, 2013 11:16 AM ------      Message from: Burney Gauze R      Created: Thu Nov 23, 2013  6:31 PM       Call - platelets are better!!!  Laurey Arrow ------Pt verbalized understanding and appreciation.

## 2013-11-28 ENCOUNTER — Other Ambulatory Visit (INDEPENDENT_AMBULATORY_CARE_PROVIDER_SITE_OTHER): Payer: BC Managed Care – PPO

## 2013-11-28 DIAGNOSIS — Z0000 Encounter for general adult medical examination without abnormal findings: Secondary | ICD-10-CM

## 2013-11-28 LAB — BASIC METABOLIC PANEL
BUN: 17 mg/dL (ref 6–23)
CO2: 31 meq/L (ref 19–32)
Calcium: 8.9 mg/dL (ref 8.4–10.5)
Chloride: 108 mEq/L (ref 96–112)
Creatinine, Ser: 0.7 mg/dL (ref 0.4–1.2)
GFR: 103.37 mL/min (ref >60.00)
Glucose, Bld: 91 mg/dL (ref 70–99)
POTASSIUM: 3.5 meq/L (ref 3.5–5.1)
Sodium: 141 mEq/L (ref 135–145)

## 2013-11-28 LAB — CBC WITH DIFFERENTIAL/PLATELET
Basophils Absolute: 0 10*3/uL (ref 0.0–0.1)
Basophils Relative: 0.5 % (ref 0.0–3.0)
EOS PCT: 1.6 % (ref 0.0–5.0)
Eosinophils Absolute: 0.1 10*3/uL (ref 0.0–0.7)
HEMATOCRIT: 30.4 % — AB (ref 36.0–46.0)
Hemoglobin: 9.8 g/dL — ABNORMAL LOW (ref 12.0–15.0)
LYMPHS ABS: 1.5 10*3/uL (ref 0.7–4.0)
LYMPHS PCT: 22 % (ref 12.0–46.0)
MCHC: 32.3 g/dL (ref 30.0–36.0)
MCV: 78.7 fl (ref 78.0–100.0)
MONOS PCT: 10.5 % (ref 3.0–12.0)
Monocytes Absolute: 0.7 10*3/uL (ref 0.1–1.0)
Neutro Abs: 4.6 10*3/uL (ref 1.4–7.7)
Neutrophils Relative %: 65.4 % (ref 43.0–77.0)
PLATELETS: 536 10*3/uL — AB (ref 150.0–400.0)
RBC: 3.86 Mil/uL — ABNORMAL LOW (ref 3.87–5.11)
RDW: 19.3 % — ABNORMAL HIGH (ref 11.5–15.5)
WBC: 7 10*3/uL (ref 4.0–10.5)

## 2013-11-28 LAB — MICROALBUMIN / CREATININE URINE RATIO
Creatinine,U: 238.6 mg/dL
MICROALB/CREAT RATIO: 0.6 mg/g (ref 0.0–30.0)
Microalb, Ur: 1.4 mg/dL (ref 0.0–1.9)

## 2013-11-28 LAB — HEPATIC FUNCTION PANEL
ALBUMIN: 3.7 g/dL (ref 3.5–5.2)
ALT: 20 U/L (ref 0–35)
AST: 19 U/L (ref 0–37)
Alkaline Phosphatase: 102 U/L (ref 39–117)
Bilirubin, Direct: 0.1 mg/dL (ref 0.0–0.3)
Total Bilirubin: 0.6 mg/dL (ref 0.2–1.2)
Total Protein: 6.9 g/dL (ref 6.0–8.3)

## 2013-11-28 LAB — POCT URINALYSIS DIPSTICK
Bilirubin, UA: NEGATIVE
Glucose, UA: NEGATIVE
Nitrite, UA: NEGATIVE
RBC UA: NEGATIVE
SPEC GRAV UA: 1.01
Urobilinogen, UA: 1
pH, UA: 7

## 2013-11-28 LAB — LIPID PANEL
CHOLESTEROL: 200 mg/dL (ref 0–200)
HDL: 55.7 mg/dL (ref >39.00)
LDL CALC: 136 mg/dL — AB (ref 0–99)
NonHDL: 144.3
Total CHOL/HDL Ratio: 4
Triglycerides: 43 mg/dL (ref 0.0–149.0)
VLDL: 8.6 mg/dL (ref 0.0–40.0)

## 2013-11-28 LAB — HEMOGLOBIN A1C: Hgb A1c MFr Bld: 6.6 % — ABNORMAL HIGH (ref 4.6–6.5)

## 2013-11-28 LAB — TSH: TSH: 1.39 u[IU]/mL (ref 0.35–4.50)

## 2013-11-28 NOTE — Addendum Note (Signed)
Addended by: Elmer Picker on: 11/28/2013 11:19 AM   Modules accepted: Orders

## 2013-12-05 ENCOUNTER — Ambulatory Visit (INDEPENDENT_AMBULATORY_CARE_PROVIDER_SITE_OTHER): Payer: BC Managed Care – PPO | Admitting: Internal Medicine

## 2013-12-05 ENCOUNTER — Encounter: Payer: Self-pay | Admitting: Internal Medicine

## 2013-12-05 VITALS — BP 158/78 | Temp 97.8°F | Ht 70.0 in | Wt 142.0 lb

## 2013-12-05 DIAGNOSIS — E119 Type 2 diabetes mellitus without complications: Secondary | ICD-10-CM

## 2013-12-05 DIAGNOSIS — Z0000 Encounter for general adult medical examination without abnormal findings: Secondary | ICD-10-CM

## 2013-12-05 DIAGNOSIS — D473 Essential (hemorrhagic) thrombocythemia: Secondary | ICD-10-CM

## 2013-12-05 DIAGNOSIS — Z2821 Immunization not carried out because of patient refusal: Secondary | ICD-10-CM

## 2013-12-05 DIAGNOSIS — D649 Anemia, unspecified: Secondary | ICD-10-CM

## 2013-12-05 DIAGNOSIS — I10 Essential (primary) hypertension: Secondary | ICD-10-CM

## 2013-12-05 NOTE — Assessment & Plan Note (Signed)
First again get a colonoscopy has never had this has anemia and under treatment for thrombocythemia.

## 2013-12-05 NOTE — Progress Notes (Signed)
Pre visit review using our clinic review tool, if applicable. No additional management support is needed unless otherwise documented below in the visit note.  Chief Complaint  Patient presents with  . Annual Exam    HPI: Patient comes in today for Preventive Health Care visit  No major injuries or other change in health  Since her last visit overdue  she has been followed by endocrinology for type 2 diabetes well-controlled with dietThrombocythemia and recently anemia by Dr. Marin Olp On no active bruising bleeding clotting  She still hasn't gotten her colonoscopy but states that she's going to do it by the end of the year.   BP :  Checking 147 range  Over 86     stress recently working long hours no time to do her regular walking and exercise as much as in the past. No chest pain shortness of breath. Taking by bystolic amlodipine and lisinopril.  Health Maintenance  Topic Date Due  . Pneumococcal Polysaccharide Vaccine (##1) 05/07/1961  . Foot Exam  05/07/1961  . Zostavax  05/07/1961  . Influenza Vaccine  11/01/2014 (Originally 09/30/2013)  . Ophthalmology Exam  12/26/2013  . Hemoglobin A1c  05/29/2014  . Mammogram  11/16/2014  . Urine Microalbumin  11/29/2014  . Pap Smear  09/01/2015  . Colonoscopy  02/26/2021  . Tetanus/tdap  02/26/2021   Health Maintenance Review LIFESTYLE:  Exercise:   Less cause of job   Job stress no physical limitations Tobacco/ETS: no  Alcohol: none  Sugar beverages: tea pepsi 3 x per week  Sleep: 5-6 hours  Drug use: no Colonoscopy: going to get one  Before end of year.  Neg fam hx  Mammo: due   ROS:  GEN/ HEENT: No fever, significant weight changes sweats headaches vision problems hearing changes, CV/ PULM; No chest pain shortness of breath cough, syncope,edema  change in exercise tolerance. GI /GU: No adominal pain, vomiting, change in bowel habits. No blood in the stool. No significant GU symptoms. SKIN/HEME: ,no acute skin rashes suspicious  lesions or bleeding. No lymphadenopathy, nodules, masses.  NEURO/ PSYCH:  No neurologic signs such as weakness numbness. No depression anxiety. IMM/ Allergy: No unusual infections.  Allergy .   REST of 12 system review negative except as per HPI   Past Medical History  Diagnosis Date  . Hypertension     echo   . Thrombocytosis   . Diabetes mellitus without complication   . Iron deficiency anemia, unspecified 03/15/2013    Family History  Problem Relation Age of Onset  . Heart disease Mother     died age 76  . Heart attack Father 74    massive  . Hypertension Sister   . Hypertension Sister   . Hypertension Sister   . Hypertension Sister   . Arthritis      family hx  . Diabetes      family hx  . Kidney disease Mother     family hx  . Stroke       dialysis    History   Social History  . Marital Status: Married    Spouse Name: N/A    Number of Children: N/A  . Years of Education: N/A   Social History Main Topics  . Smoking status: Never Smoker   . Smokeless tobacco: Never Used     Comment: never used tobacco  . Alcohol Use: No  . Drug Use: No  . Sexual Activity: None   Other Topics Concern  . 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   No tobacco no ethoh little caffiene .     Outpatient Encounter Prescriptions as of 12/05/2013  Medication Sig  . amLODipine (NORVASC) 5 MG tablet TAKE ONE TABLET BY MOUTH ONCE DAILY  . anagrelide (AGRYLIN) 1 MG capsule TAKE 5 CAPSULES BY MOUTH DAILY  . aspirin 81 MG tablet Take 81 mg by mouth daily.  . Blood Glucose Monitoring Suppl (ONE TOUCH ULTRA SYSTEM KIT) W/DEVICE KIT 1 kit daily. Pt is Pre-Diabetic  . ergocalciferol (VITAMIN D2) 50000 UNITS capsule Take 1 capsule (50,000 Units total) by mouth once a week.  Marland Kitchen lisinopril (PRINIVIL,ZESTRIL) 20 MG tablet TAKE TWO TABLETS BY MOUTH ONCE DAILY  . Nebivolol HCl (BYSTOLIC) 20 MG TABS Take 20 mg by mouth  daily with breakfast.  . ONE TOUCH ULTRA TEST test strip 1 each by Other route as needed.   . ONE TOUCH ULTRA TEST test strip CHECK SUGARS ONCE DAILY  . [DISCONTINUED] anagrelide (AGRYLIN) 1 MG capsule TAKE 6 CAPSULES BY MOUTH DAILY  . [DISCONTINUED] Fe Fum-FePoly-Vit C-Vit B3 (INTEGRA) 62.5-62.5-40-3 MG CAPS Take 1 capsule by mouth daily.  . [DISCONTINUED] amLODipine (NORVASC) 5 MG tablet Take 5 mg by mouth daily with breakfast.  . [DISCONTINUED] anagrelide (AGRYLIN) 1 MG capsule TAKE 5 CAPSULES BY MOUTH DAILY    EXAM:  BP 158/78  Temp(Src) 97.8 F (36.6 C) (Oral)  Ht '5\' 10"'  (1.778 m)  Wt 142 lb (64.411 kg)  BMI 20.37 kg/m2  Body mass index is 20.37 kg/(m^2).  Physical Exam: Vital signs reviewed WRU:EAVW is a well-developed well-nourished alert cooperative    who appearsr stated age in no acute distress.  HEENT: normocephalic atraumatic , Eyes: PERRL EOM's full, conjunctiva clear, Nares: paten,t no deformity discharge or tenderness., Ears: no deformity EAC's clear TMs with normal landmarks. Mouth: clear OP, no lesions, edema.  Moist mucous membranes. Dentition in adequate repair. NECK: supple without masses, thyromegaly or bruits. CHEST/PULM:  Clear to auscultation and percussion breath sounds equal no wheeze , rales or rhonchi. No chest wall deformities or tenderness. CV: PMI is nondisplaced, S1 S2 no gallops, murmurs, rubs. Peripheral pulses are full without delay.No JVD .  Breast: normal by inspection . No dimpling, discharge, masses, tenderness or discharge . ABDOMEN: Bowel sounds normal nontender  No guard or rebound, no hepato splenomegal no CVA tenderness.  No hernia. Extremtities:  No clubbing cyanosis or edema, no acute joint swelling or redness no focal atrophy NEURO:  Oriented x3, cranial nerves 3-12 appear to be intact, no obvious focal weakness,gait within normal limits no abnormal reflexes or asymmetrical SKIN: No acute rashes normal turgor, color, no bruising or  petechiae. some discoloration pigmentation anterior shins.  PSYCH: Oriented, good eye contact, no obvious depression anxiety, cognition and judgment appear normal. LN: no cervical axillary inguinal adenopathy: right inguinal area 1. 5  + cm node mobile non tender  Lab Results  Component Value Date   WBC 7.0 11/28/2013   HGB 9.8* 11/28/2013   HCT 30.4* 11/28/2013   PLT 536.0* 11/28/2013   GLUCOSE 91 11/28/2013   CHOL 200 11/28/2013   TRIG 43.0 11/28/2013   HDL 55.70 11/28/2013   LDLDIRECT 127.3 04/19/2012   LDLCALC 136* 11/28/2013   ALT 20 11/28/2013   AST 19 11/28/2013   NA 141 11/28/2013   K 3.5 11/28/2013   CL  108 11/28/2013   CREATININE 0.7 11/28/2013   BUN 17 11/28/2013   CO2 31 11/28/2013   TSH 1.39 11/28/2013   HGBA1C 6.6* 11/28/2013   MICROALBUR 1.4 11/28/2013   Reviewed laboratory tests with patient  urine shows 2+ leukocytes but she has no symptoms negative microalbumin elevation ASSESSMENT AND PLAN:  Discussed the following assessment and plan:  Visit for preventive health examination - Absolutely imperative for her to get her colonoscopy especially with her anemia. will review record  Essential thrombocythemia  Diabetes mellitus type 2, controlled, without complications - neg fam hx low body weight ls and control   Essential hypertension - Not a goal white coat effect see problem list  Influenza vaccination declined  Anemia, unspecified anemia type - review no sig low iron studies noted stil needs colonscopy! hematology to follow  Has no UTI symptoms so we'll not treat the pyuria noted on a screening UA. Patient Care Team: Burnis Medin, MD as PCP - General [name], MD as Attending Physician (Hematology and Oncology) Philemon Kingdom, MD as Consulting Physician (Internal Medicine) Patient Instructions  Get a colonoscopy! To make sure anemia is not related to a slow leak.  Bp is elevated today goal is below 140/90   Take blood pressure readings twice a day for 7- 10  days and then periodically .To ensure below 140/90   .Send in readings     If still elevated we can adjust the dosing of Bp medication  ( ie increase  Amlodipine to 10 mg or  Increase lisinopril to 40 mg )  bp was better last year.  Will send copy of labs to dr Marin Olp   ROV depending on bp readings 3 months.     Standley Brooking. Mariany Mackintosh M.D.   BP Readings from Last 3 Encounters:  12/05/13 158/78  10/11/13 154/82  08/09/13 158/86

## 2013-12-05 NOTE — Assessment & Plan Note (Signed)
Elevated today possibly white coat phenomenon review of readings over the last year at goal 2014 150 range in the last 3-6 months. Check readings send him in followup in 3 months consider increasing the amlodipine.

## 2013-12-05 NOTE — Patient Instructions (Addendum)
Get a colonoscopy! To make sure anemia is not related to a slow leak.  Bp is elevated today goal is below 140/90   Take blood pressure readings twice a day for 7- 10 days and then periodically .To ensure below 140/90   .Send in readings     If still elevated we can adjust the dosing of Bp medication  ( ie increase  Amlodipine to 10 mg or  Increase lisinopril to 40 mg )  bp was better last year.  Will send copy of labs to dr Marin Olp   ROV depending on bp readings 3 months.

## 2013-12-06 ENCOUNTER — Telehealth: Payer: Self-pay | Admitting: Internal Medicine

## 2013-12-06 NOTE — Telephone Encounter (Signed)
emmi emailed °

## 2013-12-11 ENCOUNTER — Other Ambulatory Visit: Payer: Self-pay | Admitting: Internal Medicine

## 2013-12-11 DIAGNOSIS — Z1231 Encounter for screening mammogram for malignant neoplasm of breast: Secondary | ICD-10-CM

## 2013-12-12 ENCOUNTER — Ambulatory Visit (HOSPITAL_COMMUNITY)
Admission: RE | Admit: 2013-12-12 | Discharge: 2013-12-12 | Disposition: A | Discharge status: Home / Self Care | Payer: BC Managed Care – PPO | Source: Ambulatory Visit | Attending: Internal Medicine | Admitting: Internal Medicine

## 2013-12-12 DIAGNOSIS — Z1231 Encounter for screening mammogram for malignant neoplasm of breast: Secondary | ICD-10-CM | POA: Diagnosis present

## 2013-12-13 ENCOUNTER — Other Ambulatory Visit (HOSPITAL_BASED_OUTPATIENT_CLINIC_OR_DEPARTMENT_OTHER): Payer: BC Managed Care – PPO

## 2013-12-13 DIAGNOSIS — D473 Essential (hemorrhagic) thrombocythemia: Secondary | ICD-10-CM

## 2013-12-13 DIAGNOSIS — D693 Immune thrombocytopenic purpura: Secondary | ICD-10-CM

## 2013-12-13 DIAGNOSIS — D509 Iron deficiency anemia, unspecified: Secondary | ICD-10-CM

## 2013-12-13 DIAGNOSIS — E559 Vitamin D deficiency, unspecified: Secondary | ICD-10-CM

## 2013-12-13 LAB — IRON AND TIBC CHCC
%SAT: 33 % (ref 21–57)
Iron: 64 ug/dL (ref 41–142)
TIBC: 196 ug/dL — ABNORMAL LOW (ref 236–444)
UIBC: 132 ug/dL (ref 120–384)

## 2013-12-13 LAB — CBC WITH DIFFERENTIAL/PLATELET
BASO%: 1.3 % (ref 0.0–2.0)
Basophils Absolute: 0.1 10*3/uL (ref 0.0–0.1)
EOS%: 1.8 % (ref 0.0–7.0)
Eosinophils Absolute: 0.1 10*3/uL (ref 0.0–0.5)
HCT: 30.1 % — ABNORMAL LOW (ref 34.8–46.6)
HGB: 9.6 g/dL — ABNORMAL LOW (ref 11.6–15.9)
LYMPH#: 1.5 10*3/uL (ref 0.9–3.3)
LYMPH%: 20.3 % (ref 14.0–49.7)
MCH: 24.9 pg — AB (ref 25.1–34.0)
MCHC: 31.8 g/dL (ref 31.5–36.0)
MCV: 78.3 fL — ABNORMAL LOW (ref 79.5–101.0)
MONO#: 0.8 10*3/uL (ref 0.1–0.9)
MONO%: 11.1 % (ref 0.0–14.0)
NEUT%: 65.5 % (ref 38.4–76.8)
NEUTROS ABS: 4.8 10*3/uL (ref 1.5–6.5)
Platelets: 525 10*3/uL — ABNORMAL HIGH (ref 145–400)
RBC: 3.85 10*6/uL (ref 3.70–5.45)
RDW: 18.4 % — AB (ref 11.2–14.5)
WBC: 7.4 10*3/uL (ref 3.9–10.3)

## 2013-12-13 LAB — FERRITIN CHCC: FERRITIN: 260 ng/mL (ref 9–269)

## 2013-12-18 ENCOUNTER — Other Ambulatory Visit: Payer: Self-pay | Admitting: Internal Medicine

## 2013-12-18 NOTE — Telephone Encounter (Signed)
Sent to the pharmacy by e-scribe. 

## 2014-01-01 ENCOUNTER — Other Ambulatory Visit: Payer: Self-pay | Admitting: Internal Medicine

## 2014-01-01 NOTE — Telephone Encounter (Signed)
Sent to the pharmacy by e-scribe. 

## 2014-01-03 ENCOUNTER — Other Ambulatory Visit: Payer: Self-pay | Admitting: Hematology & Oncology

## 2014-01-03 ENCOUNTER — Ambulatory Visit: Payer: BC Managed Care – PPO | Admitting: Hematology & Oncology

## 2014-01-03 ENCOUNTER — Other Ambulatory Visit: Payer: BC Managed Care – PPO | Admitting: Lab

## 2014-01-08 ENCOUNTER — Telehealth: Payer: Self-pay | Admitting: Hematology & Oncology

## 2014-01-08 NOTE — Telephone Encounter (Signed)
Pt called had to have early morning, she is aware of 11-19 appointment

## 2014-01-18 ENCOUNTER — Ambulatory Visit (HOSPITAL_BASED_OUTPATIENT_CLINIC_OR_DEPARTMENT_OTHER): Payer: BC Managed Care – PPO | Admitting: Hematology & Oncology

## 2014-01-18 ENCOUNTER — Other Ambulatory Visit (HOSPITAL_BASED_OUTPATIENT_CLINIC_OR_DEPARTMENT_OTHER): Payer: BC Managed Care – PPO | Admitting: Lab

## 2014-01-18 ENCOUNTER — Encounter: Payer: Self-pay | Admitting: Hematology & Oncology

## 2014-01-18 VITALS — BP 145/86 | HR 90 | Temp 98.0°F | Resp 14 | Ht 69.0 in | Wt 146.0 lb

## 2014-01-18 DIAGNOSIS — D509 Iron deficiency anemia, unspecified: Secondary | ICD-10-CM

## 2014-01-18 DIAGNOSIS — E559 Vitamin D deficiency, unspecified: Secondary | ICD-10-CM

## 2014-01-18 DIAGNOSIS — D473 Essential (hemorrhagic) thrombocythemia: Secondary | ICD-10-CM

## 2014-01-18 LAB — CBC WITH DIFFERENTIAL (CANCER CENTER ONLY)
BASO#: 0.1 10*3/uL (ref 0.0–0.2)
BASO%: 1.1 % (ref 0.0–2.0)
EOS%: 1.7 % (ref 0.0–7.0)
Eosinophils Absolute: 0.1 10*3/uL (ref 0.0–0.5)
HEMATOCRIT: 30.8 % — AB (ref 34.8–46.6)
HGB: 9.7 g/dL — ABNORMAL LOW (ref 11.6–15.9)
LYMPH#: 1.4 10*3/uL (ref 0.9–3.3)
LYMPH%: 22.3 % (ref 14.0–48.0)
MCH: 25.6 pg — AB (ref 26.0–34.0)
MCHC: 31.5 g/dL — AB (ref 32.0–36.0)
MCV: 81 fL (ref 81–101)
MONO#: 0.7 10*3/uL (ref 0.1–0.9)
MONO%: 11.1 % (ref 0.0–13.0)
NEUT#: 4 10*3/uL (ref 1.5–6.5)
NEUT%: 63.8 % (ref 39.6–80.0)
Platelets: 497 10*3/uL — ABNORMAL HIGH (ref 145–400)
RBC: 3.79 10*6/uL (ref 3.70–5.32)
RDW: 17.8 % — AB (ref 11.1–15.7)
WBC: 6.3 10*3/uL (ref 3.9–10.0)

## 2014-01-18 LAB — FERRITIN CHCC: Ferritin: 240 ng/ml (ref 9–269)

## 2014-01-18 LAB — IRON AND TIBC CHCC
%SAT: 37 % (ref 21–57)
Iron: 74 ug/dL (ref 41–142)
TIBC: 203 ug/dL — ABNORMAL LOW (ref 236–444)
UIBC: 128 ug/dL (ref 120–384)

## 2014-01-18 NOTE — Progress Notes (Signed)
Hematology and Oncology Follow Up Visit  Alexandra [name] [id number] [birth year] 63 y.o. 01/18/2014   Principle Diagnosis:   Anagrelide 5 mg by mouth daily  Aspirin 81 mg by mouth daily  Current Therapy:    Essential thrombocythemia-Calreticulin positive       Interim History:  [name] is back for followup. She continues to do well. We have been able to cut her anagrelide dose back. Her blood count has been maintaining itself at a nice level.  She's had no problems with toxicity from treatment. She's had no nausea or vomiting. There's been no leg swelling. She's had no rashes. There's been no fever. She's had no bleeding.  . When we check her iron studies with her last visit, her iron saturation was 33 %. Her total iron was 64. Her ferritin was 260.  She had a vitamin D level of 20,000 back in June. She currently is on 50,000 units of vitamin D a week.  She is still working. There's no fatigue.  Medications: Current outpatient prescriptions: amLODipine (NORVASC) 5 MG tablet, TAKE ONE TABLET BY MOUTH ONCE DAILY, Disp: 90 tablet, Rfl: 0;  anagrelide (AGRYLIN) 1 MG capsule, TAKE 5 CAPSULES BY MOUTH DAILY, Disp: , Rfl: ;  anagrelide (AGRYLIN) 1 MG capsule, TAKE 6 CAPSULES BY MOUTH DAILY, Disp: 180 capsule, Rfl: 0;  aspirin 81 MG tablet, Take 81 mg by mouth daily., Disp: , Rfl:  Blood Glucose Monitoring Suppl (ONE TOUCH ULTRA SYSTEM KIT) W/DEVICE KIT, 1 kit daily. Pt is Pre-Diabetic, Disp: , Rfl: ;  BYSTOLIC 10 MG tablet, TAKE 2 TABLETS BY MOUTH EVERY MORNING, Disp: 60 tablet, Rfl: 5;  ergocalciferol (VITAMIN D2) 50000 UNITS capsule, Take 1 capsule (50,000 Units total) by mouth once a week., Disp: 4 capsule, Rfl: 12;  lisinopril (PRINIVIL,ZESTRIL) 20 MG tablet, TAKE TWO TABLETS BY MOUTH ONCE DAILY, Disp: 60 tablet, Rfl: 2 Nebivolol HCl (BYSTOLIC) 20 MG TABS, Take 20 mg by mouth daily with breakfast., Disp: , Rfl: ;  ONE TOUCH ULTRA TEST test strip, 1 each by Other route as needed. , Disp:  , Rfl: ;  ONE TOUCH ULTRA TEST test strip, CHECK SUGARS ONCE DAILY, Disp: 100 each, Rfl: 0  Allergies: No Known Allergies  Past Medical History, Surgical history, Social history, and Family History were reviewed and updated.  Review of Systems: As above  Physical Exam:  vitals were not taken for this visit.  Well-developed and well-nourished African American female. Head and neck exam shows no ocular or oral lesions. There are no palpable cervical or supraclavicular lymph nodes. Lungs are clear. Cardiac exam regular rate and rhythm with no murmurs rubs or bruits. Abdomen is soft. She is good bowel sounds. There is no fluid wave. There is no palpable liver or spleen tip. Back exam shows no tenderness over the spine ribs or hips. Extremities shows no clubbing cyanosis or edema. Skin exam shows no erythema on the palms of her hand. She no ecchymoses or petechia. Neurological exam is nonfocal.  Lab Results  Component Value Date   WBC 6.3 01/18/2014   HGB 9.7* 01/18/2014   HCT 30.8* 01/18/2014   MCV 81 01/18/2014   PLT 497* 01/18/2014     Chemistry      Component Value Date/Time   NA 141 11/28/2013 0839   NA 141 10/05/2013 1227   NA 138 08/09/2013 0923   K 3.5 11/28/2013 0839   K 3.6 10/05/2013 1227   K 3.0* 08/09/2013 0923   CL 108  11/28/2013 0839   CL 102 08/09/2013 0923   CO2 31 11/28/2013 0839   CO2 28 10/05/2013 1227   CO2 30 08/09/2013 0923   BUN 17 11/28/2013 0839   BUN 11.6 10/05/2013 1227   BUN 14 08/09/2013 0923   CREATININE 0.7 11/28/2013 0839   CREATININE 0.7 10/05/2013 1227   CREATININE 0.7 08/09/2013 0923      Component Value Date/Time   CALCIUM 8.9 11/28/2013 0839   CALCIUM 9.1 10/05/2013 1227   CALCIUM 9.1 08/09/2013 0923   ALKPHOS 102 11/28/2013 0839   ALKPHOS 108 10/05/2013 1227   ALKPHOS 109* 08/09/2013 0923   AST 19 11/28/2013 0839   AST 15 10/05/2013 1227   AST 25 08/09/2013 0923   ALT 20 11/28/2013 0839   ALT 16 10/05/2013 1227   ALT 28  08/09/2013 0923   BILITOT 0.6 11/28/2013 0839   BILITOT 0.63 10/05/2013 1227   BILITOT 0.80 08/09/2013 0923         Impression and Plan: Alexandra [name] is 63 year old [name]. She has essential thrombocythemia. She actually is Calreticulin positive.  I will keep her anagrelide dose the same. Her plate count is normal but trending up just a little bit.  Overall, she is a little bit more anemic. She is not symptomatically with this..  I'll see her back in about 2-3 months.   [name], MD 11/19/20158:57 AM

## 2014-01-18 NOTE — Addendum Note (Signed)
Addended by: Trevor Mace on: 01/18/2014 09:15 AM   Modules accepted: Orders, Medications

## 2014-01-19 ENCOUNTER — Telehealth: Payer: Self-pay | Admitting: *Deleted

## 2014-01-19 DIAGNOSIS — D473 Essential (hemorrhagic) thrombocythemia: Secondary | ICD-10-CM

## 2014-01-19 NOTE — Telephone Encounter (Addendum)
Spoke with patient. She asked what her vitamin D level was. We did not draw this during her most recent visit. Order for vitamin D level added to her next appointment on March 14, 2014.   ----- Message from [name], MD sent at 01/18/2014  5:49 PM EST ----- Please call and let her know that the iron level is doing okay. Thanks. Laurey Arrow

## 2014-02-03 ENCOUNTER — Other Ambulatory Visit: Payer: Self-pay | Admitting: Internal Medicine

## 2014-02-05 NOTE — Telephone Encounter (Signed)
Sent to the pharmacy by e-scribe.  Pt is to return in Jan 2016 for bp check.  No appointment has been scheduled.  Will send a reminder by mail.

## 2014-02-14 ENCOUNTER — Other Ambulatory Visit: Payer: Self-pay | Admitting: Hematology & Oncology

## 2014-02-15 ENCOUNTER — Other Ambulatory Visit: Payer: Self-pay | Admitting: *Deleted

## 2014-03-13 ENCOUNTER — Telehealth: Payer: Self-pay | Admitting: Internal Medicine

## 2014-03-13 DIAGNOSIS — Z1211 Encounter for screening for malignant neoplasm of colon: Secondary | ICD-10-CM

## 2014-03-13 NOTE — Telephone Encounter (Signed)
Pls advise.  

## 2014-03-13 NOTE — Telephone Encounter (Signed)
Pt called to ask for a referral to have a colonoscopy and she ask that Dr Regis Bill recommend a doctor

## 2014-03-13 NOTE — Telephone Encounter (Signed)
Dr Olevia Perches  Dr Collene Mares Dr Henrene Pastor

## 2014-03-14 ENCOUNTER — Ambulatory Visit (HOSPITAL_BASED_OUTPATIENT_CLINIC_OR_DEPARTMENT_OTHER): Payer: BC Managed Care – PPO | Admitting: Family

## 2014-03-14 ENCOUNTER — Encounter: Payer: Self-pay | Admitting: Hematology & Oncology

## 2014-03-14 ENCOUNTER — Other Ambulatory Visit (HOSPITAL_BASED_OUTPATIENT_CLINIC_OR_DEPARTMENT_OTHER): Payer: BC Managed Care – PPO | Admitting: Lab

## 2014-03-14 DIAGNOSIS — D473 Essential (hemorrhagic) thrombocythemia: Secondary | ICD-10-CM

## 2014-03-14 LAB — COMPREHENSIVE METABOLIC PANEL
ALBUMIN: 3.8 g/dL (ref 3.5–5.2)
ALK PHOS: 97 U/L (ref 39–117)
ALT: 18 U/L (ref 0–35)
AST: 17 U/L (ref 0–37)
BILIRUBIN TOTAL: 0.6 mg/dL (ref 0.2–1.2)
BUN: 19 mg/dL (ref 6–23)
CALCIUM: 8.9 mg/dL (ref 8.4–10.5)
CO2: 26 mEq/L (ref 19–32)
Chloride: 105 mEq/L (ref 96–112)
Creatinine, Ser: 0.71 mg/dL (ref 0.50–1.10)
GLUCOSE: 103 mg/dL — AB (ref 70–99)
POTASSIUM: 3.3 meq/L — AB (ref 3.5–5.3)
Sodium: 140 mEq/L (ref 135–145)
Total Protein: 7.3 g/dL (ref 6.0–8.3)

## 2014-03-14 LAB — CBC WITH DIFFERENTIAL (CANCER CENTER ONLY)
BASO#: 0.1 10*3/uL (ref 0.0–0.2)
BASO%: 1 % (ref 0.0–2.0)
EOS%: 2.4 % (ref 0.0–7.0)
Eosinophils Absolute: 0.2 10*3/uL (ref 0.0–0.5)
HCT: 32.2 % — ABNORMAL LOW (ref 34.8–46.6)
HGB: 10.3 g/dL — ABNORMAL LOW (ref 11.6–15.9)
LYMPH#: 1.6 10*3/uL (ref 0.9–3.3)
LYMPH%: 25.3 % (ref 14.0–48.0)
MCH: 25.6 pg — ABNORMAL LOW (ref 26.0–34.0)
MCHC: 32 g/dL (ref 32.0–36.0)
MCV: 80 fL — AB (ref 81–101)
MONO#: 0.7 10*3/uL (ref 0.1–0.9)
MONO%: 10.7 % (ref 0.0–13.0)
NEUT%: 60.6 % (ref 39.6–80.0)
NEUTROS ABS: 3.7 10*3/uL (ref 1.5–6.5)
PLATELETS: 503 10*3/uL — AB (ref 145–400)
RBC: 4.03 10*6/uL (ref 3.70–5.32)
RDW: 17.7 % — AB (ref 11.1–15.7)
WBC: 6.2 10*3/uL (ref 3.9–10.0)

## 2014-03-14 LAB — IRON AND TIBC CHCC
%SAT: 34 % (ref 21–57)
IRON: 77 ug/dL (ref 41–142)
TIBC: 224 ug/dL — AB (ref 236–444)
UIBC: 147 ug/dL (ref 120–384)

## 2014-03-14 LAB — FERRITIN CHCC: Ferritin: 212 ng/ml (ref 9–269)

## 2014-03-14 LAB — CHCC SATELLITE - SMEAR

## 2014-03-14 NOTE — Telephone Encounter (Signed)
Referral placed and noted about physicians. Called and spoke with pt and pt is aware.

## 2014-03-14 NOTE — Progress Notes (Signed)
[facility]  [phone] [phone]  ID: Alexandra [name] OB: 08/30/1950 MR#: [id number] CSN#:[id number] Patient Care Team: Wanda K Panosh, MD as PCP - General Peter R Ennever, MD as Attending Physician (Hematology and Oncology) Cristina Gherghe, MD as Consulting Physician (Internal Medicine)  DIAGNOSIS: Essential thrombocythemia-Calreticulin positive  INTERVAL HISTORY: Alexandra [name] is here today for a follow-up. She is doing well and has had no issues since we last saw her. She is taking her aspirin and Angrelide daily. There has been no evidence of toxicity.  She has had no bleeding or pain.  She denies fever, chills, n/v, cough, rash, headache, dizziness, SOB, chest pain, palpitations, abdominal pain, constipation, diarrhea, blood in urine or stool.  No swelling, tenderness, numbness or tingling in her extremities.  Her appetite is good and she is staying hydrated. Her weight is stable at 144 lbs.  In November, her iron saturation was 37% and ferritin was 240. In June, she had a vitamin D level of 20,000 and is currently is on 50,000 units of vitamin D a week.  CURRENT TREATMENT: Anagrelide 5 mg by mouth daily Aspirin 81 mg by mouth daily  REVIEW OF SYSTEMS: All other 10 point review of systems is negative.   PAST MEDICAL HISTORY: Past Medical History  Diagnosis Date  . Hypertension     echo   . Thrombocytosis   . Diabetes mellitus without complication   . Iron deficiency anemia, unspecified 03/15/2013    PAST SURGICAL HISTORY: Past Surgical History  Procedure Laterality Date  . Ectopic pregnancy surgery  1984  . Bone marrow biopsy      FAMILY HISTORY Family History  Problem Relation Age of Onset  . Heart disease Mother     died age 64  . Heart attack Father 64    massive  . Hypertension Sister   . Hypertension Sister   . Hypertension Sister   . Hypertension Sister   . Arthritis      family hx  . Diabetes      family hx  . Kidney  disease Mother     family hx  . Stroke       dialysis    GYNECOLOGIC HISTORY:  No LMP recorded. Patient is postmenopausal.   SOCIAL HISTORY: History   Social History  . Marital Status: Married    Spouse Name: N/A    Number of Children: N/A  . Years of Education: N/A   Occupational History  . Not on file.   Social History Main Topics  . Smoking status: Never Smoker   . Smokeless tobacco: Never Used     Comment: never used tobacco  . Alcohol Use: No  . Drug Use: No  . Sexual Activity: Not on file   Other Topics Concern  . 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   No tobacco no ethoh little caffiene .     ADVANCED DIRECTIVES:  <no information>  HEALTH MAINTENANCE: History  Substance Use Topics  . Smoking status: Never Smoker   . Smokeless tobacco: Never Used     Comment: never used tobacco  . Alcohol Use: No   Colonoscopy: PAP: Bone density: Lipid panel:  No Known Allergies  Current Outpatient Prescriptions  Medication Sig Dispense Refill  . amLODipine (NORVASC) 5 MG tablet TAKE ONE TABLET BY MOUTH ONCE   DAILY 90 tablet 0  . anagrelide (AGRYLIN) 1 MG capsule TAKE 5 CAPSULES BY MOUTH DAILY    . aspirin 81 MG tablet Take 81 mg by mouth daily.    . Blood Glucose Monitoring Suppl (ONE TOUCH ULTRA SYSTEM KIT) W/DEVICE KIT 1 kit daily. Pt is Pre-Diabetic    . BYSTOLIC 10 MG tablet TAKE 2 TABLETS BY MOUTH EVERY MORNING 60 tablet 5  . ergocalciferol (VITAMIN D2) 50000 UNITS capsule Take 1 capsule (50,000 Units total) by mouth once a week. 4 capsule 12  . lisinopril (PRINIVIL,ZESTRIL) 20 MG tablet TAKE TWO TABLETS BY MOUTH ONCE DAILY 60 tablet 2  . ONE TOUCH ULTRA TEST test strip 1 each by Other route as needed.      No current facility-administered medications for this visit.    OBJECTIVE: Filed Vitals:   03/14/14 0915  Temp: 97.5 F (36.4 C)  Resp: 14    Filed Weights    03/14/14 0915  Weight: 144 lb (65.318 kg)   ECOG FS:0 - Asymptomatic Ocular: Sclerae unicteric, pupils equal, round and reactive to light Ear-nose-throat: Oropharynx clear, dentition fair Lymphatic: No cervical or supraclavicular adenopathy Lungs no rales or rhonchi, good excursion bilaterally Heart regular rate and rhythm, no murmur appreciated Abd soft, nontender, positive bowel sounds MSK no focal spinal tenderness, no joint edema Neuro: non-focal, well-oriented, appropriate affect Breasts: Deferred  LAB RESULTS: CMP     Component Value Date/Time   NA 141 11/28/2013 0839   NA 141 10/05/2013 1227   NA 138 08/09/2013 0923   K 3.5 11/28/2013 0839   K 3.6 10/05/2013 1227   K 3.0* 08/09/2013 0923   CL 108 11/28/2013 0839   CL 102 08/09/2013 0923   CO2 31 11/28/2013 0839   CO2 28 10/05/2013 1227   CO2 30 08/09/2013 0923   GLUCOSE 91 11/28/2013 0839   GLUCOSE 121 10/05/2013 1227   GLUCOSE 118 08/09/2013 0923   BUN 17 11/28/2013 0839   BUN 11.6 10/05/2013 1227   BUN 14 08/09/2013 0923   CREATININE 0.7 11/28/2013 0839   CREATININE 0.7 10/05/2013 1227   CREATININE 0.7 08/09/2013 0923   CALCIUM 8.9 11/28/2013 0839   CALCIUM 9.1 10/05/2013 1227   CALCIUM 9.1 08/09/2013 0923   PROT 6.9 11/28/2013 0839   PROT 7.0 10/05/2013 1227   PROT 8.0 08/09/2013 0923   ALBUMIN 3.7 11/28/2013 0839   ALBUMIN 3.5 10/05/2013 1227   AST 19 11/28/2013 0839   AST 15 10/05/2013 1227   AST 25 08/09/2013 0923   ALT 20 11/28/2013 0839   ALT 16 10/05/2013 1227   ALT 28 08/09/2013 0923   ALKPHOS 102 11/28/2013 0839   ALKPHOS 108 10/05/2013 1227   ALKPHOS 109* 08/09/2013 0923   BILITOT 0.6 11/28/2013 0839   BILITOT 0.63 10/05/2013 1227   BILITOT 0.80 08/09/2013 0923   GFRNONAA 128.76 02/18/2010 0837   GFRAA 111 08/11/2006 1058   INo results found for: SPEP, UPEP Lab Results  Component Value Date   WBC 6.2 03/14/2014   NEUTROABS 3.7 03/14/2014   HGB 10.3* 03/14/2014   HCT 32.2* 03/14/2014    MCV 80* 03/14/2014   PLT 503* 03/14/2014   No results found for: LABCA2 No components found for: LABCA125 No results for input(s): INR in the last 168 hours.  STUDIES: No results found.  ASSESSMENT/PLAN: [name] is 64 year old [name] with essential thrombocythemia. She is Calreticulin positive. She is asymptomatic at this time.  Her platelet count today is 503 today.  She will continue on her same dose of Angrelide and an aspirin a day.  We will see her back in 2 months for labs and follow-up.  She knows to call here with any questions or concerns and to go to the ED in the event of an emergency. We can certainly see her sooner if need be   [place],SARAH M, NP 03/14/2014 9:37 AM  

## 2014-03-16 LAB — VITAMIN D 25 HYDROXY (VIT D DEFICIENCY, FRACTURES): VIT D 25 HYDROXY: 33 ng/mL (ref 30–100)

## 2014-03-16 LAB — RETICULOCYTES (CHCC)
ABS RETIC: 34.2 10*3/uL (ref 19.0–186.0)
RBC.: 4.28 MIL/uL (ref 3.87–5.11)
Retic Ct Pct: 0.8 % (ref 0.4–2.3)

## 2014-03-16 LAB — LACTATE DEHYDROGENASE: LDH: 231 U/L (ref 94–250)

## 2014-03-16 LAB — ERYTHROPOIETIN: Erythropoietin: 36.9 m[IU]/mL — ABNORMAL HIGH (ref 2.6–18.5)

## 2014-03-21 ENCOUNTER — Encounter: Payer: Self-pay | Admitting: Nurse Practitioner

## 2014-03-21 ENCOUNTER — Ambulatory Visit (INDEPENDENT_AMBULATORY_CARE_PROVIDER_SITE_OTHER): Payer: BC Managed Care – PPO | Admitting: Nurse Practitioner

## 2014-03-21 VITALS — BP 160/94 | HR 91 | Ht 70.0 in | Wt 147.0 lb

## 2014-03-21 DIAGNOSIS — Z1211 Encounter for screening for malignant neoplasm of colon: Secondary | ICD-10-CM

## 2014-03-21 MED ORDER — MOVIPREP 100 G PO SOLR: 1.0000 | Freq: Once | ORAL | Status: DC

## 2014-03-21 NOTE — Progress Notes (Addendum)
HPI :   Patient is a 64 year old female referred for colon cancer screening. She is on Agrylin for thrombocythemia managed by Dr. Marin Olp. She has no GI symptoms. Specifically no abdominal pain, bowel changes, blood in stool or unusual weight loss.    Past Medical History  Diagnosis Date  . Hypertension     echo   . Thrombocytosis   . Diabetes mellitus without complication   . Iron deficiency anemia, unspecified 03/15/2013    Family History  Problem Relation Age of Onset  . Heart disease Mother     died age 63  . Heart attack Father 55    massive  . Hypertension Sister   . Hypertension Sister   . Hypertension Sister   . Hypertension Sister   . Arthritis      family hx  . Diabetes      family hx  . Kidney disease Mother     family hx  . Stroke       dialysis   History  Substance Use Topics  . Smoking status: Never Smoker   . Smokeless tobacco: Never Used     Comment: never used tobacco  . Alcohol Use: No   Current Outpatient Prescriptions  Medication Sig Dispense Refill  . amLODipine (NORVASC) 5 MG tablet TAKE ONE TABLET BY MOUTH ONCE DAILY 90 tablet 0  . anagrelide (AGRYLIN) 1 MG capsule TAKE 5 CAPSULES BY MOUTH DAILY    . aspirin 81 MG tablet Take 81 mg by mouth daily.    . Blood Glucose Monitoring Suppl (ONE TOUCH ULTRA SYSTEM KIT) W/DEVICE KIT 1 kit daily. Pt is Pre-Diabetic    . BYSTOLIC 10 MG tablet TAKE 2 TABLETS BY MOUTH EVERY MORNING 60 tablet 5  . ergocalciferol (VITAMIN D2) 50000 UNITS capsule Take 1 capsule (50,000 Units total) by mouth once a week. 4 capsule 12  . lisinopril (PRINIVIL,ZESTRIL) 20 MG tablet TAKE TWO TABLETS BY MOUTH ONCE DAILY 60 tablet 2  . ONE TOUCH ULTRA TEST test strip 1 each by Other route as needed.     Marland Kitchen MOVIPREP 100 G SOLR Take 1 kit (200 g total) by mouth once. 1 kit 0   No current facility-administered medications for this visit.   No Known Allergies  Review of Systems: All systems reviewed and negative except where  noted in HPI.   Physical Exam: BP 160/94 mmHg  Pulse 91  Ht $R'5\' 10"'Bj$  (1.778 m)  Wt 147 lb (66.679 kg)  BMI 21.09 kg/m2  SpO2 98% Constitutional: Pleasant,well-developed, black female in no acute distress. HEENT: Normocephalic and atraumatic. Conjunctivae are normal. No scleral icterus. Neck supple.  Cardiovascular: Normal rate, regular rhythm.  Pulmonary/chest: Effort normal and breath sounds normal. No wheezing, rales or rhonchi. Abdominal: Soft, nondistended, nontender. Bowel sounds active throughout. There are no masses palpable. No hepatomegaly. Extremities: no edema Lymphadenopathy: No cervical adenopathy noted. Neurological: Alert and oriented to person place and time. Skin: Skin is warm and dry. No rashes noted. Psychiatric: Normal mood and affect. Behavior is normal.   ASSESSMENT AND PLAN:  60. 64 year old female referred for colon cancer screening.  The risks, benefits, and alternatives to colonoscopy with possible biopsy and possible polypectomy were discussed with the patient and she consents to proceed.   2. Thrombocythemia, followed by Dr. Marin Olp. She is on Agrylin. We will contact Dr. Marin Olp about holding Agrylin for the colonoscopy.     Addendum: Reviewed and agree with initial management. Lajuan Lines Pyrtle,  MD

## 2014-03-21 NOTE — Patient Instructions (Signed)
You have been scheduled for a colonoscopy. Please follow written instructions given to you at your visit today.  Please pick up your prep kit at the pharmacy within the next 1-3 days. If you use inhalers (even only as needed), please bring them with you on the day of your procedure. Your physician has requested that you go to www.startemmi.com and enter the access code given to you at your visit today. This web site gives a general overview about your procedure. However, you should still follow specific instructions given to you by our office regarding your preparation for the procedure. CC: Shanon Ace MD

## 2014-03-22 ENCOUNTER — Other Ambulatory Visit: Payer: Self-pay | Admitting: Hematology & Oncology

## 2014-04-18 ENCOUNTER — Encounter: Payer: Self-pay | Admitting: Internal Medicine

## 2014-04-19 ENCOUNTER — Telehealth: Payer: Self-pay | Admitting: Family Medicine

## 2014-04-19 ENCOUNTER — Other Ambulatory Visit: Payer: Self-pay | Admitting: Internal Medicine

## 2014-04-19 NOTE — Telephone Encounter (Signed)
Sent to the pharmacy for 30 days.  Pt is past due for bp check.  Will send a message to scheduling to help her get an appt.

## 2014-04-19 NOTE — Telephone Encounter (Signed)
This patient is past due for her bp check.  Please help her to get on the schedule with [facility].  Thanks!

## 2014-04-20 NOTE — Telephone Encounter (Signed)
lmom for pt to cb

## 2014-04-23 ENCOUNTER — Telehealth: Payer: Self-pay

## 2014-04-23 NOTE — Telephone Encounter (Signed)
Anti coag letter resent

## 2014-04-23 NOTE — Telephone Encounter (Signed)
-----   Message from Kerr sent at 03/21/2014 11:21 AM EST ----- Waiting for anti coag response from Dr Marin Olp for 05/02/14 colon

## 2014-04-24 NOTE — Telephone Encounter (Signed)
lmom for pt to cb

## 2014-04-25 ENCOUNTER — Encounter: Payer: Self-pay | Admitting: *Deleted

## 2014-04-25 NOTE — Telephone Encounter (Signed)
Jamie with Dr Marin Olp called and asked to have the anti coag letter resent to [phone].  I refaxed to [employer].

## 2014-04-25 NOTE — Telephone Encounter (Signed)
lmom for pt to cb to sch appt

## 2014-04-26 NOTE — Telephone Encounter (Signed)
Pt aware that Dr Marin Olp ok'd Agrylin hold for 1 day prior to procedure.

## 2014-05-01 ENCOUNTER — Telehealth: Payer: Self-pay | Admitting: Internal Medicine

## 2014-05-01 NOTE — Telephone Encounter (Signed)
Error

## 2014-05-02 ENCOUNTER — Encounter: Payer: Self-pay | Admitting: Internal Medicine

## 2014-05-02 ENCOUNTER — Ambulatory Visit (AMBULATORY_SURGERY_CENTER): Payer: BC Managed Care – PPO | Admitting: Internal Medicine

## 2014-05-02 VITALS — BP 143/82 | HR 86 | Temp 97.9°F | Resp 23 | Ht 70.0 in | Wt 147.0 lb

## 2014-05-02 DIAGNOSIS — D120 Benign neoplasm of cecum: Secondary | ICD-10-CM

## 2014-05-02 DIAGNOSIS — D123 Benign neoplasm of transverse colon: Secondary | ICD-10-CM

## 2014-05-02 DIAGNOSIS — Z1211 Encounter for screening for malignant neoplasm of colon: Secondary | ICD-10-CM

## 2014-05-02 MED ORDER — SODIUM CHLORIDE 0.9 % IV SOLN
500.0000 mL | INTRAVENOUS | Status: DC
Start: 2014-05-02 — End: 2014-05-02

## 2014-05-02 NOTE — Op Note (Signed)
[facility]  [address] [place], [id number]   COLONOSCOPY PROCEDURE REPORT  PATIENT: Alexandra, [name]  MR#: [id number] BIRTHDATE: 01/07/1951 , 63  yrs. old GENDER: female ENDOSCOPIST: Jerene Bears, MD REFERRED UD:JSHFW Darnelle Going, M.D. PROCEDURE DATE:  05/02/2014 PROCEDURE:   Colonoscopy with cold biopsy polypectomy First Screening Colonoscopy - Avg.  risk and is 50 yrs.  old or older Yes.  Prior Negative Screening - Now for repeat screening. N/A  History of Adenoma - Now for follow-up colonoscopy & has been > or = to 3 yrs.  N/A  Polyps Removed Today? Yes. ASA CLASS:   Class II INDICATIONS:average risk patient for colon cancer and 1st colonoscopy. MEDICATIONS: Monitored anesthesia care and Propofol 290 mg IV  DESCRIPTION OF PROCEDURE:   After the risks benefits and alternatives of the procedure were thoroughly explained, informed consent was obtained.  The digital rectal exam revealed no rectal mass.   The LB [id number]  endoscope was introduced through the anus and advanced to the cecum, which was identified by both the appendix and ileocecal valve. No adverse events experienced. The quality of the prep was good, using MoviPrep  The instrument was then slowly withdrawn as the colon was fully examined.      COLON FINDINGS: Two sessile polyps ranging from 3 to 59mm in size were found at the cecum and hepatic flexure.  Polypectomies were performed with cold forceps.  The resection was complete, the polyp tissue was completely retrieved and sent to histology.   The examination was otherwise normal.  Retroflexed views revealed internal hemorrhoids. The time to cecum=3 minutes 38 seconds. Withdrawal time=11 minutes 11 seconds.  The scope was withdrawn and the procedure completed. COMPLICATIONS: There were no immediate complications.  ENDOSCOPIC IMPRESSION: 1.   Two sessile polyps ranging from 3 to 11mm in size were found at the cecum and hepatic  flexure; polypectomies were performed with cold forceps 2.   The examination was otherwise normal  RECOMMENDATIONS: 1.  Await pathology results 2.  If the polyps removed today are proven to be adenomatous (pre-cancerous) polyps, you will need a repeat colonoscopy in 5 years.  Otherwise you should continue to follow colorectal cancer screening guidelines for "routine risk" patients with colonoscopy in 10 years.  You will receive a letter within 1-2 weeks with the results of your biopsy as well as final recommendations.  Please call my office if you have not received a letter after 3 weeks.  eSigned:  Jerene Bears, MD 05/02/2014 2:05 PM     cc: The Patient and Burnis Medin, MD

## 2014-05-02 NOTE — Patient Instructions (Addendum)
YOU HAD AN ENDOSCOPIC PROCEDURE TODAY AT [facility]:   Refer to the procedure report that was given to you for any specific questions about what was found during the examination.  If the procedure report does not answer your questions, please call your gastroenterologist to clarify.  If you requested that your care partner not be given the details of your procedure findings, then the procedure report has been included in a sealed envelope for you to review at your convenience later.  YOU SHOULD EXPECT: Some feelings of bloating in the abdomen. Passage of more gas than usual.  Walking can help get rid of the air that was put into your GI tract during the procedure and reduce the bloating. If you had a lower endoscopy (such as a colonoscopy or flexible sigmoidoscopy) you may notice spotting of blood in your stool or on the toilet paper. If you underwent a bowel prep for your procedure, you may not have a normal bowel movement for a few days.  Please Note:  You might notice some irritation and congestion in your nose or some drainage.  This is from the oxygen used during your procedure.  There is no need for concern and it should clear up in a day or so.  SYMPTOMS TO REPORT IMMEDIATELY:   Following lower endoscopy (colonoscopy or flexible sigmoidoscopy):  Excessive amounts of blood in the stool  Significant tenderness or worsening of abdominal pains  Swelling of the abdomen that is new, acute  Fever of 100F or higher   A gastroenterologist can be reached at any hour by calling [phone].   DIET: Your first meal following the procedure should be a small meal and then it is ok to progress to your normal diet. Heavy or fried foods are harder to digest and may make you feel nauseous or bloated.  Likewise, meals heavy in dairy and vegetables can increase bloating.  Drink plenty of fluids but you should avoid alcoholic beverages for 24 hours.  ACTIVITY:  You should plan to take it  easy for the rest of today and you should NOT DRIVE or use heavy machinery until tomorrow (because of the sedation medicines used during the test).    FOLLOW UP: Our staff will call the number listed on your records the next business day following your procedure to check on you and address any questions or concerns that you may have regarding the information given to you following your procedure. If we do not reach you, we will leave a message.  However, if you are feeling well and you are not experiencing any problems, there is no need to return our call.  We will assume that you have returned to your regular daily activities without incident.  If any biopsies were taken you will be contacted by phone or by letter within the next 1-3 weeks.  Please call us at [phone] if you have not heard about the biopsies in 3 weeks.    SIGNATURES/CONFIDENTIALITY: You and/or your care partner have signed paperwork which will be entered into your electronic medical record.  These signatures attest to the fact that that the information above on your After Visit Summary has been reviewed and is understood.  Full responsibility of the confidentiality of this discharge information lies with you and/or your care-partner.     Handouts were given to your care partner on polyps, hemorrhoids,and a high fiber diet with liberal fluid intake. You might notice some irritation in your nose  or drainage.  This may cause feelings of congestion.  This is from the oxygen, which can be drying.  This is no cause for concern; this should clear up in a few days.  You may resume your current medications today.  Resume your [facility] tomorrow per Dr. Hilarie Fredrickson. Await biopsy results. Please call if any questions or concerns.

## 2014-05-02 NOTE — Progress Notes (Signed)
No problems noted in the recovery room. maw 

## 2014-05-02 NOTE — Progress Notes (Signed)
A/ox3, pleased with MAC, report to RN 

## 2014-05-02 NOTE — Progress Notes (Signed)
Called to room to assist during endoscopic procedure.  Patient ID and intended procedure confirmed with present staff. Received instructions for my participation in the procedure from the performing physician.  

## 2014-05-03 ENCOUNTER — Telehealth: Payer: Self-pay | Admitting: *Deleted

## 2014-05-03 NOTE — Telephone Encounter (Signed)
  Follow up Call-  Call back number 05/02/2014  Post procedure Call Back phone  # [phone]  Permission to leave phone message Yes     Patient questions:  Do you have a fever, pain , or abdominal swelling? No. Pain Score  0 *  Have you tolerated food without any problems? Yes.    Have you been able to return to your normal activities? Yes.    Do you have any questions about your discharge instructions: Diet   No. Medications  No. Follow up visit  No.  Do you have questions or concerns about your Care? No.  Actions: * If pain score is 4 or above: No action needed, pain <4.

## 2014-05-08 ENCOUNTER — Encounter: Payer: Self-pay | Admitting: Internal Medicine

## 2014-05-09 ENCOUNTER — Encounter: Payer: Self-pay | Admitting: Internal Medicine

## 2014-05-09 ENCOUNTER — Ambulatory Visit (INDEPENDENT_AMBULATORY_CARE_PROVIDER_SITE_OTHER): Payer: BC Managed Care – PPO | Admitting: Internal Medicine

## 2014-05-09 VITALS — BP 160/86 | Temp 98.1°F | Ht 70.0 in | Wt 148.5 lb

## 2014-05-09 DIAGNOSIS — I10 Essential (primary) hypertension: Secondary | ICD-10-CM

## 2014-05-09 DIAGNOSIS — E876 Hypokalemia: Secondary | ICD-10-CM

## 2014-05-09 DIAGNOSIS — E119 Type 2 diabetes mellitus without complications: Secondary | ICD-10-CM

## 2014-05-09 NOTE — Progress Notes (Signed)
Pre visit review using our clinic review tool, if applicable. No additional management support is needed unless otherwise documented below in the visit note.  Chief Complaint  Patient presents with  . Follow-up    HPI: Alexandra [name] 64 y.o. comes in today for follow-up of her hypertension medicine management. Blood pressure home is crept up some since she has not been exercising in the winter142- 147 80  but Doing well.  Taking 40 mg lisinopril 20 mg by systolic and 5 mg amlodipine. She has appointment next week with Dr. Marin Olp for her thrombocythemia.ROS: See pertinent positives and negatives per HPI. No cardiovascular pulmonary symptoms significant edema. She also has had type 2 diabetes extremely well controlled by diet and exercise. No eye findings or neuropathy.  Past Medical History  Diagnosis Date  . Hypertension     echo   . Thrombocytosis   . Diabetes mellitus without complication   . Iron deficiency anemia, unspecified 03/15/2013    Family History  Problem Relation Age of Onset  . Heart disease Mother     died age 32  . Heart attack Father 57    massive  . Hypertension Sister   . Hypertension Sister   . Hypertension Sister   . Hypertension Sister   . Arthritis      family hx  . Diabetes      family hx  . Kidney disease Mother     family hx  . Stroke       dialysis    History   Social History  . Marital Status: Married    Spouse Name: N/A  . Number of Children: N/A  . Years of Education: N/A   Social History Main Topics  . Smoking status: Never Smoker   . Smokeless tobacco: Never Used     Comment: never used tobacco  . Alcohol Use: No  . Drug Use: No  . Sexual Activity: Not on file   Other Topics Concern  . 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   No tobacco no ethoh little caffiene .     Outpatient Encounter Prescriptions as of 05/09/2014  Medication Sig  .  amLODipine (NORVASC) 5 MG tablet TAKE ONE TABLET BY MOUTH ONCE DAILY  . anagrelide (AGRYLIN) 1 MG capsule TAKE 5 CAPSULES BY MOUTH DAILY  . aspirin 81 MG tablet Take 81 mg by mouth daily.  . Blood Glucose Monitoring Suppl (ONE TOUCH ULTRA SYSTEM KIT) W/DEVICE KIT 1 kit daily. Pt is Pre-Diabetic  . BYSTOLIC 10 MG tablet TAKE 2 TABLETS BY MOUTH EVERY MORNING  . ergocalciferol (VITAMIN D2) 50000 UNITS capsule Take 1 capsule (50,000 Units total) by mouth once a week.  Marland Kitchen lisinopril (PRINIVIL,ZESTRIL) 20 MG tablet TAKE TWO TABLETS BY MOUTH ONCE DAILY  . ONE TOUCH ULTRA TEST test strip 1 each by Other route as needed.     EXAM:  BP 160/86 mmHg  Temp(Src) 98.1 F (36.7 C) (Oral)  Ht '5\' 10"'  (1.778 m)  Wt 148 lb 8 oz (67.359 kg)  BMI 21.31 kg/m2  Body mass index is 21.31 kg/(m^2). Repeat blood pressure was 155/80 right arm GENERAL: vitals reviewed and listed above, alert, oriented, appears well hydrated and in no acute distress HEENT: atraumatic, conjunctiva  clear, no obvious abnormalities on inspection of external nose and ears  NECK: no obvious masses  on inspection palpation  LUNGS: clear to auscultation bilaterally, no wheezes, rales or rhonchi, good air movement CV: HRRR, no clubbing cyanosis or  trace peripheral edema nl cap refill  MS: moves all extremities without noticeable focal  abnormality PSYCH: pleasant and cooperative, no obvious depression or anxiety Lab Results  Component Value Date   WBC 6.2 03/14/2014   HGB 10.3* 03/14/2014   HCT 32.2* 03/14/2014   PLT 503* 03/14/2014   GLUCOSE 103* 03/14/2014   CHOL 200 11/28/2013   TRIG 43.0 11/28/2013   HDL 55.70 11/28/2013   LDLDIRECT 127.3 04/19/2012   LDLCALC 136* 11/28/2013   ALT 18 03/14/2014   AST 17 03/14/2014   NA 140 03/14/2014   K 3.3* 03/14/2014   CL 105 03/14/2014   CREATININE 0.71 03/14/2014   BUN 19 03/14/2014   CO2 26 03/14/2014   TSH 1.39 11/28/2013   HGBA1C 6.6* 11/28/2013   MICROALBUR 1.4 11/28/2013    BP Readings from Last 3 Encounters:  05/09/14 160/86  05/02/14 143/82  03/21/14 160/94    ASSESSMENT AND PLAN:  Discussed the following assessment and plan:  Essential hypertension - With white coat effect , diuretic help readings in past but had significant hypokalemia. We review chart consider other options such as spironolactone versus in  Hypokalemia  Diabetes mellitus type 2, controlled, without complications Patient wants to get back into her exercise and get her blood pressure down like she did before which is acceptable consider increasing amlodipine consider secondary causes and/or trial of spironolactone. -Patient advised to return or notify health care team  if symptoms worsen ,persist or new concerns arise.  Patient Instructions  Since potassium still low . Consider. Increase amlodipine to 10 mg per day.  Also consider  Checking in to aldosterone levels  Some hormone blockers such as spironolactone can help potassium and control ./bp better  . Will ask Dr E to do chemistry ( BMP and  Hg a1c at your next blood tests.)  Intensify lifestyle interventions.      Standley Brooking. Darlyne Schmiesing M.D.

## 2014-05-09 NOTE — Patient Instructions (Signed)
Since potassium still low . Consider. Increase amlodipine to 10 mg per day.  Also consider  Checking in to aldosterone levels  Some hormone blockers such as spironolactone can help potassium and control ./bp better  . Will ask Dr E to do chemistry ( BMP and  Hg a1c at your next blood tests.)  Intensify lifestyle interventions.

## 2014-05-10 ENCOUNTER — Other Ambulatory Visit: Payer: Self-pay | Admitting: Hematology & Oncology

## 2014-05-10 DIAGNOSIS — E119 Type 2 diabetes mellitus without complications: Secondary | ICD-10-CM

## 2014-05-15 ENCOUNTER — Encounter: Payer: Self-pay | Admitting: Hematology & Oncology

## 2014-05-15 ENCOUNTER — Ambulatory Visit (HOSPITAL_BASED_OUTPATIENT_CLINIC_OR_DEPARTMENT_OTHER): Payer: BC Managed Care – PPO | Admitting: Hematology & Oncology

## 2014-05-15 ENCOUNTER — Other Ambulatory Visit (HOSPITAL_BASED_OUTPATIENT_CLINIC_OR_DEPARTMENT_OTHER): Payer: BC Managed Care – PPO | Admitting: Lab

## 2014-05-15 VITALS — BP 147/83 | HR 86 | Temp 97.9°F | Resp 14 | Ht 69.0 in | Wt 147.0 lb

## 2014-05-15 DIAGNOSIS — D649 Anemia, unspecified: Secondary | ICD-10-CM

## 2014-05-15 DIAGNOSIS — E119 Type 2 diabetes mellitus without complications: Secondary | ICD-10-CM

## 2014-05-15 DIAGNOSIS — D509 Iron deficiency anemia, unspecified: Secondary | ICD-10-CM

## 2014-05-15 DIAGNOSIS — D473 Essential (hemorrhagic) thrombocythemia: Secondary | ICD-10-CM

## 2014-05-15 LAB — CBC WITH DIFFERENTIAL (CANCER CENTER ONLY)
BASO#: 0 10*3/uL (ref 0.0–0.2)
BASO%: 0.5 % (ref 0.0–2.0)
EOS ABS: 0.1 10*3/uL (ref 0.0–0.5)
EOS%: 2.3 % (ref 0.0–7.0)
HCT: 32.5 % — ABNORMAL LOW (ref 34.8–46.6)
HGB: 10.3 g/dL — ABNORMAL LOW (ref 11.6–15.9)
LYMPH#: 1.5 10*3/uL (ref 0.9–3.3)
LYMPH%: 26.4 % (ref 14.0–48.0)
MCH: 25.2 pg — ABNORMAL LOW (ref 26.0–34.0)
MCHC: 31.7 g/dL — ABNORMAL LOW (ref 32.0–36.0)
MCV: 80 fL — ABNORMAL LOW (ref 81–101)
MONO#: 0.5 10*3/uL (ref 0.1–0.9)
MONO%: 8 % (ref 0.0–13.0)
NEUT%: 62.8 % (ref 39.6–80.0)
NEUTROS ABS: 3.5 10*3/uL (ref 1.5–6.5)
PLATELETS: 508 10*3/uL — AB (ref 145–400)
RBC: 4.08 10*6/uL (ref 3.70–5.32)
RDW: 18.3 % — AB (ref 11.1–15.7)
WBC: 5.6 10*3/uL (ref 3.9–10.0)

## 2014-05-15 LAB — CMP (CANCER CENTER ONLY)
ALK PHOS: 125 U/L — AB (ref 26–84)
ALT(SGPT): 24 U/L (ref 10–47)
AST: 23 U/L (ref 11–38)
Albumin: 3.5 g/dL (ref 3.3–5.5)
BILIRUBIN TOTAL: 0.9 mg/dL (ref 0.20–1.60)
BUN: 15 mg/dL (ref 7–22)
CALCIUM: 8.7 mg/dL (ref 8.0–10.3)
CO2: 30 mEq/L (ref 18–33)
CREATININE: 0.7 mg/dL (ref 0.6–1.2)
Chloride: 104 mEq/L (ref 98–108)
Glucose, Bld: 139 mg/dL — ABNORMAL HIGH (ref 73–118)
Potassium: 3.6 mEq/L (ref 3.3–4.7)
Sodium: 144 mEq/L (ref 128–145)
Total Protein: 8 g/dL (ref 6.4–8.1)

## 2014-05-15 LAB — FERRITIN CHCC: Ferritin: 245 ng/ml (ref 9–269)

## 2014-05-15 LAB — CHCC SATELLITE - SMEAR

## 2014-05-15 LAB — IRON AND TIBC CHCC
%SAT: 38 % (ref 21–57)
IRON: 88 ug/dL (ref 41–142)
TIBC: 234 ug/dL — ABNORMAL LOW (ref 236–444)
UIBC: 146 ug/dL (ref 120–384)

## 2014-05-15 NOTE — Progress Notes (Signed)
Hematology and Oncology Follow Up Visit  Alexandra [name] [id number] [birth year] 64 y.o. 05/15/2014   Principle Diagnosis:   Anagrelide 4 mg by mouth daily  Aspirin 81 mg by mouth daily  Current Therapy:    Essential thrombocythemia-Calreticulin positive       Interim History:  [name] is back for followup. She continues to do well. She had a colonoscopy done back a couple weeks ago. Everything looked okay. There were no polyps.  We will go ahead and cut her anagrelide dose down to 4 mg a day. I think we can do this.  Her iron studies have been doing all right. She's had no problems with bleeding. She's had no nausea or vomiting. She's had no rashes. She's had no leg swelling. She is trying to exercise. She is watching what she eats.  She still working full-time. She works at  [name]. It is there 125th year anniversary. She is involved with all the activities and planning. .  She had a vitamin D level of 20,000 back in January. It was 33. She is currently on 50,000 units a week. I would keep her on this dose.  Overall, her performance status is ECOG 0.  Medications:  Current outpatient prescriptions:  .  amLODipine (NORVASC) 5 MG tablet, TAKE ONE TABLET BY MOUTH ONCE DAILY, Disp: 90 tablet, Rfl: 0 .  anagrelide (AGRYLIN) 1 MG capsule, TAKE 5 CAPSULES BY MOUTH DAILY, Disp: , Rfl:  .  aspirin 81 MG tablet, Take 81 mg by mouth daily., Disp: , Rfl:  .  Blood Glucose Monitoring Suppl (ONE TOUCH ULTRA SYSTEM KIT) W/DEVICE KIT, 1 kit daily. Pt is Pre-Diabetic, Disp: , Rfl:  .  BYSTOLIC 10 MG tablet, TAKE 2 TABLETS BY MOUTH EVERY MORNING, Disp: 60 tablet, Rfl: 5 .  ergocalciferol (VITAMIN D2) 50000 UNITS capsule, Take 1 capsule (50,000 Units total) by mouth once a week., Disp: 4 capsule, Rfl: 12 .  lisinopril (PRINIVIL,ZESTRIL) 20 MG tablet, TAKE TWO TABLETS BY MOUTH ONCE DAILY, Disp: 60 tablet, Rfl: 0 .  ONE TOUCH ULTRA TEST test strip, 1 each by Other route as needed. , Disp:  , Rfl:   Allergies: No Known Allergies  Past Medical History, Surgical history, Social history, and Family History were reviewed and updated.  Review of Systems: As above  Physical Exam:  height is '5\' 9"'  (1.753 m) and weight is 147 lb (66.679 kg). Her oral temperature is 97.9 F (36.6 C). Her blood pressure is 147/83 and her pulse is 86. Her respiration is 14.   Well-developed and well-nourished African American female. Head and neck exam shows no ocular or oral lesions. There are no palpable cervical or supraclavicular lymph nodes. Lungs are clear. Cardiac exam regular rate and rhythm with no murmurs rubs or bruits. Abdomen is soft. She is good bowel sounds. There is no fluid wave. There is no palpable liver or spleen tip. Back exam shows no tenderness over the spine ribs or hips. Extremities shows no clubbing cyanosis or edema. Skin exam shows no erythema on the palms of her hand. She no ecchymoses or petechia. Neurological exam is nonfocal.  Lab Results  Component Value Date   WBC 5.6 05/15/2014   HGB 10.3* 05/15/2014   HCT 32.5* 05/15/2014   MCV 80* 05/15/2014   PLT 508* 05/15/2014     Chemistry      Component Value Date/Time   NA 144 05/15/2014 0954   NA 140 03/14/2014 0929   NA 141  10/05/2013 1227   K 3.6 05/15/2014 0954   K 3.3* 03/14/2014 0929   K 3.6 10/05/2013 1227   CL 104 05/15/2014 0954   CL 105 03/14/2014 0929   CO2 30 05/15/2014 0954   CO2 26 03/14/2014 0929   CO2 28 10/05/2013 1227   BUN 15 05/15/2014 0954   BUN 19 03/14/2014 0929   BUN 11.6 10/05/2013 1227   CREATININE 0.7 05/15/2014 0954   CREATININE 0.71 03/14/2014 0929   CREATININE 0.7 10/05/2013 1227      Component Value Date/Time   CALCIUM 8.7 05/15/2014 0954   CALCIUM 8.9 03/14/2014 0929   CALCIUM 9.1 10/05/2013 1227   ALKPHOS 125* 05/15/2014 0954   ALKPHOS 97 03/14/2014 0929   ALKPHOS 108 10/05/2013 1227   AST 23 05/15/2014 0954   AST 17 03/14/2014 0929   AST 15 10/05/2013 1227   ALT 24  05/15/2014 0954   ALT 18 03/14/2014 0929   ALT 16 10/05/2013 1227   BILITOT 0.90 05/15/2014 0954   BILITOT 0.6 03/14/2014 0929   BILITOT 0.63 10/05/2013 1227         Impression and Plan: Alexandra [name] is 64 year old [name]. She has essential thrombocythemia. She actually is Calreticulin positive.  I will adjust her anagrelide dose down to 4 mg a day.. Her platelet count is is holding steady.  Overall, she is a little bit more anemic. She is not symptomatically with this.Alexandra [name] check her iron studies.  I'll see her back in about 2 months.   [name], MD 3/15/201610:30 AM

## 2014-05-17 ENCOUNTER — Other Ambulatory Visit: Payer: Self-pay | Admitting: Family

## 2014-05-17 LAB — LACTATE DEHYDROGENASE: LDH: 238 U/L (ref 94–250)

## 2014-05-17 LAB — ERYTHROPOIETIN: ERYTHROPOIETIN: 54 m[IU]/mL — AB (ref 2.6–18.5)

## 2014-05-17 LAB — RETICULOCYTES (CHCC)
ABS Retic: 41.2 10*3/uL (ref 19.0–186.0)
RBC.: 4.12 MIL/uL (ref 3.87–5.11)
Retic Ct Pct: 1 % (ref 0.4–2.3)

## 2014-05-22 ENCOUNTER — Other Ambulatory Visit: Payer: Self-pay | Admitting: Internal Medicine

## 2014-05-22 NOTE — Telephone Encounter (Signed)
Sent to the pharmacy by e-scribe. 

## 2014-06-13 ENCOUNTER — Other Ambulatory Visit: Payer: Self-pay | Admitting: Hematology & Oncology

## 2014-07-18 ENCOUNTER — Telehealth: Payer: Self-pay | Admitting: *Deleted

## 2014-07-18 ENCOUNTER — Other Ambulatory Visit (HOSPITAL_BASED_OUTPATIENT_CLINIC_OR_DEPARTMENT_OTHER): Payer: BC Managed Care – PPO

## 2014-07-18 ENCOUNTER — Encounter: Payer: Self-pay | Admitting: Hematology & Oncology

## 2014-07-18 ENCOUNTER — Ambulatory Visit (HOSPITAL_BASED_OUTPATIENT_CLINIC_OR_DEPARTMENT_OTHER): Payer: BC Managed Care – PPO | Admitting: Hematology & Oncology

## 2014-07-18 VITALS — BP 155/90 | HR 79 | Temp 97.3°F | Resp 16 | Ht 69.0 in | Wt 153.0 lb

## 2014-07-18 DIAGNOSIS — D509 Iron deficiency anemia, unspecified: Secondary | ICD-10-CM

## 2014-07-18 DIAGNOSIS — D473 Essential (hemorrhagic) thrombocythemia: Secondary | ICD-10-CM | POA: Diagnosis not present

## 2014-07-18 DIAGNOSIS — E119 Type 2 diabetes mellitus without complications: Secondary | ICD-10-CM

## 2014-07-18 LAB — FERRITIN CHCC: Ferritin: 205 ng/ml (ref 9–269)

## 2014-07-18 LAB — CBC WITH DIFFERENTIAL (CANCER CENTER ONLY)
BASO#: 0.1 10*3/uL (ref 0.0–0.2)
BASO%: 0.8 % (ref 0.0–2.0)
EOS%: 2.9 % (ref 0.0–7.0)
Eosinophils Absolute: 0.2 10*3/uL (ref 0.0–0.5)
HEMATOCRIT: 30.6 % — AB (ref 34.8–46.6)
HEMOGLOBIN: 9.7 g/dL — AB (ref 11.6–15.9)
LYMPH#: 1.7 10*3/uL (ref 0.9–3.3)
LYMPH%: 25.7 % (ref 14.0–48.0)
MCH: 25.7 pg — ABNORMAL LOW (ref 26.0–34.0)
MCHC: 31.7 g/dL — ABNORMAL LOW (ref 32.0–36.0)
MCV: 81 fL (ref 81–101)
MONO#: 0.8 10*3/uL (ref 0.1–0.9)
MONO%: 12.4 % (ref 0.0–13.0)
NEUT#: 3.8 10*3/uL (ref 1.5–6.5)
NEUT%: 58.2 % (ref 39.6–80.0)
Platelets: 653 10*3/uL — ABNORMAL HIGH (ref 145–400)
RBC: 3.78 10*6/uL (ref 3.70–5.32)
RDW: 19.3 % — ABNORMAL HIGH (ref 11.1–15.7)
WBC: 6.5 10*3/uL (ref 3.9–10.0)

## 2014-07-18 LAB — IRON AND TIBC CHCC
%SAT: 35 % (ref 21–57)
Iron: 77 ug/dL (ref 41–142)
TIBC: 221 ug/dL — ABNORMAL LOW (ref 236–444)
UIBC: 144 ug/dL (ref 120–384)

## 2014-07-18 LAB — COMPREHENSIVE METABOLIC PANEL
ALBUMIN: 3.8 g/dL (ref 3.5–5.2)
ALT: 17 U/L (ref 0–35)
AST: 14 U/L (ref 0–37)
Alkaline Phosphatase: 126 U/L — ABNORMAL HIGH (ref 39–117)
BUN: 15 mg/dL (ref 6–23)
CO2: 28 meq/L (ref 19–32)
Calcium: 9.5 mg/dL (ref 8.4–10.5)
Chloride: 106 mEq/L (ref 96–112)
Creatinine, Ser: 0.73 mg/dL (ref 0.50–1.10)
GLUCOSE: 109 mg/dL — AB (ref 70–99)
Potassium: 4.4 mEq/L (ref 3.5–5.3)
SODIUM: 143 meq/L (ref 135–145)
Total Bilirubin: 0.6 mg/dL (ref 0.2–1.2)
Total Protein: 7.3 g/dL (ref 6.0–8.3)

## 2014-07-18 LAB — CHCC SATELLITE - SMEAR

## 2014-07-18 NOTE — Progress Notes (Signed)
Hematology and Oncology Follow Up Visit  Alexandra [name] [id number] [birth year] 64 y.o. 07/18/2014   Principle Diagnosis:   Anagrelide 4 mg by mouth daily  Aspirin 81 mg by mouth daily  Current Therapy:    Essential thrombocythemia-Calreticulin positive       Interim History:  [name] is back for followup. She continues to do well. She is done with the Spring semester at [employer]. 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    We now have her on anagrelide at 4 mg a year and she is tolerating that dose well.  She's had no problems with pain in the hands or feet. She's had no nausea vomiting. She's had no cough. She's had no rashes. She's had no weight loss or weight gain. She's had no change in bowel or bladder habits.  . Overall, her performance status is ECOG 0.  Medications:  Current outpatient prescriptions:  .  amLODipine (NORVASC) 5 MG tablet, TAKE ONE TABLET BY MOUTH ONCE DAILY, Disp: 90 tablet, Rfl: 0 .  anagrelide (AGRYLIN) 1 MG capsule, TAKE 6 CAPSULES BY MOUTH EVERY DAY (Patient taking differently: TAKE 4 CAPSULES BY MOUTH EVERY DAY), Disp: 180 capsule, Rfl: 0 .  aspirin 81 MG tablet, Take 81 mg by mouth daily., Disp: , Rfl:  .  Blood Glucose Monitoring Suppl (ONE TOUCH ULTRA SYSTEM KIT) W/DEVICE KIT, 1 kit daily. Pt is Pre-Diabetic, Disp: , Rfl:  .  BYSTOLIC 10 MG tablet, TAKE 2 TABLETS BY MOUTH EVERY MORNING, Disp: 60 tablet, Rfl: 5 .  ergocalciferol (VITAMIN D2) 50000 UNITS capsule, Take 1 capsule (50,000 Units total) by mouth once a week., Disp: 4 capsule, Rfl: 12 .  lisinopril (PRINIVIL,ZESTRIL) 20 MG tablet, TAKE TWO TABLETS BY MOUTH ONCE DAILY, Disp: 60 tablet, Rfl: 2 .  ONE TOUCH ULTRA TEST test strip, 1 each by Other route as needed. , Disp: , Rfl:   Allergies: No Known Allergies  Past Medical History, Surgical history, Social history, and Family History were reviewed and updated.  Review of Systems: As  above  Physical Exam:  height is '5\' 9"'  (1.753 m) and weight is 153 lb (69.4 kg). Her oral temperature is 97.3 F (36.3 C). Her blood pressure is 155/90 and her pulse is 79. Her respiration is 16.   Well-developed and well-nourished African American female. Head and neck exam shows no ocular or oral lesions. There are no palpable cervical or supraclavicular lymph nodes. Lungs are clear. Cardiac exam regular rate and rhythm with no murmurs rubs or bruits. Abdomen is soft. She is good bowel sounds. There is no fluid wave. There is no palpable liver or spleen tip. Back exam shows no tenderness over the spine ribs or hips. Extremities shows no clubbing cyanosis or edema. Skin exam shows no erythema on the palms of her hand. She no ecchymoses or petechia. Neurological exam is nonfocal.  Lab Results  Component Value Date   WBC 6.5 07/18/2014   HGB 9.7* 07/18/2014   HCT 30.6* 07/18/2014   MCV 81 07/18/2014   PLT 653* 07/18/2014     Chemistry      Component Value Date/Time   NA 144 05/15/2014 0954   NA 140 03/14/2014 0929   NA 141 10/05/2013 1227   K 3.6 05/15/2014 0954   K 3.3* 03/14/2014 0929   K 3.6 10/05/2013 1227   CL 104 05/15/2014 0954   CL 105 03/14/2014 0929   CO2 30 05/15/2014 0954  CO2 26 03/14/2014 0929   CO2 28 10/05/2013 1227   BUN 15 05/15/2014 0954   BUN 19 03/14/2014 0929   BUN 11.6 10/05/2013 1227   CREATININE 0.7 05/15/2014 0954   CREATININE 0.71 03/14/2014 0929   CREATININE 0.7 10/05/2013 1227      Component Value Date/Time   CALCIUM 8.7 05/15/2014 0954   CALCIUM 8.9 03/14/2014 0929   CALCIUM 9.1 10/05/2013 1227   ALKPHOS 125* 05/15/2014 0954   ALKPHOS 97 03/14/2014 0929   ALKPHOS 108 10/05/2013 1227   AST 23 05/15/2014 0954   AST 17 03/14/2014 0929   AST 15 10/05/2013 1227   ALT 24 05/15/2014 0954   ALT 18 03/14/2014 0929   ALT 16 10/05/2013 1227   BILITOT 0.90 05/15/2014 0954   BILITOT 0.6 03/14/2014 0929   BILITOT 0.63 10/05/2013 1227          Impression and Plan: Alexandra [name] is 64 year old [name]. She has essential thrombocythemia. She actually is Calreticulin positive.  We will keep her on the 4 mg a day dose of anagrelide. I want to get her back in one month just for lab work and we will we will see what her platelet count is. If her platelet count does go up more, then we managed to get her back up to the 5 mg daily dose.  I'll see her back in about 2 months.   [name], MD 5/18/201612:36 PM

## 2014-07-18 NOTE — Telephone Encounter (Signed)
I have scheduled appts for [facility]. They will contact the patinet

## 2014-07-19 LAB — VITAMIN D 25 HYDROXY (VIT D DEFICIENCY, FRACTURES): VIT D 25 HYDROXY: 33 ng/mL (ref 30–100)

## 2014-07-19 LAB — LACTATE DEHYDROGENASE: LDH: 238 U/L (ref 94–250)

## 2014-07-31 ENCOUNTER — Other Ambulatory Visit: Payer: Self-pay | Admitting: *Deleted

## 2014-07-31 DIAGNOSIS — D473 Essential (hemorrhagic) thrombocythemia: Secondary | ICD-10-CM

## 2014-07-31 MED ORDER — ANAGRELIDE HCL 1 MG PO CAPS: ORAL_CAPSULE | ORAL | Status: DC

## 2014-08-02 ENCOUNTER — Other Ambulatory Visit: Payer: Self-pay | Admitting: Internal Medicine

## 2014-08-02 NOTE — Telephone Encounter (Signed)
Sent to the pharmacy by e-scribe.  Pt has upcoming appt on 08/09/14

## 2014-08-09 ENCOUNTER — Ambulatory Visit: Payer: BC Managed Care – PPO | Admitting: Internal Medicine

## 2014-08-15 ENCOUNTER — Other Ambulatory Visit (HOSPITAL_BASED_OUTPATIENT_CLINIC_OR_DEPARTMENT_OTHER): Payer: BC Managed Care – PPO

## 2014-08-15 DIAGNOSIS — E559 Vitamin D deficiency, unspecified: Secondary | ICD-10-CM

## 2014-08-15 DIAGNOSIS — D473 Essential (hemorrhagic) thrombocythemia: Secondary | ICD-10-CM

## 2014-08-15 DIAGNOSIS — D509 Iron deficiency anemia, unspecified: Secondary | ICD-10-CM

## 2014-08-15 DIAGNOSIS — E119 Type 2 diabetes mellitus without complications: Secondary | ICD-10-CM

## 2014-08-15 LAB — CBC WITH DIFFERENTIAL/PLATELET
BASO%: 0.8 % (ref 0.0–2.0)
Basophils Absolute: 0.1 10*3/uL (ref 0.0–0.1)
EOS%: 1.9 % (ref 0.0–7.0)
Eosinophils Absolute: 0.1 10*3/uL (ref 0.0–0.5)
HCT: 31 % — ABNORMAL LOW (ref 34.8–46.6)
HGB: 9.8 g/dL — ABNORMAL LOW (ref 11.6–15.9)
LYMPH%: 26.2 % (ref 14.0–49.7)
MCH: 25 pg — ABNORMAL LOW (ref 25.1–34.0)
MCHC: 31.6 g/dL (ref 31.5–36.0)
MCV: 79.1 fL — AB (ref 79.5–101.0)
MONO#: 0.6 10*3/uL (ref 0.1–0.9)
MONO%: 9.4 % (ref 0.0–14.0)
NEUT#: 4 10*3/uL (ref 1.5–6.5)
NEUT%: 61.7 % (ref 38.4–76.8)
NRBC: 0 % (ref 0–0)
Platelets: 401 10*3/uL — ABNORMAL HIGH (ref 145–400)
RBC: 3.92 10*6/uL (ref 3.70–5.45)
RDW: 18.8 % — AB (ref 11.2–14.5)
WBC: 6.5 10*3/uL (ref 3.9–10.3)
lymph#: 1.7 10*3/uL (ref 0.9–3.3)

## 2014-08-15 LAB — BASIC METABOLIC PANEL (CC13)
ANION GAP: 8 meq/L (ref 3–11)
BUN: 20.5 mg/dL (ref 7.0–26.0)
CO2: 28 mEq/L (ref 22–29)
Calcium: 8.9 mg/dL (ref 8.4–10.4)
Chloride: 107 mEq/L (ref 98–109)
Creatinine: 0.9 mg/dL (ref 0.6–1.1)
EGFR: 84 mL/min/{1.73_m2} — AB (ref >90)
GLUCOSE: 117 mg/dL (ref 70–140)
POTASSIUM: 4.1 meq/L (ref 3.5–5.1)
SODIUM: 142 meq/L (ref 136–145)

## 2014-08-15 LAB — HEMOGLOBIN A1C
HEMOGLOBIN A1C: 6.3 % — AB (ref <5.7)
Mean Plasma Glucose: 134 mg/dL — ABNORMAL HIGH (ref <117)

## 2014-08-15 LAB — FERRITIN CHCC: Ferritin: 225 ng/ml (ref 9–269)

## 2014-08-15 LAB — IRON AND TIBC CHCC
%SAT: 35 % (ref 21–57)
Iron: 72 ug/dL (ref 41–142)
TIBC: 206 ug/dL — ABNORMAL LOW (ref 236–444)
UIBC: 134 ug/dL (ref 120–384)

## 2014-08-28 ENCOUNTER — Other Ambulatory Visit: Payer: Self-pay | Admitting: Internal Medicine

## 2014-08-29 ENCOUNTER — Telehealth: Payer: Self-pay | Admitting: Family Medicine

## 2014-08-29 NOTE — Telephone Encounter (Signed)
Pt will callback and sch once she return from out of town

## 2014-08-29 NOTE — Telephone Encounter (Signed)
Pt is past due for a blood pressure check with [facility].  Please help her to make that appointment.  Thanks!

## 2014-08-29 NOTE — Telephone Encounter (Signed)
Sent to the pharmacy by e-scribe.  Pt is past due for bp follow up with [facility].  Message sent to scheduling.

## 2014-09-05 ENCOUNTER — Ambulatory Visit: Payer: BC Managed Care – PPO | Admitting: Hematology & Oncology

## 2014-09-05 ENCOUNTER — Other Ambulatory Visit: Payer: BC Managed Care – PPO

## 2014-09-24 ENCOUNTER — Other Ambulatory Visit: Payer: Self-pay | Admitting: *Deleted

## 2014-09-24 MED ORDER — NEBIVOLOL HCL 10 MG PO TABS: 20.0000 mg | ORAL_TABLET | Freq: Every morning | ORAL | Status: DC

## 2014-09-28 ENCOUNTER — Encounter: Payer: Self-pay | Admitting: Hematology & Oncology

## 2014-09-28 ENCOUNTER — Ambulatory Visit (HOSPITAL_BASED_OUTPATIENT_CLINIC_OR_DEPARTMENT_OTHER): Payer: BC Managed Care – PPO | Admitting: Hematology & Oncology

## 2014-09-28 ENCOUNTER — Other Ambulatory Visit (HOSPITAL_BASED_OUTPATIENT_CLINIC_OR_DEPARTMENT_OTHER): Payer: BC Managed Care – PPO

## 2014-09-28 VITALS — BP 142/80 | HR 79 | Temp 97.6°F | Resp 16 | Ht 69.0 in | Wt 151.0 lb

## 2014-09-28 DIAGNOSIS — D473 Essential (hemorrhagic) thrombocythemia: Secondary | ICD-10-CM | POA: Diagnosis not present

## 2014-09-28 DIAGNOSIS — D509 Iron deficiency anemia, unspecified: Secondary | ICD-10-CM

## 2014-09-28 DIAGNOSIS — E559 Vitamin D deficiency, unspecified: Secondary | ICD-10-CM

## 2014-09-28 LAB — CBC WITH DIFFERENTIAL (CANCER CENTER ONLY)
BASO#: 0 10*3/uL (ref 0.0–0.2)
BASO%: 0.7 % (ref 0.0–2.0)
EOS ABS: 0.1 10*3/uL (ref 0.0–0.5)
EOS%: 2.2 % (ref 0.0–7.0)
HEMATOCRIT: 29.8 % — AB (ref 34.8–46.6)
HEMOGLOBIN: 9.5 g/dL — AB (ref 11.6–15.9)
LYMPH#: 1.5 10*3/uL (ref 0.9–3.3)
LYMPH%: 24.8 % (ref 14.0–48.0)
MCH: 25.6 pg — AB (ref 26.0–34.0)
MCHC: 31.9 g/dL — ABNORMAL LOW (ref 32.0–36.0)
MCV: 80 fL — AB (ref 81–101)
MONO#: 0.7 10*3/uL (ref 0.1–0.9)
MONO%: 11.7 % (ref 0.0–13.0)
NEUT%: 60.6 % (ref 39.6–80.0)
NEUTROS ABS: 3.6 10*3/uL (ref 1.5–6.5)
PLATELETS: 655 10*3/uL — AB (ref 145–400)
RBC: 3.71 10*6/uL (ref 3.70–5.32)
RDW: 18.7 % — AB (ref 11.1–15.7)
WBC: 5.9 10*3/uL (ref 3.9–10.0)

## 2014-09-28 LAB — CMP (CANCER CENTER ONLY)
ALT: 22 U/L (ref 10–47)
AST: 25 U/L (ref 11–38)
Albumin: 3.5 g/dL (ref 3.3–5.5)
Alkaline Phosphatase: 107 U/L — ABNORMAL HIGH (ref 26–84)
BILIRUBIN TOTAL: 0.9 mg/dL (ref 0.20–1.60)
BUN, Bld: 15 mg/dL (ref 7–22)
CHLORIDE: 102 meq/L (ref 98–108)
CO2: 27 meq/L (ref 18–33)
Calcium: 9.2 mg/dL (ref 8.0–10.3)
Creat: 0.8 mg/dl (ref 0.6–1.2)
GLUCOSE: 107 mg/dL (ref 73–118)
Potassium: 3.5 mEq/L (ref 3.3–4.7)
Sodium: 139 mEq/L (ref 128–145)
Total Protein: 7.7 g/dL (ref 6.4–8.1)

## 2014-09-28 LAB — CHCC SATELLITE - SMEAR

## 2014-09-28 NOTE — Progress Notes (Signed)
Hematology and Oncology Follow Up Visit  Alexandra [name] [id number] 02/01/1951 64 y.o. 09/28/2014   Principle Diagnosis:   Anagrelide 4 mg by mouth daily  Aspirin 81 mg by mouth daily  Current Therapy:    Essential thrombocythemia-Calreticulin positive       Interim History:  Ms.  Alexandra [name] is back for followup. She continues to do well. She has had a very busy summer. She always present to self July for a college reunion. When she goes down, is typically over 100.  She works at [employer]. School starts back up in about 3 weeks. She will be quite busy until then.  She has had no problems with pain of the hands or feet. She's had no burning in her hands. She's had no headache.  Her appetite has been good. She's had no change in bowel or bladder habits.  She's had no leg swelling. Per his been no visual issues. She's had no headache.  . Overall, her performance status is ECOG 0.  Medications:  Current outpatient prescriptions:  .  amLODipine (NORVASC) 5 MG tablet, TAKE ONE TABLET BY MOUTH ONCE DAILY, Disp: 90 tablet, Rfl: 0 .  anagrelide (AGRYLIN) 1 MG capsule, TAKE 4 CAPSULES BY MOUTH EVERY DAY, Disp: 120 capsule, Rfl: 2 .  aspirin 81 MG tablet, Take 81 mg by mouth daily., Disp: , Rfl:  .  Blood Glucose Monitoring Suppl (ONE TOUCH ULTRA SYSTEM KIT) W/DEVICE KIT, 1 kit daily. Pt is Pre-Diabetic, Disp: , Rfl:  .  ergocalciferol (VITAMIN D2) 50000 UNITS capsule, Take 1 capsule (50,000 Units total) by mouth once a week., Disp: 4 capsule, Rfl: 12 .  lisinopril (PRINIVIL,ZESTRIL) 20 MG tablet, TAKE TWO TABLETS BY MOUTH ONCE DAILY, Disp: 60 tablet, Rfl: 2 .  nebivolol (BYSTOLIC) 10 MG tablet, Take 2 tablets (20 mg total) by mouth every morning., Disp: 60 tablet, Rfl: 2 .  ONE TOUCH ULTRA TEST test strip, 1 each by Other route as needed. , Disp: , Rfl:   Allergies: No Known Allergies  Past Medical History, Surgical history, Social history, and Family History were reviewed and  updated.  Review of Systems: As above  Physical Exam:  height is 5' 9" (1.753 m) and weight is 151 lb (68.493 kg). Her oral temperature is 97.6 F (36.4 C). Her blood pressure is 142/80 and her pulse is 79. Her respiration is 16.   Well-developed and well-nourished African American female. Head and neck exam shows no ocular or oral lesions. There are no palpable cervical or supraclavicular lymph nodes. Lungs are clear. Cardiac exam regular rate and rhythm with no murmurs rubs or bruits. Abdomen is soft. She is good bowel sounds. There is no fluid wave. There is no palpable liver or spleen tip. Back exam shows no tenderness over the spine ribs or hips. Extremities shows no clubbing cyanosis or edema. Skin exam shows no erythema on the palms of her hand. She no ecchymoses or petechia. Neurological exam is nonfocal.  Lab Results  Component Value Date   WBC 5.9 09/28/2014   HGB 9.5* 09/28/2014   HCT 29.8* 09/28/2014   MCV 80* 09/28/2014   PLT 655* 09/28/2014     Chemistry      Component Value Date/Time   NA 139 09/28/2014 1133   NA 142 08/15/2014 1106   NA 143 07/18/2014 1054   K 3.5 09/28/2014 1133   K 4.1 08/15/2014 1106   K 4.4 07/18/2014 1054   CL 102 09/28/2014 1133     CL 106 07/18/2014 1054   CO2 27 09/28/2014 1133   CO2 28 08/15/2014 1106   CO2 28 07/18/2014 1054   BUN 15 09/28/2014 1133   BUN 20.5 08/15/2014 1106   BUN 15 07/18/2014 1054   CREATININE 0.8 09/28/2014 1133   CREATININE 0.9 08/15/2014 1106   CREATININE 0.73 07/18/2014 1054      Component Value Date/Time   CALCIUM 9.2 09/28/2014 1133   CALCIUM 8.9 08/15/2014 1106   CALCIUM 9.5 07/18/2014 1054   ALKPHOS 107* 09/28/2014 1133   ALKPHOS 126* 07/18/2014 1054   ALKPHOS 108 10/05/2013 1227   AST 25 09/28/2014 1133   AST 14 07/18/2014 1054   AST 15 10/05/2013 1227   ALT 22 09/28/2014 1133   ALT 17 07/18/2014 1054   ALT 16 10/05/2013 1227   BILITOT 0.90 09/28/2014 1133   BILITOT 0.6 07/18/2014 1054    BILITOT 0.63 10/05/2013 1227         Impression and Plan: Alexandra [name] is 64-year-old African [place] female. She has essential thrombocythemia. She actually is Calreticulin positive.  We will keep her on the 4 mg a day dose of anagrelide. I want to get her back in one month just for lab work and we will we will see what her platelet count is. If her platelet count does go up more, then we will have to get her back up to the 5 mg daily dose.  I'll see her back in about 2 months.   ENNEVER,PETER R, MD 7/29/20161:43 PM 

## 2014-10-01 ENCOUNTER — Telehealth: Payer: Self-pay | Admitting: *Deleted

## 2014-10-01 LAB — IRON AND TIBC CHCC
%SAT: 36 % (ref 21–57)
Iron: 76 ug/dL (ref 41–142)
TIBC: 213 ug/dL — ABNORMAL LOW (ref 236–444)
UIBC: 136 ug/dL (ref 120–384)

## 2014-10-01 LAB — FERRITIN CHCC: Ferritin: 206 ng/ml (ref 9–269)

## 2014-10-01 NOTE — Telephone Encounter (Addendum)
Message left on personal voice mail.  ----- Message from [name], MD sent at 10/01/2014 10:22 AM EDT ----- Please call and tell her that the iron level is okay. Thank you

## 2014-10-26 ENCOUNTER — Other Ambulatory Visit (HOSPITAL_BASED_OUTPATIENT_CLINIC_OR_DEPARTMENT_OTHER): Payer: BC Managed Care – PPO

## 2014-10-26 ENCOUNTER — Other Ambulatory Visit: Payer: Self-pay | Admitting: Hematology & Oncology

## 2014-10-26 DIAGNOSIS — D473 Essential (hemorrhagic) thrombocythemia: Secondary | ICD-10-CM

## 2014-10-26 DIAGNOSIS — D509 Iron deficiency anemia, unspecified: Secondary | ICD-10-CM

## 2014-10-26 LAB — CBC & DIFF AND RETIC
BASO%: 0.5 % (ref 0.0–2.0)
BASOS ABS: 0 10*3/uL (ref 0.0–0.1)
EOS ABS: 0.1 10*3/uL (ref 0.0–0.5)
EOS%: 1.8 % (ref 0.0–7.0)
HEMATOCRIT: 30.2 % — AB (ref 34.8–46.6)
HEMOGLOBIN: 9.6 g/dL — AB (ref 11.6–15.9)
IMMATURE RETIC FRACT: 9.8 % (ref 1.60–10.00)
LYMPH#: 1.5 10*3/uL (ref 0.9–3.3)
LYMPH%: 26 % (ref 14.0–49.7)
MCH: 25.1 pg (ref 25.1–34.0)
MCHC: 31.8 g/dL (ref 31.5–36.0)
MCV: 79.1 fL — ABNORMAL LOW (ref 79.5–101.0)
MONO#: 0.6 10*3/uL (ref 0.1–0.9)
MONO%: 10.4 % (ref 0.0–14.0)
NEUT#: 3.5 10*3/uL (ref 1.5–6.5)
NEUT%: 61.3 % (ref 38.4–76.8)
PLATELETS: 432 10*3/uL — AB (ref 145–400)
RBC: 3.82 10*6/uL (ref 3.70–5.45)
RDW: 19 % — ABNORMAL HIGH (ref 11.2–14.5)
RETIC %: 1.09 % (ref 0.70–2.10)
RETIC CT ABS: 41.64 10*3/uL (ref 33.70–90.70)
WBC: 5.7 10*3/uL (ref 3.9–10.3)
nRBC: 0 % (ref 0–0)

## 2014-10-26 LAB — IRON AND TIBC CHCC
%SAT: 37 % (ref 21–57)
Iron: 78 ug/dL (ref 41–142)
TIBC: 211 ug/dL — AB (ref 236–444)
UIBC: 133 ug/dL (ref 120–384)

## 2014-10-26 LAB — COMPREHENSIVE METABOLIC PANEL (CC13)
ALBUMIN: 3.8 g/dL (ref 3.5–5.0)
ALK PHOS: 139 U/L (ref 40–150)
ALT: 26 U/L (ref 0–55)
AST: 21 U/L (ref 5–34)
Anion Gap: 6 mEq/L (ref 3–11)
BUN: 15.8 mg/dL (ref 7.0–26.0)
CHLORIDE: 107 meq/L (ref 98–109)
CO2: 28 meq/L (ref 22–29)
Calcium: 9.3 mg/dL (ref 8.4–10.4)
Creatinine: 0.8 mg/dL (ref 0.6–1.1)
EGFR: 85 mL/min/{1.73_m2} — ABNORMAL LOW (ref >90)
GLUCOSE: 106 mg/dL (ref 70–140)
POTASSIUM: 3.6 meq/L (ref 3.5–5.1)
SODIUM: 140 meq/L (ref 136–145)
Total Bilirubin: 0.53 mg/dL (ref 0.20–1.20)
Total Protein: 7.3 g/dL (ref 6.4–8.3)

## 2014-10-26 LAB — FERRITIN CHCC: FERRITIN: 257 ng/mL (ref 9–269)

## 2014-10-29 LAB — CHCC SATELLITE - SMEAR

## 2014-11-21 ENCOUNTER — Other Ambulatory Visit: Payer: Self-pay | Admitting: Hematology & Oncology

## 2014-11-27 ENCOUNTER — Other Ambulatory Visit: Payer: Self-pay | Admitting: Internal Medicine

## 2014-11-30 ENCOUNTER — Encounter: Payer: Self-pay | Admitting: Hematology & Oncology

## 2014-11-30 ENCOUNTER — Other Ambulatory Visit: Payer: Self-pay | Admitting: Hematology & Oncology

## 2014-11-30 ENCOUNTER — Telehealth: Payer: Self-pay | Admitting: *Deleted

## 2014-11-30 ENCOUNTER — Ambulatory Visit (HOSPITAL_BASED_OUTPATIENT_CLINIC_OR_DEPARTMENT_OTHER): Payer: BC Managed Care – PPO | Admitting: Hematology & Oncology

## 2014-11-30 ENCOUNTER — Other Ambulatory Visit (HOSPITAL_BASED_OUTPATIENT_CLINIC_OR_DEPARTMENT_OTHER): Payer: BC Managed Care – PPO

## 2014-11-30 VITALS — BP 141/76 | HR 92 | Temp 97.5°F | Resp 16 | Ht 69.0 in | Wt 151.0 lb

## 2014-11-30 DIAGNOSIS — D473 Essential (hemorrhagic) thrombocythemia: Secondary | ICD-10-CM | POA: Diagnosis not present

## 2014-11-30 LAB — CBC WITH DIFFERENTIAL (CANCER CENTER ONLY)
BASO#: 0.1 10*3/uL (ref 0.0–0.2)
BASO%: 0.9 % (ref 0.0–2.0)
EOS%: 1.7 % (ref 0.0–7.0)
Eosinophils Absolute: 0.1 10*3/uL (ref 0.0–0.5)
HCT: 29.7 % — ABNORMAL LOW (ref 34.8–46.6)
HEMOGLOBIN: 9.3 g/dL — AB (ref 11.6–15.9)
LYMPH#: 1.5 10*3/uL (ref 0.9–3.3)
LYMPH%: 26 % (ref 14.0–48.0)
MCH: 25.3 pg — ABNORMAL LOW (ref 26.0–34.0)
MCHC: 31.3 g/dL — AB (ref 32.0–36.0)
MCV: 81 fL (ref 81–101)
MONO#: 0.8 10*3/uL (ref 0.1–0.9)
MONO%: 13.4 % — AB (ref 0.0–13.0)
NEUT%: 58 % (ref 39.6–80.0)
NEUTROS ABS: 3.4 10*3/uL (ref 1.5–6.5)
Platelets: 528 10*3/uL — ABNORMAL HIGH (ref 145–400)
RBC: 3.68 10*6/uL — ABNORMAL LOW (ref 3.70–5.32)
RDW: 18.5 % — AB (ref 11.1–15.7)
WBC: 5.8 10*3/uL (ref 3.9–10.0)

## 2014-11-30 MED ORDER — ANAGRELIDE HCL 1 MG PO CAPS: ORAL_CAPSULE | ORAL | Status: DC

## 2014-11-30 NOTE — Telephone Encounter (Signed)
I have scheduled appt for lab in one month for the [facility]. The patient ws there at that office at the time appt was scheduled. She has appt date and time.

## 2014-11-30 NOTE — Progress Notes (Signed)
Hematology and Oncology Follow Up Visit  Alexandra [name] [id number] February 04, 1951 64 y.o. 11/30/2014   Principle Diagnosis:   Anagrelide 4 mg by mouth daily  Aspirin 81 mg by mouth daily  Current Therapy:    Essential thrombocythemia-Calreticulin positive       Interim History:  [name] is back for followup. She continues to do well. She feels well. She's had no complaints. There's no fatigue or weakness.  She's had no change in bowel or bladder habits.  Her appetite has been good. She's had no nausea or vomiting.  She's not noted any rashes. She's had no leg swelling.  Her iron studies done back in August showed a ferritin of 257 with iron saturation of 37%.  She continues to work full-time at [employer].  she really enjoys this.  She may go down to [place] this weekend for the [employer] football game.   Overall, her performance status is ECOG 0.  Medications:  Current outpatient prescriptions:  .  amLODipine (NORVASC) 5 MG tablet, TAKE ONE TABLET BY MOUTH ONCE DAILY, Disp: 90 tablet, Rfl: 0 .  anagrelide (AGRYLIN) 1 MG capsule, TAKE 4 CAPSULES BY MOUTH EVERY DAY, Disp: 120 capsule, Rfl: 0 .  aspirin 81 MG tablet, Take 81 mg by mouth daily., Disp: , Rfl:  .  Blood Glucose Monitoring Suppl (ONE TOUCH ULTRA SYSTEM KIT) W/DEVICE KIT, 1 kit daily. Pt is Pre-Diabetic, Disp: , Rfl:  .  lisinopril (PRINIVIL,ZESTRIL) 20 MG tablet, TAKE TWO TABLETS BY MOUTH ONCE DAILY, Disp: 60 tablet, Rfl: 0 .  nebivolol (BYSTOLIC) 10 MG tablet, Take 2 tablets (20 mg total) by mouth every morning., Disp: 60 tablet, Rfl: 2 .  ONE TOUCH ULTRA TEST test strip, 1 each by Other route as needed. , Disp: , Rfl:  .  Vitamin D, Ergocalciferol, (DRISDOL) 50000 UNITS CAPS capsule, TAKE ONE CAPSULE BY MOUTH ONCE A WEEK, Disp: 12 capsule, Rfl: 0  Allergies: No Known Allergies  Past Medical History, Surgical history, Social history, and Family History were reviewed and updated.  Review  of Systems: As above  Physical Exam:  height is 5' 9" (1.753 m) and weight is 151 lb (68.493 kg). Her oral temperature is 97.5 F (36.4 C). Her blood pressure is 141/76 and her pulse is 92. Her respiration is 16.   Well-developed and well-nourished African American female. Head and neck exam shows no ocular or oral lesions. There are no palpable cervical or supraclavicular lymph nodes. Lungs are clear. Cardiac exam regular rate and rhythm with no murmurs rubs or bruits. Abdomen is soft. She is good bowel sounds. There is no fluid wave. There is no palpable liver or spleen tip. Back exam shows no tenderness over the spine ribs or hips. Extremities shows no clubbing cyanosis or edema. Skin exam shows no erythema on the palms of her hand. She no ecchymoses or petechia. Neurological exam is nonfocal.  Lab Results  Component Value Date   WBC 5.8 11/30/2014   HGB 9.3* 11/30/2014   HCT 29.7* 11/30/2014   MCV 81 11/30/2014   PLT 528* 11/30/2014     Chemistry      Component Value Date/Time   NA 140 10/26/2014 1238   NA 139 09/28/2014 1133   NA 143 07/18/2014 1054   K 3.6 10/26/2014 1238   K 3.5 09/28/2014 1133   K 4.4 07/18/2014 1054   CL 102 09/28/2014 1133   CL 106 07/18/2014 1054   CO2 28 10/26/2014  1238   CO2 27 09/28/2014 1133   CO2 28 07/18/2014 1054   BUN 15.8 10/26/2014 1238   BUN 15 09/28/2014 1133   BUN 15 07/18/2014 1054   CREATININE 0.8 10/26/2014 1238   CREATININE 0.8 09/28/2014 1133   CREATININE 0.73 07/18/2014 1054      Component Value Date/Time   CALCIUM 9.3 10/26/2014 1238   CALCIUM 9.2 09/28/2014 1133   CALCIUM 9.5 07/18/2014 1054   ALKPHOS 139 10/26/2014 1238   ALKPHOS 107* 09/28/2014 1133   ALKPHOS 126* 07/18/2014 1054   AST 21 10/26/2014 1238   AST 25 09/28/2014 1133   AST 14 07/18/2014 1054   ALT 26 10/26/2014 1238   ALT 22 09/28/2014 1133   ALT 17 07/18/2014 1054   BILITOT 0.53 10/26/2014 1238   BILITOT 0.90 09/28/2014 1133   BILITOT 0.6 07/18/2014  1054         Impression and Plan: Alexandra [name] is 64 year old [name]. She has essential thrombocythemia. She actually is Calreticulin positive.  We will keep her on the 4 mg a day dose of anagrelide. I want to get her back in one month just for lab work and we will we will see what her platelet count is. If her platelet count does go up more, then we will have to get her back up to the 5 mg daily dose.  I'll see her back in about 2 months.   [name], MD 9/30/20161:19 PM

## 2014-12-03 ENCOUNTER — Telehealth: Payer: Self-pay | Admitting: *Deleted

## 2014-12-03 LAB — IRON AND TIBC CHCC
%SAT: 38 % (ref 21–57)
IRON: 78 ug/dL (ref 41–142)
TIBC: 204 ug/dL — AB (ref 236–444)
UIBC: 126 ug/dL (ref 120–384)

## 2014-12-03 LAB — FERRITIN CHCC: Ferritin: 228 ng/ml (ref 9–269)

## 2014-12-03 NOTE — Telephone Encounter (Addendum)
Left message on personal voice mail   ----- Message from [name], MD sent at 12/03/2014  1:39 PM EDT ----- Call - iron level is ok!!!  Laurey Arrow

## 2014-12-26 ENCOUNTER — Other Ambulatory Visit (HOSPITAL_BASED_OUTPATIENT_CLINIC_OR_DEPARTMENT_OTHER): Payer: BC Managed Care – PPO

## 2014-12-26 DIAGNOSIS — D473 Essential (hemorrhagic) thrombocythemia: Secondary | ICD-10-CM | POA: Diagnosis not present

## 2014-12-26 LAB — CBC WITH DIFFERENTIAL/PLATELET
BASO%: 1.3 % (ref 0.0–2.0)
BASOS ABS: 0.1 10*3/uL (ref 0.0–0.1)
EOS ABS: 0.1 10*3/uL (ref 0.0–0.5)
EOS%: 1.5 % (ref 0.0–7.0)
HCT: 28.6 % — ABNORMAL LOW (ref 34.8–46.6)
HGB: 9.1 g/dL — ABNORMAL LOW (ref 11.6–15.9)
LYMPH%: 22.8 % (ref 14.0–49.7)
MCH: 24.8 pg — ABNORMAL LOW (ref 25.1–34.0)
MCHC: 31.8 g/dL (ref 31.5–36.0)
MCV: 77.9 fL — AB (ref 79.5–101.0)
MONO#: 0.7 10*3/uL (ref 0.1–0.9)
MONO%: 12.4 % (ref 0.0–14.0)
NEUT#: 3.3 10*3/uL (ref 1.5–6.5)
NEUT%: 62 % (ref 38.4–76.8)
Platelets: 448 10*3/uL — ABNORMAL HIGH (ref 145–400)
RBC: 3.67 10*6/uL — AB (ref 3.70–5.45)
RDW: 19.3 % — ABNORMAL HIGH (ref 11.2–14.5)
WBC: 5.3 10*3/uL (ref 3.9–10.3)
lymph#: 1.2 10*3/uL (ref 0.9–3.3)

## 2014-12-27 ENCOUNTER — Other Ambulatory Visit: Payer: Self-pay | Admitting: Internal Medicine

## 2014-12-27 ENCOUNTER — Other Ambulatory Visit: Payer: Self-pay | Admitting: Hematology & Oncology

## 2014-12-27 NOTE — Telephone Encounter (Signed)
Schedule for cpx or follow up?

## 2014-12-28 NOTE — Telephone Encounter (Signed)
Refilled  And send in BP Readings from Last 3 Encounters:  11/30/14 141/76  09/28/14 142/80  07/18/14 155/90   advise Ov or cpx  before more refills

## 2015-01-28 ENCOUNTER — Other Ambulatory Visit: Payer: Self-pay | Admitting: Hematology & Oncology

## 2015-02-01 ENCOUNTER — Other Ambulatory Visit (HOSPITAL_BASED_OUTPATIENT_CLINIC_OR_DEPARTMENT_OTHER): Payer: BC Managed Care – PPO

## 2015-02-01 ENCOUNTER — Ambulatory Visit (HOSPITAL_BASED_OUTPATIENT_CLINIC_OR_DEPARTMENT_OTHER): Payer: BC Managed Care – PPO | Admitting: Hematology & Oncology

## 2015-02-01 VITALS — BP 157/90 | HR 74 | Temp 97.8°F | Resp 18 | Ht 69.0 in | Wt 154.1 lb

## 2015-02-01 DIAGNOSIS — D473 Essential (hemorrhagic) thrombocythemia: Secondary | ICD-10-CM | POA: Diagnosis not present

## 2015-02-01 LAB — COMPREHENSIVE METABOLIC PANEL
ALT: 18 U/L (ref 0–55)
ANION GAP: 9 meq/L (ref 3–11)
AST: 18 U/L (ref 5–34)
Albumin: 3.7 g/dL (ref 3.5–5.0)
Alkaline Phosphatase: 124 U/L (ref 40–150)
BILIRUBIN TOTAL: 0.51 mg/dL (ref 0.20–1.20)
BUN: 16.2 mg/dL (ref 7.0–26.0)
CO2: 27 mEq/L (ref 22–29)
CREATININE: 0.8 mg/dL (ref 0.6–1.1)
Calcium: 9.4 mg/dL (ref 8.4–10.4)
Chloride: 106 mEq/L (ref 98–109)
EGFR: >90 mL/min/{1.73_m2} (ref >90)
GLUCOSE: 132 mg/dL (ref 70–140)
Potassium: 3.7 mEq/L (ref 3.5–5.1)
SODIUM: 142 meq/L (ref 136–145)
TOTAL PROTEIN: 7.6 g/dL (ref 6.4–8.3)

## 2015-02-01 LAB — CBC WITH DIFFERENTIAL (CANCER CENTER ONLY)
BASO#: 0 10*3/uL (ref 0.0–0.2)
BASO%: 0.5 % (ref 0.0–2.0)
EOS ABS: 0.2 10*3/uL (ref 0.0–0.5)
EOS%: 2.5 % (ref 0.0–7.0)
HCT: 30.9 % — ABNORMAL LOW (ref 34.8–46.6)
HEMOGLOBIN: 9.7 g/dL — AB (ref 11.6–15.9)
LYMPH#: 1.3 10*3/uL (ref 0.9–3.3)
LYMPH%: 21.4 % (ref 14.0–48.0)
MCH: 25.4 pg — AB (ref 26.0–34.0)
MCHC: 31.4 g/dL — ABNORMAL LOW (ref 32.0–36.0)
MCV: 81 fL (ref 81–101)
MONO#: 0.8 10*3/uL (ref 0.1–0.9)
MONO%: 13.2 % — AB (ref 0.0–13.0)
NEUT%: 62.4 % (ref 39.6–80.0)
NEUTROS ABS: 3.7 10*3/uL (ref 1.5–6.5)
Platelets: 580 10*3/uL — ABNORMAL HIGH (ref 145–400)
RBC: 3.82 10*6/uL (ref 3.70–5.32)
RDW: 18.9 % — ABNORMAL HIGH (ref 11.1–15.7)
WBC: 6 10*3/uL (ref 3.9–10.0)

## 2015-02-01 LAB — CHCC SATELLITE - SMEAR

## 2015-02-01 NOTE — Progress Notes (Signed)
Hematology and Oncology Follow Up Visit  Alexandra [name] [id number] 1950-12-17 64 y.o. 02/01/2015   Principle Diagnosis:   Anagrelide 4 mg by mouth daily  Aspirin 81 mg by mouth daily  Current Therapy:    Essential thrombocythemia-Calreticulin positive       Interim History:  [name] is back for followup. She continues to do well. She feels well. She's had no complaints. There's no fatigue or weakness.  She had a good Thanksgiving. She works at [employer]. The semester is winding down.  She's had no change in bowel or bladder habits.  Her appetite has been good. She's had no nausea or vomiting.  She's not noted any rashes. She's had no leg swelling.  Her iron studies done back in September showed a ferritin of 228 with iron saturation of 38%.  She will be going down to [facility] for Christmas. She is looking forward to this.   Overall, her performance status is ECOG 0.  Medications:  Current outpatient prescriptions:  .  amLODipine (NORVASC) 5 MG tablet, TAKE ONE TABLET BY MOUTH ONCE DAILY, Disp: 90 tablet, Rfl: 0 .  anagrelide (AGRYLIN) 1 MG capsule, TAKE 4 CAPSULES BY MOUTH EVERY DAY, Disp: 120 capsule, Rfl: 3 .  anagrelide (AGRYLIN) 1 MG capsule, TAKE 4 CAPSULES BY MOUTH EVERY DAY, Disp: 120 capsule, Rfl: 0 .  aspirin 81 MG tablet, Take 81 mg by mouth daily., Disp: , Rfl:  .  Blood Glucose Monitoring Suppl (ONE TOUCH ULTRA SYSTEM KIT) W/DEVICE KIT, 1 kit daily. Pt is Pre-Diabetic, Disp: , Rfl:  .  BYSTOLIC 10 MG tablet, TAKE TWO TABLETS BY MOUTH ONCE DAILY IN THE MORNING, Disp: 60 tablet, Rfl: 5 .  lisinopril (PRINIVIL,ZESTRIL) 20 MG tablet, TAKE TWO TABLETS BY MOUTH ONCE DAILY, Disp: 60 tablet, Rfl: 5 .  ONE TOUCH ULTRA TEST test strip, 1 each by Other route as needed. , Disp: , Rfl:  .  Vitamin D, Ergocalciferol, (DRISDOL) 50000 UNITS CAPS capsule, TAKE ONE CAPSULE BY MOUTH ONCE A WEEK, Disp: 12 capsule, Rfl: 0  Allergies: No Known  Allergies  Past Medical History, Surgical history, Social history, and Family History were reviewed and updated.  Review of Systems: As above  Physical Exam:  height is _0  (1.753 m) and weight is 154 lb 1.9 oz (69.908 kg). Her oral temperature is 97.8 F (36.6 C). Her blood pressure is 166/89 and her pulse is 72. Her respiration is 18.   Well-developed and well-nourished African American female. Head and neck exam shows no ocular or oral lesions. There are no palpable cervical or supraclavicular lymph nodes. Lungs are clear. Cardiac exam regular rate and rhythm with no murmurs rubs or bruits. Abdomen is soft. She is good bowel sounds. There is no fluid wave. There is no palpable liver or spleen tip. Back exam shows no tenderness over the spine ribs or hips. Extremities shows no clubbing cyanosis or edema. Skin exam shows no erythema on the palms of her hand. She no ecchymoses or petechia. Neurological exam is nonfocal.  Lab Results  Component Value Date   WBC 6.0 02/01/2015   HGB 9.7* 02/01/2015   HCT 30.9* 02/01/2015   MCV 81 02/01/2015   PLT 580* 02/01/2015     Chemistry      Component Value Date/Time   NA 140 10/26/2014 1238   NA 139 09/28/2014 1133   NA 143 07/18/2014 1054   K 3.6 10/26/2014 1238   K 3.5 09/28/2014 1133  K 4.4 07/18/2014 1054   CL 102 09/28/2014 1133   CL 106 07/18/2014 1054   CO2 28 10/26/2014 1238   CO2 27 09/28/2014 1133   CO2 28 07/18/2014 1054   BUN 15.8 10/26/2014 1238   BUN 15 09/28/2014 1133   BUN 15 07/18/2014 1054   CREATININE 0.8 10/26/2014 1238   CREATININE 0.8 09/28/2014 1133   CREATININE 0.73 07/18/2014 1054      Component Value Date/Time   CALCIUM 9.3 10/26/2014 1238   CALCIUM 9.2 09/28/2014 1133   CALCIUM 9.5 07/18/2014 1054   ALKPHOS 139 10/26/2014 1238   ALKPHOS 107* 09/28/2014 1133   ALKPHOS 126* 07/18/2014 1054   AST 21 10/26/2014 1238   AST 25 09/28/2014 1133   AST 14 07/18/2014 1054   ALT 26 10/26/2014 1238   ALT 22  09/28/2014 1133   ALT 17 07/18/2014 1054   BILITOT 0.53 10/26/2014 1238   BILITOT 0.90 09/28/2014 1133   BILITOT 0.6 07/18/2014 1054         Impression and Plan: Alexandra [name] is 64 year old [name]. She has essential thrombocythemia. She actually is Calreticulin positive.  Even though her plate count is up a little bit, I still would not change her anagrelide dose. She is asymptomatic. She is doing well with the anagrelide without any toxicity.  I'll see her back in about 2 months. If all is good in 2 months, then maybe we can move her appointments to every 3 months.   [name], MD 12/2/20161:28 PM

## 2015-02-04 LAB — IRON AND TIBC
%SAT: 33 % (ref 21–57)
Iron: 73 ug/dL (ref 41–142)
TIBC: 224 ug/dL — AB (ref 236–444)
UIBC: 151 ug/dL (ref 120–384)

## 2015-02-04 LAB — FERRITIN: Ferritin: 178 ng/ml (ref 9–269)

## 2015-03-04 ENCOUNTER — Other Ambulatory Visit: Payer: Self-pay | Admitting: Hematology & Oncology

## 2015-03-06 ENCOUNTER — Other Ambulatory Visit: Payer: Self-pay | Admitting: *Deleted

## 2015-03-06 ENCOUNTER — Other Ambulatory Visit (HOSPITAL_BASED_OUTPATIENT_CLINIC_OR_DEPARTMENT_OTHER): Payer: BC Managed Care – PPO

## 2015-03-06 DIAGNOSIS — D473 Essential (hemorrhagic) thrombocythemia: Secondary | ICD-10-CM

## 2015-03-06 LAB — CBC WITH DIFFERENTIAL/PLATELET
BASO%: 0.7 % (ref 0.0–2.0)
Basophils Absolute: 0 10*3/uL (ref 0.0–0.1)
EOS%: 3.2 % (ref 0.0–7.0)
Eosinophils Absolute: 0.2 10*3/uL (ref 0.0–0.5)
HCT: 31.4 % — ABNORMAL LOW (ref 34.8–46.6)
HGB: 10.1 g/dL — ABNORMAL LOW (ref 11.6–15.9)
LYMPH%: 26 % (ref 14.0–49.7)
MCH: 25 pg — ABNORMAL LOW (ref 25.1–34.0)
MCHC: 32.1 g/dL (ref 31.5–36.0)
MCV: 78.1 fL — ABNORMAL LOW (ref 79.5–101.0)
MONO#: 0.6 10*3/uL (ref 0.1–0.9)
MONO%: 8.9 % (ref 0.0–14.0)
NEUT%: 61.2 % (ref 38.4–76.8)
NEUTROS ABS: 4 10*3/uL (ref 1.5–6.5)
Platelets: 685 10*3/uL — ABNORMAL HIGH (ref 145–400)
RBC: 4.02 10*6/uL (ref 3.70–5.45)
RDW: 19.5 % — ABNORMAL HIGH (ref 11.2–14.5)
WBC: 6.5 10*3/uL (ref 3.9–10.3)
lymph#: 1.7 10*3/uL (ref 0.9–3.3)

## 2015-03-14 ENCOUNTER — Other Ambulatory Visit: Payer: Self-pay | Admitting: Internal Medicine

## 2015-03-15 NOTE — Telephone Encounter (Signed)
Sent to the pharmacy for 30 days.  Pt is past due for an appt.

## 2015-03-27 ENCOUNTER — Other Ambulatory Visit: Payer: Self-pay | Admitting: Hematology & Oncology

## 2015-03-29 ENCOUNTER — Other Ambulatory Visit: Payer: Self-pay

## 2015-03-29 DIAGNOSIS — Z1231 Encounter for screening mammogram for malignant neoplasm of breast: Secondary | ICD-10-CM

## 2015-04-01 ENCOUNTER — Other Ambulatory Visit: Payer: Self-pay | Admitting: Hematology & Oncology

## 2015-04-01 ENCOUNTER — Telehealth: Payer: Self-pay | Admitting: Internal Medicine

## 2015-04-01 DIAGNOSIS — D509 Iron deficiency anemia, unspecified: Secondary | ICD-10-CM

## 2015-04-01 DIAGNOSIS — D473 Essential (hemorrhagic) thrombocythemia: Secondary | ICD-10-CM

## 2015-04-01 MED ORDER — ONETOUCH DELICA LANCETS 33G MISC: Status: DC

## 2015-04-01 MED ORDER — GLUCOSE BLOOD VI STRP: ORAL_STRIP | Status: DC

## 2015-04-01 NOTE — Telephone Encounter (Signed)
Refill sent to pt's pharmacy. 

## 2015-04-01 NOTE — Telephone Encounter (Signed)
Patient called stating that she would like a refill on her medication   Rx: Test strips and Lancets   Pharmacy: [place]   Thank you

## 2015-04-03 ENCOUNTER — Encounter: Payer: Self-pay | Admitting: Hematology & Oncology

## 2015-04-03 ENCOUNTER — Other Ambulatory Visit (HOSPITAL_BASED_OUTPATIENT_CLINIC_OR_DEPARTMENT_OTHER): Payer: BC Managed Care – PPO

## 2015-04-03 ENCOUNTER — Ambulatory Visit (HOSPITAL_BASED_OUTPATIENT_CLINIC_OR_DEPARTMENT_OTHER): Payer: BC Managed Care – PPO | Admitting: Hematology & Oncology

## 2015-04-03 VITALS — BP 166/78 | HR 86 | Temp 97.3°F | Resp 16 | Ht 69.0 in | Wt 153.0 lb

## 2015-04-03 DIAGNOSIS — C473 Malignant neoplasm of peripheral nerves of thorax: Secondary | ICD-10-CM

## 2015-04-03 DIAGNOSIS — D473 Essential (hemorrhagic) thrombocythemia: Secondary | ICD-10-CM

## 2015-04-03 LAB — CBC WITH DIFFERENTIAL (CANCER CENTER ONLY)
BASO#: 0.1 10*3/uL (ref 0.0–0.2)
BASO%: 0.7 % (ref 0.0–2.0)
EOS ABS: 0.1 10*3/uL (ref 0.0–0.5)
EOS%: 2 % (ref 0.0–7.0)
HEMATOCRIT: 32.5 % — AB (ref 34.8–46.6)
HEMOGLOBIN: 10.3 g/dL — AB (ref 11.6–15.9)
LYMPH#: 1.7 10*3/uL (ref 0.9–3.3)
LYMPH%: 25.3 % (ref 14.0–48.0)
MCH: 25.2 pg — AB (ref 26.0–34.0)
MCHC: 31.7 g/dL — ABNORMAL LOW (ref 32.0–36.0)
MCV: 80 fL — ABNORMAL LOW (ref 81–101)
MONO#: 0.7 10*3/uL (ref 0.1–0.9)
MONO%: 10.6 % (ref 0.0–13.0)
NEUT%: 61.4 % (ref 39.6–80.0)
NEUTROS ABS: 4.2 10*3/uL (ref 1.5–6.5)
Platelets: 657 10*3/uL — ABNORMAL HIGH (ref 145–400)
RBC: 4.08 10*6/uL (ref 3.70–5.32)
RDW: 18.6 % — ABNORMAL HIGH (ref 11.1–15.7)
WBC: 6.9 10*3/uL (ref 3.9–10.0)

## 2015-04-03 LAB — COMPREHENSIVE METABOLIC PANEL
ALBUMIN: 4 g/dL (ref 3.5–5.0)
ALT: 15 U/L (ref 0–55)
AST: 17 U/L (ref 5–34)
Alkaline Phosphatase: 117 U/L (ref 40–150)
Anion Gap: 11 mEq/L (ref 3–11)
BUN: 16.3 mg/dL (ref 7.0–26.0)
CALCIUM: 9.5 mg/dL (ref 8.4–10.4)
CHLORIDE: 103 meq/L (ref 98–109)
CO2: 28 mEq/L (ref 22–29)
CREATININE: 0.9 mg/dL (ref 0.6–1.1)
EGFR: 79 mL/min/{1.73_m2} — ABNORMAL LOW (ref >90)
GLUCOSE: 102 mg/dL (ref 70–140)
POTASSIUM: 3.1 meq/L — AB (ref 3.5–5.1)
SODIUM: 142 meq/L (ref 136–145)
Total Bilirubin: 0.56 mg/dL (ref 0.20–1.20)
Total Protein: 8.4 g/dL — ABNORMAL HIGH (ref 6.4–8.3)

## 2015-04-03 LAB — IRON AND TIBC
%SAT: 33 % (ref 21–57)
Iron: 76 ug/dL (ref 41–142)
TIBC: 229 ug/dL — AB (ref 236–444)
UIBC: 152 ug/dL (ref 120–384)

## 2015-04-03 LAB — CHCC SATELLITE - SMEAR

## 2015-04-03 LAB — FERRITIN: Ferritin: 218 ng/ml (ref 9–269)

## 2015-04-03 LAB — LACTATE DEHYDROGENASE: LDH: 271 U/L — AB (ref 125–245)

## 2015-04-03 NOTE — Progress Notes (Signed)
Hematology and Oncology Follow Up Visit  Alexandra [name] [id number] [birth year] 65 y.o. 04/03/2015   Principle Diagnosis:   Anagrelide 4 mg by mouth daily  Aspirin 81 mg by mouth daily  Current Therapy:    Essential thrombocythemia-Calreticulin positive       Interim History:  [name] is back for followup. She had nice Christmas holiday. She did go down to [place].  She still working. 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   She's had no fatigue or weakness. She's had no change in bowel or bladder habits. She's had no cough. She had no headache. There's been no burning in the hands or feet.   She's had no rashes. There's not been any leg swelling.    Overall, her performance status is ECOG 0.  Medications:  Current outpatient prescriptions:  .  amLODipine (NORVASC) 5 MG tablet, Take 1 tablet (5 mg total) by mouth daily. Alexandra [name] AN APPOINTMENT^^^, Disp: 30 tablet, Rfl: 0 .  anagrelide (AGRYLIN) 1 MG capsule, TAKE 4 CAPSULES BY MOUTH EVERY DAY, Disp: 120 capsule, Rfl: 0 .  aspirin 81 MG tablet, Take 81 mg by mouth daily., Disp: , Rfl:  .  Blood Glucose Monitoring Suppl (ONE TOUCH ULTRA SYSTEM KIT) W/DEVICE KIT, 1 kit daily. Pt is Pre-Diabetic, Disp: , Rfl:  .  BYSTOLIC 10 MG tablet, TAKE TWO TABLETS BY MOUTH ONCE DAILY IN THE MORNING, Disp: 60 tablet, Rfl: 5 .  glucose blood (ONE TOUCH ULTRA TEST) test strip, Use to test blood sugar 1 times daily. **PT NEEDS FOLLOW UP APPT FOR FURTHER REFILLS**, Disp: 100 each, Rfl: 0 .  lisinopril (PRINIVIL,ZESTRIL) 20 MG tablet, TAKE TWO TABLETS BY MOUTH ONCE DAILY, Disp: 60 tablet, Rfl: 5 .  ONETOUCH DELICA LANCETS 24Q MISC, Use to test blood sugar 1 time daily. **PT NEEDS FOLLOW UP APPT FOR FURTHER REFILLS**, Disp: 100 each, Rfl: 1 .  Vitamin D, Ergocalciferol, (DRISDOL) 50000 units CAPS capsule, TAKE ONE CAPSULE BY MOUTH ONCE A WEEK, Disp: 12 capsule, Rfl: 0  Allergies: No Known  Allergies  Past Medical History, Surgical history, Social history, and Family History were reviewed and updated.  Review of Systems: As above  Physical Exam:  height is '5\' 9"'  (1.753 m) and weight is 153 lb (69.4 kg). Her oral temperature is 97.3 F (36.3 C). Her blood pressure is 166/78 and her pulse is 86. Her respiration is 16.   Well-developed and well-nourished African American female. Head and neck exam shows no ocular or oral lesions. There are no palpable cervical or supraclavicular lymph nodes. Lungs are clear. Cardiac exam regular rate and rhythm with no murmurs rubs or bruits. Abdomen is soft. She is good bowel sounds. There is no fluid wave. There is no palpable liver or spleen tip. Back exam shows no tenderness over the spine ribs or hips. Extremities shows no clubbing cyanosis or edema. Skin exam shows no erythema on the palms of her hand. She no ecchymoses or petechia. Neurological exam is nonfocal.  Lab Results  Component Value Date   WBC 6.9 04/03/2015   HGB 10.3* 04/03/2015   HCT 32.5* 04/03/2015   MCV 80* 04/03/2015   PLT 657* 04/03/2015     Chemistry      Component Value Date/Time   NA 142 02/01/2015 1213   NA 139 09/28/2014 1133   NA 143 07/18/2014 1054   K 3.7 02/01/2015 1213   K 3.5 09/28/2014 1133   K 4.4 07/18/2014  1054   CL 102 09/28/2014 1133   CL 106 07/18/2014 1054   CO2 27 02/01/2015 1213   CO2 27 09/28/2014 1133   CO2 28 07/18/2014 1054   BUN 16.2 02/01/2015 1213   BUN 15 09/28/2014 1133   BUN 15 07/18/2014 1054   CREATININE 0.8 02/01/2015 1213   CREATININE 0.8 09/28/2014 1133   CREATININE 0.73 07/18/2014 1054      Component Value Date/Time   CALCIUM 9.4 02/01/2015 1213   CALCIUM 9.2 09/28/2014 1133   CALCIUM 9.5 07/18/2014 1054   ALKPHOS 124 02/01/2015 1213   ALKPHOS 107* 09/28/2014 1133   ALKPHOS 126* 07/18/2014 1054   AST 18 02/01/2015 1213   AST 25 09/28/2014 1133   AST 14 07/18/2014 1054   ALT 18 02/01/2015 1213   ALT 22  09/28/2014 1133   ALT 17 07/18/2014 1054   BILITOT 0.51 02/01/2015 1213   BILITOT 0.90 09/28/2014 1133   BILITOT 0.6 07/18/2014 1054         Impression and Plan: Alexandra [name] is 65 year old [name]. She has essential thrombocythemia. She actually is Calreticulin positive.  Even though her platelet count is up a little bit, I still would not change her anagrelide dose. She is asymptomatic. She is doing well with the anagrelide without any toxicity.  I'll see her back in about 2 months. She has labs done every month.   Alexandra Napoleon, MD 2/1/201711:18 AM

## 2015-04-09 ENCOUNTER — Ambulatory Visit
Admission: RE | Admit: 2015-04-09 | Discharge: 2015-04-09 | Disposition: A | Discharge status: Home / Self Care | Payer: BC Managed Care – PPO | Source: Ambulatory Visit

## 2015-04-09 DIAGNOSIS — Z1231 Encounter for screening mammogram for malignant neoplasm of breast: Secondary | ICD-10-CM

## 2015-04-11 ENCOUNTER — Other Ambulatory Visit: Payer: Self-pay | Admitting: Internal Medicine

## 2015-04-11 DIAGNOSIS — R928 Other abnormal and inconclusive findings on diagnostic imaging of breast: Secondary | ICD-10-CM

## 2015-04-11 NOTE — Telephone Encounter (Signed)
Sent to the pharmacy.  Pt has cpx scheduled for 05/01/15

## 2015-04-15 ENCOUNTER — Telehealth: Payer: Self-pay | Admitting: Internal Medicine

## 2015-04-15 NOTE — Telephone Encounter (Signed)
Order signed.

## 2015-04-15 NOTE — Telephone Encounter (Signed)
Patient had screeining with [facility] on 04/09/15 with abnormal findings.  Patient is scheduled to return tomorrow at 2pm for a bilateral mammogram and ultrasound.  Requesting order for diagnostic bilateral mammogram and ultrasound.  Please fax to [phone], attn:  Latricia or Cherise.

## 2015-04-16 ENCOUNTER — Ambulatory Visit
Admission: RE | Admit: 2015-04-16 | Discharge: 2015-04-16 | Disposition: A | Discharge status: Home / Self Care | Payer: BC Managed Care – PPO | Source: Ambulatory Visit | Attending: Internal Medicine | Admitting: Internal Medicine

## 2015-04-16 DIAGNOSIS — R928 Other abnormal and inconclusive findings on diagnostic imaging of breast: Secondary | ICD-10-CM

## 2015-04-24 ENCOUNTER — Other Ambulatory Visit (INDEPENDENT_AMBULATORY_CARE_PROVIDER_SITE_OTHER): Payer: BC Managed Care – PPO

## 2015-04-24 DIAGNOSIS — Z0000 Encounter for general adult medical examination without abnormal findings: Secondary | ICD-10-CM | POA: Diagnosis not present

## 2015-04-24 LAB — LIPID PANEL
CHOLESTEROL: 210 mg/dL — AB (ref 0–200)
HDL: 53.1 mg/dL (ref >39.00)
LDL Cholesterol: 145 mg/dL — ABNORMAL HIGH (ref 0–99)
NONHDL: 157.03
TRIGLYCERIDES: 60 mg/dL (ref 0.0–149.0)
Total CHOL/HDL Ratio: 4
VLDL: 12 mg/dL (ref 0.0–40.0)

## 2015-04-24 LAB — TSH: TSH: 1.16 u[IU]/mL (ref 0.35–4.50)

## 2015-04-24 LAB — CBC WITH DIFFERENTIAL/PLATELET
BASOS PCT: 0.8 % (ref 0.0–3.0)
Basophils Absolute: 0.1 10*3/uL (ref 0.0–0.1)
EOS ABS: 0.1 10*3/uL (ref 0.0–0.7)
EOS PCT: 2.1 % (ref 0.0–5.0)
HCT: 30 % — ABNORMAL LOW (ref 36.0–46.0)
Hemoglobin: 9.7 g/dL — ABNORMAL LOW (ref 12.0–15.0)
LYMPHS ABS: 1.8 10*3/uL (ref 0.7–4.0)
Lymphocytes Relative: 27.4 % (ref 12.0–46.0)
MCHC: 32.4 g/dL (ref 30.0–36.0)
MCV: 77.3 fl — ABNORMAL LOW (ref 78.0–100.0)
MONO ABS: 0.6 10*3/uL (ref 0.1–1.0)
Monocytes Relative: 9.8 % (ref 3.0–12.0)
NEUTROS PCT: 59.9 % (ref 43.0–77.0)
Neutro Abs: 3.9 10*3/uL (ref 1.4–7.7)
PLATELETS: 675 10*3/uL — AB (ref 150.0–400.0)
RBC: 3.89 Mil/uL (ref 3.87–5.11)
RDW: 19.5 % — AB (ref 11.5–15.5)
WBC: 6.5 10*3/uL (ref 4.0–10.5)

## 2015-04-24 LAB — MICROALBUMIN / CREATININE URINE RATIO
Creatinine,U: 233.2 mg/dL
MICROALB/CREAT RATIO: 0.8 mg/g (ref 0.0–30.0)
Microalb, Ur: 1.8 mg/dL (ref 0.0–1.9)

## 2015-04-24 LAB — HEPATIC FUNCTION PANEL
ALT: 24 U/L (ref 0–35)
AST: 19 U/L (ref 0–37)
Albumin: 4 g/dL (ref 3.5–5.2)
Alkaline Phosphatase: 118 U/L — ABNORMAL HIGH (ref 39–117)
BILIRUBIN TOTAL: 0.6 mg/dL (ref 0.2–1.2)
Bilirubin, Direct: 0.1 mg/dL (ref 0.0–0.3)
Total Protein: 7 g/dL (ref 6.0–8.3)

## 2015-04-24 LAB — BASIC METABOLIC PANEL
BUN: 18 mg/dL (ref 6–23)
CHLORIDE: 105 meq/L (ref 96–112)
CO2: 31 mEq/L (ref 19–32)
CREATININE: 0.79 mg/dL (ref 0.40–1.20)
Calcium: 9.4 mg/dL (ref 8.4–10.5)
GFR: 93.94 mL/min (ref >60.00)
GLUCOSE: 104 mg/dL — AB (ref 70–99)
POTASSIUM: 4.7 meq/L (ref 3.5–5.1)
Sodium: 142 mEq/L (ref 135–145)

## 2015-04-24 LAB — HEMOGLOBIN A1C: HEMOGLOBIN A1C: 6.6 % — AB (ref 4.6–6.5)

## 2015-05-01 ENCOUNTER — Ambulatory Visit (INDEPENDENT_AMBULATORY_CARE_PROVIDER_SITE_OTHER): Payer: Medicare Other | Admitting: Internal Medicine

## 2015-05-01 ENCOUNTER — Encounter: Payer: Self-pay | Admitting: Internal Medicine

## 2015-05-01 VITALS — BP 133/81 | Temp 98.2°F | Ht 69.0 in | Wt 153.0 lb

## 2015-05-01 DIAGNOSIS — D473 Essential (hemorrhagic) thrombocythemia: Secondary | ICD-10-CM

## 2015-05-01 DIAGNOSIS — I10 Essential (primary) hypertension: Secondary | ICD-10-CM | POA: Diagnosis not present

## 2015-05-01 DIAGNOSIS — IMO0001 Reserved for inherently not codable concepts without codable children: Secondary | ICD-10-CM

## 2015-05-01 DIAGNOSIS — R030 Elevated blood-pressure reading, without diagnosis of hypertension: Secondary | ICD-10-CM

## 2015-05-01 DIAGNOSIS — Z0001 Encounter for general adult medical examination with abnormal findings: Secondary | ICD-10-CM | POA: Diagnosis not present

## 2015-05-01 DIAGNOSIS — E119 Type 2 diabetes mellitus without complications: Secondary | ICD-10-CM | POA: Diagnosis not present

## 2015-05-01 DIAGNOSIS — E2839 Other primary ovarian failure: Secondary | ICD-10-CM

## 2015-05-01 DIAGNOSIS — Z0000 Encounter for general adult medical examination without abnormal findings: Secondary | ICD-10-CM

## 2015-05-01 NOTE — Progress Notes (Signed)
Pre visit review using our clinic review tool, if applicable. No additional management support is needed unless otherwise documented below in the visit note.  Chief Complaint  Patient presents with  . Annual Exam    HPI: Patient  Alexandra [name]  65 y.o. comes in today for Preventive Health Care visit  And fu medication ht  bg 133/81     .  bp at home has been mostly controlled goes up in office . Feels very well retired for this year.  hh of 2 sleeps well  Bg controlled  hasnt seen endo in a while  No sx  Health Maintenance  Topic Date Due  . FOOT EXAM  05/07/1960  . ZOSTAVAX  05/08/2010  . OPHTHALMOLOGY EXAM  12/26/2013  . INFLUENZA VACCINE  11/30/2015 (Originally 10/01/2014)  . Hepatitis C Screening  04/30/2016 (Originally 12-19-1950)  . HIV Screening  04/30/2016 (Originally 05/07/1965)  . PAP SMEAR  08/25/2015  . HEMOGLOBIN A1C  10/22/2015  . MAMMOGRAM  04/15/2017  . COLONOSCOPY  05/02/2019  . TETANUS/TDAP  02/26/2021   Health Maintenance Review LIFESTYLE:  Exercise:  y Tobacco/ETS:n Alcohol: per day  Sugar beverages:n Sleep:good  sleep reported .  Drug use: no Last pap 2014 neg HRHPV negative  No sx  ROS:  GEN/ HEENT: No fever, significant weight changes sweats headaches vision problems hearing changes, CV/ PULM; No chest pain shortness of breath cough, syncope,edema  change in exercise tolerance. GI /GU: No adominal pain, vomiting, change in bowel habits. No blood in the stool. No significant GU symptoms. SKIN/HEME: ,no acute skin rashes suspicious lesions or bleeding. No lymphadenopathy, nodules, masses.  NEURO/ PSYCH:  No neurologic signs such as weakness numbness. No depression anxiety. IMM/ Allergy: No unusual infections.  Allergy .   REST of 12 system review negative except as per HPI   Past Medical History  Diagnosis Date  . Hypertension     echo   . Thrombocytosis ([place])   . Diabetes mellitus without complication ([place])   . Iron deficiency anemia,  unspecified 03/15/2013    Past Surgical History  Procedure Laterality Date  . Ectopic pregnancy surgery  1984  . Bone marrow biopsy      Family History  Problem Relation Age of Onset  . Heart disease Mother     died age 11  . Heart attack Father 64    massive  . Hypertension Sister   . Hypertension Sister   . Hypertension Sister   . Hypertension Sister   . Arthritis      family hx  . Diabetes      family hx  . Kidney disease Mother     family hx  . Stroke       dialysis    Social History   Social History  . Marital Status: Married    Spouse Name: N/A  . Number of Children: N/A  . Years of Education: N/A   Social History Main Topics  . Smoking status: Never Smoker   . Smokeless tobacco: Never Used     Comment: never used tobacco  . Alcohol Use: No  . Drug Use: No  . Sexual Activity: Not Asked   Other Topics Concern  . 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   No tobacco no ethoh little caffiene .     Outpatient  Prescriptions Prior to Visit  Medication Sig Dispense Refill  . amLODipine (NORVASC) 5 MG tablet Take 1 tablet (5 mg total) by mouth daily. 30 tablet 0  . anagrelide (AGRYLIN) 1 MG capsule TAKE 4 CAPSULES BY MOUTH EVERY DAY 120 capsule 0  . aspirin 81 MG tablet Take 81 mg by mouth daily.    . Blood Glucose Monitoring Suppl (ONE TOUCH ULTRA SYSTEM KIT) W/DEVICE KIT 1 kit daily. Pt is Pre-Diabetic    . BYSTOLIC 10 MG tablet TAKE TWO TABLETS BY MOUTH ONCE DAILY IN THE MORNING 60 tablet 5  . glucose blood (ONE TOUCH ULTRA TEST) test strip Use to test blood sugar 1 times daily. **PT NEEDS FOLLOW UP APPT FOR FURTHER REFILLS** 100 each 0  . lisinopril (PRINIVIL,ZESTRIL) 20 MG tablet TAKE TWO TABLETS BY MOUTH ONCE DAILY 60 tablet 5  . ONETOUCH DELICA LANCETS 62X MISC Use to test blood sugar 1 time daily. **PT NEEDS FOLLOW UP APPT FOR FURTHER REFILLS** 100 each 1  . Vitamin D,  Ergocalciferol, (DRISDOL) 50000 units CAPS capsule TAKE ONE CAPSULE BY MOUTH ONCE A WEEK 12 capsule 0   No facility-administered medications prior to visit.     EXAM:  BP 133/81 mmHg  Temp(Src) 98.2 F (36.8 C) (Oral)  Ht '5\' 9"'  (1.753 m)  Wt 153 lb (69.4 kg)  BMI 22.58 kg/m2  Body mass index is 22.58 kg/(m^2).  Physical Exam: Vital signs reviewed BMW:UXLK is a well-developed well-nourished alert cooperative    who appearsr stated age in no acute distress.  HEENT: normocephalic atraumatic , Eyes: PERRL EOM's full, conjunctiva clear, Nares: paten,t no deformity discharge or tenderness., Ears: no deformity EAC's clear TMs with normal landmarks. Mouth: clear OP, no lesions, edema.  Moist mucous membranes. Dentition in adequate repair. NECK: supple without masses, thyromegaly or bruits. CHEST/PULM:  Clear to auscultation and percussion breath sounds equal no wheeze , rales or rhonchi. No chest wall deformities or tenderness.Breast: normal by inspection . No dimpling, discharge, masses, tenderness or discharge . CV: PMI is nondisplaced, S1 S2 no gallops, murmurs, rubs. Peripheral pulses are full without delay.No JVD .  ABDOMEN: Bowel sounds normal nontender  No guard or rebound, no hepato splenomegal no CVA tenderness.  No hernia. Extremtities:  No clubbing cyanosis or edema, no acute joint swelling or redness no focal atrophy NEURO:  Oriented x3, cranial nerves 3-12 appear to be intact, no obvious focal weakness,gait within normal limits no abnormal reflexes or asymmetrical SKIN: No acute rashes normal turgor, color, no bruising or petechiae. PSYCH: Oriented, good eye contact, no obvious depression anxiety, cognition and judgment appear normal. LN: no cervical axillary inguinal adenopathy Diabetic Foot Exam - Simple   Simple Foot Form  Diabetic Foot exam was performed with the following findings:  Yes 05/01/2015  1:25 PM  Visual Inspection  No deformities, no ulcerations, no other skin  breakdown bilaterally:  Yes  Sensation Testing  Intact to touch and monofilament testing bilaterally:  Yes  Pulse Check  Posterior Tibialis and Dorsalis pulse intact bilaterally:  Yes  Comments  Mild bunion no callous       Lab Results  Component Value Date   WBC 6.5 04/24/2015   HGB 9.7* 04/24/2015   HCT 30.0* 04/24/2015   PLT 675.0* 04/24/2015   GLUCOSE 104* 04/24/2015   CHOL 210* 04/24/2015   TRIG 60.0 04/24/2015   HDL 53.10 04/24/2015   LDLDIRECT 127.3 04/19/2012   LDLCALC 145* 04/24/2015   ALT 24 04/24/2015   AST 19  04/24/2015   NA 142 04/24/2015   K 4.7 04/24/2015   CL 105 04/24/2015   CREATININE 0.79 04/24/2015   BUN 18 04/24/2015   CO2 31 04/24/2015   TSH 1.16 04/24/2015   HGBA1C 6.6* 04/24/2015   MICROALBUR 1.8 04/24/2015   BP Readings from Last 3 Encounters:  05/01/15 133/81  04/03/15 166/78  02/01/15 157/90    ASSESSMENT AND PLAN:  Discussed the following assessment and plan:  Visit for preventive health examination  Essential hypertension  Controlled type 2 diabetes mellitus without complication, without long-term current use of insulin (HCC)  Essential thrombocythemia ([place])  Estrogen deficiency - dexa at or after 65 pat will make appt.  - Plan: DG Bone Density  White coat hypertension  Hg a1c in 6 months ov or not. Get dexa when convenient  Fu of bg or bp not controlled  Patient Care Team: Burnis Medin, MD as PCP - General [name], MD as Attending Physician (Hematology and Oncology) Philemon Kingdom, MD as Consulting Physician (Internal Medicine) Patient Instructions  Continue to monitor /blood pressure readings at home   Send [place] readings   To confirm control  Goal is below 140/90  Continue lifestyle intervention healthy eating and exercise . Get a bone density scan  At age 80 Can come  For appt  prevnar 57  At or after age 30.   PAP due 2018 - 2019  And then  Not indicated.  Continue monitoring the blood sugars .  Advise   Hg a1c in 6 months or as needed   ( ov if needed.) Will send copy of   Current labs to dr Martha Clan.   Health Maintenance, Female Adopting a healthy lifestyle and getting preventive care can go a long way to promote health and wellness. Talk with your health care provider about what schedule of regular examinations is right for you. This is a good chance for you to check in with your provider about disease prevention and staying healthy. In between checkups, there are plenty of things you can do on your own. Experts have done a lot of research about which lifestyle changes and preventive measures are most likely to keep you healthy. Ask your health care provider for more information. WEIGHT AND DIET  Eat a healthy diet  Be sure to include plenty of vegetables, fruits, low-fat dairy products, and lean protein.  Do not eat a lot of foods high in solid fats, added sugars, or salt.  Get regular exercise. This is one of the most important things you can do for your health.  Most adults should exercise for at least 150 minutes each week. The exercise should increase your heart rate and make you sweat (moderate-intensity exercise).  Most adults should also do strengthening exercises at least twice a week. This is in addition to the moderate-intensity exercise.  Maintain a healthy weight  Body mass index (BMI) is a measurement that can be used to identify possible weight problems. It estimates body fat based on height and weight. Your health care provider can help determine your BMI and help you achieve or maintain a healthy weight.  For females 67 years of age and older:   A BMI below 18.5 is considered underweight.  A BMI of 18.5 to 24.9 is normal.  A BMI of 25 to 29.9 is considered overweight.  A BMI of 30 and above is considered obese.  Watch levels of cholesterol and blood lipids  You should start having your blood  tested for lipids and cholesterol at 65 years of age, then have this  test every 5 years.  You may need to have your cholesterol levels checked more often if:  Your lipid or cholesterol levels are high.  You are older than 65 years of age.  You are at high risk for heart disease.  CANCER SCREENING   Lung Cancer  Lung cancer screening is recommended for adults 51-36 years old who are at high risk for lung cancer because of a history of smoking.  A yearly low-dose CT scan of the lungs is recommended for people who:  Currently smoke.  Have quit within the past 15 years.  Have at least a 30-pack-year history of smoking. A pack year is smoking an average of one pack of cigarettes a day for 1 year.  Yearly screening should continue until it has been 15 years since you quit.  Yearly screening should stop if you develop a health problem that would prevent you from having lung cancer treatment.  Breast Cancer  Practice breast self-awareness. This means understanding how your breasts normally appear and feel.  It also means doing regular breast self-exams. Let your health care provider know about any changes, no matter how small.  If you are in your 20s or 30s, you should have a clinical breast exam (CBE) by a health care provider every 1-3 years as part of a regular health exam.  If you are 69 or older, have a CBE every year. Also consider having a breast X-ray (mammogram) every year.  If you have a family history of breast cancer, talk to your health care provider about genetic screening.  If you are at high risk for breast cancer, talk to your health care provider about having an MRI and a mammogram every year.  Breast cancer gene (BRCA) assessment is recommended for women who have family members with BRCA-related cancers. BRCA-related cancers include:  Breast.  Ovarian.  Tubal.  Peritoneal cancers.  Results of the assessment will determine the need for genetic counseling and BRCA1 and BRCA2 testing. Cervical Cancer Your health care  provider may recommend that you be screened regularly for cancer of the pelvic organs (ovaries, uterus, and vagina). This screening involves a pelvic examination, including checking for microscopic changes to the surface of your cervix (Pap test). You may be encouraged to have this screening done every 3 years, beginning at age 44.  For women ages 84-65, health care providers may recommend pelvic exams and Pap testing every 3 years, or they may recommend the Pap and pelvic exam, combined with testing for human papilloma virus (HPV), every 5 years. Some types of HPV increase your risk of cervical cancer. Testing for HPV may also be done on women of any age with unclear Pap test results.  Other health care providers may not recommend any screening for nonpregnant women who are considered low risk for pelvic cancer and who do not have symptoms. Ask your health care provider if a screening pelvic exam is right for you.  If you have had past treatment for cervical cancer or a condition that could lead to cancer, you need Pap tests and screening for cancer for at least 20 years after your treatment. If Pap tests have been discontinued, your risk factors (such as having a new sexual partner) need to be reassessed to determine if screening should resume. Some women have medical problems that increase the chance of getting cervical cancer. In these cases, your health care provider  may recommend more frequent screening and Pap tests. Colorectal Cancer  This type of cancer can be detected and often prevented.  Routine colorectal cancer screening usually begins at 65 years of age and continues through 65 years of age.  Your health care provider may recommend screening at an earlier age if you have risk factors for colon cancer.  Your health care provider may also recommend using home test kits to check for hidden blood in the stool.  A small camera at the end of a tube can be used to examine your colon directly  (sigmoidoscopy or colonoscopy). This is done to check for the earliest forms of colorectal cancer.  Routine screening usually begins at age 30.  Direct examination of the colon should be repeated every 5-10 years through 65 years of age. However, you may need to be screened more often if early forms of precancerous polyps or small growths are found. Skin Cancer  Check your skin from head to toe regularly.  Tell your health care provider about any new moles or changes in moles, especially if there is a change in a mole's shape or color.  Also tell your health care provider if you have a mole that is larger than the size of a pencil eraser.  Always use sunscreen. Apply sunscreen liberally and repeatedly throughout the day.  Protect yourself by wearing long sleeves, pants, a wide-brimmed hat, and sunglasses whenever you are outside. HEART DISEASE, DIABETES, AND HIGH BLOOD PRESSURE   High blood pressure causes heart disease and increases the risk of stroke. High blood pressure is more likely to develop in:  People who have blood pressure in the high end of the normal range (130-139/85-89 mm Hg).  People who are overweight or obese.  People who are African American.  If you are 3-11 years of age, have your blood pressure checked every 3-5 years. If you are 41 years of age or older, have your blood pressure checked every year. You should have your blood pressure measured twice--once when you are at a hospital or clinic, and once when you are not at a hospital or clinic. Record the average of the two measurements. To check your blood pressure when you are not at a hospital or clinic, you can use:  An automated blood pressure machine at a pharmacy.  A home blood pressure monitor.  If you are between 63 years and 48 years old, ask your health care provider if you should take aspirin to prevent strokes.  Have regular diabetes screenings. This involves taking a blood sample to check your  fasting blood sugar level.  If you are at a normal weight and have a low risk for diabetes, have this test once every three years after 65 years of age.  If you are overweight and have a high risk for diabetes, consider being tested at a younger age or more often. PREVENTING INFECTION  Hepatitis B  If you have a higher risk for hepatitis B, you should be screened for this virus. You are considered at high risk for hepatitis B if:  You were born in a country where hepatitis B is common. Ask your health care provider which countries are considered high risk.  Your parents were born in a high-risk country, and you have not been immunized against hepatitis B (hepatitis B vaccine).  You have HIV or AIDS.  You use needles to inject street drugs.  You live with someone who has hepatitis B.  You have had  sex with someone who has hepatitis B.  You get hemodialysis treatment.  You take certain medicines for conditions, including cancer, organ transplantation, and autoimmune conditions. Hepatitis C  Blood testing is recommended for:  Everyone born from [birth year] through 1965.  Anyone with known risk factors for hepatitis C. Sexually transmitted infections (STIs)  You should be screened for sexually transmitted infections (STIs) including gonorrhea and chlamydia if:  You are sexually active and are younger than 65 years of age.  You are older than 65 years of age and your health care provider tells you that you are at risk for this type of infection.  Your sexual activity has changed since you were last screened and you are at an increased risk for chlamydia or gonorrhea. Ask your health care provider if you are at risk.  If you do not have HIV, but are at risk, it may be recommended that you take a prescription medicine daily to prevent HIV infection. This is called pre-exposure prophylaxis (PrEP). You are considered at risk if:  You are sexually active and do not regularly use condoms or  know the HIV status of your partner(s).  You take drugs by injection.  You are sexually active with a partner who has HIV. Talk with your health care provider about whether you are at high risk of being infected with HIV. If you choose to begin PrEP, you should first be tested for HIV. You should then be tested every 3 months for as long as you are taking PrEP.  PREGNANCY   If you are premenopausal and you may become pregnant, ask your health care provider about preconception counseling.  If you may become pregnant, take 400 to 800 micrograms (mcg) of folic acid every day.  If you want to prevent pregnancy, talk to your health care provider about birth control (contraception). OSTEOPOROSIS AND MENOPAUSE   Osteoporosis is a disease in which the bones lose minerals and strength with aging. This can result in serious bone fractures. Your risk for osteoporosis can be identified using a bone density scan.  If you are 35 years of age or older, or if you are at risk for osteoporosis and fractures, ask your health care provider if you should be screened.  Ask your health care provider whether you should take a calcium or vitamin D supplement to lower your risk for osteoporosis.  Menopause may have certain physical symptoms and risks.  Hormone replacement therapy may reduce some of these symptoms and risks. Talk to your health care provider about whether hormone replacement therapy is right for you.  HOME CARE INSTRUCTIONS   Schedule regular health, dental, and eye exams.  Stay current with your immunizations.   Do not use any tobacco products including cigarettes, chewing tobacco, or electronic cigarettes.  If you are pregnant, do not drink alcohol.  If you are breastfeeding, limit how much and how often you drink alcohol.  Limit alcohol intake to no more than 1 drink per day for nonpregnant women. One drink equals 12 ounces of beer, 5 ounces of wine, or 1 ounces of hard liquor.  Do  not use street drugs.  Do not share needles.  Ask your health care provider for help if you need support or information about quitting drugs.  Tell your health care provider if you often feel depressed.  Tell your health care provider if you have ever been abused or do not feel safe at home.   This information is not intended to replace  advice given to you by your health care provider. Make sure you discuss any questions you have with your health care provider.   Document Released: 09/01/2010 Document Revised: 03/09/2014 Document Reviewed: 01/18/2013 Elsevier Interactive Patient Education 2016 [place] K. Panosh M.D.

## 2015-05-01 NOTE — Patient Instructions (Addendum)
Continue to monitor /blood pressure readings at home   Send [place] readings   To confirm control  Goal is below 140/90  Continue lifestyle intervention healthy eating and exercise . Get a bone density scan  At age 65 Can come  For appt  prevnar 33  At or after age 54.   PAP due 2018 - 2019  And then  Not indicated.  Continue monitoring the blood sugars .  Advise  Hg a1c in 6 months or as needed   ( ov if needed.) Will send copy of   Current labs to dr Martha Clan.   Health Maintenance, Female Adopting a healthy lifestyle and getting preventive care can go a long way to promote health and wellness. Talk with your health care provider about what schedule of regular examinations is right for you. This is a good chance for you to check in with your provider about disease prevention and staying healthy. In between checkups, there are plenty of things you can do on your own. Experts have done a lot of research about which lifestyle changes and preventive measures are most likely to keep you healthy. Ask your health care provider for more information. WEIGHT AND DIET  Eat a healthy diet  Be sure to include plenty of vegetables, fruits, low-fat dairy products, and lean protein.  Do not eat a lot of foods high in solid fats, added sugars, or salt.  Get regular exercise. This is one of the most important things you can do for your health.  Most adults should exercise for at least 150 minutes each week. The exercise should increase your heart rate and make you sweat (moderate-intensity exercise).  Most adults should also do strengthening exercises at least twice a week. This is in addition to the moderate-intensity exercise.  Maintain a healthy weight  Body mass index (BMI) is a measurement that can be used to identify possible weight problems. It estimates body fat based on height and weight. Your health care provider can help determine your BMI and help you achieve or maintain a healthy weight.  For  females 23 years of age and older:   A BMI below 18.5 is considered underweight.  A BMI of 18.5 to 24.9 is normal.  A BMI of 25 to 29.9 is considered overweight.  A BMI of 30 and above is considered obese.  Watch levels of cholesterol and blood lipids  You should start having your blood tested for lipids and cholesterol at 65 years of age, then have this test every 5 years.  You may need to have your cholesterol levels checked more often if:  Your lipid or cholesterol levels are high.  You are older than 65 years of age.  You are at high risk for heart disease.  CANCER SCREENING   Lung Cancer  Lung cancer screening is recommended for adults 29-51 years old who are at high risk for lung cancer because of a history of smoking.  A yearly low-dose CT scan of the lungs is recommended for people who:  Currently smoke.  Have quit within the past 15 years.  Have at least a 30-pack-year history of smoking. A pack year is smoking an average of one pack of cigarettes a day for 1 year.  Yearly screening should continue until it has been 15 years since you quit.  Yearly screening should stop if you develop a health problem that would prevent you from having lung cancer treatment.  Breast Cancer  Practice breast self-awareness. This  means understanding how your breasts normally appear and feel.  It also means doing regular breast self-exams. Let your health care provider know about any changes, no matter how small.  If you are in your 20s or 30s, you should have a clinical breast exam (CBE) by a health care provider every 1-3 years as part of a regular health exam.  If you are 53 or older, have a CBE every year. Also consider having a breast X-ray (mammogram) every year.  If you have a family history of breast cancer, talk to your health care provider about genetic screening.  If you are at high risk for breast cancer, talk to your health care provider about having an MRI and  a mammogram every year.  Breast cancer gene (BRCA) assessment is recommended for women who have family members with BRCA-related cancers. BRCA-related cancers include:  Breast.  Ovarian.  Tubal.  Peritoneal cancers.  Results of the assessment will determine the need for genetic counseling and BRCA1 and BRCA2 testing. Cervical Cancer Your health care provider may recommend that you be screened regularly for cancer of the pelvic organs (ovaries, uterus, and vagina). This screening involves a pelvic examination, including checking for microscopic changes to the surface of your cervix (Pap test). You may be encouraged to have this screening done every 3 years, beginning at age 77.  For women ages 54-65, health care providers may recommend pelvic exams and Pap testing every 3 years, or they may recommend the Pap and pelvic exam, combined with testing for human papilloma virus (HPV), every 5 years. Some types of HPV increase your risk of cervical cancer. Testing for HPV may also be done on women of any age with unclear Pap test results.  Other health care providers may not recommend any screening for nonpregnant women who are considered low risk for pelvic cancer and who do not have symptoms. Ask your health care provider if a screening pelvic exam is right for you.  If you have had past treatment for cervical cancer or a condition that could lead to cancer, you need Pap tests and screening for cancer for at least 20 years after your treatment. If Pap tests have been discontinued, your risk factors (such as having a new sexual partner) need to be reassessed to determine if screening should resume. Some women have medical problems that increase the chance of getting cervical cancer. In these cases, your health care provider may recommend more frequent screening and Pap tests. Colorectal Cancer  This type of cancer can be detected and often prevented.  Routine colorectal cancer screening usually  begins at 65 years of age and continues through 65 years of age.  Your health care provider may recommend screening at an earlier age if you have risk factors for colon cancer.  Your health care provider may also recommend using home test kits to check for hidden blood in the stool.  A small camera at the end of a tube can be used to examine your colon directly (sigmoidoscopy or colonoscopy). This is done to check for the earliest forms of colorectal cancer.  Routine screening usually begins at age 75.  Direct examination of the colon should be repeated every 5-10 years through 65 years of age. However, you may need to be screened more often if early forms of precancerous polyps or small growths are found. Skin Cancer  Check your skin from head to toe regularly.  Tell your health care provider about any new moles or changes  in moles, especially if there is a change in a mole's shape or color.  Also tell your health care provider if you have a mole that is larger than the size of a pencil eraser.  Always use sunscreen. Apply sunscreen liberally and repeatedly throughout the day.  Protect yourself by wearing long sleeves, pants, a wide-brimmed hat, and sunglasses whenever you are outside. HEART DISEASE, DIABETES, AND HIGH BLOOD PRESSURE   High blood pressure causes heart disease and increases the risk of stroke. High blood pressure is more likely to develop in:  People who have blood pressure in the high end of the normal range (130-139/85-89 mm Hg).  People who are overweight or obese.  People who are African American.  If you are 75-94 years of age, have your blood pressure checked every 3-5 years. If you are 81 years of age or older, have your blood pressure checked every year. You should have your blood pressure measured twice--once when you are at a hospital or clinic, and once when you are not at a hospital or clinic. Record the average of the two measurements. To check your blood  pressure when you are not at a hospital or clinic, you can use:  An automated blood pressure machine at a pharmacy.  A home blood pressure monitor.  If you are between 20 years and 56 years old, ask your health care provider if you should take aspirin to prevent strokes.  Have regular diabetes screenings. This involves taking a blood sample to check your fasting blood sugar level.  If you are at a normal weight and have a low risk for diabetes, have this test once every three years after 65 years of age.  If you are overweight and have a high risk for diabetes, consider being tested at a younger age or more often. PREVENTING INFECTION  Hepatitis B  If you have a higher risk for hepatitis B, you should be screened for this virus. You are considered at high risk for hepatitis B if:  You were born in a country where hepatitis B is common. Ask your health care provider which countries are considered high risk.  Your parents were born in a high-risk country, and you have not been immunized against hepatitis B (hepatitis B vaccine).  You have HIV or AIDS.  You use needles to inject street drugs.  You live with someone who has hepatitis B.  You have had sex with someone who has hepatitis B.  You get hemodialysis treatment.  You take certain medicines for conditions, including cancer, organ transplantation, and autoimmune conditions. Hepatitis C  Blood testing is recommended for:  Everyone born from [birth year] through 1965.  Anyone with known risk factors for hepatitis C. Sexually transmitted infections (STIs)  You should be screened for sexually transmitted infections (STIs) including gonorrhea and chlamydia if:  You are sexually active and are younger than 65 years of age.  You are older than 65 years of age and your health care provider tells you that you are at risk for this type of infection.  Your sexual activity has changed since you were last screened and you are at an  increased risk for chlamydia or gonorrhea. Ask your health care provider if you are at risk.  If you do not have HIV, but are at risk, it may be recommended that you take a prescription medicine daily to prevent HIV infection. This is called pre-exposure prophylaxis (PrEP). You are considered at risk if:  You are  sexually active and do not regularly use condoms or know the HIV status of your partner(s).  You take drugs by injection.  You are sexually active with a partner who has HIV. Talk with your health care provider about whether you are at high risk of being infected with HIV. If you choose to begin PrEP, you should first be tested for HIV. You should then be tested every 3 months for as long as you are taking PrEP.  PREGNANCY   If you are premenopausal and you may become pregnant, ask your health care provider about preconception counseling.  If you may become pregnant, take 400 to 800 micrograms (mcg) of folic acid every day.  If you want to prevent pregnancy, talk to your health care provider about birth control (contraception). OSTEOPOROSIS AND MENOPAUSE   Osteoporosis is a disease in which the bones lose minerals and strength with aging. This can result in serious bone fractures. Your risk for osteoporosis can be identified using a bone density scan.  If you are 72 years of age or older, or if you are at risk for osteoporosis and fractures, ask your health care provider if you should be screened.  Ask your health care provider whether you should take a calcium or vitamin D supplement to lower your risk for osteoporosis.  Menopause may have certain physical symptoms and risks.  Hormone replacement therapy may reduce some of these symptoms and risks. Talk to your health care provider about whether hormone replacement therapy is right for you.  HOME CARE INSTRUCTIONS   Schedule regular health, dental, and eye exams.  Stay current with your immunizations.   Do not use any  tobacco products including cigarettes, chewing tobacco, or electronic cigarettes.  If you are pregnant, do not drink alcohol.  If you are breastfeeding, limit how much and how often you drink alcohol.  Limit alcohol intake to no more than 1 drink per day for nonpregnant women. One drink equals 12 ounces of beer, 5 ounces of wine, or 1 ounces of hard liquor.  Do not use street drugs.  Do not share needles.  Ask your health care provider for help if you need support or information about quitting drugs.  Tell your health care provider if you often feel depressed.  Tell your health care provider if you have ever been abused or do not feel safe at home.   This information is not intended to replace advice given to you by your health care provider. Make sure you discuss any questions you have with your health care provider.   Document Released: 09/01/2010 Document Revised: 03/09/2014 Document Reviewed: 01/18/2013 Elsevier Interactive Patient Education [employer].

## 2015-05-02 ENCOUNTER — Other Ambulatory Visit: Payer: Self-pay | Admitting: Hematology & Oncology

## 2015-05-09 ENCOUNTER — Other Ambulatory Visit: Payer: BC Managed Care – PPO

## 2015-05-10 ENCOUNTER — Other Ambulatory Visit (HOSPITAL_BASED_OUTPATIENT_CLINIC_OR_DEPARTMENT_OTHER): Payer: Medicare Other

## 2015-05-10 DIAGNOSIS — D473 Essential (hemorrhagic) thrombocythemia: Secondary | ICD-10-CM | POA: Diagnosis not present

## 2015-05-10 LAB — CBC WITH DIFFERENTIAL/PLATELET
BASO%: 0.8 % (ref 0.0–2.0)
BASOS ABS: 0.1 10*3/uL (ref 0.0–0.1)
EOS ABS: 0.2 10*3/uL (ref 0.0–0.5)
EOS%: 2.1 % (ref 0.0–7.0)
HCT: 31.1 % — ABNORMAL LOW (ref 34.8–46.6)
HEMOGLOBIN: 9.9 g/dL — AB (ref 11.6–15.9)
LYMPH#: 1.9 10*3/uL (ref 0.9–3.3)
LYMPH%: 25.6 % (ref 14.0–49.7)
MCH: 24.9 pg — AB (ref 25.1–34.0)
MCHC: 31.8 g/dL (ref 31.5–36.0)
MCV: 78.1 fL — ABNORMAL LOW (ref 79.5–101.0)
MONO#: 0.8 10*3/uL (ref 0.1–0.9)
MONO%: 10.3 % (ref 0.0–14.0)
NEUT#: 4.6 10*3/uL (ref 1.5–6.5)
NEUT%: 61.2 % (ref 38.4–76.8)
NRBC: 0 % (ref 0–0)
Platelets: 600 10*3/uL — ABNORMAL HIGH (ref 145–400)
RBC: 3.98 10*6/uL (ref 3.70–5.45)
RDW: 19.2 % — AB (ref 11.2–14.5)
WBC: 7.5 10*3/uL (ref 3.9–10.3)

## 2015-05-16 ENCOUNTER — Other Ambulatory Visit: Payer: Self-pay | Admitting: Internal Medicine

## 2015-06-04 ENCOUNTER — Other Ambulatory Visit: Payer: Self-pay | Admitting: Hematology & Oncology

## 2015-06-12 ENCOUNTER — Ambulatory Visit (HOSPITAL_BASED_OUTPATIENT_CLINIC_OR_DEPARTMENT_OTHER): Payer: Medicare Other | Admitting: Hematology & Oncology

## 2015-06-12 ENCOUNTER — Other Ambulatory Visit (HOSPITAL_BASED_OUTPATIENT_CLINIC_OR_DEPARTMENT_OTHER): Payer: Medicare Other

## 2015-06-12 ENCOUNTER — Encounter: Payer: Self-pay | Admitting: Hematology & Oncology

## 2015-06-12 VITALS — BP 158/87 | HR 83 | Temp 98.1°F | Resp 16 | Ht 69.0 in | Wt 152.0 lb

## 2015-06-12 DIAGNOSIS — D473 Essential (hemorrhagic) thrombocythemia: Secondary | ICD-10-CM

## 2015-06-12 DIAGNOSIS — D509 Iron deficiency anemia, unspecified: Secondary | ICD-10-CM

## 2015-06-12 LAB — CBC WITH DIFFERENTIAL (CANCER CENTER ONLY)
BASO#: 0 10*3/uL (ref 0.0–0.2)
BASO%: 0.5 % (ref 0.0–2.0)
EOS ABS: 0.1 10*3/uL (ref 0.0–0.5)
EOS%: 1.5 % (ref 0.0–7.0)
HCT: 30 % — ABNORMAL LOW (ref 34.8–46.6)
HEMOGLOBIN: 9.6 g/dL — AB (ref 11.6–15.9)
LYMPH#: 1.8 10*3/uL (ref 0.9–3.3)
LYMPH%: 23.7 % (ref 14.0–48.0)
MCH: 25.5 pg — AB (ref 26.0–34.0)
MCHC: 32 g/dL (ref 32.0–36.0)
MCV: 80 fL — ABNORMAL LOW (ref 81–101)
MONO#: 0.9 10*3/uL (ref 0.1–0.9)
MONO%: 11.8 % (ref 0.0–13.0)
NEUT%: 62.5 % (ref 39.6–80.0)
NEUTROS ABS: 4.7 10*3/uL (ref 1.5–6.5)
PLATELETS: 721 10*3/uL — AB (ref 145–400)
RBC: 3.76 10*6/uL (ref 3.70–5.32)
RDW: 19 % — ABNORMAL HIGH (ref 11.1–15.7)
WBC: 7.5 10*3/uL (ref 3.9–10.0)

## 2015-06-12 LAB — COMPREHENSIVE METABOLIC PANEL
ALBUMIN: 3.7 g/dL (ref 3.5–5.0)
ALK PHOS: 105 U/L (ref 40–150)
ALT: 20 U/L (ref 0–55)
AST: 21 U/L (ref 5–34)
Anion Gap: 7 mEq/L (ref 3–11)
BILIRUBIN TOTAL: 0.66 mg/dL (ref 0.20–1.20)
BUN: 16.6 mg/dL (ref 7.0–26.0)
CO2: 30 meq/L — AB (ref 22–29)
Calcium: 9.6 mg/dL (ref 8.4–10.4)
Chloride: 107 mEq/L (ref 98–109)
Creatinine: 0.9 mg/dL (ref 0.6–1.1)
EGFR: 75 mL/min/{1.73_m2} — ABNORMAL LOW (ref >90)
GLUCOSE: 117 mg/dL (ref 70–140)
Potassium: 4.2 mEq/L (ref 3.5–5.1)
SODIUM: 144 meq/L (ref 136–145)
TOTAL PROTEIN: 7.5 g/dL (ref 6.4–8.3)

## 2015-06-12 MED ORDER — ANAGRELIDE HCL 1 MG PO CAPS: 5.0000 mg | ORAL_CAPSULE | Freq: Every day | ORAL | Status: DC

## 2015-06-12 NOTE — Progress Notes (Signed)
Hematology and Oncology Follow Up Visit  NAIRA STANDIFORD [id number] 1950-09-26 65 y.o. 06/12/2015   Principle Diagnosis:   Anagrelide 5 mg by mouth daily - dose changed today  Aspirin 81 mg by mouth daily  Current Therapy:    Essential thrombocythemia-Calreticulin positive       Interim History:  Ms.  Bartoszek is back for followup. Surprisingly enough, she is now tired. She is working at [employer]. She been there for almost 20 years. She is definitely enjoying her retirement.  She's had no problems with pain in the hands or feet. She's had no headache. She's had no visual issues. She's had no change in bowel or bladder habits.  She's had no joint swelling or joint pain.  She's had no headaches. She's had no rashes.  Her appetite is doing quite good. She's trying to watch what she eats so she will not gain weight.   Overall, her performance status is ECOG 0.  Medications:  Current outpatient prescriptions:  .  amLODipine (NORVASC) 5 MG tablet, TAKE ONE TABLET BY MOUTH ONCE DAILY, Disp: 30 tablet, Rfl: 0 .  anagrelide (AGRYLIN) 1 MG capsule, Take 5 capsules (5 mg total) by mouth daily., Disp: 150 capsule, Rfl: 4 .  aspirin 81 MG tablet, Take 81 mg by mouth daily., Disp: , Rfl:  .  Blood Glucose Monitoring Suppl (ONE TOUCH ULTRA SYSTEM KIT) W/DEVICE KIT, 1 kit daily. Pt is Pre-Diabetic, Disp: , Rfl:  .  BYSTOLIC 10 MG tablet, TAKE TWO TABLETS BY MOUTH ONCE DAILY IN THE MORNING, Disp: 60 tablet, Rfl: 5 .  glucose blood (ONE TOUCH ULTRA TEST) test strip, Use to test blood sugar 1 times daily. **PT NEEDS FOLLOW UP APPT FOR FURTHER REFILLS**, Disp: 100 each, Rfl: 0 .  lisinopril (PRINIVIL,ZESTRIL) 20 MG tablet, TAKE TWO TABLETS BY MOUTH ONCE DAILY, Disp: 60 tablet, Rfl: 5 .  ONETOUCH DELICA LANCETS 01U MISC, Use to test blood sugar 1 time daily. **PT NEEDS FOLLOW UP APPT FOR FURTHER REFILLS**, Disp: 100 each, Rfl: 1 .  Vitamin D, Ergocalciferol, (DRISDOL) 50000 units CAPS capsule,  TAKE ONE CAPSULE BY MOUTH ONCE A WEEK, Disp: 12 capsule, Rfl: 0  Allergies: No Known Allergies  Past Medical History, Surgical history, Social history, and Family History were reviewed and updated.  Review of Systems: As above  Physical Exam:  height is '5\' 9"'  (1.753 m) and weight is 152 lb (68.947 kg). Her oral temperature is 98.1 F (36.7 C). Her blood pressure is 158/87 and her pulse is 83. Her respiration is 16.   Well-developed and well-nourished African American female. Head and neck exam shows no ocular or oral lesions. There are no palpable cervical or supraclavicular lymph nodes. Lungs are clear. Cardiac exam regular rate and rhythm with no murmurs rubs or bruits. Abdomen is soft. She is good bowel sounds. There is no fluid wave. There is no palpable liver or spleen tip. Back exam shows no tenderness over the spine ribs or hips. Extremities shows no clubbing cyanosis or edema. Skin exam shows no erythema on the palms of her hand. She no ecchymoses or petechia. Neurological exam is nonfocal.  Lab Results  Component Value Date   WBC 7.5 06/12/2015   HGB 9.6* 06/12/2015   HCT 30.0* 06/12/2015   MCV 80* 06/12/2015   PLT 721* 06/12/2015     Chemistry      Component Value Date/Time   NA 142 04/24/2015 0919   NA 142 04/03/2015 1012   NA  139 09/28/2014 1133   K 4.7 04/24/2015 0919   K 3.1* 04/03/2015 1012   K 3.5 09/28/2014 1133   CL 105 04/24/2015 0919   CL 102 09/28/2014 1133   CO2 31 04/24/2015 0919   CO2 28 04/03/2015 1012   CO2 27 09/28/2014 1133   BUN 18 04/24/2015 0919   BUN 16.3 04/03/2015 1012   BUN 15 09/28/2014 1133   CREATININE 0.79 04/24/2015 0919   CREATININE 0.9 04/03/2015 1012   CREATININE 0.8 09/28/2014 1133      Component Value Date/Time   CALCIUM 9.4 04/24/2015 0919   CALCIUM 9.5 04/03/2015 1012   CALCIUM 9.2 09/28/2014 1133   ALKPHOS 118* 04/24/2015 0919   ALKPHOS 117 04/03/2015 1012   ALKPHOS 107* 09/28/2014 1133   AST 19 04/24/2015 0919   AST  17 04/03/2015 1012   AST 25 09/28/2014 1133   ALT 24 04/24/2015 0919   ALT 15 04/03/2015 1012   ALT 22 09/28/2014 1133   BILITOT 0.6 04/24/2015 0919   BILITOT 0.56 04/03/2015 1012   BILITOT 0.90 09/28/2014 1133         Impression and Plan: Ms. Rhem is 65 year old [name]. She has essential thrombocythemia. She actually is Calreticulin positive.  It really bothers me that her platelet count is still up. We will have to increase her dose of anagrelide. We will put her on 5 mg a day.  She has been on this dose before. It is worked before.  The other option that we have is interferon. She's been on Hydrea which she did not tolerate all that well.  She is asymptomatic.  We will plan to get her back to see [place] in 6 weeks. I don't think we need any blood work in between visits.  I spent about 35 minutes with her today. I want her to understand and take some time with her to explain why we have to make a change in her dosing.      Marland Kitchen [name], MD 4/12/20171:24 PM

## 2015-06-13 ENCOUNTER — Other Ambulatory Visit: Payer: Self-pay | Admitting: Internal Medicine

## 2015-06-13 LAB — IRON AND TIBC
%SAT: 23 % (ref 21–57)
IRON: 51 ug/dL (ref 41–142)
TIBC: 224 ug/dL — AB (ref 236–444)
UIBC: 173 ug/dL (ref 120–384)

## 2015-06-13 LAB — RETICULOCYTES: RETICULOCYTE COUNT: 1.3 % (ref 0.6–2.6)

## 2015-06-13 LAB — FERRITIN: FERRITIN: 175 ng/mL (ref 9–269)

## 2015-06-13 MED ORDER — LISINOPRIL 20 MG PO TABS: 40.0000 mg | ORAL_TABLET | Freq: Every day | ORAL | Status: DC

## 2015-06-14 ENCOUNTER — Other Ambulatory Visit: Payer: Self-pay | Admitting: Internal Medicine

## 2015-06-14 ENCOUNTER — Other Ambulatory Visit: Payer: Self-pay | Admitting: Nurse Practitioner

## 2015-06-14 ENCOUNTER — Telehealth: Payer: Self-pay | Admitting: Nurse Practitioner

## 2015-06-14 DIAGNOSIS — D509 Iron deficiency anemia, unspecified: Secondary | ICD-10-CM

## 2015-06-14 MED ORDER — INTEGRA 62.5-62.5-40-3 MG PO CAPS: 1.0000 | ORAL_CAPSULE | Freq: Every day | ORAL | Status: DC

## 2015-06-14 NOTE — Telephone Encounter (Addendum)
Pt verbalized understanding however at this time she is not interested intaking IV iron. Dr. Marin Olp is aware and patient will begin taking oral iron daily. RX called in. When she returns in 6 weeks we will recheck and she verbalized if no change she will consider taking IV iron. ----- Message from [name], MD sent at 06/13/2015  2:40 PM EDT ----- Call - your iron level is borderline low!!  This my help explain the platelets being higher!!  We need to try 1 dose of Feraheme next week.  Please set this up!!!  pete

## 2015-06-17 NOTE — Telephone Encounter (Signed)
Sent to the pharmacy by e-scribe. 

## 2015-06-24 ENCOUNTER — Other Ambulatory Visit: Payer: Self-pay | Admitting: Hematology & Oncology

## 2015-07-10 ENCOUNTER — Other Ambulatory Visit: Payer: BC Managed Care – PPO

## 2015-07-23 ENCOUNTER — Other Ambulatory Visit: Payer: Self-pay | Admitting: *Deleted

## 2015-07-23 MED ORDER — NEBIVOLOL HCL 20 MG PO TABS: 20.0000 mg | ORAL_TABLET | Freq: Every day | ORAL | Status: DC

## 2015-07-24 ENCOUNTER — Encounter: Payer: Self-pay | Admitting: Hematology & Oncology

## 2015-07-24 ENCOUNTER — Ambulatory Visit (HOSPITAL_BASED_OUTPATIENT_CLINIC_OR_DEPARTMENT_OTHER): Payer: Medicare Other | Admitting: Hematology & Oncology

## 2015-07-24 ENCOUNTER — Other Ambulatory Visit (HOSPITAL_BASED_OUTPATIENT_CLINIC_OR_DEPARTMENT_OTHER): Payer: Medicare Other

## 2015-07-24 VITALS — BP 165/97 | HR 107 | Temp 97.5°F | Resp 18 | Ht 69.0 in | Wt 153.0 lb

## 2015-07-24 DIAGNOSIS — D509 Iron deficiency anemia, unspecified: Secondary | ICD-10-CM

## 2015-07-24 DIAGNOSIS — D473 Essential (hemorrhagic) thrombocythemia: Secondary | ICD-10-CM

## 2015-07-24 LAB — CBC WITH DIFFERENTIAL (CANCER CENTER ONLY)
BASO#: 0 10*3/uL (ref 0.0–0.2)
BASO%: 0.4 % (ref 0.0–2.0)
EOS ABS: 0.2 10*3/uL (ref 0.0–0.5)
EOS%: 2.2 % (ref 0.0–7.0)
HCT: 28.6 % — ABNORMAL LOW (ref 34.8–46.6)
HGB: 9.2 g/dL — ABNORMAL LOW (ref 11.6–15.9)
LYMPH#: 1.6 10*3/uL (ref 0.9–3.3)
LYMPH%: 23.5 % (ref 14.0–48.0)
MCH: 25.8 pg — ABNORMAL LOW (ref 26.0–34.0)
MCHC: 32.2 g/dL (ref 32.0–36.0)
MCV: 80 fL — AB (ref 81–101)
MONO#: 0.8 10*3/uL (ref 0.1–0.9)
MONO%: 11.2 % (ref 0.0–13.0)
NEUT#: 4.3 10*3/uL (ref 1.5–6.5)
NEUT%: 62.7 % (ref 39.6–80.0)
Platelets: 477 10*3/uL — ABNORMAL HIGH (ref 145–400)
RBC: 3.56 10*6/uL — AB (ref 3.70–5.32)
RDW: 19.1 % — ABNORMAL HIGH (ref 11.1–15.7)
WBC: 6.9 10*3/uL (ref 3.9–10.0)

## 2015-07-24 LAB — COMPREHENSIVE METABOLIC PANEL
ALT: 17 U/L (ref 0–55)
AST: 17 U/L (ref 5–34)
Albumin: 3.8 g/dL (ref 3.5–5.0)
Alkaline Phosphatase: 108 U/L (ref 40–150)
Anion Gap: 10 mEq/L (ref 3–11)
BUN: 14.1 mg/dL (ref 7.0–26.0)
CHLORIDE: 106 meq/L (ref 98–109)
CO2: 27 meq/L (ref 22–29)
Calcium: 9.6 mg/dL (ref 8.4–10.4)
Creatinine: 0.8 mg/dL (ref 0.6–1.1)
GLUCOSE: 122 mg/dL (ref 70–140)
POTASSIUM: 3.6 meq/L (ref 3.5–5.1)
SODIUM: 144 meq/L (ref 136–145)
TOTAL PROTEIN: 7.9 g/dL (ref 6.4–8.3)
Total Bilirubin: 0.57 mg/dL (ref 0.20–1.20)

## 2015-07-24 LAB — FERRITIN: Ferritin: 282 ng/ml — ABNORMAL HIGH (ref 9–269)

## 2015-07-24 LAB — IRON AND TIBC
%SAT: 37 % (ref 21–57)
IRON: 82 ug/dL (ref 41–142)
TIBC: 220 ug/dL — ABNORMAL LOW (ref 236–444)
UIBC: 138 ug/dL (ref 120–384)

## 2015-07-24 NOTE — Progress Notes (Signed)
Hematology and Oncology Follow Up Visit  Alexandra [name] [id number] 01-21-1951 65 y.o. 07/24/2015   Principle Diagnosis:   Anagrelide 5 mg by mouth daily - dose changed today  Aspirin 81 mg by mouth daily  Current Therapy:    Essential thrombocythemia-Calreticulin positive       Interim History:  Alexandra [name] is back for followup. She is doing quite well. She getting ready to travel to [facility] for [place] Day weekend.  She is tolerating the increased anagrelide dose well. She's had no problems with leg swelling. She's had no cough or shortness of breath. She's had no diarrhea. She's had no rashes.  She has had no fever.    Overall, her performance status is ECOG 0.  Medications:  Current outpatient prescriptions:  .  amLODipine (NORVASC) 5 MG tablet, TAKE ONE TABLET BY MOUTH ONCE DAILY, Disp: 30 tablet, Rfl: 5 .  anagrelide (AGRYLIN) 1 MG capsule, Take 5 capsules (5 mg total) by mouth daily., Disp: 150 capsule, Rfl: 4 .  aspirin 81 MG tablet, Take 81 mg by mouth daily., Disp: , Rfl:  .  Blood Glucose Monitoring Suppl (ONE TOUCH ULTRA SYSTEM KIT) W/DEVICE KIT, 1 kit daily. Pt is Pre-Diabetic, Disp: , Rfl:  .  Fe Fum-FePoly-Vit C-Vit B3 (INTEGRA) 62.5-62.5-40-3 MG CAPS, Take 1 capsule by mouth daily., Disp: 30 capsule, Rfl: 3 .  glucose blood (ONE TOUCH ULTRA TEST) test strip, Use to test blood sugar 1 times daily. **PT NEEDS FOLLOW UP APPT FOR FURTHER REFILLS**, Disp: 100 each, Rfl: 0 .  lisinopril (PRINIVIL,ZESTRIL) 20 MG tablet, Take 2 tablets (40 mg total) by mouth daily., Disp: 180 tablet, Rfl: 1 .  Nebivolol HCl (BYSTOLIC) 20 MG TABS, Take 1 tablet (20 mg total) by mouth daily., Disp: 30 tablet, Rfl: 5 .  ONETOUCH DELICA LANCETS 67E MISC, Use to test blood sugar 1 time daily. **PT NEEDS FOLLOW UP APPT FOR FURTHER REFILLS**, Disp: 100 each, Rfl: 1 .  Vitamin D, Ergocalciferol, (DRISDOL) 50000 units CAPS capsule, TAKE ONE CAPSULE BY MOUTH ONCE A WEEK, Disp: 12 capsule,  Rfl: 0  Allergies: No Known Allergies  Past Medical History, Surgical history, Social history, and Family History were reviewed and updated.  Review of Systems: As above  Physical Exam:  height is _0  (1.753 m) and weight is 153 lb (69.4 kg). Her temperature is 97.5 F (36.4 C). Her blood pressure is 165/97 and her pulse is 107. Her respiration is 18.   Well-developed and well-nourished African American female. Head and neck exam shows no ocular or oral lesions. There are no palpable cervical or supraclavicular lymph nodes. Lungs are clear. Cardiac exam regular rate and rhythm with no murmurs rubs or bruits. Abdomen is soft. She is good bowel sounds. There is no fluid wave. There is no palpable liver or spleen tip. Back exam shows no tenderness over the spine ribs or hips. Extremities shows no clubbing cyanosis or edema. Skin exam shows no erythema on the palms of her hand. She no ecchymoses or petechia. Neurological exam is nonfocal.  Lab Results  Component Value Date   WBC 6.9 07/24/2015   HGB 9.2* 07/24/2015   HCT 28.6* 07/24/2015   MCV 80* 07/24/2015   PLT 477* 07/24/2015     Chemistry      Component Value Date/Time   NA 144 06/12/2015 1223   NA 142 04/24/2015 0919   NA 139 09/28/2014 1133   K 4.2 06/12/2015 1223   K 4.7 04/24/2015  0919   K 3.5 09/28/2014 1133   CL 105 04/24/2015 0919   CL 102 09/28/2014 1133   CO2 30* 06/12/2015 1223   CO2 31 04/24/2015 0919   CO2 27 09/28/2014 1133   BUN 16.6 06/12/2015 1223   BUN 18 04/24/2015 0919   BUN 15 09/28/2014 1133   CREATININE 0.9 06/12/2015 1223   CREATININE 0.79 04/24/2015 0919   CREATININE 0.8 09/28/2014 1133      Component Value Date/Time   CALCIUM 9.6 06/12/2015 1223   CALCIUM 9.4 04/24/2015 0919   CALCIUM 9.2 09/28/2014 1133   ALKPHOS 105 06/12/2015 1223   ALKPHOS 118* 04/24/2015 0919   ALKPHOS 107* 09/28/2014 1133   AST 21 06/12/2015 1223   AST 19 04/24/2015 0919   AST 25 09/28/2014 1133   ALT 20  06/12/2015 1223   ALT 24 04/24/2015 0919   ALT 22 09/28/2014 1133   BILITOT 0.66 06/12/2015 1223   BILITOT 0.6 04/24/2015 0919   BILITOT 0.90 09/28/2014 1133         Impression and Plan: Alexandra [name] is 65 year old [name]. She has essential thrombocythemia. She actually is Calreticulin positive.  mHer platelet count is responding very nicely to the increase in anagrelide dose. She's had no toxicity from anagrelide. I will not change the dose right now.  I think that we can get her back now in 2 months. If she has any problems between now and 2 was, which always see her back.   As always, she is going to be traveling a lot this summer.    Marland Kitchen [name], MD 5/24/201711:48 AM

## 2015-08-09 ENCOUNTER — Other Ambulatory Visit: Payer: BC Managed Care – PPO

## 2015-09-09 ENCOUNTER — Other Ambulatory Visit: Payer: BC Managed Care – PPO

## 2015-09-25 ENCOUNTER — Other Ambulatory Visit (HOSPITAL_BASED_OUTPATIENT_CLINIC_OR_DEPARTMENT_OTHER): Payer: Medicare Other

## 2015-09-25 ENCOUNTER — Ambulatory Visit (HOSPITAL_BASED_OUTPATIENT_CLINIC_OR_DEPARTMENT_OTHER): Payer: Medicare Other | Admitting: Hematology & Oncology

## 2015-09-25 ENCOUNTER — Encounter: Payer: Self-pay | Admitting: Hematology & Oncology

## 2015-09-25 DIAGNOSIS — E559 Vitamin D deficiency, unspecified: Secondary | ICD-10-CM

## 2015-09-25 DIAGNOSIS — D509 Iron deficiency anemia, unspecified: Secondary | ICD-10-CM

## 2015-09-25 DIAGNOSIS — D473 Essential (hemorrhagic) thrombocythemia: Secondary | ICD-10-CM | POA: Diagnosis not present

## 2015-09-25 LAB — CBC WITH DIFFERENTIAL (CANCER CENTER ONLY)
BASO#: 0 10*3/uL (ref 0.0–0.2)
BASO%: 0.5 % (ref 0.0–2.0)
EOS%: 1.7 % (ref 0.0–7.0)
Eosinophils Absolute: 0.1 10*3/uL (ref 0.0–0.5)
HCT: 29.2 % — ABNORMAL LOW (ref 34.8–46.6)
HGB: 9.3 g/dL — ABNORMAL LOW (ref 11.6–15.9)
LYMPH#: 1.5 10*3/uL (ref 0.9–3.3)
LYMPH%: 23.3 % (ref 14.0–48.0)
MCH: 25.6 pg — ABNORMAL LOW (ref 26.0–34.0)
MCHC: 31.8 g/dL — ABNORMAL LOW (ref 32.0–36.0)
MCV: 80 fL — ABNORMAL LOW (ref 81–101)
MONO#: 0.8 10*3/uL (ref 0.1–0.9)
MONO%: 11.5 % (ref 0.0–13.0)
NEUT#: 4.1 10*3/uL (ref 1.5–6.5)
NEUT%: 63 % (ref 39.6–80.0)
Platelets: 428 10*3/uL — ABNORMAL HIGH (ref 145–400)
RBC: 3.63 10*6/uL — ABNORMAL LOW (ref 3.70–5.32)
RDW: 18.8 % — ABNORMAL HIGH (ref 11.1–15.7)
WBC: 6.5 10*3/uL (ref 3.9–10.0)

## 2015-09-25 LAB — IRON AND TIBC
%SAT: 39 % (ref 21–57)
Iron: 81 ug/dL (ref 41–142)
TIBC: 208 ug/dL — ABNORMAL LOW (ref 236–444)
UIBC: 126 ug/dL (ref 120–384)

## 2015-09-25 LAB — COMPREHENSIVE METABOLIC PANEL
ALT: 12 U/L (ref 0–55)
AST: 15 U/L (ref 5–34)
Albumin: 3.6 g/dL (ref 3.5–5.0)
Alkaline Phosphatase: 118 U/L (ref 40–150)
Anion Gap: 9 mEq/L (ref 3–11)
BILIRUBIN TOTAL: 0.61 mg/dL (ref 0.20–1.20)
BUN: 18.3 mg/dL (ref 7.0–26.0)
CHLORIDE: 107 meq/L (ref 98–109)
CO2: 27 meq/L (ref 22–29)
CREATININE: 0.9 mg/dL (ref 0.6–1.1)
Calcium: 9.2 mg/dL (ref 8.4–10.4)
EGFR: 79 mL/min/{1.73_m2} — AB (ref >90)
GLUCOSE: 117 mg/dL (ref 70–140)
Potassium: 3.5 mEq/L (ref 3.5–5.1)
SODIUM: 142 meq/L (ref 136–145)
TOTAL PROTEIN: 7.5 g/dL (ref 6.4–8.3)

## 2015-09-25 LAB — CHCC SATELLITE - SMEAR

## 2015-09-25 LAB — FERRITIN: Ferritin: 235 ng/ml (ref 9–269)

## 2015-09-25 LAB — LACTATE DEHYDROGENASE: LDH: 228 U/L (ref 125–245)

## 2015-09-25 NOTE — Addendum Note (Signed)
Addended by: Burney Gauze R on: 09/25/2015 11:48 AM   Modules accepted: Orders

## 2015-09-25 NOTE — Progress Notes (Signed)
Hematology and Oncology Follow Up Visit  Alexandra [name] [id number] Jan 05, 1951 65 y.o. 09/25/2015   Principle Diagnosis:   Anagrelide 5 mg by mouth daily - dose changed today  Aspirin 81 mg by mouth daily  Current Therapy:    Essential thrombocythemia-Calreticulin positive       Interim History:  Alexandra [name] is back for followup. She is doing quite well. As always, she just got back from [facility]. As always, it was brutally hot down there. She made a 3 however.  She is now retired. She really is enjoying retirement. She sets her own schedule now. She's able to exercise more. She just feels better.  The increase dose of anagrelide is doing well for her. She's had no leg swelling. She's had no cough. She's had no rashes.  There's been no problems with bleeding or bruising.  She's had no rashes.   Overall, her performance status is ECOG 0.  Medications:  Current Outpatient Prescriptions:  .  amLODipine (NORVASC) 5 MG tablet, TAKE ONE TABLET BY MOUTH ONCE DAILY, Disp: 30 tablet, Rfl: 5 .  anagrelide (AGRYLIN) 1 MG capsule, Take 5 capsules (5 mg total) by mouth daily., Disp: 150 capsule, Rfl: 4 .  aspirin 81 MG tablet, Take 81 mg by mouth daily., Disp: , Rfl:  .  Blood Glucose Monitoring Suppl (ONE TOUCH ULTRA SYSTEM KIT) W/DEVICE KIT, 1 kit daily. Pt is Pre-Diabetic, Disp: , Rfl:  .  Fe Fum-FePoly-Vit C-Vit B3 (INTEGRA) 62.5-62.5-40-3 MG CAPS, Take 1 capsule by mouth daily., Disp: 30 capsule, Rfl: 3 .  glucose blood (ONE TOUCH ULTRA TEST) test strip, Use to test blood sugar 1 times daily. **PT NEEDS FOLLOW UP APPT FOR FURTHER REFILLS**, Disp: 100 each, Rfl: 0 .  lisinopril (PRINIVIL,ZESTRIL) 20 MG tablet, Take 2 tablets (40 mg total) by mouth daily., Disp: 180 tablet, Rfl: 1 .  Nebivolol HCl (BYSTOLIC) 20 MG TABS, Take 1 tablet (20 mg total) by mouth daily., Disp: 30 tablet, Rfl: 5 .  ONETOUCH DELICA LANCETS 32G MISC, Use to test blood sugar 1 time daily. **PT NEEDS  FOLLOW UP APPT FOR FURTHER REFILLS**, Disp: 100 each, Rfl: 1 .  Vitamin D, Ergocalciferol, (DRISDOL) 50000 units CAPS capsule, TAKE ONE CAPSULE BY MOUTH ONCE A WEEK, Disp: 12 capsule, Rfl: 0  Allergies: No Known Allergies  Past Medical History, Surgical history, Social history, and Family History were reviewed and updated.  Review of Systems: As above  Physical Exam:  vitals were not taken for this visit.  Well-developed and well-nourished African American female. Head and neck exam shows no ocular or oral lesions. There are no palpable cervical or supraclavicular lymph nodes. Lungs are clear. Cardiac exam regular rate and rhythm with no murmurs rubs or bruits. Abdomen is soft. She is good bowel sounds. There is no fluid wave. There is no palpable liver or spleen tip. Back exam shows no tenderness over the spine ribs or hips. Extremities shows no clubbing cyanosis or edema. Skin exam shows no erythema on the palms of her hand. She no ecchymoses or petechia. Neurological exam is nonfocal.  Lab Results  Component Value Date   WBC 6.5 09/25/2015   HGB 9.3 (L) 09/25/2015   HCT 29.2 (L) 09/25/2015   MCV 80 (L) 09/25/2015   PLT 428 (H) 09/25/2015     Chemistry      Component Value Date/Time   NA 144 07/24/2015 1045   K 3.6 07/24/2015 1045   CL 105 04/24/2015 0919  CL 102 09/28/2014 1133   CO2 27 07/24/2015 1045   BUN 14.1 07/24/2015 1045   CREATININE 0.8 07/24/2015 1045      Component Value Date/Time   CALCIUM 9.6 07/24/2015 1045   ALKPHOS 108 07/24/2015 1045   AST 17 07/24/2015 1045   ALT 17 07/24/2015 1045   BILITOT 0.57 07/24/2015 1045         Impression and Plan: [name] is 65 year old [name]. She has essential thrombocythemia. She actually is Calreticulin positive.  Her platelet count is responding very nicely to the increase in anagrelide dose. She's had no toxicity from anagrelide. I will not change the dose right now.  I think that we can get  her back now in 2 months. If she has any problems between now and then, we can always see her back.   As always, she is going to be traveling a lot this summer.    Marland Kitchen [name], MD 7/26/201711:39 AM

## 2015-09-26 ENCOUNTER — Telehealth: Payer: Self-pay | Admitting: *Deleted

## 2015-09-26 LAB — VITAMIN D 25 HYDROXY (VIT D DEFICIENCY, FRACTURES): Vitamin D, 25-Hydroxy: 32.4 ng/mL (ref 30.0–100.0)

## 2015-09-26 NOTE — Telephone Encounter (Signed)
-----   Message from [name], MD sent at 09/26/2015 11:34 AM EDT ----- Call - Vit D is doing ok!!  pete

## 2015-10-09 ENCOUNTER — Other Ambulatory Visit: Payer: BC Managed Care – PPO

## 2015-11-08 ENCOUNTER — Other Ambulatory Visit: Payer: BC Managed Care – PPO

## 2015-11-13 ENCOUNTER — Other Ambulatory Visit: Payer: Self-pay | Admitting: Hematology & Oncology

## 2015-11-13 DIAGNOSIS — D509 Iron deficiency anemia, unspecified: Secondary | ICD-10-CM

## 2015-11-18 ENCOUNTER — Other Ambulatory Visit: Payer: Self-pay | Admitting: Nurse Practitioner

## 2015-11-18 MED ORDER — VITAMIN D (ERGOCALCIFEROL) 1.25 MG (50000 UNIT) PO CAPS: 50000.0000 [IU] | ORAL_CAPSULE | ORAL | 3 refills | Status: DC

## 2015-11-27 ENCOUNTER — Ambulatory Visit (HOSPITAL_BASED_OUTPATIENT_CLINIC_OR_DEPARTMENT_OTHER): Payer: Medicare Other | Admitting: Hematology & Oncology

## 2015-11-27 ENCOUNTER — Encounter: Payer: Self-pay | Admitting: Hematology & Oncology

## 2015-11-27 ENCOUNTER — Other Ambulatory Visit (HOSPITAL_BASED_OUTPATIENT_CLINIC_OR_DEPARTMENT_OTHER): Payer: Medicare Other

## 2015-11-27 ENCOUNTER — Other Ambulatory Visit: Payer: Self-pay | Admitting: *Deleted

## 2015-11-27 DIAGNOSIS — D473 Essential (hemorrhagic) thrombocythemia: Secondary | ICD-10-CM

## 2015-11-27 DIAGNOSIS — D509 Iron deficiency anemia, unspecified: Secondary | ICD-10-CM

## 2015-11-27 DIAGNOSIS — E559 Vitamin D deficiency, unspecified: Secondary | ICD-10-CM

## 2015-11-27 LAB — COMPREHENSIVE METABOLIC PANEL
ALT: 17 U/L (ref 0–55)
AST: 18 U/L (ref 5–34)
Albumin: 3.6 g/dL (ref 3.5–5.0)
Alkaline Phosphatase: 117 U/L (ref 40–150)
Anion Gap: 10 mEq/L (ref 3–11)
BUN: 21.3 mg/dL (ref 7.0–26.0)
CALCIUM: 9.3 mg/dL (ref 8.4–10.4)
CHLORIDE: 106 meq/L (ref 98–109)
CO2: 24 meq/L (ref 22–29)
Creatinine: 0.9 mg/dL (ref 0.6–1.1)
EGFR: 79 mL/min/{1.73_m2} — ABNORMAL LOW (ref >90)
Glucose: 147 mg/dl — ABNORMAL HIGH (ref 70–140)
Potassium: 3.5 mEq/L (ref 3.5–5.1)
Sodium: 140 mEq/L (ref 136–145)
TOTAL PROTEIN: 7.6 g/dL (ref 6.4–8.3)
Total Bilirubin: 0.6 mg/dL (ref 0.20–1.20)

## 2015-11-27 LAB — CBC WITH DIFFERENTIAL (CANCER CENTER ONLY)
BASO#: 0 10*3/uL (ref 0.0–0.2)
BASO%: 0.6 % (ref 0.0–2.0)
EOS%: 1.4 % (ref 0.0–7.0)
Eosinophils Absolute: 0.1 10*3/uL (ref 0.0–0.5)
HEMATOCRIT: 28.4 % — AB (ref 34.8–46.6)
HGB: 9.2 g/dL — ABNORMAL LOW (ref 11.6–15.9)
LYMPH#: 1.5 10*3/uL (ref 0.9–3.3)
LYMPH%: 21.9 % (ref 14.0–48.0)
MCH: 25.8 pg — ABNORMAL LOW (ref 26.0–34.0)
MCHC: 32.4 g/dL (ref 32.0–36.0)
MCV: 80 fL — ABNORMAL LOW (ref 81–101)
MONO#: 0.7 10*3/uL (ref 0.1–0.9)
MONO%: 10.4 % (ref 0.0–13.0)
NEUT#: 4.6 10*3/uL (ref 1.5–6.5)
NEUT%: 65.7 % (ref 39.6–80.0)
PLATELETS: 443 10*3/uL — AB (ref 145–400)
RBC: 3.56 10*6/uL — AB (ref 3.70–5.32)
RDW: 18.6 % — AB (ref 11.1–15.7)
WBC: 7 10*3/uL (ref 3.9–10.0)

## 2015-11-27 MED ORDER — ANAGRELIDE HCL 1 MG PO CAPS: 5.0000 mg | ORAL_CAPSULE | Freq: Every day | ORAL | 4 refills | Status: DC
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
## 2015-11-28 LAB — FERRITIN: Ferritin: 215 ng/ml (ref 9–269)

## 2015-11-28 LAB — IRON AND TIBC
%SAT: 37 % (ref 21–57)
Iron: 74 ug/dL (ref 41–142)
TIBC: 199 ug/dL — AB (ref 236–444)
UIBC: 125 ug/dL (ref 120–384)

## 2015-11-28 LAB — VITAMIN D 25 HYDROXY (VIT D DEFICIENCY, FRACTURES): VIT D 25 HYDROXY: 26.8 ng/mL — AB (ref 30.0–100.0)

## 2015-12-03 ENCOUNTER — Other Ambulatory Visit: Payer: Self-pay | Admitting: Internal Medicine

## 2015-12-05 NOTE — Telephone Encounter (Signed)
Sent to the pharmacy by e-scribe. 

## 2015-12-05 NOTE — Telephone Encounter (Signed)
Ok to refill 6 months and can do 90 days if wishes

## 2015-12-09 ENCOUNTER — Other Ambulatory Visit: Payer: BC Managed Care – PPO

## 2015-12-27 ENCOUNTER — Emergency Department (HOSPITAL_COMMUNITY): Payer: Medicare Other

## 2015-12-27 ENCOUNTER — Emergency Department (HOSPITAL_COMMUNITY)
Admission: EM | Admit: 2015-12-27 | Discharge: 2015-12-27 | Disposition: A | Discharge status: Home / Self Care | Payer: Medicare Other | Attending: Emergency Medicine | Admitting: Emergency Medicine

## 2015-12-27 ENCOUNTER — Encounter (HOSPITAL_COMMUNITY): Payer: Self-pay | Admitting: Emergency Medicine

## 2015-12-27 DIAGNOSIS — Z7982 Long term (current) use of aspirin: Secondary | ICD-10-CM | POA: Diagnosis not present

## 2015-12-27 DIAGNOSIS — K7689 Other specified diseases of liver: Secondary | ICD-10-CM

## 2015-12-27 DIAGNOSIS — R109 Unspecified abdominal pain: Secondary | ICD-10-CM | POA: Diagnosis present

## 2015-12-27 DIAGNOSIS — Z79899 Other long term (current) drug therapy: Secondary | ICD-10-CM | POA: Diagnosis not present

## 2015-12-27 DIAGNOSIS — N1330 Unspecified hydronephrosis: Secondary | ICD-10-CM | POA: Insufficient documentation

## 2015-12-27 DIAGNOSIS — E119 Type 2 diabetes mellitus without complications: Secondary | ICD-10-CM | POA: Insufficient documentation

## 2015-12-27 DIAGNOSIS — R9389 Abnormal findings on diagnostic imaging of other specified body structures: Secondary | ICD-10-CM

## 2015-12-27 DIAGNOSIS — I10 Essential (primary) hypertension: Secondary | ICD-10-CM | POA: Insufficient documentation

## 2015-12-27 LAB — CBC
HCT: 30 % — ABNORMAL LOW (ref 36.0–46.0)
Hemoglobin: 9.5 g/dL — ABNORMAL LOW (ref 12.0–15.0)
MCH: 24.2 pg — AB (ref 26.0–34.0)
MCHC: 31.7 g/dL (ref 30.0–36.0)
MCV: 76.5 fL — AB (ref 78.0–100.0)
PLATELETS: 485 10*3/uL — AB (ref 150–400)
RBC: 3.92 MIL/uL (ref 3.87–5.11)
RDW: 19 % — ABNORMAL HIGH (ref 11.5–15.5)
WBC: 13.2 10*3/uL — ABNORMAL HIGH (ref 4.0–10.5)

## 2015-12-27 LAB — URINALYSIS, ROUTINE W REFLEX MICROSCOPIC
BILIRUBIN URINE: NEGATIVE
GLUCOSE, UA: NEGATIVE mg/dL
Ketones, ur: NEGATIVE mg/dL
Nitrite: NEGATIVE
PROTEIN: 30 mg/dL — AB
Specific Gravity, Urine: 1.01 (ref 1.005–1.030)
pH: 6 (ref 5.0–8.0)

## 2015-12-27 LAB — COMPREHENSIVE METABOLIC PANEL
ALBUMIN: 4.4 g/dL (ref 3.5–5.0)
ALK PHOS: 87 U/L (ref 38–126)
ALT: 17 U/L (ref 14–54)
AST: 28 U/L (ref 15–41)
Anion gap: 12 (ref 5–15)
BUN: 22 mg/dL — AB (ref 6–20)
CALCIUM: 9.2 mg/dL (ref 8.9–10.3)
CHLORIDE: 100 mmol/L — AB (ref 101–111)
CO2: 24 mmol/L (ref 22–32)
CREATININE: 1.6 mg/dL — AB (ref 0.44–1.00)
GFR calc Af Amer: 38 mL/min — ABNORMAL LOW (ref >60)
GFR calc non Af Amer: 33 mL/min — ABNORMAL LOW (ref >60)
GLUCOSE: 176 mg/dL — AB (ref 65–99)
Potassium: 3 mmol/L — ABNORMAL LOW (ref 3.5–5.1)
SODIUM: 136 mmol/L (ref 135–145)
Total Bilirubin: 1.1 mg/dL (ref 0.3–1.2)
Total Protein: 8 g/dL (ref 6.5–8.1)

## 2015-12-27 LAB — URINE MICROSCOPIC-ADD ON

## 2015-12-27 LAB — LIPASE, BLOOD: LIPASE: 49 U/L (ref 11–51)

## 2015-12-27 MED ORDER — SODIUM CHLORIDE 0.9 % IV BOLUS (SEPSIS)
1000.0000 mL | Freq: Once | INTRAVENOUS | Status: AC
  Administered 2015-12-27: 1000 mL via INTRAVENOUS

## 2015-12-27 MED ORDER — IBUPROFEN 600 MG PO TABS: 600.0000 mg | ORAL_TABLET | Freq: Four times a day (QID) | ORAL | 0 refills | Status: DC | PRN

## 2015-12-27 MED ORDER — HYDROCODONE-ACETAMINOPHEN 5-325 MG PO TABS: 1.0000 | ORAL_TABLET | Freq: Four times a day (QID) | ORAL | 0 refills | Status: DC | PRN

## 2015-12-27 NOTE — ED Triage Notes (Signed)
Patient reports lower abdominal pain which began yesterday.  States the pain has now moved to RLQ.  Reports nausea.  Denies vomiting, diarrhea, fevers, dysuria.

## 2015-12-27 NOTE — Discharge Instructions (Signed)
Your CT scan showed enlargement of right ureter. This could be due to a passed stone however, if symptoms do not improve, follow up with urology. Your CT scan also showed insidental finding of thickening of endometrium (lining of uterus.) Please follow up with OB/GYN for recheck. Take pain medications as prescribed as needed. Return if worsening symptoms of if develop fever.

## 2015-12-27 NOTE — ED Notes (Signed)
Patient transported to CT 

## 2015-12-27 NOTE — ED Provider Notes (Signed)
[facility] DEPT Provider Note   CSN: [id number] Arrival date & time: 12/27/15  1115     History   Chief Complaint Chief Complaint  Patient presents with  . Abdominal Pain    HPI Alexandra [name] is a 65 y.o. female.  HPI Alexandra [name] is a 65 y.o. female with history of thrombocytosis, diabetes, hypertension, anemia, presents to emergency room complaining of abdominal pain. Patient states her pain started yesterday afternoon. She reports pain in the right lower abdomen. States yesterday had anorexia, nausea, denies any vomiting. She states that for about 3 hours yesterday she had urinary symptoms which now results. States today she noticed some blood in her urine. She denies any back or flank pain. She denies any history of UTIs or history of kidney stones. She has not had any abdominal surgeries in the past except for ectopic pregnancy in 1984. She states that today her pain has improved, however neck area, nausea, hematuria continues. Did not take any medications prior to coming in.  Past Medical History:  Diagnosis Date  . Diabetes mellitus without complication ([place])   . Hypertension    echo   . Iron deficiency anemia, unspecified 03/15/2013  . Thrombocytosis [facility])     Patient Active Problem List   Diagnosis Date Noted  . Influenza vaccination declined 12/05/2013  . Absolute anemia 12/05/2013  . Iron deficiency anemia 03/15/2013  . Diabetes mellitus type 2, controlled, without complications ([place]) 01/65/5374  . Routine gynecological examination 08/24/2012  . Colon cancer screening 03/01/2011  . Visit for preventive health examination 02/27/2011  . Headache(784.0) 05/04/2010  . ABNORMAL ELECTROCARDIOGRAM 08/30/2008  . THROMBOCYTHEMIA 07/31/2008  . Vitamin D deficiency 07/31/2008  . HYPOKALEMIA, MILD 07/31/2008  . HYPERGLYCEMIA 07/31/2008  . Essential hypertension 10/18/2006    Past Surgical History:  Procedure Laterality Date  . BONE MARROW BIOPSY    .  ECTOPIC PREGNANCY SURGERY  1984    OB History    No data available       Home Medications    Prior to Admission medications   Medication Sig Start Date End Date Taking? Authorizing Provider  amLODipine (NORVASC) 5 MG tablet TAKE ONE TABLET BY MOUTH ONCE DAILY 06/17/15   Burnis Medin, MD  amLODipine (NORVASC) 5 MG tablet TAKE ONE TABLET BY MOUTH ONCE DAILY 12/05/15   Burnis Medin, MD  anagrelide (AGRYLIN) 1 MG capsule Take 5 capsules (5 mg total) by mouth daily. 11/27/15   [name], MD  aspirin 81 MG tablet Take 81 mg by mouth daily.    Historical Provider, MD  Fe Fum-FePoly-Vit C-Vit B3 (INTEGRA) 62.5-62.5-40-3 MG CAPS TAKE ONE CAPSULE BY MOUTH ONCE DAILY 11/13/15   [name], MD  glucose blood (ONE TOUCH ULTRA TEST) test strip Use to test blood sugar 1 times daily. **PT NEEDS FOLLOW UP APPT FOR FURTHER REFILLS** 04/01/15   Philemon Kingdom, MD  lisinopril (PRINIVIL,ZESTRIL) 20 MG tablet Take 2 tablets (40 mg total) by mouth daily. 06/13/15   Burnis Medin, MD  Nebivolol HCl (BYSTOLIC) 20 MG TABS Take 1 tablet (20 mg total) by mouth daily. 07/23/15   Burnis Medin, MD  [facility] DELICA LANCETS 82L MISC Use to test blood sugar 1 time daily. **PT NEEDS FOLLOW UP APPT FOR FURTHER REFILLS** 04/01/15   Philemon Kingdom, MD  Vitamin D, Ergocalciferol, (DRISDOL) 50000 units CAPS capsule Take 1 capsule (50,000 Units total) by mouth once a week. 11/18/15   [name], MD  Family History Family History  Problem Relation Age of Onset  . Heart disease Mother     died age 49  . Kidney disease Mother     family hx  . Heart attack Father 24    massive  . Hypertension Sister   . Hypertension Sister   . Hypertension Sister   . Hypertension Sister   . Arthritis      family hx  . Diabetes      family hx  . Stroke       dialysis    Social History Social History  Substance Use Topics  . Smoking status: Never Smoker  . Smokeless tobacco: Never Used     Comment: never used  tobacco  . Alcohol use No     Allergies   Review of patient's allergies indicates no known allergies.   Review of Systems Review of Systems  Constitutional: Negative for chills and fever.  Respiratory: Negative for cough, chest tightness and shortness of breath.   Cardiovascular: Negative for chest pain, palpitations and leg swelling.  Gastrointestinal: Positive for abdominal pain and nausea. Negative for blood in stool, diarrhea and vomiting.  Genitourinary: Positive for dysuria, frequency and hematuria. Negative for flank pain, pelvic pain, vaginal bleeding, vaginal discharge and vaginal pain.  Musculoskeletal: Negative for arthralgias, myalgias, neck pain and neck stiffness.  Skin: Negative for rash.  Neurological: Negative for dizziness, weakness and headaches.  All other systems reviewed and are negative.    Physical Exam Updated Vital Signs BP 158/92 (BP Location: Left Arm)   Pulse 102   Temp 98.7 F (37.1 C) (Oral)   Resp 16   Ht '5\' 11"'  (1.803 m)   Wt 69.4 kg   SpO2 100%   BMI 21.34 kg/m   Physical Exam  Constitutional: She is oriented to person, place, and time. She appears well-developed and well-nourished. No distress.  HENT:  Head: Normocephalic and atraumatic.  Eyes: Conjunctivae and EOM are normal. Pupils are equal, round, and reactive to light.  Neck: Normal range of motion. Neck supple.  Cardiovascular: Normal rate, regular rhythm and normal heart sounds.   Pulmonary/Chest: Effort normal and breath sounds normal. No respiratory distress. She has no wheezes. She has no rales.  Abdominal: Soft. Bowel sounds are normal. She exhibits no distension and no mass. There is tenderness. There is no guarding.  Mild RLQ tenderness  Musculoskeletal: Normal range of motion. She exhibits no edema.  Neurological: She is alert and oriented to person, place, and time. No cranial nerve deficit. Coordination normal.  Skin: Skin is warm and dry.  Psychiatric: She has a  normal mood and affect. Her behavior is normal.  Nursing note and vitals reviewed.    ED Treatments / Results  Labs (all labs ordered are listed, but only abnormal results are displayed) Labs Reviewed  COMPREHENSIVE METABOLIC PANEL - Abnormal; Notable for the following:       Result Value   Potassium 3.0 (*)    Chloride 100 (*)    Glucose, Bld 176 (*)    BUN 22 (*)    Creatinine, Ser 1.60 (*)    GFR calc non Af Amer 33 (*)    GFR calc Af Amer 38 (*)    All other components within normal limits  CBC - Abnormal; Notable for the following:    WBC 13.2 (*)    Hemoglobin 9.5 (*)    HCT 30.0 (*)    MCV 76.5 (*)    MCH 24.2 (*)  RDW 19.0 (*)    Platelets 485 (*)    All other components within normal limits  LIPASE, BLOOD  URINALYSIS, ROUTINE W REFLEX MICROSCOPIC (NOT AT [facility])    EKG  EKG Interpretation None       Radiology Ct Renal Stone Study  Result Date: 12/27/2015 CLINICAL DATA:  Patient with hematuria and right abdominal pain. EXAM: CT ABDOMEN AND PELVIS WITHOUT CONTRAST TECHNIQUE: Multidetector CT imaging of the abdomen and pelvis was performed following the standard protocol without IV contrast. COMPARISON:  None. FINDINGS: Lower chest: Normal heart size. Small pericardial effusion. Minimal atelectasis within the lower lobes bilaterally. No pleural effusion. Hepatobiliary: There is a 3.2 cm cyst within the left hepatic lobe (image 14; series 2). There is a 3.5 cm cyst within the right hepatic lobe (image 31; series 2). Too small to characterize 8 mm low-attenuation lesion left hepatic lobe (image 29; series 2). Gallbladder is unremarkable. Pancreas: Unremarkable Spleen: Unremarkable Adrenals/Urinary Tract: Adrenal glands are normal. There is moderate right hydroureteronephrosis to the level of the urinary bladder. No definite stone is identified within the ureter or bladder. Urinary bladder is unremarkable. Left kidney is unremarkable. Stomach/Bowel: Normal morphology of  the stomach. No free fluid or free intraperitoneal air. No evidence for obstruction. Vascular/Lymphatic: Peripheral calcified atherosclerotic plaque. No retroperitoneal lymphadenopathy. Reproductive: There is suggestion of endometrial thickening. Adnexal structures are unremarkable. Other: None. Musculoskeletal: Lumbar spine degenerative changes. No aggressive or acute appearing osseous lesions. IMPRESSION: Moderate right hydronephrosis to the level of the urinary bladder. No obstructing stone is identified. Findings are nonspecific however may be secondary to recently passed stone. Superimposed infection not excluded. Aortic atherosclerosis. Suggestion of endometrial thickening. Recommend correlation with pelvic ultrasound in the non acute setting. Electronically Signed   By: Lovey Newcomer M.D.   On: 12/27/2015 14:48    Procedures Procedures (including critical care time)  Medications Ordered in ED Medications  sodium chloride 0.9 % bolus 1,000 mL (1,000 mLs Intravenous New Bag/Given 12/27/15 1448)     Initial Impression / Assessment and Plan / ED Course  I have reviewed the triage vital signs and the nursing notes.  Pertinent labs & imaging results that were available during my care of the patient were reviewed by me and considered in my medical decision making (see chart for details).  Clinical Course   Patient emergency department with right lower quadrant pain, hematuria, urinary frequency yesterday not resolved. Patient states pain is improving. Lab work already back, uptake by triage nurse, showing elevated renal function as well as elevated white blood cell count. UA pending.  3:36 PM Patient's urinalysis shows turbid appearance of the urine, with large hemoglobin into numerous to count red blood cells. Patient's CT scan shows right hydronephrosis and hydroureter. Questionable past kidney stone versus another obstruction. There is no evidence of infection. Patient states she is feeling  much better. I do question probable passed stone. I will discharge her home however with some pain medicine only as needed, follow up with urology. Instructed to return if pain is worsening, or if develops fever. Patient voiced understanding.  Vitals:   12/27/15 1134 12/27/15 1135 12/27/15 1356  BP: 158/92  (!) 171/105  Pulse: 102  80  Resp: 16  18  Temp: 98.7 F (37.1 C)    TempSrc: Oral    SpO2: 100%  99%  Weight:  69.4 kg   Height:  '5\' 11"'  (1.803 m)     Final Clinical Impressions(s) / ED Diagnoses   Final diagnoses:  Hydroureteronephrosis  Liver cyst  Thickened endometrium    New Prescriptions New Prescriptions   HYDROCODONE-ACETAMINOPHEN (NORCO) 5-325 MG TABLET    Take 1 tablet by mouth every 6 (six) hours as needed for moderate pain.   IBUPROFEN (ADVIL,MOTRIN) 600 MG TABLET    Take 1 tablet (600 mg total) by mouth every 6 (six) hours as needed.     Jeannett Senior, PA-C 12/27/15 [place], MD 12/27/15 1947

## 2015-12-28 LAB — URINE CULTURE

## 2016-01-08 ENCOUNTER — Other Ambulatory Visit: Payer: BC Managed Care – PPO

## 2016-01-09 ENCOUNTER — Telehealth: Payer: Self-pay | Admitting: Family Medicine

## 2016-01-09 ENCOUNTER — Other Ambulatory Visit: Payer: Self-pay | Admitting: Family Medicine

## 2016-01-09 DIAGNOSIS — E119 Type 2 diabetes mellitus without complications: Secondary | ICD-10-CM

## 2016-01-09 MED ORDER — LISINOPRIL 20 MG PO TABS: 40.0000 mg | ORAL_TABLET | Freq: Every day | ORAL | 0 refills | Status: DC

## 2016-01-09 NOTE — Telephone Encounter (Signed)
Pt is past due for A1C.  Please help her to make that appointment.  She does not need to fast.  I have placed the order.  Thanks!!

## 2016-01-09 NOTE — Telephone Encounter (Signed)
lmom for pt to call back

## 2016-01-09 NOTE — Telephone Encounter (Signed)
Sent to the pharmacy by e-scribe for 90 days.  Pt is past due for A1C.  Message sent to scheduling.  Due in March for yearly.

## 2016-01-15 NOTE — Telephone Encounter (Signed)
LMOM for pt to callack

## 2016-01-17 NOTE — Telephone Encounter (Signed)
Pt will callback after thanksgiving to sch

## 2016-01-24 ENCOUNTER — Other Ambulatory Visit: Payer: Self-pay | Admitting: Internal Medicine

## 2016-01-27 NOTE — Telephone Encounter (Signed)
Sent to the pharmacy by e-scribe for 90 days.  Pt coming in on 02/03/16 for A1C.

## 2016-01-29 ENCOUNTER — Other Ambulatory Visit (HOSPITAL_BASED_OUTPATIENT_CLINIC_OR_DEPARTMENT_OTHER): Payer: Medicare Other

## 2016-01-29 ENCOUNTER — Ambulatory Visit (HOSPITAL_BASED_OUTPATIENT_CLINIC_OR_DEPARTMENT_OTHER): Payer: Medicare Other | Admitting: Hematology & Oncology

## 2016-01-29 VITALS — BP 155/87 | HR 83 | Temp 97.8°F | Resp 16 | Wt 150.0 lb

## 2016-01-29 DIAGNOSIS — D473 Essential (hemorrhagic) thrombocythemia: Secondary | ICD-10-CM

## 2016-01-29 DIAGNOSIS — D509 Iron deficiency anemia, unspecified: Secondary | ICD-10-CM

## 2016-01-29 LAB — CBC WITH DIFFERENTIAL (CANCER CENTER ONLY)
BASO#: 0.1 10*3/uL (ref 0.0–0.2)
BASO%: 0.9 % (ref 0.0–2.0)
EOS ABS: 0.1 10*3/uL (ref 0.0–0.5)
EOS%: 1.5 % (ref 0.0–7.0)
HEMATOCRIT: 28.3 % — AB (ref 34.8–46.6)
HEMOGLOBIN: 9.1 g/dL — AB (ref 11.6–15.9)
LYMPH#: 1.6 10*3/uL (ref 0.9–3.3)
LYMPH%: 24.1 % (ref 14.0–48.0)
MCH: 25.6 pg — AB (ref 26.0–34.0)
MCHC: 32.2 g/dL (ref 32.0–36.0)
MCV: 80 fL — AB (ref 81–101)
MONO#: 0.6 10*3/uL (ref 0.1–0.9)
MONO%: 9.4 % (ref 0.0–13.0)
NEUT%: 64.1 % (ref 39.6–80.0)
NEUTROS ABS: 4.3 10*3/uL (ref 1.5–6.5)
Platelets: 553 10*3/uL — ABNORMAL HIGH (ref 145–400)
RBC: 3.55 10*6/uL — AB (ref 3.70–5.32)
RDW: 18.9 % — ABNORMAL HIGH (ref 11.1–15.7)
WBC: 6.7 10*3/uL (ref 3.9–10.0)

## 2016-01-29 LAB — COMPREHENSIVE METABOLIC PANEL
ALT: 14 U/L (ref 0–55)
AST: 16 U/L (ref 5–34)
Albumin: 3.6 g/dL (ref 3.5–5.0)
Alkaline Phosphatase: 112 U/L (ref 40–150)
Anion Gap: 8 mEq/L (ref 3–11)
BUN: 24.2 mg/dL (ref 7.0–26.0)
CHLORIDE: 105 meq/L (ref 98–109)
CO2: 26 meq/L (ref 22–29)
CREATININE: 1.1 mg/dL (ref 0.6–1.1)
Calcium: 9.3 mg/dL (ref 8.4–10.4)
EGFR: 63 mL/min/{1.73_m2} — ABNORMAL LOW (ref >90)
Glucose: 141 mg/dl — ABNORMAL HIGH (ref 70–140)
POTASSIUM: 3.3 meq/L — AB (ref 3.5–5.1)
SODIUM: 139 meq/L (ref 136–145)
Total Bilirubin: 0.57 mg/dL (ref 0.20–1.20)
Total Protein: 7.7 g/dL (ref 6.4–8.3)

## 2016-01-29 LAB — IRON AND TIBC
%SAT: 35 % (ref 21–57)
Iron: 80 ug/dL (ref 41–142)
TIBC: 228 ug/dL — AB (ref 236–444)
UIBC: 147 ug/dL (ref 120–384)

## 2016-01-29 LAB — FERRITIN: Ferritin: 242 ng/ml (ref 9–269)

## 2016-01-29 LAB — LACTATE DEHYDROGENASE: LDH: 248 U/L — ABNORMAL HIGH (ref 125–245)

## 2016-01-29 NOTE — Progress Notes (Signed)
Hematology and Oncology Follow Up Visit  Alexandra [name] [id number] 1950/10/07 65 y.o. 01/29/2016   Principle Diagnosis:   Anagrelide 5 mg by mouth daily - dose changed today  Aspirin 81 mg by mouth daily  Current Therapy:    Essential thrombocythemia-Calreticulin positive       Interim History:  [name] is back for followup. She had a kidney stone on the right side since we last saw her. She had a CT scan done. This is done on October 27. Thankfully, there is no splenomegaly. She had a couple hepatic cysts. Everything else looked fine. She had some hematuria. She passed the kidney stone.  She has not taken that her neck a lot for about 5 days. She ran out of this. She has a prescription ready to pick up. She will get this restarted today.  She had a nice Thanksgiving. She was at home. She probably will be in [place], [place] with her family for Christmas.  She is enjoying retirement. She is exercising. She is keeping busy. She really is enjoying her time off.    Overall, her performance status is ECOG 0.  Medications:  Current Outpatient Prescriptions:  .  amLODipine (NORVASC) 5 MG tablet, TAKE ONE TABLET BY MOUTH ONCE DAILY, Disp: 30 tablet, Rfl: 5 .  anagrelide (AGRYLIN) 1 MG capsule, Take 5 capsules (5 mg total) by mouth daily. (Patient taking differently: Take 2-3 mg by mouth 2 (two) times daily. Takes 3 in the morning and 2 in the evening), Disp: 150 capsule, Rfl: 4 .  aspirin 81 MG tablet, Take 81 mg by mouth daily., Disp: , Rfl:  .  BYSTOLIC 20 MG TABS, TAKE ONE TABLET BY MOUTH ONCE DAILY, Disp: 90 tablet, Rfl: 0 .  Fe Fum-FePoly-Vit C-Vit B3 (INTEGRA) 62.5-62.5-40-3 MG CAPS, TAKE ONE CAPSULE BY MOUTH ONCE DAILY, Disp: 30 capsule, Rfl: 3 .  glucose blood (ONE TOUCH ULTRA TEST) test strip, Use to test blood sugar 1 times daily. **PT NEEDS FOLLOW UP APPT FOR FURTHER REFILLS**, Disp: 100 each, Rfl: 0 .  HYDROcodone-acetaminophen (NORCO) 5-325 MG tablet, Take 1 tablet by  mouth every 6 (six) hours as needed for moderate pain., Disp: 15 tablet, Rfl: 0 .  ibuprofen (ADVIL,MOTRIN) 600 MG tablet, Take 1 tablet (600 mg total) by mouth every 6 (six) hours as needed., Disp: 30 tablet, Rfl: 0 .  lisinopril (PRINIVIL,ZESTRIL) 20 MG tablet, Take 2 tablets (40 mg total) by mouth daily., Disp: 180 tablet, Rfl: 0 .  ONETOUCH DELICA LANCETS 99991111 MISC, Use to test blood sugar 1 time daily. **PT NEEDS FOLLOW UP APPT FOR FURTHER REFILLS**, Disp: 100 each, Rfl: 1 .  Vitamin D, Ergocalciferol, (DRISDOL) 50000 units CAPS capsule, Take 1 capsule (50,000 Units total) by mouth once a week., Disp: 12 capsule, Rfl: 3  Allergies: No Known Allergies  Past Medical History, Surgical history, Social history, and Family History were reviewed and updated.  Review of Systems: As above  Physical Exam:  weight is 150 lb (68 kg). Her oral temperature is 97.8 F (36.6 C). Her blood pressure is 155/87 (abnormal) and her pulse is 83. Her respiration is 16.   Well-developed and well-nourished African American female. Head and neck exam shows no ocular or oral lesions. There are no palpable cervical or supraclavicular lymph nodes. Lungs are clear. Cardiac exam regular rate and rhythm with no murmurs rubs or bruits. Abdomen is soft. She is good bowel sounds. There is no fluid wave. There is no palpable liver  or spleen tip. Back exam shows no tenderness over the spine ribs or hips. Extremities shows no clubbing cyanosis or edema. Skin exam shows no erythema on the palms of her hand. She no ecchymoses or petechia. Neurological exam is nonfocal.  Lab Results  Component Value Date   WBC 6.7 01/29/2016   HGB 9.1 (L) 01/29/2016   HCT 28.3 (L) 01/29/2016   MCV 80 (L) 01/29/2016   PLT 553 Platelet count consistent in citrate (H) 01/29/2016     Chemistry      Component Value Date/Time   NA 136 12/27/2015 1139   NA 140 11/27/2015 1221   K 3.0 (L) 12/27/2015 1139   K 3.5 11/27/2015 1221   CL 100 (L)  12/27/2015 1139   CL 102 09/28/2014 1133   CO2 24 12/27/2015 1139   CO2 24 11/27/2015 1221   BUN 22 (H) 12/27/2015 1139   BUN 21.3 11/27/2015 1221   CREATININE 1.60 (H) 12/27/2015 1139   CREATININE 0.9 11/27/2015 1221      Component Value Date/Time   CALCIUM 9.2 12/27/2015 1139   CALCIUM 9.3 11/27/2015 1221   ALKPHOS 87 12/27/2015 1139   ALKPHOS 117 11/27/2015 1221   AST 28 12/27/2015 1139   AST 18 11/27/2015 1221   ALT 17 12/27/2015 1139   ALT 17 11/27/2015 1221   BILITOT 1.1 12/27/2015 1139   BILITOT 0.60 11/27/2015 1221         Impression and Plan: Alexandra [name] is 65 year old [name]. She has essential thrombocythemia. She actually is Calreticulin positive.  Her platelet count has been going up. Some of this might be from the kidney stone and the platelet count being an acute phase reactant. It also might be that she is not taking the anagrelide for 5 days.   I am encouraged that the CT scan did not show any splenomegaly a month ago.  We probably have to follow her closely. I will like to see her back in 6 weeks. I think this would be reasonable.  Marland Kitchen [name], MD 11/29/201711:36 AM

## 2016-02-03 ENCOUNTER — Other Ambulatory Visit (INDEPENDENT_AMBULATORY_CARE_PROVIDER_SITE_OTHER): Payer: Medicare Other

## 2016-02-03 DIAGNOSIS — E119 Type 2 diabetes mellitus without complications: Secondary | ICD-10-CM | POA: Diagnosis not present

## 2016-02-03 LAB — HEMOGLOBIN A1C: HEMOGLOBIN A1C: 6.3 % (ref 4.6–6.5)

## 2016-02-07 ENCOUNTER — Other Ambulatory Visit: Payer: BC Managed Care – PPO

## 2016-03-09 ENCOUNTER — Other Ambulatory Visit: Payer: BC Managed Care – PPO

## 2016-03-11 ENCOUNTER — Other Ambulatory Visit (HOSPITAL_BASED_OUTPATIENT_CLINIC_OR_DEPARTMENT_OTHER): Payer: Medicare Other

## 2016-03-11 ENCOUNTER — Ambulatory Visit (HOSPITAL_BASED_OUTPATIENT_CLINIC_OR_DEPARTMENT_OTHER): Payer: Medicare Other | Admitting: Hematology & Oncology

## 2016-03-11 VITALS — BP 155/82 | HR 99 | Temp 98.3°F | Resp 18 | Wt 151.8 lb

## 2016-03-11 DIAGNOSIS — D473 Essential (hemorrhagic) thrombocythemia: Secondary | ICD-10-CM

## 2016-03-11 DIAGNOSIS — E860 Dehydration: Secondary | ICD-10-CM

## 2016-03-11 DIAGNOSIS — D501 Sideropenic dysphagia: Secondary | ICD-10-CM

## 2016-03-11 LAB — CBC WITH DIFFERENTIAL (CANCER CENTER ONLY)
BASO#: 0.1 10*3/uL (ref 0.0–0.2)
BASO%: 0.7 % (ref 0.0–2.0)
EOS ABS: 0.1 10*3/uL (ref 0.0–0.5)
EOS%: 1.3 % (ref 0.0–7.0)
HEMATOCRIT: 29.4 % — AB (ref 34.8–46.6)
HEMOGLOBIN: 9.3 g/dL — AB (ref 11.6–15.9)
LYMPH#: 1.8 10*3/uL (ref 0.9–3.3)
LYMPH%: 21.4 % (ref 14.0–48.0)
MCH: 25.6 pg — ABNORMAL LOW (ref 26.0–34.0)
MCHC: 31.6 g/dL — ABNORMAL LOW (ref 32.0–36.0)
MCV: 81 fL (ref 81–101)
MONO#: 0.9 10*3/uL (ref 0.1–0.9)
MONO%: 11.1 % (ref 0.0–13.0)
NEUT%: 65.5 % (ref 39.6–80.0)
NEUTROS ABS: 5.4 10*3/uL (ref 1.5–6.5)
Platelets: 653 10*3/uL — ABNORMAL HIGH (ref 145–400)
RBC: 3.63 10*6/uL — AB (ref 3.70–5.32)
RDW: 18.8 % — ABNORMAL HIGH (ref 11.1–15.7)
WBC: 8.3 10*3/uL (ref 3.9–10.0)

## 2016-03-11 LAB — CHCC SATELLITE - SMEAR

## 2016-03-11 NOTE — Progress Notes (Signed)
Hematology and Oncology Follow Up Visit  Alexandra [name] [id number] 03-Nov-1950 66 y.o. 03/11/2016   Principle Diagnosis:   Anagrelide 5 mg by mouth daily - dose changed today  Aspirin 81 mg by mouth daily  Current Therapy:    Essential thrombocythemia-Calreticulin positive       Interim History:  [name] is back for followup. She had a very nice Christmas. She was busy visiting family.  She is still enjoying retirement. She really enjoys having a lot of free time.   She says that she is taking the anagrelide. We will last saw her, she is not taking anagrelide on a regular basis. She said that she now is taking the anagrelide daily.   We last saw her, her iron studies showed a ferritin of 242 with iron saturation of 35%.   She is eating well. She is having no problems with bowels or bladder. She's had no rashes. She's had no leg swelling. She's had no cough.    Overall, her performance status is ECOG 0.  Medications:  Current Outpatient Prescriptions:  .  amLODipine (NORVASC) 5 MG tablet, TAKE ONE TABLET BY MOUTH ONCE DAILY, Disp: 30 tablet, Rfl: 5 .  anagrelide (AGRYLIN) 1 MG capsule, Take 5 capsules (5 mg total) by mouth daily. (Patient taking differently: Take 2-3 mg by mouth 2 (two) times daily. Takes 3 in the morning and 2 in the evening), Disp: 150 capsule, Rfl: 4 .  aspirin 81 MG tablet, Take 81 mg by mouth daily., Disp: , Rfl:  .  BYSTOLIC 20 MG TABS, TAKE ONE TABLET BY MOUTH ONCE DAILY, Disp: 90 tablet, Rfl: 0 .  Fe Fum-FePoly-Vit C-Vit B3 (INTEGRA) 62.5-62.5-40-3 MG CAPS, TAKE ONE CAPSULE BY MOUTH ONCE DAILY, Disp: 30 capsule, Rfl: 3 .  glucose blood (ONE TOUCH ULTRA TEST) test strip, Use to test blood sugar 1 times daily. **PT NEEDS FOLLOW UP APPT FOR FURTHER REFILLS**, Disp: 100 each, Rfl: 0 .  lisinopril (PRINIVIL,ZESTRIL) 20 MG tablet, Take 2 tablets (40 mg total) by mouth daily., Disp: 180 tablet, Rfl: 0 .  ONETOUCH DELICA LANCETS 93X MISC, Use to test blood  sugar 1 time daily. **PT NEEDS FOLLOW UP APPT FOR FURTHER REFILLS**, Disp: 100 each, Rfl: 1 .  Vitamin D, Ergocalciferol, (DRISDOL) 50000 units CAPS capsule, Take 1 capsule (50,000 Units total) by mouth once a week., Disp: 12 capsule, Rfl: 3  Allergies: No Known Allergies  Past Medical History, Surgical history, Social history, and Family History were reviewed and updated.  Review of Systems: As above  Physical Exam:  weight is 151 lb 12.8 oz (68.9 kg). Her oral temperature is 98.3 F (36.8 C). Her blood pressure is 155/82 (abnormal) and her pulse is 99. Her respiration is 18 and oxygen saturation is 99%.   Well-developed and well-nourished African American female. Head and neck exam shows no ocular or oral lesions. There are no palpable cervical or supraclavicular lymph nodes. Lungs are clear. Cardiac exam regular rate and rhythm with no murmurs rubs or bruits. Abdomen is soft. She is good bowel sounds. There is no fluid wave. There is no palpable liver or spleen tip. Back exam shows no tenderness over the spine ribs or hips. Extremities shows no clubbing cyanosis or edema. Skin exam shows no erythema on the palms of her hand. She no ecchymoses or petechia. Neurological exam is nonfocal.  Lab Results  Component Value Date   WBC 8.3 03/11/2016   HGB 9.3 (L) 03/11/2016   HCT  29.4 (L) 03/11/2016   MCV 81 03/11/2016   PLT 653 (H) 03/11/2016     Chemistry      Component Value Date/Time   NA 139 01/29/2016 1037   K 3.3 (L) 01/29/2016 1037   CL 100 (L) 12/27/2015 1139   CL 102 09/28/2014 1133   CO2 26 01/29/2016 1037   BUN 24.2 01/29/2016 1037   CREATININE 1.1 01/29/2016 1037      Component Value Date/Time   CALCIUM 9.3 01/29/2016 1037   ALKPHOS 112 01/29/2016 1037   AST 16 01/29/2016 1037   ALT 14 01/29/2016 1037   BILITOT 0.57 01/29/2016 1037         Impression and Plan: Alexandra [name] is 66 year old [name]. She has essential thrombocythemia. She actually is  Calreticulin positive.  Her platelet count is still going up. She says that she is taking her medication with the anagrelide.  She is alone dehydrated. I told her that dehydration could certainly cause the platelet count to go up. I really encouraged her to take more fluid.   I will have to get her back in 1 month now.  Her last bone marrow test was back in December 2014. It is possible it we may have to consider another bone marrow biopsy on her.   I may need to add Hydrea to the anagrelide if her platelet count continues to increase.   We will see what her iron studies show.   I spent about 25 minutes with her. I went over her lab work. I explained her that her dehydration could be a factor. She understands this.   [name], MD 1/10/20184:35 PM

## 2016-03-12 ENCOUNTER — Telehealth: Payer: Self-pay | Admitting: *Deleted

## 2016-03-12 LAB — CMP (CANCER CENTER ONLY)
ALBUMIN: 3.7 g/dL (ref 3.3–5.5)
ALT(SGPT): 20 U/L (ref 10–47)
AST: 22 U/L (ref 11–38)
Alkaline Phosphatase: 99 U/L — ABNORMAL HIGH (ref 26–84)
BUN, Bld: 25 mg/dL — ABNORMAL HIGH (ref 7–22)
CALCIUM: 9.1 mg/dL (ref 8.0–10.3)
CHLORIDE: 99 meq/L (ref 98–108)
CO2: 28 meq/L (ref 18–33)
Creat: 1.2 mg/dl (ref 0.6–1.2)
Glucose, Bld: 113 mg/dL (ref 73–118)
Potassium: 4.1 mEq/L (ref 3.3–4.7)
SODIUM: 141 meq/L (ref 128–145)
Total Bilirubin: 0.8 mg/dl (ref 0.20–1.60)
Total Protein: 7.5 g/dL (ref 6.4–8.1)

## 2016-03-12 LAB — FERRITIN: Ferritin: 260 ng/ml (ref 9–269)

## 2016-03-12 LAB — IRON AND TIBC
%SAT: 36 % (ref 21–57)
IRON: 81 ug/dL (ref 41–142)
TIBC: 226 ug/dL — AB (ref 236–444)
UIBC: 145 ug/dL (ref 120–384)

## 2016-03-12 NOTE — Telephone Encounter (Addendum)
Message left on personal voice mail  ----- Message from [name], MD sent at 03/12/2016  1:35 PM EST ----- Call - iron is ok!!  pete

## 2016-04-16 ENCOUNTER — Other Ambulatory Visit: Payer: Medicare Other

## 2016-04-16 ENCOUNTER — Ambulatory Visit: Payer: Medicare Other | Admitting: Hematology & Oncology

## 2016-04-17 ENCOUNTER — Other Ambulatory Visit (HOSPITAL_BASED_OUTPATIENT_CLINIC_OR_DEPARTMENT_OTHER): Payer: Medicare Other

## 2016-04-17 ENCOUNTER — Ambulatory Visit (HOSPITAL_BASED_OUTPATIENT_CLINIC_OR_DEPARTMENT_OTHER): Payer: Medicare Other | Admitting: Hematology & Oncology

## 2016-04-17 DIAGNOSIS — D473 Essential (hemorrhagic) thrombocythemia: Secondary | ICD-10-CM

## 2016-04-17 DIAGNOSIS — D508 Other iron deficiency anemias: Secondary | ICD-10-CM

## 2016-04-17 DIAGNOSIS — D501 Sideropenic dysphagia: Secondary | ICD-10-CM

## 2016-04-17 LAB — IRON AND TIBC
%SAT: 34 % (ref 21–57)
Iron: 76 ug/dL (ref 41–142)
TIBC: 225 ug/dL — ABNORMAL LOW (ref 236–444)
UIBC: 149 ug/dL (ref 120–384)

## 2016-04-17 LAB — CBC WITH DIFFERENTIAL (CANCER CENTER ONLY)
BASO#: 0.1 10*3/uL (ref 0.0–0.2)
BASO%: 0.7 % (ref 0.0–2.0)
EOS ABS: 0.1 10*3/uL (ref 0.0–0.5)
EOS%: 1.9 % (ref 0.0–7.0)
HEMATOCRIT: 28.6 % — AB (ref 34.8–46.6)
HGB: 9.2 g/dL — ABNORMAL LOW (ref 11.6–15.9)
LYMPH#: 1.8 10*3/uL (ref 0.9–3.3)
LYMPH%: 24 % (ref 14.0–48.0)
MCH: 25.8 pg — AB (ref 26.0–34.0)
MCHC: 32.2 g/dL (ref 32.0–36.0)
MCV: 80 fL — AB (ref 81–101)
MONO#: 0.7 10*3/uL (ref 0.1–0.9)
MONO%: 9.7 % (ref 0.0–13.0)
NEUT%: 63.7 % (ref 39.6–80.0)
NEUTROS ABS: 4.7 10*3/uL (ref 1.5–6.5)
Platelets: 596 10*3/uL — ABNORMAL HIGH (ref 145–400)
RBC: 3.56 10*6/uL — ABNORMAL LOW (ref 3.70–5.32)
RDW: 18.8 % — ABNORMAL HIGH (ref 11.1–15.7)
WBC: 7.4 10*3/uL (ref 3.9–10.0)

## 2016-04-17 LAB — COMPREHENSIVE METABOLIC PANEL
ALT: 18 U/L (ref 0–55)
AST: 17 U/L (ref 5–34)
Albumin: 3.8 g/dL (ref 3.5–5.0)
Alkaline Phosphatase: 122 U/L (ref 40–150)
Anion Gap: 9 mEq/L (ref 3–11)
BUN: 24.8 mg/dL (ref 7.0–26.0)
CALCIUM: 9.4 mg/dL (ref 8.4–10.4)
CHLORIDE: 106 meq/L (ref 98–109)
CO2: 25 mEq/L (ref 22–29)
Creatinine: 1 mg/dL (ref 0.6–1.1)
EGFR: 65 mL/min/{1.73_m2} — ABNORMAL LOW (ref >90)
GLUCOSE: 132 mg/dL (ref 70–140)
POTASSIUM: 3.6 meq/L (ref 3.5–5.1)
SODIUM: 140 meq/L (ref 136–145)
Total Bilirubin: 0.5 mg/dL (ref 0.20–1.20)
Total Protein: 7.4 g/dL (ref 6.4–8.3)

## 2016-04-17 LAB — CHCC SATELLITE - SMEAR

## 2016-04-17 LAB — LACTATE DEHYDROGENASE: LDH: 264 U/L — ABNORMAL HIGH (ref 125–245)

## 2016-04-17 LAB — FERRITIN: Ferritin: 231 ng/ml (ref 9–269)

## 2016-04-17 MED ORDER — ANAGRELIDE HCL 1 MG PO CAPS: 5.0000 mg | ORAL_CAPSULE | Freq: Every day | ORAL | 4 refills | Status: DC

## 2016-04-17 NOTE — Progress Notes (Signed)
Hematology and Oncology Follow Up Visit  DANEY REVOIR [id number] [birth year] 66 y.o. 04/17/2016   Principle Diagnosis:   Anagrelide 5 mg by mouth daily - dose changed today  Aspirin 81 mg by mouth daily  Current Therapy:    Essential thrombocythemia-Calreticulin positive       Interim History:  Ms.  Dausch is back for followup. She is doing well. She's had no problems since we last saw her. Her husband had influenza but thankfully, she did not get this.  She's doing well with the anagrelide. She's having no problems with rashes. There is no leg swelling. She's having no diarrhea. She's having no cough.  She is on iron supplementation. Her iron studies have been good. Her ferritin was 260 with iron saturation of 36%. This is back in January.  Her appetite is doing okay. She's having no nausea. She's having no fever.   Overall, her performance status is ECOG 0.  Medications:  Current Outpatient Prescriptions:  .  amLODipine (NORVASC) 5 MG tablet, TAKE ONE TABLET BY MOUTH ONCE DAILY, Disp: 30 tablet, Rfl: 5 .  anagrelide (AGRYLIN) 1 MG capsule, Take 5 capsules (5 mg total) by mouth daily. (Patient taking differently: Take 2-3 mg by mouth 2 (two) times daily. Takes 3 in the morning and 2 in the evening), Disp: 150 capsule, Rfl: 4 .  aspirin 81 MG tablet, Take 81 mg by mouth daily., Disp: , Rfl:  .  BYSTOLIC 20 MG TABS, TAKE ONE TABLET BY MOUTH ONCE DAILY, Disp: 90 tablet, Rfl: 0 .  Fe Fum-FePoly-Vit C-Vit B3 (INTEGRA) 62.5-62.5-40-3 MG CAPS, TAKE ONE CAPSULE BY MOUTH ONCE DAILY, Disp: 30 capsule, Rfl: 3 .  glucose blood (ONE TOUCH ULTRA TEST) test strip, Use to test blood sugar 1 times daily. **PT NEEDS FOLLOW UP APPT FOR FURTHER REFILLS**, Disp: 100 each, Rfl: 0 .  lisinopril (PRINIVIL,ZESTRIL) 20 MG tablet, Take 2 tablets (40 mg total) by mouth daily., Disp: 180 tablet, Rfl: 0 .  ONETOUCH DELICA LANCETS 99991111 MISC, Use to test blood sugar 1 time daily. **PT NEEDS FOLLOW UP APPT FOR  FURTHER REFILLS**, Disp: 100 each, Rfl: 1 .  Vitamin D, Ergocalciferol, (DRISDOL) 50000 units CAPS capsule, Take 1 capsule (50,000 Units total) by mouth once a week., Disp: 12 capsule, Rfl: 3  Allergies: No Known Allergies  Past Medical History, Surgical history, Social history, and Family History were reviewed and updated.  Review of Systems: As above  Physical Exam:  height is 5\' 11"  (1.803 m) and weight is 153 lb 12.8 oz (69.8 kg). Her oral temperature is 97.7 F (36.5 C). Her blood pressure is 156/89 (abnormal) and her pulse is 83. Her respiration is 18 and oxygen saturation is 100%.   Well-developed and well-nourished African American female. Head and neck exam shows no ocular or oral lesions. There are no palpable cervical or supraclavicular lymph nodes. Lungs are clear. Cardiac exam regular rate and rhythm with no murmurs rubs or bruits. Abdomen is soft. She is good bowel sounds. There is no fluid wave. There is no palpable liver or spleen tip. Back exam shows no tenderness over the spine ribs or hips. Extremities shows no clubbing cyanosis or edema. Skin exam shows no erythema on the palms of her hand. She no ecchymoses or petechia. Neurological exam is nonfocal.  Lab Results  Component Value Date   WBC 7.4 04/17/2016   HGB 9.2 (L) 04/17/2016   HCT 28.6 (L) 04/17/2016   MCV 80 (L) 04/17/2016  PLT 596 (H) 04/17/2016     Chemistry      Component Value Date/Time   NA 141 03/11/2016 1525   NA 139 01/29/2016 1037   K 4.1 03/11/2016 1525   K 3.3 (L) 01/29/2016 1037   CL 99 03/11/2016 1525   CO2 28 03/11/2016 1525   CO2 26 01/29/2016 1037   BUN 25 (H) 03/11/2016 1525   BUN 24.2 01/29/2016 1037   CREATININE 1.2 03/11/2016 1525   CREATININE 1.1 01/29/2016 1037      Component Value Date/Time   CALCIUM 9.1 03/11/2016 1525   CALCIUM 9.3 01/29/2016 1037   ALKPHOS 99 (H) 03/11/2016 1525   ALKPHOS 112 01/29/2016 1037   AST 22 03/11/2016 1525   AST 16 01/29/2016 1037   ALT 20  03/11/2016 1525   ALT 14 01/29/2016 1037   BILITOT 0.80 03/11/2016 1525   BILITOT 0.57 01/29/2016 1037         Impression and Plan: Ms. Condrey is 66 year old [name]. She has essential thrombocythemia. She actually is Calreticulin positive.  I'm glad that her platelet count has stabilized a low bit. It is still on the higher side. Hopefully, it will trend downward.  I will not change the anagrelide dose.  I don't think that she needs a bone marrow test right now.  I will go ahead and plan to get her back in 6 more weeks. If we continue to see a nice downward trend in her platelets, then we can try to move her appointments further apart.     Marland Kitchen [name], MD 2/16/20189:48 AM

## 2016-04-18 LAB — RETICULOCYTES: RETICULOCYTE COUNT: 1.1 % (ref 0.6–2.6)

## 2016-04-20 ENCOUNTER — Other Ambulatory Visit: Payer: Self-pay | Admitting: Internal Medicine

## 2016-04-21 NOTE — Telephone Encounter (Signed)
Pt need new Rx for lisinopril  Pharm: [facility]

## 2016-04-22 ENCOUNTER — Other Ambulatory Visit: Payer: Self-pay | Admitting: Family Medicine

## 2016-04-22 DIAGNOSIS — Z0000 Encounter for general adult medical examination without abnormal findings: Secondary | ICD-10-CM

## 2016-04-22 DIAGNOSIS — E119 Type 2 diabetes mellitus without complications: Secondary | ICD-10-CM

## 2016-04-22 DIAGNOSIS — E876 Hypokalemia: Secondary | ICD-10-CM

## 2016-04-22 DIAGNOSIS — D473 Essential (hemorrhagic) thrombocythemia: Secondary | ICD-10-CM

## 2016-04-22 DIAGNOSIS — D501 Sideropenic dysphagia: Secondary | ICD-10-CM

## 2016-04-22 DIAGNOSIS — I10 Essential (primary) hypertension: Secondary | ICD-10-CM

## 2016-04-22 NOTE — Telephone Encounter (Signed)
Sent to the pharmacy by e-scribe.  Pt is scheduled for 05/19/16.

## 2016-04-23 ENCOUNTER — Other Ambulatory Visit: Payer: Self-pay | Admitting: Internal Medicine

## 2016-04-27 NOTE — Telephone Encounter (Signed)
Sent to the pharmacy by e-scribe for 90 days.  Pt has upcoming yearly on 05/19/16.

## 2016-04-28 ENCOUNTER — Other Ambulatory Visit: Payer: Self-pay | Admitting: Hematology & Oncology

## 2016-04-28 DIAGNOSIS — D509 Iron deficiency anemia, unspecified: Secondary | ICD-10-CM

## 2016-05-12 ENCOUNTER — Other Ambulatory Visit: Payer: No Typology Code available for payment source

## 2016-05-14 ENCOUNTER — Other Ambulatory Visit (INDEPENDENT_AMBULATORY_CARE_PROVIDER_SITE_OTHER): Payer: Medicare Other

## 2016-05-14 DIAGNOSIS — I10 Essential (primary) hypertension: Secondary | ICD-10-CM | POA: Diagnosis not present

## 2016-05-14 DIAGNOSIS — Z0000 Encounter for general adult medical examination without abnormal findings: Secondary | ICD-10-CM

## 2016-05-14 DIAGNOSIS — E876 Hypokalemia: Secondary | ICD-10-CM | POA: Diagnosis not present

## 2016-05-14 LAB — MICROALBUMIN / CREATININE URINE RATIO
CREATININE, U: 142.6 mg/dL
MICROALB UR: 1.8 mg/dL (ref 0.0–1.9)
MICROALB/CREAT RATIO: 1.3 mg/g (ref 0.0–30.0)

## 2016-05-14 LAB — CBC WITH DIFFERENTIAL/PLATELET
BASOS PCT: 0.7 % (ref 0.0–3.0)
Basophils Absolute: 0.1 10*3/uL (ref 0.0–0.1)
EOS PCT: 2.9 % (ref 0.0–5.0)
Eosinophils Absolute: 0.2 10*3/uL (ref 0.0–0.7)
HEMATOCRIT: 27.4 % — AB (ref 36.0–46.0)
Hemoglobin: 9 g/dL — ABNORMAL LOW (ref 12.0–15.0)
LYMPHS PCT: 27.1 % (ref 12.0–46.0)
Lymphs Abs: 1.9 10*3/uL (ref 0.7–4.0)
MCHC: 32.6 g/dL (ref 30.0–36.0)
MCV: 77.7 fl — AB (ref 78.0–100.0)
MONOS PCT: 11.5 % (ref 3.0–12.0)
Monocytes Absolute: 0.8 10*3/uL (ref 0.1–1.0)
NEUTROS PCT: 57.8 % (ref 43.0–77.0)
Neutro Abs: 4 10*3/uL (ref 1.4–7.7)
PLATELETS: 503 10*3/uL — AB (ref 150.0–400.0)
RBC: 3.53 Mil/uL — ABNORMAL LOW (ref 3.87–5.11)
RDW: 19.7 % — AB (ref 11.5–15.5)
WBC: 6.9 10*3/uL (ref 4.0–10.5)

## 2016-05-14 LAB — BASIC METABOLIC PANEL
BUN: 26 mg/dL — ABNORMAL HIGH (ref 6–23)
CHLORIDE: 106 meq/L (ref 96–112)
CO2: 30 meq/L (ref 19–32)
Calcium: 9.4 mg/dL (ref 8.4–10.5)
Creatinine, Ser: 0.85 mg/dL (ref 0.40–1.20)
GFR: 86.05 mL/min (ref >60.00)
GLUCOSE: 97 mg/dL (ref 70–99)
Potassium: 4.2 mEq/L (ref 3.5–5.1)
SODIUM: 143 meq/L (ref 135–145)

## 2016-05-14 LAB — LIPID PANEL
CHOL/HDL RATIO: 4
Cholesterol: 236 mg/dL — ABNORMAL HIGH (ref 0–200)
HDL: 59.7 mg/dL (ref >39.00)
LDL Cholesterol: 164 mg/dL — ABNORMAL HIGH (ref 0–99)
NONHDL: 176.66
Triglycerides: 61 mg/dL (ref 0.0–149.0)
VLDL: 12.2 mg/dL (ref 0.0–40.0)

## 2016-05-14 LAB — HEPATIC FUNCTION PANEL
ALT: 13 U/L (ref 0–35)
AST: 14 U/L (ref 0–37)
Albumin: 4 g/dL (ref 3.5–5.2)
Alkaline Phosphatase: 106 U/L (ref 39–117)
BILIRUBIN DIRECT: 0.1 mg/dL (ref 0.0–0.3)
TOTAL PROTEIN: 7 g/dL (ref 6.0–8.3)
Total Bilirubin: 0.5 mg/dL (ref 0.2–1.2)

## 2016-05-14 LAB — HEMOGLOBIN A1C: Hgb A1c MFr Bld: 6.5 % (ref 4.6–6.5)

## 2016-05-14 LAB — TSH: TSH: 1.62 u[IU]/mL (ref 0.35–4.50)

## 2016-05-18 NOTE — Progress Notes (Deleted)
No chief complaint on file.   HPI: Patient  Alexandra [name]  66 y.o. comes in today for Preventive Health Care visit   Health Maintenance  Topic Date Due  . Hepatitis C Screening  [birth year]  . OPHTHALMOLOGY EXAM  12/26/2013  . DEXA SCAN  05/08/2015  . PNA vac Low Risk Adult (1 of 2 - PCV13) 05/08/2015  . FOOT EXAM  04/30/2016  . INFLUENZA VACCINE  01/28/2017 (Originally 10/01/2015)  . HEMOGLOBIN A1C  11/14/2016  . MAMMOGRAM  04/15/2017  . COLONOSCOPY  05/02/2019  . TETANUS/TDAP  02/26/2021   Health Maintenance Review LIFESTYLE:  Exercise:   Tobacco/ETS: Alcohol:  Sugar beverages: Sleep: Drug use: no HH of  Work:    ROS:  GEN/ HEENT: No fever, significant weight changes sweats headaches vision problems hearing changes, CV/ PULM; No chest pain shortness of breath cough, syncope,edema  change in exercise tolerance. GI /GU: No adominal pain, vomiting, change in bowel habits. No blood in the stool. No significant GU symptoms. SKIN/HEME: ,no acute skin rashes suspicious lesions or bleeding. No lymphadenopathy, nodules, masses.  NEURO/ PSYCH:  No neurologic signs such as weakness numbness. No depression anxiety. IMM/ Allergy: No unusual infections.  Allergy .   REST of 12 system review negative except as per HPI   Past Medical History:  Diagnosis Date  . Diabetes mellitus without complication ([place])   . Hypertension    echo   . Iron deficiency anemia, unspecified 03/15/2013  . Thrombocytosis ([place])     Past Surgical History:  Procedure Laterality Date  . BONE MARROW BIOPSY    . ECTOPIC PREGNANCY SURGERY  1984    Family History  Problem Relation Age of Onset  . Heart disease Mother     died age 66  . Kidney disease Mother     family hx  . Heart attack Father 38    massive  . Hypertension Sister   . Hypertension Sister   . Hypertension Sister   . Hypertension Sister   . Arthritis      family hx  . Diabetes      family hx  . Stroke       dialysis     Social History   Social History  . Marital status: Married    Spouse name: N/A  . Number of children: N/A  . Years of education: N/A   Social History Main Topics  . Smoking status: Never Smoker  . Smokeless tobacco: Never Used     Comment: never used tobacco  . Alcohol use No  . Drug use: No  . Sexual activity: Not on file   Other Topics Concern  . 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   No tobacco no ethoh little caffiene .     Outpatient Medications Prior to Visit  Medication Sig Dispense Refill  . amLODipine (NORVASC) 5 MG tablet TAKE ONE TABLET BY MOUTH ONCE DAILY 30 tablet 5  . anagrelide (AGRYLIN) 1 MG capsule Take 5 capsules (5 mg total) by mouth daily. 150 capsule 4  . aspirin 81 MG tablet Take 81 mg by mouth daily.    Marland Kitchen BYSTOLIC 20 MG TABS TAKE ONE TABLET BY MOUTH ONCE DAILY 90 tablet 0  . Fe Fum-FePoly-Vit C-Vit B3 (INTEGRA) 62.5-62.5-40-3 MG CAPS TAKE 1 CAPSULE BY MOUTH EVERY DAY  30 capsule 0  . glucose blood (ONE TOUCH ULTRA TEST) test strip Use to test blood sugar 1 times daily. **PT NEEDS FOLLOW UP APPT FOR FURTHER REFILLS** 100 each 0  . lisinopril (PRINIVIL,ZESTRIL) 20 MG tablet TAKE TWO TABLETS BY MOUTH ONCE DAILY 180 tablet 0  . ONETOUCH DELICA LANCETS 03U MISC Use to test blood sugar 1 time daily. **PT NEEDS FOLLOW UP APPT FOR FURTHER REFILLS** 100 each 1  . Vitamin D, Ergocalciferol, (DRISDOL) 50000 units CAPS capsule Take 1 capsule (50,000 Units total) by mouth once a week. 12 capsule 3   No facility-administered medications prior to visit.      EXAM:  There were no vitals taken for this visit.  There is no height or weight on file to calculate BMI. Wt Readings from Last 3 Encounters:  04/17/16 153 lb 12.8 oz (69.8 kg)  03/11/16 151 lb 12.8 oz (68.9 kg)  01/29/16 150 lb (68 kg)    Physical Exam: Vital signs reviewed UEK:CMKL is a well-developed  well-nourished alert cooperative    who appearsr stated age in no acute distress.  HEENT: normocephalic atraumatic , Eyes: PERRL EOM's full, conjunctiva clear, Nares: paten,t no deformity discharge or tenderness., Ears: no deformity EAC's clear TMs with normal landmarks. Mouth: clear OP, no lesions, edema.  Moist mucous membranes. Dentition in adequate repair. NECK: supple without masses, thyromegaly or bruits. CHEST/PULM:  Clear to auscultation and percussion breath sounds equal no wheeze , rales or rhonchi. No chest wall deformities or tenderness. Breast: normal by inspection . No dimpling, discharge, masses, tenderness or discharge . CV: PMI is nondisplaced, S1 S2 no gallops, murmurs, rubs. Peripheral pulses are full without delay.No JVD .  ABDOMEN: Bowel sounds normal nontender  No guard or rebound, no hepato splenomegal no CVA tenderness.  No hernia. Extremtities:  No clubbing cyanosis or edema, no acute joint swelling or redness no focal atrophy NEURO:  Oriented x3, cranial nerves 3-12 appear to be intact, no obvious focal weakness,gait within normal limits no abnormal reflexes or asymmetrical SKIN: No acute rashes normal turgor, color, no bruising or petechiae. PSYCH: Oriented, good eye contact, no obvious depression anxiety, cognition and judgment appear normal. LN: no cervical axillary inguinal adenopathy  Lab Results  Component Value Date   WBC 6.9 05/14/2016   HGB 9.0 (L) 05/14/2016   HCT 27.4 (L) 05/14/2016   PLT 503.0 (H) 05/14/2016   GLUCOSE 97 05/14/2016   CHOL 236 (H) 05/14/2016   TRIG 61.0 05/14/2016   HDL 59.70 05/14/2016   LDLDIRECT 127.3 04/19/2012   LDLCALC 164 (H) 05/14/2016   ALT 13 05/14/2016   AST 14 05/14/2016   NA 143 05/14/2016   K 4.2 05/14/2016   CL 106 05/14/2016   CREATININE 0.85 05/14/2016   BUN 26 (H) 05/14/2016   CO2 30 05/14/2016   TSH 1.62 05/14/2016   HGBA1C 6.5 05/14/2016   MICROALBUR 1.8 05/14/2016    BP Readings from Last 3 Encounters:   04/17/16 (!) 156/89  03/11/16 (!) 155/82  01/29/16 (!) 155/87    Lab results reviewed with patient   ASSESSMENT AND PLAN:  Discussed the following assessment and plan:  Visit for preventive health examination  Essential hypertension  Controlled type 2 diabetes mellitus without complication, without long-term current use of insulin ([place])  Essential thrombocythemia [facility])  Patient Care Team: Burnis Medin, MD as PCP - General [name], MD as Attending Physician (Hematology and Oncology) Philemon Kingdom, MD as Consulting Physician (Internal Medicine) There are  no Patient Instructions on file for this visit.  Standley Brooking. Panosh M.D.

## 2016-05-19 ENCOUNTER — Telehealth: Payer: Self-pay

## 2016-05-19 ENCOUNTER — Encounter: Payer: Medicare Other | Admitting: Internal Medicine

## 2016-05-19 ENCOUNTER — Ambulatory Visit (INDEPENDENT_AMBULATORY_CARE_PROVIDER_SITE_OTHER): Payer: Medicare Other

## 2016-05-19 VITALS — BP 150/76 | HR 78 | Ht 71.0 in | Wt 153.0 lb

## 2016-05-19 DIAGNOSIS — Z0000 Encounter for general adult medical examination without abnormal findings: Secondary | ICD-10-CM

## 2016-05-19 NOTE — Patient Instructions (Addendum)
Alexandra [name] , Thank you for taking time to come for your Medicare Wellness Visit. I appreciate your ongoing commitment to your health goals. Please review the following plan we discussed and let me know if I can assist you in the future.   Will try to schedule an eye apt this year  Will have dexa with mammogram; Dexa ordered at the breast center  Will try to complete AD; Given copy  Referred to [facility] for questions [facility] offers free advance directive forms, as well as assistance in completing the forms themselves. For assistance, contact the Spiritual Care Department at [phone], or the Clinical Social Work Department at [phone].  You can go to the [facility].gov to learn more about the pneumonia vaccinations  Medicare now request all "baby boomers" test for possible exposure to Hepatitis C. Many may have been exposed due to dental work, tatoo's, vaccinations when young. The Hepatitis C virus is dormant for many years and then sometimes will cause liver cancer. If you gave blood in the past 15 years, you were most likely checked for Hep C. If you rec'd blood; you may want to consider testing or if you are high risk for any other reason.   Hepatitis c declined      These are the goals we discussed: Goals    . Exercise 150 minutes per week (moderate activity)          Try to walk 5 days a week x 30 minutes        This is a list of the screening recommended for you and due dates:  Health Maintenance  Topic Date Due  .  Hepatitis C: One time screening is recommended by Center for Disease Control  (CDC) for  adults born from [birth year] through 1965.   1950/03/15  . Eye exam for diabetics  12/26/2013  . DEXA scan (bone density measurement)  05/08/2015  . Pneumonia vaccines (1 of 2 - PCV13) 05/08/2015  . Complete foot exam   04/30/2016  . Flu Shot  01/28/2017*  . Hemoglobin A1C  11/14/2016  . Mammogram  04/15/2017  . Colon Cancer Screening  05/02/2019  . Tetanus Vaccine   02/26/2021  *Topic was postponed. The date shown is not the original due date.        Fall Prevention in the Home Falls can cause injuries. They can happen to people of all ages. There are many things you can do to make your home safe and to help prevent falls. What can I do on the outside of my home?  Regularly fix the edges of walkways and driveways and fix any cracks.  Remove anything that might make you trip as you walk through a door, such as a raised step or threshold.  Trim any bushes or trees on the path to your home.  Use bright outdoor lighting.  Clear any walking paths of anything that might make someone trip, such as rocks or tools.  Regularly check to see if handrails are loose or broken. Make sure that both sides of any steps have handrails.  Any raised decks and porches should have guardrails on the edges.  Have any leaves, snow, or ice cleared regularly.  Use sand or salt on walking paths during winter.  Clean up any spills in your garage right away. This includes oil or grease spills. What can I do in the bathroom?  Use night lights.  Install grab bars by the toilet and in the tub and shower. Do  not use towel bars as grab bars.  Use non-skid mats or decals in the tub or shower.  If you need to sit down in the shower, use a plastic, non-slip stool.  Keep the floor dry. Clean up any water that spills on the floor as soon as it happens.  Remove soap buildup in the tub or shower regularly.  Attach bath mats securely with double-sided non-slip rug tape.  Do not have throw rugs and other things on the floor that can make you trip. What can I do in the bedroom?  Use night lights.  Make sure that you have a light by your bed that is easy to reach.  Do not use any sheets or blankets that are too big for your bed. They should not hang down onto the floor.  Have a firm chair that has side arms. You can use this for support while you get dressed.  Do not  have throw rugs and other things on the floor that can make you trip. What can I do in the kitchen?  Clean up any spills right away.  Avoid walking on wet floors.  Keep items that you use a lot in easy-to-reach places.  If you need to reach something above you, use a strong step stool that has a grab bar.  Keep electrical cords out of the way.  Do not use floor polish or wax that makes floors slippery. If you must use wax, use non-skid floor wax.  Do not have throw rugs and other things on the floor that can make you trip. What can I do with my stairs?  Do not leave any items on the stairs.  Make sure that there are handrails on both sides of the stairs and use them. Fix handrails that are broken or loose. Make sure that handrails are as long as the stairways.  Check any carpeting to make sure that it is firmly attached to the stairs. Fix any carpet that is loose or worn.  Avoid having throw rugs at the top or bottom of the stairs. If you do have throw rugs, attach them to the floor with carpet tape.  Make sure that you have a light switch at the top of the stairs and the bottom of the stairs. If you do not have them, ask someone to add them for you. What else can I do to help prevent falls?  Wear shoes that:  Do not have high heels.  Have rubber bottoms.  Are comfortable and fit you well.  Are closed at the toe. Do not wear sandals.  If you use a stepladder:  Make sure that it is fully opened. Do not climb a closed stepladder.  Make sure that both sides of the stepladder are locked into place.  Ask someone to hold it for you, if possible.  Clearly mark and make sure that you can see:  Any grab bars or handrails.  First and last steps.  Where the edge of each step is.  Use tools that help you move around (mobility aids) if they are needed. These include:  Canes.  Walkers.  Scooters.  Crutches.  Turn on the lights when you go into a dark area. Replace  any light bulbs as soon as they burn out.  Set up your furniture so you have a clear path. Avoid moving your furniture around.  If any of your floors are uneven, fix them.  If there are any pets around you, be aware  of where they are.  Review your medicines with your doctor. Some medicines can make you feel dizzy. This can increase your chance of falling. Ask your doctor what other things that you can do to help prevent falls. This information is not intended to replace advice given to you by your health care provider. Make sure you discuss any questions you have with your health care provider. Document Released: 12/13/2008 Document Revised: 07/25/2015 Document Reviewed: 03/23/2014 Elsevier Interactive Patient Education  2017 [place] Maintenance, Female Adopting a healthy lifestyle and getting preventive care can go a long way to promote health and wellness. Talk with your health care provider about what schedule of regular examinations is right for you. This is a good chance for you to check in with your provider about disease prevention and staying healthy. In between checkups, there are plenty of things you can do on your own. Experts have done a lot of research about which lifestyle changes and preventive measures are most likely to keep you healthy. Ask your health care provider for more information. Weight and diet Eat a healthy diet  Be sure to include plenty of vegetables, fruits, low-fat dairy products, and lean protein.  Do not eat a lot of foods high in solid fats, added sugars, or salt.  Get regular exercise. This is one of the most important things you can do for your health.  Most adults should exercise for at least 150 minutes each week. The exercise should increase your heart rate and make you sweat (moderate-intensity exercise).  Most adults should also do strengthening exercises at least twice a week. This is in addition to the moderate-intensity  exercise. Maintain a healthy weight  Body mass index (BMI) is a measurement that can be used to identify possible weight problems. It estimates body fat based on height and weight. Your health care provider can help determine your BMI and help you achieve or maintain a healthy weight.  For females 54 years of age and older:  A BMI below 18.5 is considered underweight.  A BMI of 18.5 to 24.9 is normal.  A BMI of 25 to 29.9 is considered overweight.  A BMI of 30 and above is considered obese. Watch levels of cholesterol and blood lipids  You should start having your blood tested for lipids and cholesterol at 66 years of age, then have this test every 5 years.  You may need to have your cholesterol levels checked more often if:  Your lipid or cholesterol levels are high.  You are older than 66 years of age.  You are at high risk for heart disease. Cancer screening Lung Cancer  Lung cancer screening is recommended for adults 34-20 years old who are at high risk for lung cancer because of a history of smoking.  A yearly low-dose CT scan of the lungs is recommended for people who:  Currently smoke.  Have quit within the past 15 years.  Have at least a 30-pack-year history of smoking. A pack year is smoking an average of one pack of cigarettes a day for 1 year.  Yearly screening should continue until it has been 15 years since you quit.  Yearly screening should stop if you develop a health problem that would prevent you from having lung cancer treatment. Breast Cancer  Practice breast self-awareness. This means understanding how your breasts normally appear and feel.  It also means doing regular breast self-exams. Let your health care provider know about any changes, no  matter how small.  If you are in your 20s or 30s, you should have a clinical breast exam (CBE) by a health care provider every 1-3 years as part of a regular health exam.  If you are 11 or older, have a CBE  every year. Also consider having a breast X-ray (mammogram) every year.  If you have a family history of breast cancer, talk to your health care provider about genetic screening.  If you are at high risk for breast cancer, talk to your health care provider about having an MRI and a mammogram every year.  Breast cancer gene (BRCA) assessment is recommended for women who have family members with BRCA-related cancers. BRCA-related cancers include:  Breast.  Ovarian.  Tubal.  Peritoneal cancers.  Results of the assessment will determine the need for genetic counseling and BRCA1 and BRCA2 testing. Cervical Cancer  Your health care provider may recommend that you be screened regularly for cancer of the pelvic organs (ovaries, uterus, and vagina). This screening involves a pelvic examination, including checking for microscopic changes to the surface of your cervix (Pap test). You may be encouraged to have this screening done every 3 years, beginning at age 110.  For women ages 9-65, health care providers may recommend pelvic exams and Pap testing every 3 years, or they may recommend the Pap and pelvic exam, combined with testing for human papilloma virus (HPV), every 5 years. Some types of HPV increase your risk of cervical cancer. Testing for HPV may also be done on women of any age with unclear Pap test results.  Other health care providers may not recommend any screening for nonpregnant women who are considered low risk for pelvic cancer and who do not have symptoms. Ask your health care provider if a screening pelvic exam is right for you.  If you have had past treatment for cervical cancer or a condition that could lead to cancer, you need Pap tests and screening for cancer for at least 20 years after your treatment. If Pap tests have been discontinued, your risk factors (such as having a new sexual partner) need to be reassessed to determine if screening should resume. Some women have medical  problems that increase the chance of getting cervical cancer. In these cases, your health care provider may recommend more frequent screening and Pap tests. Colorectal Cancer  This type of cancer can be detected and often prevented.  Routine colorectal cancer screening usually begins at 66 years of age and continues through 66 years of age.  Your health care provider may recommend screening at an earlier age if you have risk factors for colon cancer.  Your health care provider may also recommend using home test kits to check for hidden blood in the stool.  A small camera at the end of a tube can be used to examine your colon directly (sigmoidoscopy or colonoscopy). This is done to check for the earliest forms of colorectal cancer.  Routine screening usually begins at age 71.  Direct examination of the colon should be repeated every 5-10 years through 66 years of age. However, you may need to be screened more often if early forms of precancerous polyps or small growths are found. Skin Cancer  Check your skin from head to toe regularly.  Tell your health care provider about any new moles or changes in moles, especially if there is a change in a mole's shape or color.  Also tell your health care provider if you have a mole  that is larger than the size of a pencil eraser.  Always use sunscreen. Apply sunscreen liberally and repeatedly throughout the day.  Protect yourself by wearing long sleeves, pants, a wide-brimmed hat, and sunglasses whenever you are outside. Heart disease, diabetes, and high blood pressure  High blood pressure causes heart disease and increases the risk of stroke. High blood pressure is more likely to develop in:  People who have blood pressure in the high end of the normal range (130-139/85-89 mm Hg).  People who are overweight or obese.  People who are African American.  If you are 85-41 years of age, have your blood pressure checked every 3-5 years. If you  are 96 years of age or older, have your blood pressure checked every year. You should have your blood pressure measured twice-once when you are at a hospital or clinic, and once when you are not at a hospital or clinic. Record the average of the two measurements. To check your blood pressure when you are not at a hospital or clinic, you can use:  An automated blood pressure machine at a pharmacy.  A home blood pressure monitor.  If you are between 57 years and 32 years old, ask your health care provider if you should take aspirin to prevent strokes.  Have regular diabetes screenings. This involves taking a blood sample to check your fasting blood sugar level.  If you are at a normal weight and have a low risk for diabetes, have this test once every three years after 66 years of age.  If you are overweight and have a high risk for diabetes, consider being tested at a younger age or more often. Preventing infection Hepatitis B  If you have a higher risk for hepatitis B, you should be screened for this virus. You are considered at high risk for hepatitis B if:  You were born in a country where hepatitis B is common. Ask your health care provider which countries are considered high risk.  Your parents were born in a high-risk country, and you have not been immunized against hepatitis B (hepatitis B vaccine).  You have HIV or AIDS.  You use needles to inject street drugs.  You live with someone who has hepatitis B.  You have had sex with someone who has hepatitis B.  You get hemodialysis treatment.  You take certain medicines for conditions, including cancer, organ transplantation, and autoimmune conditions. Hepatitis C  Blood testing is recommended for:  Everyone born from [birth year] through 1965.  Anyone with known risk factors for hepatitis C. Sexually transmitted infections (STIs)  You should be screened for sexually transmitted infections (STIs) including gonorrhea and chlamydia  if:  You are sexually active and are younger than 66 years of age.  You are older than 66 years of age and your health care provider tells you that you are at risk for this type of infection.  Your sexual activity has changed since you were last screened and you are at an increased risk for chlamydia or gonorrhea. Ask your health care provider if you are at risk.  If you do not have HIV, but are at risk, it may be recommended that you take a prescription medicine daily to prevent HIV infection. This is called pre-exposure prophylaxis (PrEP). You are considered at risk if:  You are sexually active and do not regularly use condoms or know the HIV status of your partner(s).  You take drugs by injection.  You are sexually active with  a partner who has HIV. Talk with your health care provider about whether you are at high risk of being infected with HIV. If you choose to begin PrEP, you should first be tested for HIV. You should then be tested every 3 months for as long as you are taking PrEP. Pregnancy  If you are premenopausal and you may become pregnant, ask your health care provider about preconception counseling.  If you may become pregnant, take 400 to 800 micrograms (mcg) of folic acid every day.  If you want to prevent pregnancy, talk to your health care provider about birth control (contraception). Osteoporosis and menopause  Osteoporosis is a disease in which the bones lose minerals and strength with aging. This can result in serious bone fractures. Your risk for osteoporosis can be identified using a bone density scan.  If you are 81 years of age or older, or if you are at risk for osteoporosis and fractures, ask your health care provider if you should be screened.  Ask your health care provider whether you should take a calcium or vitamin D supplement to lower your risk for osteoporosis.  Menopause may have certain physical symptoms and risks.  Hormone replacement therapy may  reduce some of these symptoms and risks. Talk to your health care provider about whether hormone replacement therapy is right for you. Follow these instructions at home:  Schedule regular health, dental, and eye exams.  Stay current with your immunizations.  Do not use any tobacco products including cigarettes, chewing tobacco, or electronic cigarettes.  If you are pregnant, do not drink alcohol.  If you are breastfeeding, limit how much and how often you drink alcohol.  Limit alcohol intake to no more than 1 drink per day for nonpregnant women. One drink equals 12 ounces of beer, 5 ounces of wine, or 1 ounces of hard liquor.  Do not use street drugs.  Do not share needles.  Ask your health care provider for help if you need support or information about quitting drugs.  Tell your health care provider if you often feel depressed.  Tell your health care provider if you have ever been abused or do not feel safe at home. This information is not intended to replace advice given to you by your health care provider. Make sure you discuss any questions you have with your health care provider. Document Released: 09/01/2010 Document Revised: 07/25/2015 Document Reviewed: 11/20/2014 Elsevier Interactive Patient Education  2017 [employer].

## 2016-05-19 NOTE — Telephone Encounter (Signed)
Seen Alexandra [name] this am Agrees to have Dexa but order in for Alexandra [name] Is in process of scheduling Mammogram at the Breast center and would like to have her dexa there at the same time of her mammogram   Please advise.  Tks
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
## 2016-05-19 NOTE — Telephone Encounter (Signed)
[place] with me  But dont know how to put the order in    Can you do this for her?   WP

## 2016-05-20 ENCOUNTER — Other Ambulatory Visit: Payer: Self-pay

## 2016-05-20 DIAGNOSIS — E2839 Other primary ovarian failure: Secondary | ICD-10-CM

## 2016-05-20 NOTE — Telephone Encounter (Signed)
Changed dexa order to direct to the Breast center Called to let the patient know the order was placed and to call if she does not receive a call, Left my direct line

## 2016-05-20 NOTE — Progress Notes (Signed)
Chief Complaint  Patient presents with  . Annual Exam    HPI: Alexandra [name] 66 y.o. comes in today for Preventive Medicare check up .Since last visit. See dr E for her thombocytthemia  Bp  In 140 150 at home and no se  meds  BG diet controlled and doing well  Retired   Hydroureteronephrosis  Liver cyst  Thickened endometrium     Health Maintenance  Topic Date Due  . OPHTHALMOLOGY EXAM  12/26/2013  . DEXA SCAN  05/08/2015  . FOOT EXAM  04/30/2016  . INFLUENZA VACCINE  01/28/2017 (Originally 10/01/2015)  . Hepatitis C Screening  05/17/2017 (Originally [birth year])  . PNA vac Low Risk Adult (1 of 2 - PCV13) 05/17/2017 (Originally 05/08/2015)  . HEMOGLOBIN A1C  11/14/2016  . MAMMOGRAM  04/15/2017  . COLONOSCOPY  05/02/2019  . TETANUS/TDAP  02/26/2021   Health Maintenance Review LIFESTYLE:  TADn Sugar beverages:n Sleep:ok Hh of 2  Exercises  Feels well      Hearing: ok  Vision:  No limitations at present . Last eye check UTD  Safety:  Has smoke detector and wears seat belts.  No firearms. No excess sun exposure. Sees dentist regularly.  Falls: n  Advance directive :  Reviewed  Has one.  Memory: Felt to be good  , no concern from her or her family.  Depression: No anhedonia unusual crying or depressive symptoms  Nutrition: Eats well balanced diet; adequate calcium and vitamin D. No swallowing chewing problems.  Injury: no major injuries in the last six months.  Other healthcare providers:  Reviewed today .  Social:  Lives with spouse married. No pets.   Preventive parameters: up-to-date  Reviewed   ADLS:   There are no problems or need for assistance  driving, feeding, obtaining food, dressing, toileting and bathing, managing money using phone. She is independent.    ROS:  GEN/ HEENT: No fever, significant weight changes sweats headaches vision problems hearing changes, CV/ PULM; No chest pain shortness of breath cough, syncope,edema  change in  exercise tolerance. GI /GU: No adominal pain, vomiting, change in bowel habits. No blood in the stool. No significant GU symptoms. SKIN/HEME: ,no acute skin rashes suspicious lesions or bleeding. No lymphadenopathy, nodules, masses.  NEURO/ PSYCH:  No neurologic signs such as weakness numbness. No depression anxiety. IMM/ Allergy: No unusual infections.  Allergy .   REST of 12 system review negative except as per HPI   Past Medical History:  Diagnosis Date  . Diabetes mellitus without complication ([place])   . Hypertension    echo   . Iron deficiency anemia, unspecified 03/15/2013  . Thrombocytosis ([facility])     Family History  Problem Relation Age of Onset  . Heart disease Mother     died age 97  . Kidney disease Mother     family hx  . Heart attack Father 78    massive  . Hypertension Sister   . Hypertension Sister   . Hypertension Sister   . Hypertension Sister   . Arthritis      family hx  . Diabetes      family hx  . Stroke       dialysis    Social History   Social History  . Marital status: Married    Spouse name: N/A  . Number of children: N/A  . Years of education: N/A   Social History Main Topics  . Smoking status: Never Smoker  . Smokeless tobacco: Never  Used     Comment: never used tobacco  . Alcohol use No  . Drug use: No  . Sexual activity: Not Asked   Other Topics Concern  . 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   No tobacco no ethoh little caffiene .     Outpatient Encounter Prescriptions as of 05/21/2016  Medication Sig  . amLODipine (NORVASC) 5 MG tablet TAKE ONE TABLET BY MOUTH ONCE DAILY  . anagrelide (AGRYLIN) 1 MG capsule Take 5 capsules (5 mg total) by mouth daily.  Marland Kitchen aspirin 81 MG tablet Take 81 mg by mouth daily.  Marland Kitchen BYSTOLIC 20 MG TABS TAKE ONE TABLET BY MOUTH ONCE DAILY  . Fe Fum-FePoly-Vit C-Vit B3 (INTEGRA) 62.5-62.5-40-3 MG CAPS TAKE 1 CAPSULE BY MOUTH EVERY  DAY  . glucose blood (ONE TOUCH ULTRA TEST) test strip Use to test blood sugar 1 times daily. **PT NEEDS FOLLOW UP APPT FOR FURTHER REFILLS**  . lisinopril (PRINIVIL,ZESTRIL) 20 MG tablet TAKE TWO TABLETS BY MOUTH ONCE DAILY  . ONETOUCH DELICA LANCETS 22Q MISC Use to test blood sugar 1 time daily. **PT NEEDS FOLLOW UP APPT FOR FURTHER REFILLS**  . Vitamin D, Ergocalciferol, (DRISDOL) 50000 units CAPS capsule Take 1 capsule (50,000 Units total) by mouth once a week.  Marland Kitchen amLODipine (NORVASC) 2.5 MG tablet Take 1 tablet (2.5 mg total) by mouth daily. Take with 5 mg total 7.5 mg  . atorvastatin (LIPITOR) 10 MG tablet Take 1 tablet (10 mg total) by mouth daily.   No facility-administered encounter medications on file as of 05/21/2016.     EXAM:  BP (!) 146/80 (BP Location: Right Arm, Patient Position: Sitting, Cuff Size: Large)   Pulse 85   Temp 97.6 F (36.4 C) (Oral)   Ht 5\' 11"  (1.803 m)   Wt 153 lb (69.4 kg)   BMI 21.34 kg/m   Body mass index is 21.34 kg/m. Repeat bp right arm 146/80 large  Right  Physical Exam: Vital signs reviewed JFH:LKTG is a well-developed well-nourished alert cooperative   who appears stated age in no acute distress.  HEENT: normocephalic atraumatic , Eyes: PERRL EOM's full, conjunctiva clear, Nares: paten,t no deformity discharge or tenderness., Ears: no deformity EAC's clear TMs with normal landmarks. Mouth: clear OP, no lesions, edema.  Moist mucous membranes. Dentition in adequate repair. NECK: supple without masses, thyromegaly or bruits. CHEST/PULM:  Clear to auscultation and percussion breath sounds equal no wheeze , rales or rhonchi. No chest wall deformities or tenderness.Breast: normal by inspection . No dimpling, discharge, masses, tenderness or discharge . CV: PMI is nondisplaced, S1 S2 no gallops, murmurs, rubs. Peripheral pulses are full without delay.No JVD .  ABDOMEN: Bowel sounds normal nontender  No guard or rebound, no hepato splenomegal no CVA  tenderness.   Extremtities:  No clubbing cyanosis or edema, no acute joint swelling or redness no focal atrophy NEURO:  Oriented x3, cranial nerves 3-12 appear to be intact, no obvious focal weakness,gait within normal limits no abnormal reflexes or asymmetrical SKIN: No acute rashes normal turgor, color, no bruising or petechiae. PSYCH: Oriented, good eye contact, no obvious depression anxiety, cognition and judgment appear normal. LN: no cervical axillary inguinal adenopathy No noted deficits in memory, attention, and speech.   Lab Results  Component Value Date   WBC 6.9 05/14/2016   HGB 9.0 (L) 05/14/2016  HCT 27.4 (L) 05/14/2016   PLT 503.0 (H) 05/14/2016   GLUCOSE 97 05/14/2016   CHOL 236 (H) 05/14/2016   TRIG 61.0 05/14/2016   HDL 59.70 05/14/2016   LDLDIRECT 127.3 04/19/2012   LDLCALC 164 (H) 05/14/2016   ALT 13 05/14/2016   AST 14 05/14/2016   NA 143 05/14/2016   K 4.2 05/14/2016   CL 106 05/14/2016   CREATININE 0.85 05/14/2016   BUN 26 (H) 05/14/2016   CO2 30 05/14/2016   TSH 1.62 05/14/2016   HGBA1C 6.5 05/14/2016   MICROALBUR 1.8 05/14/2016   BP Readings from Last 3 Encounters:  05/21/16 (!) 146/80  05/19/16 (!) 150/76  04/17/16 (!) 156/89   Wt Readings from Last 3 Encounters:  05/21/16 153 lb (69.4 kg)  05/19/16 153 lb (69.4 kg)  04/17/16 153 lb 12.8 oz (69.8 kg)     ASSESSMENT AND PLAN:  Discussed the following assessment and plan:  Visit for preventive health examination  Essential hypertension  Controlled type 2 diabetes mellitus without complication, without long-term current use of insulin ([facility])  Essential thrombocythemia ([place])  Hyperlipidemia, unspecified hyperlipidemia type Normal weight dietary control Blood pressure has been problematic in the office. Has a white coat effect but had been better. Increase amlodipine   7.5  ( as opposed to 10 )  Need better control consider  Other eval and add back diuretic   Or  Pot sparing  Will review  record  .   Had  A stone  Hydronephrosis  nl adrenals   And  Disc lipid management with dx of dm even if controlled an and ht and age  She will begin atorva 10 and  Fu lipid panel before next visit  .  Of note there was atherosclerosis findings on her renal ct.  Consider ht clinic  Patient Care Team: Burnis Medin, MD as PCP - General [name], MD as Attending Physician (Hematology and Oncology) Philemon Kingdom, MD as Consulting Physician (Internal Medicine)  Patient Instructions  Let stay on the same medicine but increase the amlodipine to 7.5 mg a day. For better blood pressure control There are other options also.   After  On higher dose of med  check blood pressure readings twice a day for a week and record them.  Would like to come back in in  2-3 monthsto assess her blood pressure control and medications.  Glad you are doing well your cholesterol level is also very high and guidelines would suggest he would benefit from a cholesterol medication.     Standley Brooking. Evella Kasal M.D.

## 2016-05-21 ENCOUNTER — Encounter: Payer: Self-pay | Admitting: Internal Medicine

## 2016-05-21 ENCOUNTER — Ambulatory Visit (INDEPENDENT_AMBULATORY_CARE_PROVIDER_SITE_OTHER): Payer: Medicare Other | Admitting: Internal Medicine

## 2016-05-21 VITALS — BP 146/80 | HR 85 | Temp 97.6°F | Ht 71.0 in | Wt 153.0 lb

## 2016-05-21 DIAGNOSIS — E785 Hyperlipidemia, unspecified: Secondary | ICD-10-CM | POA: Diagnosis not present

## 2016-05-21 DIAGNOSIS — Z0000 Encounter for general adult medical examination without abnormal findings: Secondary | ICD-10-CM | POA: Diagnosis not present

## 2016-05-21 DIAGNOSIS — I10 Essential (primary) hypertension: Secondary | ICD-10-CM | POA: Diagnosis not present

## 2016-05-21 DIAGNOSIS — E119 Type 2 diabetes mellitus without complications: Secondary | ICD-10-CM

## 2016-05-21 DIAGNOSIS — D473 Essential (hemorrhagic) thrombocythemia: Secondary | ICD-10-CM | POA: Diagnosis not present

## 2016-05-21 MED ORDER — AMLODIPINE BESYLATE 2.5 MG PO TABS: 2.5000 mg | ORAL_TABLET | Freq: Every day | ORAL | 1 refills | Status: DC

## 2016-05-21 MED ORDER — ATORVASTATIN CALCIUM 10 MG PO TABS: 10.0000 mg | ORAL_TABLET | Freq: Every day | ORAL | 5 refills | Status: DC

## 2016-05-21 NOTE — Patient Instructions (Addendum)
Let stay on the same medicine but increase the amlodipine to 7.5 mg a day. For better blood pressure control There are other options also.   After  On higher dose of med  check blood pressure readings twice a day for a week and record them.  Would like to come back in in  2-3 monthsto assess her blood pressure control and medications.  Glad you are doing well your cholesterol level is also very high and guidelines would suggest he would benefit from a cholesterol medication.

## 2016-05-23 ENCOUNTER — Encounter: Payer: Self-pay | Admitting: Internal Medicine

## 2016-05-28 ENCOUNTER — Other Ambulatory Visit (HOSPITAL_BASED_OUTPATIENT_CLINIC_OR_DEPARTMENT_OTHER): Payer: Medicare Other

## 2016-05-28 ENCOUNTER — Ambulatory Visit (HOSPITAL_BASED_OUTPATIENT_CLINIC_OR_DEPARTMENT_OTHER): Payer: Medicare Other | Admitting: Hematology & Oncology

## 2016-05-28 VITALS — BP 149/75 | HR 86 | Temp 98.0°F | Resp 17 | Wt 152.0 lb

## 2016-05-28 DIAGNOSIS — D508 Other iron deficiency anemias: Secondary | ICD-10-CM

## 2016-05-28 DIAGNOSIS — D649 Anemia, unspecified: Secondary | ICD-10-CM

## 2016-05-28 DIAGNOSIS — D473 Essential (hemorrhagic) thrombocythemia: Secondary | ICD-10-CM

## 2016-05-28 LAB — CBC WITH DIFFERENTIAL (CANCER CENTER ONLY)
BASO#: 0 10*3/uL (ref 0.0–0.2)
BASO%: 0.6 % (ref 0.0–2.0)
EOS ABS: 0.1 10*3/uL (ref 0.0–0.5)
EOS%: 1.9 % (ref 0.0–7.0)
HCT: 27.5 % — ABNORMAL LOW (ref 34.8–46.6)
HGB: 8.7 g/dL — ABNORMAL LOW (ref 11.6–15.9)
LYMPH#: 1.4 10*3/uL (ref 0.9–3.3)
LYMPH%: 22.4 % (ref 14.0–48.0)
MCH: 25.5 pg — AB (ref 26.0–34.0)
MCHC: 31.6 g/dL — AB (ref 32.0–36.0)
MCV: 81 fL (ref 81–101)
MONO#: 0.7 10*3/uL (ref 0.1–0.9)
MONO%: 11.1 % (ref 0.0–13.0)
NEUT#: 4.1 10*3/uL (ref 1.5–6.5)
NEUT%: 64 % (ref 39.6–80.0)
PLATELETS: 559 10*3/uL — AB (ref 145–400)
RBC: 3.41 10*6/uL — ABNORMAL LOW (ref 3.70–5.32)
RDW: 18.8 % — ABNORMAL HIGH (ref 11.1–15.7)
WBC: 6.4 10*3/uL (ref 3.9–10.0)

## 2016-05-28 LAB — COMPREHENSIVE METABOLIC PANEL
ALT: 26 U/L (ref 0–55)
ANION GAP: 9 meq/L (ref 3–11)
AST: 20 U/L (ref 5–34)
Albumin: 3.8 g/dL (ref 3.5–5.0)
Alkaline Phosphatase: 125 U/L (ref 40–150)
BILIRUBIN TOTAL: 0.45 mg/dL (ref 0.20–1.20)
BUN: 24.5 mg/dL (ref 7.0–26.0)
CALCIUM: 9.4 mg/dL (ref 8.4–10.4)
CO2: 27 meq/L (ref 22–29)
CREATININE: 1 mg/dL (ref 0.6–1.1)
Chloride: 105 mEq/L (ref 98–109)
EGFR: 66 mL/min/{1.73_m2} — ABNORMAL LOW (ref >90)
Glucose: 146 mg/dl — ABNORMAL HIGH (ref 70–140)
Potassium: 3.8 mEq/L (ref 3.5–5.1)
Sodium: 140 mEq/L (ref 136–145)
TOTAL PROTEIN: 7.4 g/dL (ref 6.4–8.3)

## 2016-05-28 LAB — IRON AND TIBC
%SAT: 34 % (ref 21–57)
Iron: 74 ug/dL (ref 41–142)
TIBC: 220 ug/dL — ABNORMAL LOW (ref 236–444)
UIBC: 146 ug/dL (ref 120–384)

## 2016-05-28 LAB — FERRITIN: Ferritin: 228 ng/ml (ref 9–269)

## 2016-05-28 MED ORDER — INTEGRA 62.5-62.5-40-3 MG PO CAPS: 1.0000 | ORAL_CAPSULE | Freq: Every day | ORAL | 8 refills | Status: DC

## 2016-05-28 NOTE — Progress Notes (Signed)
Hematology and Oncology Follow Up Visit  Alexandra [name] [id number] 1950/11/14 66 y.o. 05/28/2016   Principle Diagnosis:   Anagrelide 5 mg by mouth daily - dose changed today  Aspirin 81 mg by mouth daily  Current Therapy:    Essential thrombocythemia-Calreticulin positive       Interim History:  [name] is back for followup. She feels well. She's had no problems since we last saw her. We did increase her dose of anagrelide.  She's had some slight fatigue. She's had no obvious bleeding.  She's had no fever. She's had no rashes. She's had no cough.  She is enjoying retirement. She is trying to exercise. She's trying to stay busy.  A sister in [place] is not doing too well. She apparently has a pulmonary issue. Alexandra [name] might have to go down to see her.  She's had no abdominal pain. She's had no obvious change in bowel or bladder habits.   Overall, her performance status is ECOG 0.  Medications:  Current Outpatient Prescriptions:  .  amLODipine (NORVASC) 2.5 MG tablet, Take 1 tablet (2.5 mg total) by mouth daily. Take with 5 mg total 7.5 mg, Disp: 90 tablet, Rfl: 1 .  amLODipine (NORVASC) 5 MG tablet, TAKE ONE TABLET BY MOUTH ONCE DAILY, Disp: 30 tablet, Rfl: 5 .  anagrelide (AGRYLIN) 1 MG capsule, Take 5 capsules (5 mg total) by mouth daily., Disp: 150 capsule, Rfl: 4 .  aspirin 81 MG tablet, Take 81 mg by mouth daily., Disp: , Rfl:  .  atorvastatin (LIPITOR) 10 MG tablet, Take 1 tablet (10 mg total) by mouth daily., Disp: 30 tablet, Rfl: 5 .  BYSTOLIC 20 MG TABS, TAKE ONE TABLET BY MOUTH ONCE DAILY, Disp: 90 tablet, Rfl: 0 .  Fe Fum-FePoly-Vit C-Vit B3 (INTEGRA) 62.5-62.5-40-3 MG CAPS, TAKE 1 CAPSULE BY MOUTH EVERY DAY, Disp: 30 capsule, Rfl: 0 .  glucose blood (ONE TOUCH ULTRA TEST) test strip, Use to test blood sugar 1 times daily. **PT NEEDS FOLLOW UP APPT FOR FURTHER REFILLS**, Disp: 100 each, Rfl: 0 .  lisinopril (PRINIVIL,ZESTRIL) 20 MG tablet, TAKE TWO  TABLETS BY MOUTH ONCE DAILY, Disp: 180 tablet, Rfl: 0 .  ONETOUCH DELICA LANCETS 29J MISC, Use to test blood sugar 1 time daily. **PT NEEDS FOLLOW UP APPT FOR FURTHER REFILLS**, Disp: 100 each, Rfl: 1 .  Vitamin D, Ergocalciferol, (DRISDOL) 50000 units CAPS capsule, Take 1 capsule (50,000 Units total) by mouth once a week., Disp: 12 capsule, Rfl: 3  Allergies: No Known Allergies  Past Medical History, Surgical history, Social history, and Family History were reviewed and updated.  Review of Systems: As above  Physical Exam:  weight is 152 lb (68.9 kg). Her oral temperature is 98 F (36.7 C). Her blood pressure is 149/75 (abnormal) and her pulse is 86. Her respiration is 17 and oxygen saturation is 100%.   Well-developed and well-nourished African American female. Head and neck exam shows no ocular or oral lesions. There are no palpable cervical or supraclavicular lymph nodes. Lungs are clear. Cardiac exam regular rate and rhythm with no murmurs rubs or bruits. Abdomen is soft. She is good bowel sounds. There is no fluid wave. There is no palpable liver or spleen tip. Back exam shows no tenderness over the spine ribs or hips. Extremities shows no clubbing cyanosis or edema. Skin exam shows no erythema on the palms of her hand. She no ecchymoses or petechia. Neurological exam is nonfocal.  Lab Results  Component  Value Date   WBC 6.4 05/28/2016   HGB 8.7 (L) 05/28/2016   HCT 27.5 (L) 05/28/2016   MCV 81 05/28/2016   PLT 559 (H) 05/28/2016     Chemistry      Component Value Date/Time   NA 143 05/14/2016 0926   NA 140 04/17/2016 0858   K 4.2 05/14/2016 0926   K 3.6 04/17/2016 0858   CL 106 05/14/2016 0926   CL 99 03/11/2016 1525   CO2 30 05/14/2016 0926   CO2 25 04/17/2016 0858   BUN 26 (H) 05/14/2016 0926   BUN 24.8 04/17/2016 0858   CREATININE 0.85 05/14/2016 0926   CREATININE 1.0 04/17/2016 0858      Component Value Date/Time   CALCIUM 9.4 05/14/2016 0926   CALCIUM 9.4  04/17/2016 0858   ALKPHOS 106 05/14/2016 0926   ALKPHOS 122 04/17/2016 0858   AST 14 05/14/2016 0926   AST 17 04/17/2016 0858   ALT 13 05/14/2016 0926   ALT 18 04/17/2016 0858   BILITOT 0.5 05/14/2016 0926   BILITOT 0.50 04/17/2016 0858         Impression and Plan: Alexandra [name] is 66 year old [name]. She has essential thrombocythemia. She actually is Calreticulin positive.  It really troubles me that her hemoglobin has dropped. I worry that she is developing some scar tissue in the bone marrow. I will get her blood smear. I do not seething that looked too troublesome to me. I do not seizing that looked like transformation.  I think we probably are going to have to consider a bone marrow test on her. I think this might be some that will need to be done if we see that her blood count drops.  I'm checking an erythropoietin level on her. Her last erythropoietin level was only 54. This was back in 2016. If necessary, we may have to consider giving her an ESA.  She is asymptomatic with this. Her heart rate is not tachycardic. Her blood pressure is stable.  I would like to see her back in 6 weeks.  I spent about 25 minutes with her today.     [name], MD 3/29/201811:59 AM

## 2016-05-29 LAB — RETICULOCYTES: Reticulocyte Count: 1.1 % (ref 0.6–2.6)

## 2016-06-02 ENCOUNTER — Other Ambulatory Visit: Payer: Self-pay | Admitting: Hematology & Oncology

## 2016-06-02 DIAGNOSIS — D509 Iron deficiency anemia, unspecified: Secondary | ICD-10-CM

## 2016-06-25 ENCOUNTER — Other Ambulatory Visit: Payer: Self-pay | Admitting: Emergency Medicine

## 2016-06-25 ENCOUNTER — Other Ambulatory Visit: Payer: Self-pay | Admitting: Internal Medicine

## 2016-07-01 ENCOUNTER — Other Ambulatory Visit: Payer: Self-pay | Admitting: Internal Medicine

## 2016-07-01 DIAGNOSIS — Z1231 Encounter for screening mammogram for malignant neoplasm of breast: Secondary | ICD-10-CM

## 2016-07-14 ENCOUNTER — Other Ambulatory Visit: Payer: Medicare Other

## 2016-07-14 ENCOUNTER — Ambulatory Visit: Payer: Medicare Other | Admitting: Hematology & Oncology

## 2016-07-17 ENCOUNTER — Other Ambulatory Visit: Payer: Self-pay | Admitting: Internal Medicine

## 2016-07-20 ENCOUNTER — Ambulatory Visit
Admission: RE | Admit: 2016-07-20 | Discharge: 2016-07-20 | Disposition: A | Discharge status: Home / Self Care | Payer: Medicare Other | Source: Ambulatory Visit | Attending: Internal Medicine | Admitting: Internal Medicine

## 2016-07-20 DIAGNOSIS — Z1231 Encounter for screening mammogram for malignant neoplasm of breast: Secondary | ICD-10-CM

## 2016-07-20 DIAGNOSIS — E2839 Other primary ovarian failure: Secondary | ICD-10-CM

## 2016-07-24 ENCOUNTER — Other Ambulatory Visit (HOSPITAL_BASED_OUTPATIENT_CLINIC_OR_DEPARTMENT_OTHER): Payer: Medicare Other

## 2016-07-24 DIAGNOSIS — D473 Essential (hemorrhagic) thrombocythemia: Secondary | ICD-10-CM | POA: Diagnosis not present

## 2016-07-24 DIAGNOSIS — D508 Other iron deficiency anemias: Secondary | ICD-10-CM

## 2016-07-24 LAB — IRON AND TIBC
%SAT: 30 % (ref 21–57)
Iron: 66 ug/dL (ref 41–142)
TIBC: 218 ug/dL — ABNORMAL LOW (ref 236–444)
UIBC: 152 ug/dL (ref 120–384)

## 2016-07-24 LAB — COMPREHENSIVE METABOLIC PANEL
ALT: 15 U/L (ref 0–55)
ANION GAP: 8 meq/L (ref 3–11)
AST: 13 U/L (ref 5–34)
Albumin: 3.8 g/dL (ref 3.5–5.0)
Alkaline Phosphatase: 104 U/L (ref 40–150)
BILIRUBIN TOTAL: 0.55 mg/dL (ref 0.20–1.20)
BUN: 30.3 mg/dL — ABNORMAL HIGH (ref 7.0–26.0)
CALCIUM: 9.3 mg/dL (ref 8.4–10.4)
CO2: 26 meq/L (ref 22–29)
CREATININE: 1.1 mg/dL (ref 0.6–1.1)
Chloride: 105 mEq/L (ref 98–109)
EGFR: 61 mL/min/{1.73_m2} — ABNORMAL LOW (ref >90)
Glucose: 108 mg/dl (ref 70–140)
Potassium: 3.6 mEq/L (ref 3.5–5.1)
Sodium: 140 mEq/L (ref 136–145)
TOTAL PROTEIN: 7.5 g/dL (ref 6.4–8.3)

## 2016-07-24 LAB — LACTATE DEHYDROGENASE: LDH: 235 U/L (ref 125–245)

## 2016-07-24 LAB — CBC & DIFF AND RETIC
BASO%: 0.6 % (ref 0.0–2.0)
BASOS ABS: 0 10*3/uL (ref 0.0–0.1)
EOS%: 1.5 % (ref 0.0–7.0)
Eosinophils Absolute: 0.1 10*3/uL (ref 0.0–0.5)
HCT: 27.8 % — ABNORMAL LOW (ref 34.8–46.6)
HEMOGLOBIN: 8.8 g/dL — AB (ref 11.6–15.9)
Immature Retic Fract: 11.8 % — ABNORMAL HIGH (ref 1.60–10.00)
LYMPH%: 23 % (ref 14.0–49.7)
MCH: 24.7 pg — AB (ref 25.1–34.0)
MCHC: 31.7 g/dL (ref 31.5–36.0)
MCV: 78.1 fL — ABNORMAL LOW (ref 79.5–101.0)
MONO#: 0.8 10*3/uL (ref 0.1–0.9)
MONO%: 11.1 % (ref 0.0–14.0)
NEUT#: 4.4 10*3/uL (ref 1.5–6.5)
NEUT%: 63.8 % (ref 38.4–76.8)
NRBC: 0 % (ref 0–0)
Platelets: 501 10*3/uL — ABNORMAL HIGH (ref 145–400)
RBC: 3.56 10*6/uL — ABNORMAL LOW (ref 3.70–5.45)
RDW: 19.6 % — AB (ref 11.2–14.5)
Retic %: 1.26 % (ref 0.70–2.10)
Retic Ct Abs: 44.86 10*3/uL (ref 33.70–90.70)
WBC: 6.9 10*3/uL (ref 3.9–10.3)
lymph#: 1.6 10*3/uL (ref 0.9–3.3)

## 2016-07-24 LAB — FERRITIN: FERRITIN: 230 ng/mL (ref 9–269)

## 2016-07-25 LAB — ERYTHROPOIETIN: Erythropoietin: 30.3 m[IU]/mL — ABNORMAL HIGH (ref 2.6–18.5)

## 2016-07-28 ENCOUNTER — Ambulatory Visit (HOSPITAL_BASED_OUTPATIENT_CLINIC_OR_DEPARTMENT_OTHER): Payer: Medicare Other | Admitting: Hematology & Oncology

## 2016-07-28 VITALS — BP 146/85 | HR 90 | Temp 98.5°F | Resp 16 | Wt 150.8 lb

## 2016-07-28 DIAGNOSIS — D473 Essential (hemorrhagic) thrombocythemia: Secondary | ICD-10-CM | POA: Diagnosis not present

## 2016-07-28 NOTE — Progress Notes (Signed)
Hematology and Oncology Follow Up Visit  Alexandra [name] [id number] 08-29-1950 66 y.o. 07/28/2016   Principle Diagnosis:   Anagrelide 5 mg by mouth daily - dose changed today  Aspirin 81 mg by mouth daily  Current Therapy:    Essential thrombocythemia-Calreticulin positive       Interim History:  [name] is back for followup. She feels well. She had a good Memorial Day weekend.  She's done well with the increase in anagrelide.  She has had no rashes. She's had no leg swelling. She's had no nausea or vomiting. She's had no cough or shortness of breath.  There is no significant fatigue. She is exercising. She does try to exercise daily.  Her iron studies that were done on Friday showed a ferritin of 2:30 with iron saturation of 30%.  We have not done a abdominal ultrasound on her. I thought this might be somebody that would be reasonable to do so that we can make sure that there is nothing going on with her spleen. She will think about this.  Her erythropoietin level is only 31. This, is low for her. As such, we can certainly consider using Aranesp to try to help with her hemoglobin. I talked to her about this. She has always been very "hands-off" with respect to her blood problem. She really is not bothered by her anemia so I don't think we have to worry about Aranesp right now.    Overall, her performance status is ECOG 0.  Medications:  Current Outpatient Prescriptions:  .  amLODipine (NORVASC) 2.5 MG tablet, Take 1 tablet (2.5 mg total) by mouth daily. Take with 5 mg total 7.5 mg, Disp: 90 tablet, Rfl: 1 .  amLODipine (NORVASC) 5 MG tablet, TAKE ONE TABLET BY MOUTH ONCE DAILY, Disp: 90 tablet, Rfl: 1 .  anagrelide (AGRYLIN) 1 MG capsule, Take 5 capsules (5 mg total) by mouth daily., Disp: 150 capsule, Rfl: 4 .  aspirin 81 MG tablet, Take 81 mg by mouth daily., Disp: , Rfl:  .  BYSTOLIC 20 MG TABS, TAKE ONE TABLET BY MOUTH ONCE DAILY, Disp: 90 tablet, Rfl: 0 .  Fe  Fum-FePoly-Vit C-Vit B3 (INTEGRA) 62.5-62.5-40-3 MG CAPS, Take 1 tablet by mouth daily., Disp: 30 capsule, Rfl: 8 .  glucose blood (ONE TOUCH ULTRA TEST) test strip, Use to test blood sugar 1 times daily. **PT NEEDS FOLLOW UP APPT FOR FURTHER REFILLS**, Disp: 100 each, Rfl: 0 .  lisinopril (PRINIVIL,ZESTRIL) 20 MG tablet, TAKE 2 TABLETS BY MOUTH ONCE DAILY, Disp: 180 tablet, Rfl: 0 .  ONETOUCH DELICA LANCETS 70W MISC, Use to test blood sugar 1 time daily. **PT NEEDS FOLLOW UP APPT FOR FURTHER REFILLS**, Disp: 100 each, Rfl: 1 .  Vitamin D, Ergocalciferol, (DRISDOL) 50000 units CAPS capsule, Take 1 capsule (50,000 Units total) by mouth once a week., Disp: 12 capsule, Rfl: 3  Allergies: No Known Allergies  Past Medical History, Surgical history, Social history, and Family History were reviewed and updated.  Review of Systems: As above  Physical Exam:  weight is 150 lb 12 oz (68.4 kg). Her oral temperature is 98.5 F (36.9 C). Her blood pressure is 146/85 (abnormal) and her pulse is 90. Her respiration is 16 and oxygen saturation is 100%.   Well-developed and well-nourished African American female. Head and neck exam shows no ocular or oral lesions. There are no palpable cervical or supraclavicular lymph nodes. Lungs are clear. Cardiac exam regular rate and rhythm with no murmurs rubs or  bruits. Abdomen is soft. She is good bowel sounds. There is no fluid wave. There is no palpable liver or spleen tip. Back exam shows no tenderness over the spine ribs or hips. Extremities shows no clubbing cyanosis or edema. Skin exam shows no erythema on the palms of her hand. She no ecchymoses or petechia. Neurological exam is nonfocal.  Lab Results  Component Value Date   WBC 6.9 07/24/2016   HGB 8.8 (L) 07/24/2016   HCT 27.8 (L) 07/24/2016   MCV 78.1 (L) 07/24/2016   PLT 501 (H) 07/24/2016     Chemistry      Component Value Date/Time   NA 140 07/24/2016 1007   K 3.6 07/24/2016 1007   CL 106  05/14/2016 0926   CL 99 03/11/2016 1525   CO2 26 07/24/2016 1007   BUN 30.3 (H) 07/24/2016 1007   CREATININE 1.1 07/24/2016 1007      Component Value Date/Time   CALCIUM 9.3 07/24/2016 1007   ALKPHOS 104 07/24/2016 1007   AST 13 07/24/2016 1007   ALT 15 07/24/2016 1007   BILITOT 0.55 07/24/2016 1007         Impression and Plan: Alexandra [name] is 66 year old [name]. She has essential thrombocythemia. She actually is Calreticulin positive.  We will plan to get her back in 6 weeks. Again, she does not like to come in too often. She feels okay.  She will think about the ultrasound.   I spent about 25 minutes with her today.     [name], MD 5/29/20181:53 PM

## 2016-08-11 ENCOUNTER — Other Ambulatory Visit: Payer: Self-pay | Admitting: Internal Medicine

## 2016-08-21 ENCOUNTER — Ambulatory Visit: Payer: No Typology Code available for payment source | Admitting: Internal Medicine

## 2016-09-10 ENCOUNTER — Other Ambulatory Visit (HOSPITAL_BASED_OUTPATIENT_CLINIC_OR_DEPARTMENT_OTHER): Payer: Medicare Other

## 2016-09-10 ENCOUNTER — Ambulatory Visit (HOSPITAL_BASED_OUTPATIENT_CLINIC_OR_DEPARTMENT_OTHER): Payer: Medicare Other | Admitting: Family

## 2016-09-10 VITALS — BP 158/86 | HR 87 | Temp 98.6°F | Resp 18 | Wt 150.0 lb

## 2016-09-10 DIAGNOSIS — D473 Essential (hemorrhagic) thrombocythemia: Secondary | ICD-10-CM

## 2016-09-10 LAB — CMP (CANCER CENTER ONLY)
ALT(SGPT): 26 U/L (ref 10–47)
AST: 22 U/L (ref 11–38)
Albumin: 3.4 g/dL (ref 3.3–5.5)
Alkaline Phosphatase: 91 U/L — ABNORMAL HIGH (ref 26–84)
BUN: 17 mg/dL (ref 7–22)
CALCIUM: 9.2 mg/dL (ref 8.0–10.3)
CO2: 29 mEq/L (ref 18–33)
Chloride: 107 mEq/L (ref 98–108)
Creat: 1.1 mg/dl (ref 0.6–1.2)
GLUCOSE: 119 mg/dL — AB (ref 73–118)
POTASSIUM: 3.6 meq/L (ref 3.3–4.7)
Sodium: 140 mEq/L (ref 128–145)
Total Bilirubin: 0.9 mg/dl (ref 0.20–1.60)
Total Protein: 7.4 g/dL (ref 6.4–8.1)

## 2016-09-10 LAB — CBC WITH DIFFERENTIAL (CANCER CENTER ONLY)
BASO#: 0 10*3/uL (ref 0.0–0.2)
BASO%: 0.6 % (ref 0.0–2.0)
EOS ABS: 0.2 10*3/uL (ref 0.0–0.5)
EOS%: 3.1 % (ref 0.0–7.0)
HEMATOCRIT: 27.8 % — AB (ref 34.8–46.6)
HGB: 8.9 g/dL — ABNORMAL LOW (ref 11.6–15.9)
LYMPH#: 1.7 10*3/uL (ref 0.9–3.3)
LYMPH%: 24.7 % (ref 14.0–48.0)
MCH: 25.7 pg — AB (ref 26.0–34.0)
MCHC: 32 g/dL (ref 32.0–36.0)
MCV: 80 fL — AB (ref 81–101)
MONO#: 0.8 10*3/uL (ref 0.1–0.9)
MONO%: 11.5 % (ref 0.0–13.0)
NEUT#: 4.1 10*3/uL (ref 1.5–6.5)
NEUT%: 60.1 % (ref 39.6–80.0)
PLATELETS: 507 10*3/uL — AB (ref 145–400)
RBC: 3.46 10*6/uL — ABNORMAL LOW (ref 3.70–5.32)
RDW: 18.6 % — AB (ref 11.1–15.7)
WBC: 6.9 10*3/uL (ref 3.9–10.0)

## 2016-09-10 LAB — LACTATE DEHYDROGENASE: LDH: 234 U/L (ref 125–245)

## 2016-09-10 LAB — IRON AND TIBC
%SAT: 35 % (ref 21–57)
IRON: 71 ug/dL (ref 41–142)
TIBC: 204 ug/dL — AB (ref 236–444)
UIBC: 133 ug/dL (ref 120–384)

## 2016-09-10 LAB — FERRITIN: Ferritin: 278 ng/ml — ABNORMAL HIGH (ref 9–269)

## 2016-09-10 NOTE — Progress Notes (Signed)
Hematology and Oncology Follow Up Visit  Alexandra [name] [id number] [birth year] 66 y.o. 09/10/2016   Principle Diagnosis:  Essential thrombocythemia - Calreticulin positive  Current Therapy:   Anagrelide 5 mg by mouth daily Aspirin 81 mg by mouth daily    Interim History:  Alexandra [name] is here today for follow-up. She is doing well and has no complaints at this time. She enjoys walking in the mornings for exercise. She is meeting with a friend for lunch today.  She is doing well on Anagrelide and aspirin. She verbalized that she is taking her medication as prescribed.  She denies any episodes of bleeding. No petechiae or bruising.  No lymphadenopathy found on exam.  No fever, chills, n/v, cough, rash, dizziness, SOB, chest pain, palpitations, abdominal pain or changes in bowel or bladder habits.  No swelling, tenderness, numbness or tingling in her extremities. No c/o pain.  She has maintained a good appetite and is staying well hydrated. Her weight is stable.   ECOG Performance Status: 0 - Asymptomatic  Medications:  Allergies as of 09/10/2016   No Known Allergies     Medication List       Accurate as of 09/10/16 11:44 AM. Always use your most recent med list.          amLODipine 2.5 MG tablet Commonly known as:  NORVASC Take 1 tablet (2.5 mg total) by mouth daily. Take with 5 mg total 7.5 mg   amLODipine 5 MG tablet Commonly known as:  NORVASC TAKE ONE TABLET BY MOUTH ONCE DAILY   anagrelide 1 MG capsule Commonly known as:  AGRYLIN Take 5 capsules (5 mg total) by mouth daily.   aspirin 81 MG tablet Take 81 mg by mouth daily.   BYSTOLIC 20 MG Tabs Generic drug:  Nebivolol HCl TAKE 1 TABLET BY MOUTH ONCE DAILY   glucose blood test strip Commonly known as:  ONE TOUCH ULTRA TEST Use to test blood sugar 1 times daily. **PT NEEDS FOLLOW UP APPT FOR FURTHER REFILLS**   INTEGRA 62.5-62.5-40-3 MG Caps Take 1 tablet by mouth daily.   lisinopril 20 MG tablet Commonly  known as:  PRINIVIL,ZESTRIL TAKE 2 TABLETS BY MOUTH ONCE DAILY   ONETOUCH DELICA LANCETS 94B Misc Use to test blood sugar 1 time daily. **PT NEEDS FOLLOW UP APPT FOR FURTHER REFILLS**   Vitamin D (Ergocalciferol) 50000 units Caps capsule Commonly known as:  DRISDOL Take 1 capsule (50,000 Units total) by mouth once a week.       Allergies: No Known Allergies  Past Medical History, Surgical history, Social history, and Family History were reviewed and updated.  Review of Systems: All other 10 point review of systems is negative.   Physical Exam:  weight is 150 lb (68 kg). Her oral temperature is 98.6 F (37 C). Her blood pressure is 158/86 (abnormal) and her pulse is 87. Her respiration is 18 and oxygen saturation is 100%.   Wt Readings from Last 3 Encounters:  09/10/16 150 lb (68 kg)  07/28/16 150 lb 12 oz (68.4 kg)  05/28/16 152 lb (68.9 kg)    Ocular: Sclerae unicteric, pupils equal, round and reactive to light Ear-nose-throat: Oropharynx clear, dentition fair Lymphatic: No cervical, supraclavicular or axillary adenopathy Lungs no rales or rhonchi, good excursion bilaterally Heart regular rate and rhythm, no murmur appreciated Abd soft, nontender, positive bowel sounds, no liver or spleen tip palpated on exam, no fluid wave MSK no focal spinal tenderness, no joint edema Neuro: non-focal, well-oriented, appropriate  affect Breasts: Deferred   Lab Results  Component Value Date   WBC 6.9 09/10/2016   HGB 8.9 (L) 09/10/2016   HCT 27.8 (L) 09/10/2016   MCV 80 (L) 09/10/2016   PLT 507 (H) 09/10/2016   Lab Results  Component Value Date   FERRITIN 230 07/24/2016   IRON 66 07/24/2016   TIBC 218 (L) 07/24/2016   UIBC 152 07/24/2016   IRONPCTSAT 30 07/24/2016   Lab Results  Component Value Date   RETICCTPCT 1.26 07/24/2016   RBC 3.46 (L) 09/10/2016   RETICCTABS 44.86 07/24/2016   No results found for: KPAFRELGTCHN, LAMBDASER, KAPLAMBRATIO No results found for:  IGGSERUM, IGA, IGMSERUM No results found for: Odetta Pink, SPEI   Chemistry      Component Value Date/Time   NA 140 07/24/2016 1007   K 3.6 07/24/2016 1007   CL 106 05/14/2016 0926   CL 99 03/11/2016 1525   CO2 26 07/24/2016 1007   BUN 30.3 (H) 07/24/2016 1007   CREATININE 1.1 07/24/2016 1007      Component Value Date/Time   CALCIUM 9.3 07/24/2016 1007   ALKPHOS 104 07/24/2016 1007   AST 13 07/24/2016 1007   ALT 15 07/24/2016 1007   BILITOT 0.55 07/24/2016 1007      Impression and Plan: Alexandra [name] is a very pleasant 65 yo African American female with essential thrombocytopenia and is Calreticulin positive. She is tolerating Anagrelide and aspirin nicely and has no complaints at this time. She will continue her same regimen.  We will plan to se her back again in 2 months for repeat lab work and follow-up.  She will contact our office with any questions or concerns. We can certainly see her sooner if need be.   Eliezer Bottom, NP 7/12/201811:44 AM

## 2016-09-11 LAB — RETICULOCYTES: Reticulocyte Count: 1.1 % (ref 0.6–2.6)

## 2016-10-01 ENCOUNTER — Other Ambulatory Visit: Payer: Self-pay | Admitting: Hematology & Oncology

## 2016-10-01 DIAGNOSIS — D509 Iron deficiency anemia, unspecified: Secondary | ICD-10-CM

## 2016-10-01 DIAGNOSIS — D473 Essential (hemorrhagic) thrombocythemia: Secondary | ICD-10-CM

## 2016-10-28 ENCOUNTER — Telehealth: Payer: Self-pay | Admitting: Internal Medicine

## 2016-10-28 ENCOUNTER — Other Ambulatory Visit: Payer: Self-pay | Admitting: Hematology & Oncology

## 2016-10-28 DIAGNOSIS — D509 Iron deficiency anemia, unspecified: Secondary | ICD-10-CM

## 2016-10-28 DIAGNOSIS — D473 Essential (hemorrhagic) thrombocythemia: Secondary | ICD-10-CM

## 2016-10-28 NOTE — Telephone Encounter (Signed)
Patient is overdue for f/u and lab appointment. Sent in a 30 day supply until patient can get labs done and OV scheduled. Please help patient appointment.

## 2016-10-29 NOTE — Telephone Encounter (Signed)
lmom for pt to callback. Torrie please put lab order in

## 2016-10-30 NOTE — Telephone Encounter (Signed)
Lab orders have already been placed

## 2016-11-03 NOTE — Telephone Encounter (Signed)
lmom for pt to call back

## 2016-11-05 NOTE — Telephone Encounter (Signed)
Alexandra [name] I do not see any lab order from dr Regis Bill. Pt has been sch for 11-16-16 and will come in fasting for lab

## 2016-11-05 NOTE — Telephone Encounter (Signed)
lmom for pt to call back

## 2016-11-06 ENCOUNTER — Other Ambulatory Visit (HOSPITAL_BASED_OUTPATIENT_CLINIC_OR_DEPARTMENT_OTHER): Payer: Medicare Other

## 2016-11-06 ENCOUNTER — Ambulatory Visit (HOSPITAL_BASED_OUTPATIENT_CLINIC_OR_DEPARTMENT_OTHER): Payer: Medicare Other | Admitting: Hematology & Oncology

## 2016-11-06 ENCOUNTER — Other Ambulatory Visit: Payer: Self-pay | Admitting: Emergency Medicine

## 2016-11-06 VITALS — BP 152/78 | HR 74 | Temp 97.7°F | Resp 17 | Wt 154.2 lb

## 2016-11-06 DIAGNOSIS — D473 Essential (hemorrhagic) thrombocythemia: Secondary | ICD-10-CM | POA: Diagnosis not present

## 2016-11-06 DIAGNOSIS — R710 Precipitous drop in hematocrit: Secondary | ICD-10-CM | POA: Diagnosis not present

## 2016-11-06 DIAGNOSIS — E785 Hyperlipidemia, unspecified: Secondary | ICD-10-CM

## 2016-11-06 DIAGNOSIS — D508 Other iron deficiency anemias: Secondary | ICD-10-CM

## 2016-11-06 LAB — CMP (CANCER CENTER ONLY)
ALK PHOS: 115 U/L — AB (ref 26–84)
ALT: 20 U/L (ref 10–47)
AST: 25 U/L (ref 11–38)
Albumin: 3.4 g/dL (ref 3.3–5.5)
BUN: 16 mg/dL (ref 7–22)
CO2: 31 mEq/L (ref 18–33)
CREATININE: 0.9 mg/dL (ref 0.6–1.2)
Calcium: 9.2 mg/dL (ref 8.0–10.3)
Chloride: 105 mEq/L (ref 98–108)
Glucose, Bld: 120 mg/dL — ABNORMAL HIGH (ref 73–118)
Potassium: 3.9 mEq/L (ref 3.3–4.7)
SODIUM: 143 meq/L (ref 128–145)
TOTAL PROTEIN: 7.2 g/dL (ref 6.4–8.1)
Total Bilirubin: 0.8 mg/dl (ref 0.20–1.60)

## 2016-11-06 LAB — CBC WITH DIFFERENTIAL (CANCER CENTER ONLY)
BASO#: 0 10*3/uL (ref 0.0–0.2)
BASO%: 0.5 % (ref 0.0–2.0)
EOS%: 2.8 % (ref 0.0–7.0)
Eosinophils Absolute: 0.2 10*3/uL (ref 0.0–0.5)
HCT: 27.7 % — ABNORMAL LOW (ref 34.8–46.6)
HEMOGLOBIN: 8.7 g/dL — AB (ref 11.6–15.9)
LYMPH#: 1.5 10*3/uL (ref 0.9–3.3)
LYMPH%: 24.5 % (ref 14.0–48.0)
MCH: 25.4 pg — ABNORMAL LOW (ref 26.0–34.0)
MCHC: 31.4 g/dL — ABNORMAL LOW (ref 32.0–36.0)
MCV: 81 fL (ref 81–101)
MONO#: 0.7 10*3/uL (ref 0.1–0.9)
MONO%: 10.9 % (ref 0.0–13.0)
NEUT%: 61.3 % (ref 39.6–80.0)
NEUTROS ABS: 3.7 10*3/uL (ref 1.5–6.5)
PLATELETS: 586 10*3/uL — AB (ref 145–400)
RBC: 3.43 10*6/uL — AB (ref 3.70–5.32)
RDW: 19.3 % — ABNORMAL HIGH (ref 11.1–15.7)
WBC: 6 10*3/uL (ref 3.9–10.0)

## 2016-11-06 LAB — LACTATE DEHYDROGENASE: LDH: 258 U/L — ABNORMAL HIGH (ref 125–245)

## 2016-11-06 MED ORDER — FOLIC ACID 1 MG PO TABS: 2.0000 mg | ORAL_TABLET | Freq: Every day | ORAL | 6 refills | Status: DC

## 2016-11-06 NOTE — Telephone Encounter (Signed)
Lab order has been placed.  

## 2016-11-06 NOTE — Progress Notes (Signed)
Hematology and Oncology Follow Up Visit  VI BIDDINGER [id number] [birth year] 66 y.o. 11/06/2016   Principle Diagnosis:   Anagrelide 5 mg by mouth daily - dose changed today  Aspirin 81 mg by mouth daily  Current Therapy:    Essential thrombocythemia-Calreticulin positive       Interim History:  Ms.  Atayde is back for followup. She had a good summer. As always, she's been quite busy.  She's had no fever. She's had no nausea or vomiting. She's had no change in bowel or bladder habits.  She is tolerating the anagrelide pretty well.  Her blood pressure is up a little bit. She is on amlodipine.  I want to try her on some folic acid at 2 mg a day to see this may help with her blood.  She's had no issues with rashes. She's had no leg swelling.   Overall, her performance status is ECOG 0.  Medications:  Current Outpatient Prescriptions:  .  amLODipine (NORVASC) 2.5 MG tablet, Take 1 tablet (2.5 mg total) by mouth daily. Take with 5 mg total 7.5 mg, Disp: 90 tablet, Rfl: 1 .  amLODipine (NORVASC) 5 MG tablet, TAKE ONE TABLET BY MOUTH ONCE DAILY, Disp: 90 tablet, Rfl: 1 .  anagrelide (AGRYLIN) 1 MG capsule, Take 5 capsules (5 mg total) by mouth daily., Disp: 150 capsule, Rfl: 4 .  anagrelide (AGRYLIN) 1 MG capsule, TAKE 5 CAPSULES(5 MG) BY MOUTH DAILY, Disp: 150 capsule, Rfl: 0 .  aspirin 81 MG tablet, Take 81 mg by mouth daily., Disp: , Rfl:  .  BYSTOLIC 20 MG TABS, TAKE 1 TABLET BY MOUTH ONCE DAILY, Disp: 90 tablet, Rfl: 1 .  Fe Fum-FePoly-Vit C-Vit B3 (INTEGRA) 62.5-62.5-40-3 MG CAPS, Take 1 tablet by mouth daily., Disp: 30 capsule, Rfl: 8 .  folic acid (FOLVITE) 1 MG tablet, Take 2 tablets (2 mg total) by mouth daily., Disp: 60 tablet, Rfl: 6 .  glucose blood (ONE TOUCH ULTRA TEST) test strip, Use to test blood sugar 1 times daily. **PT NEEDS FOLLOW UP APPT FOR FURTHER REFILLS**, Disp: 100 each, Rfl: 0 .  lisinopril (PRINIVIL,ZESTRIL) 20 MG tablet, TAKE 2 TABLETS BY MOUTH ONCE  DAILY, Disp: 60 tablet, Rfl: 0 .  ONETOUCH DELICA LANCETS 55D MISC, Use to test blood sugar 1 time daily. **PT NEEDS FOLLOW UP APPT FOR FURTHER REFILLS**, Disp: 100 each, Rfl: 1 .  Vitamin D, Ergocalciferol, (DRISDOL) 50000 units CAPS capsule, Take 1 capsule (50,000 Units total) by mouth once a week., Disp: 12 capsule, Rfl: 3  Allergies: No Known Allergies  Past Medical History, Surgical history, Social history, and Family History were reviewed and updated.  Review of Systems: As above  Physical Exam:  weight is 154 lb 4 oz (70 kg). Her oral temperature is 97.7 F (36.5 C). Her blood pressure is 152/78 (abnormal) and her pulse is 74. Her respiration is 17 and oxygen saturation is 100%.   Well-developed and well-nourished African American female. Head and neck exam shows no scleral icterus. There is no adenopathy in the neck. She has no oral lesions. Lungs are clear. Cardiac exam regular rate and rhythm with no murmurs, rubs or bruits. Abdomen is soft. She has good bowel sounds. There is no fluid wave. There is no palpable liver or spleen tip. Back exam shows no tenderness over the spine, ribs or hips. 70 shows no clubbing, cyanosis or edema. Skin exam shows no rashes, ecchymoses or petechia.  Lab Results  Component Value Date  WBC 6.0 11/06/2016   HGB 8.7 (L) 11/06/2016   HCT 27.7 (L) 11/06/2016   MCV 81 11/06/2016   PLT 586 (H) 11/06/2016     Chemistry      Component Value Date/Time   NA 143 11/06/2016 1035   NA 140 07/24/2016 1007   K 3.9 11/06/2016 1035   K 3.6 07/24/2016 1007   CL 105 11/06/2016 1035   CO2 31 11/06/2016 1035   CO2 26 07/24/2016 1007   BUN 16 11/06/2016 1035   BUN 30.3 (H) 07/24/2016 1007   CREATININE 0.9 11/06/2016 1035   CREATININE 1.1 07/24/2016 1007      Component Value Date/Time   CALCIUM 9.2 11/06/2016 1035   CALCIUM 9.3 07/24/2016 1007   ALKPHOS 115 (H) 11/06/2016 1035   ALKPHOS 104 07/24/2016 1007   AST 25 11/06/2016 1035   AST 13 07/24/2016  1007   ALT 20 11/06/2016 1035   ALT 15 07/24/2016 1007   BILITOT 0.80 11/06/2016 1035   BILITOT 0.55 07/24/2016 1007         Impression and Plan: Ms. Pringle is 66 year old [name]. She has essential thrombocythemia. She actually is Calreticulin positive.  Hopefully, folic acid may help a little bit with her hemoglobin. We will try 2 mg a day.  I may have to consider another bone marrow test on her. Her last bone marrow test was 2 years ago.  I will like to see her back in another 6 weeks.  If her hemoglobin drops anymore, then I will definitely have to consider Aranesp. Her erythropoietin level is only 30. As such, I think she will respond to Aranesp if we give this a try. I must say that she is very well compensated.  As always, she is a lot of fun to talk to. She really has a lot to say. She alloys is so interesting. .     . [name], MD 9/7/201811:22 AM

## 2016-11-11 ENCOUNTER — Other Ambulatory Visit: Payer: Self-pay | Admitting: *Deleted

## 2016-11-11 DIAGNOSIS — D508 Other iron deficiency anemias: Secondary | ICD-10-CM

## 2016-11-11 DIAGNOSIS — D473 Essential (hemorrhagic) thrombocythemia: Secondary | ICD-10-CM

## 2016-11-11 MED ORDER — FOLIC ACID 1 MG PO TABS: 2.0000 mg | ORAL_TABLET | Freq: Every day | ORAL | 3 refills | Status: DC

## 2016-11-17 ENCOUNTER — Other Ambulatory Visit (INDEPENDENT_AMBULATORY_CARE_PROVIDER_SITE_OTHER): Payer: Medicare Other

## 2016-11-17 DIAGNOSIS — E785 Hyperlipidemia, unspecified: Secondary | ICD-10-CM | POA: Diagnosis not present

## 2016-11-17 LAB — LIPID PANEL
CHOL/HDL RATIO: 3
Cholesterol: 227 mg/dL — ABNORMAL HIGH (ref 0–200)
HDL: 71.9 mg/dL (ref >39.00)
LDL CALC: 143 mg/dL — AB (ref 0–99)
NONHDL: 155.09
TRIGLYCERIDES: 62 mg/dL (ref 0.0–149.0)
VLDL: 12.4 mg/dL (ref 0.0–40.0)

## 2016-11-18 ENCOUNTER — Other Ambulatory Visit: Payer: Medicare Other

## 2016-11-30 NOTE — Progress Notes (Signed)
Chief Complaint  Patient presents with  . Follow-up    Pt states she has been doing well  . Medication Refill    HPI: Alexandra [name] 66 y.o. come in for Chronic disease management  Bp:  147 range up in office  Taking 7.5 amlod 40 lisinopril and  20 bystolic  No cv opulm sx  DM last fbs 120 doing well   No meds  lsi walks daily no numbness vision changes or complications of  Elevated BG.    ROS: See pertinent positives and negatives per HPI. No bleeding neuro sx   Past Medical History:  Diagnosis Date  . Diabetes mellitus without complication ([place])   . Hypertension    echo   . Iron deficiency anemia, unspecified 03/15/2013  . Thrombocytosis ([facility])     Family History  Problem Relation Age of Onset  . Heart disease Mother        died age 70  . Kidney disease Mother        family hx  . Heart attack Father 69       massive  . Hypertension Sister   . Hypertension Sister   . Hypertension Sister   . Hypertension Sister   . Arthritis Unknown        family hx  . Diabetes Unknown        family hx  . Stroke Unknown         dialysis    Social History   Social History  . Marital status: Married    Spouse name: N/A  . Number of children: N/A  . Years of education: N/A   Social History Main Topics  . Smoking status: Never Smoker  . Smokeless tobacco: Never Used     Comment: never used tobacco  . Alcohol use No  . Drug use: No  . Sexual activity: Not Asked   Other Topics Concern  . 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   No tobacco no ethoh little caffiene .     Outpatient Medications Prior to Visit  Medication Sig Dispense Refill  . amLODipine (NORVASC) 2.5 MG tablet Take 1 tablet (2.5 mg total) by mouth daily. Take with 5 mg total 7.5 mg 90 tablet 1  . amLODipine (NORVASC) 5 MG tablet TAKE ONE TABLET BY MOUTH ONCE DAILY 90 tablet 1  . anagrelide (AGRYLIN) 1 MG capsule Take 5 capsules  (5 mg total) by mouth daily. 150 capsule 4  . aspirin 81 MG tablet Take 81 mg by mouth daily.    Marland Kitchen BYSTOLIC 20 MG TABS TAKE 1 TABLET BY MOUTH ONCE DAILY 90 tablet 1  . Fe Fum-FePoly-Vit C-Vit B3 (INTEGRA) 62.5-62.5-40-3 MG CAPS Take 1 tablet by mouth daily. 30 capsule 8  . folic acid (FOLVITE) 1 MG tablet Take 2 tablets (2 mg total) by mouth daily. 180 tablet 3  . glucose blood (ONE TOUCH ULTRA TEST) test strip Use to test blood sugar 1 times daily. **PT NEEDS FOLLOW UP APPT FOR FURTHER REFILLS** 100 each 0  . ONETOUCH DELICA LANCETS 47S MISC Use to test blood sugar 1 time daily. **PT NEEDS FOLLOW UP APPT FOR FURTHER REFILLS** 100 each 1  . Vitamin D, Ergocalciferol, (DRISDOL) 50000 units CAPS capsule Take 1 capsule (50,000 Units total) by mouth once a week. 12 capsule 3  .  anagrelide (AGRYLIN) 1 MG capsule TAKE 5 CAPSULES(5 MG) BY MOUTH DAILY 150 capsule 0  . lisinopril (PRINIVIL,ZESTRIL) 20 MG tablet TAKE 2 TABLETS BY MOUTH ONCE DAILY 60 tablet 0   No facility-administered medications prior to visit.      EXAM:  BP (!) 142/92 (BP Location: Right Arm, Patient Position: Sitting, Cuff Size: Normal)   Temp 97.8 F (36.6 C) (Oral)   Ht 5\' 11"  (1.803 m)   Wt 155 lb 3.2 oz (70.4 kg)   BMI 21.65 kg/m   Body mass index is 21.65 kg/m.  GENERAL: vitals reviewed and listed above, alert, oriented, appears well hydrated and in no acute distress HEENT: atraumatic, conjunctiva  clear, no obvious abnormalities on inspection of external nose and ears  NECK: no obvious masses on inspection palpation  LUNGS: clear to auscultation bilaterally, no wheezes, rales or rhonchi, good air movement CV: HRRR, no clubbing cyanosis or  peripheral edema nl cap refill  MS: moves all extremities without noticeable focal  abnormality PSYCH: pleasant and cooperative, no obvious depression or anxiety Lab Results  Component Value Date   WBC 6.0 11/06/2016   HGB 8.7 (L) 11/06/2016   HCT 27.7 (L) 11/06/2016   PLT  586 (H) 11/06/2016   GLUCOSE 120 (H) 11/06/2016   CHOL 227 (H) 11/17/2016   TRIG 62.0 11/17/2016   HDL 71.90 11/17/2016   LDLDIRECT 127.3 04/19/2012   LDLCALC 143 (H) 11/17/2016   ALT 20 11/06/2016   AST 25 11/06/2016   NA 143 11/06/2016   K 3.9 11/06/2016   CL 105 11/06/2016   CREATININE 0.9 11/06/2016   BUN 16 11/06/2016   CO2 31 11/06/2016   TSH 1.62 05/14/2016   HGBA1C 6.5 05/14/2016   MICROALBUR 1.8 05/14/2016   BP Readings from Last 3 Encounters:  12/01/16 (!) 142/92  11/06/16 (!) 152/78  09/10/16 (!) 158/86   Wt Readings from Last 3 Encounters:  12/01/16 155 lb 3.2 oz (70.4 kg)  11/06/16 154 lb 4 oz (70 kg)  09/10/16 150 lb (68 kg)     ASSESSMENT AND PLAN:  Discussed the following assessment and plan:  Essential hypertension  Medication management  Influenza vaccination declined by patient  Controlled type 2 diabetes mellitus without complication, without long-term current use of insulin ([place])  Essential thrombocythemia ([place])  Hx of hydronephrosis She is cautious and hesitant to take" too much med "but ok to inc amlodipine to 10 mg    At this time and continue same medications. Will follow  revewied other options for future  Ok to not add more blood tests and a1c today as she has control and levels are in the prediabetic range at this time.   And not important to her health to recheck today  Will check at her spring visit.  focusing on bp control  At this time.  Still much better than when began meds  -Patient advised to return or notify health care team  if  new concerns arise.  Patient Instructions  Increase the amlodipine to 10 mg per day .  As we discussed   bp goal  Is lower now for  Health.   Continue lifestyle intervention healthy eating and exercise . Plan cpx and labs in about 6 months       Standley Brooking. Nehan Flaum M.D.

## 2016-12-01 ENCOUNTER — Encounter: Payer: Self-pay | Admitting: Internal Medicine

## 2016-12-01 ENCOUNTER — Ambulatory Visit (INDEPENDENT_AMBULATORY_CARE_PROVIDER_SITE_OTHER): Payer: Medicare Other | Admitting: Internal Medicine

## 2016-12-01 VITALS — BP 142/92 | Temp 97.8°F | Ht 71.0 in | Wt 155.2 lb

## 2016-12-01 DIAGNOSIS — Z2821 Immunization not carried out because of patient refusal: Secondary | ICD-10-CM

## 2016-12-01 DIAGNOSIS — D473 Essential (hemorrhagic) thrombocythemia: Secondary | ICD-10-CM

## 2016-12-01 DIAGNOSIS — E119 Type 2 diabetes mellitus without complications: Secondary | ICD-10-CM | POA: Diagnosis not present

## 2016-12-01 DIAGNOSIS — Z79899 Other long term (current) drug therapy: Secondary | ICD-10-CM

## 2016-12-01 DIAGNOSIS — I10 Essential (primary) hypertension: Secondary | ICD-10-CM

## 2016-12-01 DIAGNOSIS — Z87448 Personal history of other diseases of urinary system: Secondary | ICD-10-CM | POA: Insufficient documentation

## 2016-12-01 MED ORDER — LISINOPRIL 20 MG PO TABS: 40.0000 mg | ORAL_TABLET | Freq: Every day | ORAL | 5 refills | Status: DC

## 2016-12-01 MED ORDER — AMLODIPINE BESYLATE 10 MG PO TABS: 10.0000 mg | ORAL_TABLET | Freq: Every day | ORAL | 6 refills | Status: DC

## 2016-12-01 NOTE — Patient Instructions (Signed)
Increase the amlodipine to 10 mg per day .  As we discussed   bp goal  Is lower now for  Health.   Continue lifestyle intervention healthy eating and exercise . Plan cpx and labs in about 6 months

## 2016-12-02 ENCOUNTER — Other Ambulatory Visit: Payer: Self-pay | Admitting: Hematology & Oncology

## 2016-12-02 DIAGNOSIS — D473 Essential (hemorrhagic) thrombocythemia: Secondary | ICD-10-CM

## 2016-12-02 DIAGNOSIS — D509 Iron deficiency anemia, unspecified: Secondary | ICD-10-CM

## 2016-12-17 ENCOUNTER — Other Ambulatory Visit (HOSPITAL_BASED_OUTPATIENT_CLINIC_OR_DEPARTMENT_OTHER): Payer: Medicare Other

## 2016-12-17 ENCOUNTER — Ambulatory Visit (HOSPITAL_BASED_OUTPATIENT_CLINIC_OR_DEPARTMENT_OTHER): Payer: Medicare Other | Admitting: Hematology & Oncology

## 2016-12-17 VITALS — BP 144/77 | HR 86 | Temp 97.9°F | Resp 16 | Wt 152.0 lb

## 2016-12-17 DIAGNOSIS — D508 Other iron deficiency anemias: Secondary | ICD-10-CM

## 2016-12-17 DIAGNOSIS — D473 Essential (hemorrhagic) thrombocythemia: Secondary | ICD-10-CM | POA: Diagnosis not present

## 2016-12-17 DIAGNOSIS — E119 Type 2 diabetes mellitus without complications: Secondary | ICD-10-CM

## 2016-12-17 LAB — CBC WITH DIFFERENTIAL (CANCER CENTER ONLY)
BASO#: 0 10*3/uL (ref 0.0–0.2)
BASO%: 0.5 % (ref 0.0–2.0)
EOS%: 2.1 % (ref 0.0–7.0)
Eosinophils Absolute: 0.1 10*3/uL (ref 0.0–0.5)
HCT: 28.1 % — ABNORMAL LOW (ref 34.8–46.6)
HEMOGLOBIN: 9 g/dL — AB (ref 11.6–15.9)
LYMPH#: 1.5 10*3/uL (ref 0.9–3.3)
LYMPH%: 23.8 % (ref 14.0–48.0)
MCH: 25.6 pg — AB (ref 26.0–34.0)
MCHC: 32 g/dL (ref 32.0–36.0)
MCV: 80 fL — ABNORMAL LOW (ref 81–101)
MONO#: 0.6 10*3/uL (ref 0.1–0.9)
MONO%: 9.3 % (ref 0.0–13.0)
NEUT%: 64.3 % (ref 39.6–80.0)
NEUTROS ABS: 4 10*3/uL (ref 1.5–6.5)
PLATELETS: 493 10*3/uL — AB (ref 145–400)
RBC: 3.51 10*6/uL — AB (ref 3.70–5.32)
RDW: 18.9 % — ABNORMAL HIGH (ref 11.1–15.7)
WBC: 6.3 10*3/uL (ref 3.9–10.0)

## 2016-12-17 LAB — IRON AND TIBC
%SAT: 35 % (ref 21–57)
IRON: 73 ug/dL (ref 41–142)
TIBC: 208 ug/dL — AB (ref 236–444)
UIBC: 134 ug/dL (ref 120–384)

## 2016-12-17 LAB — CHCC SATELLITE - SMEAR

## 2016-12-17 LAB — LACTATE DEHYDROGENASE: LDH: 238 U/L (ref 125–245)

## 2016-12-17 LAB — CMP (CANCER CENTER ONLY)
ALBUMIN: 3.6 g/dL (ref 3.3–5.5)
ALT(SGPT): 33 U/L (ref 10–47)
AST: 28 U/L (ref 11–38)
Alkaline Phosphatase: 103 U/L — ABNORMAL HIGH (ref 26–84)
BUN: 18 mg/dL (ref 7–22)
CHLORIDE: 104 meq/L (ref 98–108)
CO2: 30 meq/L (ref 18–33)
CREATININE: 0.9 mg/dL (ref 0.6–1.2)
Calcium: 9.3 mg/dL (ref 8.0–10.3)
Glucose, Bld: 127 mg/dL — ABNORMAL HIGH (ref 73–118)
Potassium: 3.4 mEq/L (ref 3.3–4.7)
SODIUM: 143 meq/L (ref 128–145)
TOTAL PROTEIN: 7.5 g/dL (ref 6.4–8.1)
Total Bilirubin: 0.8 mg/dl (ref 0.20–1.60)

## 2016-12-17 LAB — CBC AND DIFFERENTIAL
HEMATOCRIT: 28 — AB (ref 36–46)
Hemoglobin: 9 — AB (ref 12.0–16.0)
PLATELETS: 493 — AB (ref 150–399)
WBC: 6.3

## 2016-12-17 LAB — HEPATIC FUNCTION PANEL
ALT: 33 (ref 7–35)
AST: 28 (ref 13–35)
Alkaline Phosphatase: 103 (ref 25–125)
Bilirubin, Total: 0.8

## 2016-12-17 LAB — BASIC METABOLIC PANEL
BUN: 18 (ref 4–21)
CREATININE: 0.9 (ref 0.5–1.1)
GLUCOSE: 127
POTASSIUM: 3.4 (ref 3.4–5.3)
Sodium: 143 (ref 137–147)

## 2016-12-17 LAB — FERRITIN: Ferritin: 275 ng/ml — ABNORMAL HIGH (ref 9–269)
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
## 2016-12-18 ENCOUNTER — Ambulatory Visit: Payer: Medicare Other | Admitting: Hematology & Oncology

## 2016-12-18 ENCOUNTER — Other Ambulatory Visit: Payer: Medicare Other

## 2016-12-18 LAB — RETICULOCYTES: Reticulocyte Count: 1 % (ref 0.6–2.6)

## 2016-12-25 ENCOUNTER — Other Ambulatory Visit: Payer: Self-pay | Admitting: Hematology & Oncology

## 2016-12-28 ENCOUNTER — Encounter: Payer: Self-pay | Admitting: Internal Medicine

## 2016-12-28 LAB — IRON AND TIBC
%SAT: 35
Iron: 73
TIBC: 208

## 2016-12-28 LAB — FERRITIN: Ferritin: 278

## 2016-12-28 LAB — LACTATE DEHYDROGENASE: LDH: 238

## 2017-01-01 ENCOUNTER — Other Ambulatory Visit: Payer: Self-pay | Admitting: Hematology & Oncology

## 2017-01-01 DIAGNOSIS — D509 Iron deficiency anemia, unspecified: Secondary | ICD-10-CM

## 2017-01-01 DIAGNOSIS — D473 Essential (hemorrhagic) thrombocythemia: Secondary | ICD-10-CM

## 2017-02-02 ENCOUNTER — Other Ambulatory Visit: Payer: Self-pay | Admitting: Hematology & Oncology

## 2017-02-02 ENCOUNTER — Other Ambulatory Visit (HOSPITAL_BASED_OUTPATIENT_CLINIC_OR_DEPARTMENT_OTHER): Payer: Medicare Other

## 2017-02-02 ENCOUNTER — Ambulatory Visit (HOSPITAL_BASED_OUTPATIENT_CLINIC_OR_DEPARTMENT_OTHER): Payer: Medicare Other | Admitting: Hematology & Oncology

## 2017-02-02 ENCOUNTER — Other Ambulatory Visit: Payer: Self-pay

## 2017-02-02 VITALS — BP 149/83 | HR 80 | Temp 97.9°F | Resp 20 | Wt 155.5 lb

## 2017-02-02 DIAGNOSIS — D500 Iron deficiency anemia secondary to blood loss (chronic): Secondary | ICD-10-CM

## 2017-02-02 DIAGNOSIS — D509 Iron deficiency anemia, unspecified: Secondary | ICD-10-CM

## 2017-02-02 DIAGNOSIS — D473 Essential (hemorrhagic) thrombocythemia: Secondary | ICD-10-CM | POA: Diagnosis not present

## 2017-02-02 DIAGNOSIS — D508 Other iron deficiency anemias: Secondary | ICD-10-CM

## 2017-02-02 DIAGNOSIS — E119 Type 2 diabetes mellitus without complications: Secondary | ICD-10-CM

## 2017-02-02 LAB — CBC AND DIFFERENTIAL
HEMATOCRIT: 27 — AB (ref 36–46)
HEMOGLOBIN: 8.6 — AB (ref 12.0–16.0)
Platelets: 608 — AB (ref 150–399)
WBC: 7.5

## 2017-02-02 LAB — CMP (CANCER CENTER ONLY)
ALBUMIN: 3.4 g/dL (ref 3.3–5.5)
ALT(SGPT): 26 U/L (ref 10–47)
AST: 23 U/L (ref 11–38)
Alkaline Phosphatase: 110 U/L — ABNORMAL HIGH (ref 26–84)
BUN, Bld: 20 mg/dL (ref 7–22)
CALCIUM: 9 mg/dL (ref 8.0–10.3)
CHLORIDE: 103 meq/L (ref 98–108)
CO2: 29 meq/L (ref 18–33)
CREATININE: 0.9 mg/dL (ref 0.6–1.2)
Glucose, Bld: 129 mg/dL — ABNORMAL HIGH (ref 73–118)
Potassium: 3.7 mEq/L (ref 3.3–4.7)
Sodium: 144 mEq/L (ref 128–145)
TOTAL PROTEIN: 7.5 g/dL (ref 6.4–8.1)
Total Bilirubin: 0.8 mg/dl (ref 0.20–1.60)

## 2017-02-02 LAB — CBC WITH DIFFERENTIAL (CANCER CENTER ONLY)
BASO#: 0 10*3/uL (ref 0.0–0.2)
BASO%: 0.5 % (ref 0.0–2.0)
EOS%: 1.5 % (ref 0.0–7.0)
Eosinophils Absolute: 0.1 10*3/uL (ref 0.0–0.5)
HCT: 27.2 % — ABNORMAL LOW (ref 34.8–46.6)
HGB: 8.6 g/dL — ABNORMAL LOW (ref 11.6–15.9)
LYMPH#: 1.5 10*3/uL (ref 0.9–3.3)
LYMPH%: 19.3 % (ref 14.0–48.0)
MCH: 25.4 pg — ABNORMAL LOW (ref 26.0–34.0)
MCHC: 31.6 g/dL — AB (ref 32.0–36.0)
MCV: 81 fL (ref 81–101)
MONO#: 0.8 10*3/uL (ref 0.1–0.9)
MONO%: 11.2 % (ref 0.0–13.0)
NEUT#: 5.1 10*3/uL (ref 1.5–6.5)
NEUT%: 67.5 % (ref 39.6–80.0)
PLATELETS: 608 10*3/uL — AB (ref 145–400)
RBC: 3.38 10*6/uL — ABNORMAL LOW (ref 3.70–5.32)
RDW: 19.4 % — AB (ref 11.1–15.7)
WBC: 7.5 10*3/uL (ref 3.9–10.0)

## 2017-02-02 LAB — BASIC METABOLIC PANEL
BUN: 20 (ref 4–21)
Creatinine: 0.9 (ref 0.5–1.1)
Glucose: 129
Sodium: 144 (ref 137–147)

## 2017-02-02 LAB — HEPATIC FUNCTION PANEL
ALT: 26 (ref 7–35)
AST: 23 (ref 13–35)

## 2017-02-02 LAB — FERRITIN: FERRITIN: 238 ng/mL (ref 9–269)

## 2017-02-02 LAB — CHCC SATELLITE - SMEAR

## 2017-02-02 LAB — IRON AND TIBC
%SAT: 32 % (ref 21–57)
Iron: 70 ug/dL (ref 41–142)
TIBC: 221 ug/dL — AB (ref 236–444)
UIBC: 151 ug/dL (ref 120–384)

## 2017-02-02 LAB — LACTATE DEHYDROGENASE: LDH: 260 U/L — AB (ref 125–245)

## 2017-02-02 NOTE — Progress Notes (Signed)
Hematology and Oncology Follow Up Visit  EZRI FANGUY [id number] 1950/08/11 66 y.o. 02/02/2017   Principle Diagnosis:   Anagrelide 5 mg by mouth daily - dose changed today  Aspirin 81 mg by mouth daily  Folic acid 2 mg by mouth daily   Current Therapy:    Essential thrombocythemia-Calreticulin positive      Interim History:  Ms.  Snodgrass is back for followup. She is doing pretty well.  She has had no issues since we last saw her.  She had a very nice Thanksgiving.  She is going down to [place] for Christmas.  She has had no problems with fever.  She has had no cough or shortness of breath.  She has had no nausea or vomiting.  She has had no change in bowel or bladder habits.  She has had no rashes.  Is been no pain in her hands or feet.  Recheck her iron studies whenever we see her.  Back in October, her ferritin was 275 with an iron saturation of 35%.    She has had no headache.  There is been no leg swelling.  Overall, her performance status is ECOG 0.  Medications:  Current Outpatient Medications:  .  amLODipine (NORVASC) 10 MG tablet, Take 1 tablet (10 mg total) by mouth daily., Disp: 30 tablet, Rfl: 6 .  anagrelide (AGRYLIN) 1 MG capsule, TAKE 5 CAPSULES(5 MG) BY MOUTH DAILY, Disp: 150 capsule, Rfl: 0 .  aspirin 81 MG tablet, Take 81 mg by mouth daily., Disp: , Rfl:  .  BYSTOLIC 20 MG TABS, TAKE 1 TABLET BY MOUTH ONCE DAILY, Disp: 90 tablet, Rfl: 1 .  Fe Fum-FePoly-Vit C-Vit B3 (INTEGRA) 62.5-62.5-40-3 MG CAPS, Take 1 tablet by mouth daily., Disp: 30 capsule, Rfl: 8 .  folic acid (FOLVITE) 1 MG tablet, Take 2 tablets (2 mg total) by mouth daily. (Patient taking differently: Take 2 mg by mouth daily. 12.4.18-pt taking 1 mg daily), Disp: 180 tablet, Rfl: 3 .  glucose blood (ONE TOUCH ULTRA TEST) test strip, Use to test blood sugar 1 times daily. **PT NEEDS FOLLOW UP APPT FOR FURTHER REFILLS**, Disp: 100 each, Rfl: 0 .  lisinopril (PRINIVIL,ZESTRIL) 20 MG tablet,  Take 2 tablets (40 mg total) by mouth daily., Disp: 60 tablet, Rfl: 5 .  ONETOUCH DELICA LANCETS 40H MISC, Use to test blood sugar 1 time daily. **PT NEEDS FOLLOW UP APPT FOR FURTHER REFILLS**, Disp: 100 each, Rfl: 1 .  Vitamin D, Ergocalciferol, (DRISDOL) 50000 units CAPS capsule, TAKE 1 CAPSULE BY MOUTH 1 TIME A WEEK, Disp: 12 capsule, Rfl: 0  Allergies: No Known Allergies  Past Medical History, Surgical history, Social history, and Family History were reviewed and updated.  Review of Systems: As stated in the interim history.  Physical Exam:  weight is 155 lb 8 oz (70.5 kg). Her oral temperature is 97.9 F (36.6 C). Her blood pressure is 149/83 (abnormal) and her pulse is 80. Her respiration is 20 and oxygen saturation is 100%.   I examined Ms. Selena Batten.  The findings on my examination are noted below with appropriate changes:   Well-developed well-nourished African-American female in no obvious distress. Head and neck exam shows no ocular or oral lesions. There are no palpable cervical or supraclavicular lymph nodes. Lungs are clear bilaterally. Cardiac exam regular rate and rhythm with no murmurs, rubs or bruits. Abdomen is soft. She has good bowel sounds. There is no fluid wave. There is no palpable liver or spleen tip.  Back exam shows no tenderness over the spine, ribs or hips. Extremities shows no clubbing, cyanosis or edema. Neurological exam shows no focal neurological deficits. Skin exam shows no rashes, ecchymoses or petechia.   Lab Results  Component Value Date   WBC 7.5 02/02/2017   HGB 8.6 (L) 02/02/2017   HCT 27.2 (L) 02/02/2017   MCV 81 02/02/2017   PLT 608 (H) 02/02/2017     Chemistry      Component Value Date/Time   NA 144 02/02/2017 1158   NA 140 07/24/2016 1007   K 3.7 02/02/2017 1158   K 3.6 07/24/2016 1007   CL 103 02/02/2017 1158   CO2 29 02/02/2017 1158   CO2 26 07/24/2016 1007   BUN 20 02/02/2017 1158   BUN 30.3 (H) 07/24/2016 1007   CREATININE 0.9  02/02/2017 1158   CREATININE 1.1 07/24/2016 1007   GLU 127 12/17/2016      Component Value Date/Time   CALCIUM 9.0 02/02/2017 1158   CALCIUM 9.3 07/24/2016 1007   ALKPHOS 110 (H) 02/02/2017 1158   ALKPHOS 104 07/24/2016 1007   AST 23 02/02/2017 1158   AST 13 07/24/2016 1007   ALT 26 02/02/2017 1158   ALT 15 07/24/2016 1007   BILITOT 0.80 02/02/2017 1158   BILITOT 0.55 07/24/2016 1007      Impression and Plan: Ms. Rotunno is 66 year old [name]. She has essential thrombocythemia. She actually is Calreticulin positive.  I talked her length about having a bone marrow test done.  She really wants to hold off on doing this.  It is been over 4 years since she had one done.  I will see her back in 6 weeks.  We will see how everything looks with her blood counts.  I really believe that a bone marrow test is going to help [place] out.  Even though she is not JAK2 positive, I do think that she might be helped by [facility].    Marland Kitchen [name], MD 12/4/20181:14 PM

## 2017-02-08 ENCOUNTER — Encounter: Payer: Self-pay | Admitting: Internal Medicine

## 2017-02-19 ENCOUNTER — Other Ambulatory Visit: Payer: Self-pay | Admitting: Internal Medicine

## 2017-03-04 ENCOUNTER — Other Ambulatory Visit: Payer: Self-pay | Admitting: Hematology & Oncology

## 2017-03-04 DIAGNOSIS — D509 Iron deficiency anemia, unspecified: Secondary | ICD-10-CM

## 2017-03-04 DIAGNOSIS — D473 Essential (hemorrhagic) thrombocythemia: Secondary | ICD-10-CM

## 2017-03-10 ENCOUNTER — Telehealth: Payer: Self-pay | Admitting: Family Medicine

## 2017-03-10 NOTE — Telephone Encounter (Signed)
Copied from [place] [id number]. 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

## 2017-03-15 ENCOUNTER — Ambulatory Visit (INDEPENDENT_AMBULATORY_CARE_PROVIDER_SITE_OTHER): Payer: Medicare Other | Admitting: *Deleted

## 2017-03-15 DIAGNOSIS — Z111 Encounter for screening for respiratory tuberculosis: Secondary | ICD-10-CM | POA: Diagnosis not present

## 2017-03-15 NOTE — Telephone Encounter (Signed)
This is in your yellow folder for review and signature. Please advise Dr Regis Bill, thanks.

## 2017-03-15 NOTE — Progress Notes (Signed)
PPD Placement note Alexandra [name], 67 y.o. 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 Yes Is patient taking any oral or IV steroid medication now or have they taken it in the last month? no Has the patient been in recent contact with anyone known or suspected of having active TB disease?: no     O: Alert and oriented in NAD. P:  PPD placed on 03/15/2017.  Patient advised to return for reading within 48-72 hours.  Dorrene German, RN

## 2017-03-15 NOTE — Telephone Encounter (Signed)
Form dropped off at nurse visit. Completed as much as possible and forwarded to provider. Patient would like to pick up form Wednesday afternoon when she return for TB skin test reading if possible.

## 2017-03-16 NOTE — Telephone Encounter (Signed)
Form has been signed  And  On your desk

## 2017-03-16 NOTE — Telephone Encounter (Signed)
Lm x1 for patient.  

## 2017-03-17 LAB — TB SKIN TEST
Induration: 0 mm
TB Skin Test: NEGATIVE

## 2017-03-18 ENCOUNTER — Inpatient Hospital Stay: Payer: Medicare Other

## 2017-03-18 ENCOUNTER — Inpatient Hospital Stay: Payer: Medicare Other | Attending: Hematology & Oncology | Admitting: Hematology & Oncology

## 2017-03-18 VITALS — BP 141/81 | HR 89 | Temp 97.9°F | Resp 17 | Wt 157.5 lb

## 2017-03-18 DIAGNOSIS — D473 Essential (hemorrhagic) thrombocythemia: Secondary | ICD-10-CM | POA: Diagnosis not present

## 2017-03-18 DIAGNOSIS — D500 Iron deficiency anemia secondary to blood loss (chronic): Secondary | ICD-10-CM

## 2017-03-18 LAB — CMP (CANCER CENTER ONLY)
ALK PHOS: 110 U/L (ref 40–150)
ALT: 18 U/L (ref 0–55)
AST: 19 U/L (ref 5–34)
Albumin: 3.9 g/dL (ref 3.5–5.0)
Anion gap: 8 (ref 3–11)
BILIRUBIN TOTAL: 0.5 mg/dL (ref 0.2–1.2)
BUN: 32 mg/dL — AB (ref 7–26)
CALCIUM: 9.5 mg/dL (ref 8.4–10.4)
CO2: 26 mmol/L (ref 22–29)
CREATININE: 1.11 mg/dL — AB (ref 0.60–1.10)
Chloride: 104 mmol/L (ref 98–109)
GFR, EST AFRICAN AMERICAN: 59 mL/min — AB (ref >60)
GFR, EST NON AFRICAN AMERICAN: 51 mL/min — AB (ref >60)
Glucose, Bld: 124 mg/dL (ref 70–140)
Potassium: 3.7 mmol/L (ref 3.3–4.7)
Sodium: 138 mmol/L (ref 136–145)
TOTAL PROTEIN: 8 g/dL (ref 6.4–8.3)

## 2017-03-18 LAB — CBC WITH DIFFERENTIAL (CANCER CENTER ONLY)
Basophils Absolute: 0 10*3/uL (ref 0.0–0.1)
Basophils Relative: 1 %
EOS PCT: 2 %
Eosinophils Absolute: 0.1 10*3/uL (ref 0.0–0.5)
HEMATOCRIT: 29.1 % — AB (ref 34.8–46.6)
Hemoglobin: 9.2 g/dL — ABNORMAL LOW (ref 11.6–15.9)
LYMPHS PCT: 18 %
Lymphs Abs: 1.3 10*3/uL (ref 0.9–3.3)
MCH: 25.8 pg — AB (ref 26.0–34.0)
MCHC: 31.6 g/dL — ABNORMAL LOW (ref 32.0–36.0)
MCV: 81.5 fL (ref 81.0–101.0)
MONO ABS: 0.7 10*3/uL (ref 0.1–0.9)
Monocytes Relative: 10 %
Neutro Abs: 5.2 10*3/uL (ref 1.5–6.5)
Neutrophils Relative %: 69 %
PLATELETS: 629 10*3/uL — AB (ref 145–400)
RBC: 3.57 MIL/uL — ABNORMAL LOW (ref 3.70–5.32)
RDW: 19.3 % — AB (ref 11.1–15.7)
WBC Count: 7.5 10*3/uL (ref 3.9–10.3)

## 2017-03-18 LAB — IRON AND TIBC
IRON: 92 ug/dL (ref 41–142)
Saturation Ratios: 40 % (ref 21–57)
TIBC: 228 ug/dL — ABNORMAL LOW (ref 236–444)
UIBC: 136 ug/dL

## 2017-03-18 LAB — FERRITIN: Ferritin: 251 ng/mL (ref 9–269)

## 2017-03-18 LAB — LACTATE DEHYDROGENASE: LDH: 273 U/L — AB (ref 125–245)

## 2017-03-18 NOTE — Progress Notes (Signed)
Hematology and Oncology Follow Up Visit  Alexandra [name] [id number] [birth year] 67 y.o. 03/18/2017   Principle Diagnosis:   Anagrelide 5 mg by mouth daily - dose changed today  Aspirin 81 mg by mouth daily  Folic acid 2 mg by mouth daily   Current Therapy:    Essential thrombocythemia-Calreticulin positive      Interim History:  [name] is back for followup. She is doing pretty well.  She did have a nice Christmas.  She did go down to [place] where her family is from.  She does have a part-time job.  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  She really is looking forward to this.  She has had no problems with cough or shortness of breath.  She has had no bleeding.  She has had no fever.  She is had no nausea or vomiting.  We did increase her dose of anagrelide last time she was here, back in early December.  She is tolerated this well.  Overall, her performance status is ECOG 0.  Medications:  Current Outpatient Medications:  .  amLODipine (NORVASC) 10 MG tablet, Take 1 tablet (10 mg total) by mouth daily., Disp: 30 tablet, Rfl: 6 .  anagrelide (AGRYLIN) 1 MG capsule, TAKE 5 CAPSULES(5 MG) BY MOUTH DAILY, Disp: 150 capsule, Rfl: 0 .  aspirin 81 MG tablet, Take 81 mg by mouth daily., Disp: , Rfl:  .  BYSTOLIC 20 MG TABS, TAKE 1 TABLET BY MOUTH ONCE DAILY, Disp: 90 tablet, Rfl: 0 .  Fe Fum-FePoly-Vit C-Vit B3 (INTEGRA) 62.5-62.5-40-3 MG CAPS, Take 1 tablet by mouth daily., Disp: 30 capsule, Rfl: 8 .  folic acid (FOLVITE) 1 MG tablet, Take 2 tablets (2 mg total) by mouth daily. (Patient taking differently: Take 2 mg by mouth daily. 12.4.18-pt taking 1 mg daily), Disp: 180 tablet, Rfl: 3 .  glucose blood (ONE TOUCH ULTRA TEST) test strip, Use to test blood sugar 1 times daily. **PT NEEDS FOLLOW UP APPT FOR FURTHER REFILLS**, Disp: 100 each, Rfl: 0 .  lisinopril (PRINIVIL,ZESTRIL) 20 MG tablet, Take 2 tablets (40 mg total) by mouth daily., Disp: 60  tablet, Rfl: 5 .  ONETOUCH DELICA LANCETS 27O MISC, Use to test blood sugar 1 time daily. **PT NEEDS FOLLOW UP APPT FOR FURTHER REFILLS**, Disp: 100 each, Rfl: 1 .  Vitamin D, Ergocalciferol, (DRISDOL) 50000 units CAPS capsule, TAKE 1 CAPSULE BY MOUTH 1 TIME A WEEK, Disp: 12 capsule, Rfl: 0  Allergies: No Known Allergies  Past Medical History, Surgical history, Social history, and Family History were reviewed and updated.  Review of Systems: Review of Systems  All other systems reviewed and are negative. Alexandra [name]  Physical Exam:  weight is 157 lb 8 oz (71.4 kg). Her oral temperature is 97.9 F (36.6 C). Her blood pressure is 141/81 (abnormal) and her pulse is 89. Her respiration is 17 and oxygen saturation is 100%.   Physical Exam  Constitutional: She is oriented to person, place, and time.  HENT:  Head: Normocephalic and atraumatic.  Mouth/Throat: Oropharynx is clear and moist.  Eyes: EOM are normal. Pupils are equal, round, and reactive to light.  Neck: Normal range of motion.  Cardiovascular: Normal rate, regular rhythm and normal heart sounds.  Pulmonary/Chest: Effort normal and breath sounds normal.  Abdominal: Soft. Bowel sounds are normal.  Musculoskeletal: Normal range of motion. She exhibits no edema, tenderness or deformity.  Lymphadenopathy:    She has no cervical  adenopathy.  Neurological: She is alert and oriented to person, place, and time.  Skin: Skin is warm and dry. No rash noted. No erythema.  Psychiatric: She has a normal mood and affect. Her behavior is normal. Judgment and thought content normal.  Vitals reviewed.    Lab Results  Component Value Date   WBC 7.5 03/18/2017   HGB 8.6 (L) 02/02/2017   HCT 29.1 (L) 03/18/2017   MCV 81.5 03/18/2017   PLT 629 (H) 03/18/2017     Chemistry      Component Value Date/Time   NA 144 02/02/2017 1158   NA 140 07/24/2016 1007   K 3.7 02/02/2017 1158   K 3.6 07/24/2016 1007   CL 103 02/02/2017 1158   CO2 29  02/02/2017 1158   CO2 26 07/24/2016 1007   BUN 20 02/02/2017 1158   BUN 30.3 (H) 07/24/2016 1007   CREATININE 0.9 02/02/2017 1158   CREATININE 1.1 07/24/2016 1007   GLU 129 02/02/2017      Component Value Date/Time   CALCIUM 9.0 02/02/2017 1158   CALCIUM 9.3 07/24/2016 1007   ALKPHOS 110 (H) 02/02/2017 1158   ALKPHOS 104 07/24/2016 1007   AST 23 02/02/2017 1158   AST 13 07/24/2016 1007   ALT 26 02/02/2017 1158   ALT 15 07/24/2016 1007   BILITOT 0.80 02/02/2017 1158   BILITOT 0.55 07/24/2016 1007      Impression and Plan: Alexandra [name] is 67 year old [name]. She has essential thrombocythemia. She actually is Calreticulin positive.  Overall, her blood counts to look relatively stable.  As such, I think we can hold off on any change or interventions.  I think we can get her back in a couple months.  I think this would be reasonable.  She is quite busy with her part-time job.  I really do not want to interfere with this.  [name], MD 1/17/201912:37 PM

## 2017-03-19 NOTE — Telephone Encounter (Signed)
Pt picked up form 2 days ago. Nothing further needed.

## 2017-03-26 ENCOUNTER — Other Ambulatory Visit: Payer: Self-pay | Admitting: Family

## 2017-04-02 ENCOUNTER — Other Ambulatory Visit: Payer: Self-pay | Admitting: Hematology & Oncology

## 2017-04-02 DIAGNOSIS — D509 Iron deficiency anemia, unspecified: Secondary | ICD-10-CM

## 2017-04-02 DIAGNOSIS — D473 Essential (hemorrhagic) thrombocythemia: Secondary | ICD-10-CM

## 2017-05-04 ENCOUNTER — Other Ambulatory Visit: Payer: Self-pay | Admitting: Hematology & Oncology

## 2017-05-04 DIAGNOSIS — D509 Iron deficiency anemia, unspecified: Secondary | ICD-10-CM

## 2017-05-04 DIAGNOSIS — D473 Essential (hemorrhagic) thrombocythemia: Secondary | ICD-10-CM

## 2017-05-25 ENCOUNTER — Inpatient Hospital Stay: Payer: Medicare Other

## 2017-05-25 ENCOUNTER — Inpatient Hospital Stay: Payer: Medicare Other | Admitting: Hematology & Oncology

## 2017-05-27 ENCOUNTER — Other Ambulatory Visit: Payer: Self-pay | Admitting: Internal Medicine

## 2017-05-31 ENCOUNTER — Encounter: Payer: Self-pay | Admitting: Hematology & Oncology

## 2017-05-31 ENCOUNTER — Other Ambulatory Visit: Payer: Self-pay

## 2017-05-31 ENCOUNTER — Inpatient Hospital Stay (HOSPITAL_BASED_OUTPATIENT_CLINIC_OR_DEPARTMENT_OTHER): Payer: Medicare Other | Admitting: Hematology & Oncology

## 2017-05-31 ENCOUNTER — Inpatient Hospital Stay: Payer: Medicare Other | Attending: Hematology & Oncology

## 2017-05-31 VITALS — BP 145/85 | HR 82 | Temp 98.2°F | Resp 18 | Wt 152.0 lb

## 2017-05-31 DIAGNOSIS — D473 Essential (hemorrhagic) thrombocythemia: Secondary | ICD-10-CM | POA: Diagnosis present

## 2017-05-31 LAB — CBC WITH DIFFERENTIAL (CANCER CENTER ONLY)
BASOS ABS: 0 10*3/uL (ref 0.0–0.1)
BASOS PCT: 1 %
Eosinophils Absolute: 0.1 10*3/uL (ref 0.0–0.5)
Eosinophils Relative: 2 %
HEMATOCRIT: 27.8 % — AB (ref 34.8–46.6)
Hemoglobin: 8.6 g/dL — ABNORMAL LOW (ref 11.6–15.9)
Lymphocytes Relative: 29 %
Lymphs Abs: 1.8 10*3/uL (ref 0.9–3.3)
MCH: 25 pg — ABNORMAL LOW (ref 26.0–34.0)
MCHC: 30.9 g/dL — ABNORMAL LOW (ref 32.0–36.0)
MCV: 80.8 fL — ABNORMAL LOW (ref 81.0–101.0)
MONO ABS: 0.8 10*3/uL (ref 0.1–0.9)
Monocytes Relative: 12 %
NEUTROS ABS: 3.6 10*3/uL (ref 1.5–6.5)
NEUTROS PCT: 56 %
Platelet Count: 513 10*3/uL — ABNORMAL HIGH (ref 145–400)
RBC: 3.44 MIL/uL — ABNORMAL LOW (ref 3.70–5.32)
RDW: 18.5 % — AB (ref 11.1–15.7)
WBC: 6.4 10*3/uL (ref 3.9–10.0)

## 2017-05-31 LAB — LACTATE DEHYDROGENASE: LDH: 245 U/L (ref 125–245)

## 2017-05-31 LAB — IRON AND TIBC
Iron: 62 ug/dL (ref 41–142)
Saturation Ratios: 29 % (ref 21–57)
TIBC: 210 ug/dL — ABNORMAL LOW (ref 236–444)
UIBC: 148 ug/dL

## 2017-05-31 LAB — CMP (CANCER CENTER ONLY)
ALBUMIN: 3.6 g/dL (ref 3.5–5.0)
ALK PHOS: 96 U/L — AB (ref 26–84)
ALT: 23 U/L (ref 10–47)
AST: 25 U/L (ref 11–38)
Anion gap: 6 (ref 5–15)
BILIRUBIN TOTAL: 0.8 mg/dL (ref 0.2–1.6)
BUN: 18 mg/dL (ref 7–22)
CO2: 32 mmol/L (ref 18–33)
CREATININE: 0.9 mg/dL (ref 0.60–1.20)
Calcium: 9.1 mg/dL (ref 8.0–10.3)
Chloride: 104 mmol/L (ref 98–108)
Glucose, Bld: 121 mg/dL — ABNORMAL HIGH (ref 73–118)
Potassium: 3.4 mmol/L (ref 3.3–4.7)
SODIUM: 142 mmol/L (ref 128–145)
Total Protein: 7.4 g/dL (ref 6.4–8.1)

## 2017-05-31 LAB — FERRITIN: FERRITIN: 223 ng/mL (ref 9–269)

## 2017-05-31 NOTE — Progress Notes (Signed)
Hematology and Oncology Follow Up Visit  Alexandra [name] [id number] September 03, 1950 67 y.o. 05/31/2017   Principle Diagnosis:   Anagrelide 5 mg by mouth daily - dose changed today  Aspirin 81 mg by mouth daily  Folic acid 2 mg by mouth daily   Current Therapy:    Essential thrombocythemia-Calreticulin positive      Interim History:  Alexandra [name] is back for followup.  So far, everything is going quite well for her.  She is getting ready for some of her family to visit this week.  She has had no issues with nausea or vomiting.  She is had no fatigue.  She has had no cough or shortness of breath.  She is trying to exercise.  She has eaten pretty well.  She has had no nausea or vomiting.  Her iron studies that we checked back in January showed a ferritin of 251 with an iron saturation of 40%.  We did increase her dose of anagrelide.  She is tolerated this nicely.  Overall, her performance status is ECOG 0.  Medications:  Current Outpatient Medications:  .  amLODipine (NORVASC) 10 MG tablet, Take 1 tablet (10 mg total) by mouth daily., Disp: 30 tablet, Rfl: 6 .  anagrelide (AGRYLIN) 1 MG capsule, TAKE 5 CAPSULES(5 MG) BY MOUTH DAILY, Disp: 150 capsule, Rfl: 0 .  aspirin 81 MG tablet, Take 81 mg by mouth daily., Disp: , Rfl:  .  BYSTOLIC 20 MG TABS, TAKE 1 TABLET BY MOUTH ONCE DAILY, Disp: 90 tablet, Rfl: 0 .  Fe Fum-FePoly-Vit C-Vit B3 (INTEGRA) 62.5-62.5-40-3 MG CAPS, Take 1 tablet by mouth daily., Disp: 30 capsule, Rfl: 8 .  folic acid (FOLVITE) 1 MG tablet, Take 2 tablets (2 mg total) by mouth daily. (Patient taking differently: Take 2 mg by mouth daily. 12.4.18-pt taking 1 mg daily), Disp: 180 tablet, Rfl: 3 .  glucose blood (ONE TOUCH ULTRA TEST) test strip, Use to test blood sugar 1 times daily. **PT NEEDS FOLLOW UP APPT FOR FURTHER REFILLS**, Disp: 100 each, Rfl: 0 .  lisinopril (PRINIVIL,ZESTRIL) 20 MG tablet, Take 2 tablets (40 mg total) by mouth daily., Disp: 60 tablet, Rfl:  5 .  ONETOUCH DELICA LANCETS 25Z MISC, Use to test blood sugar 1 time daily. **PT NEEDS FOLLOW UP APPT FOR FURTHER REFILLS**, Disp: 100 each, Rfl: 1 .  Vitamin D, Ergocalciferol, (DRISDOL) 50000 units CAPS capsule, TAKE 1 CAPSULE BY MOUTH 1 TIME A WEEK, Disp: 12 capsule, Rfl: 0  Allergies: No Known Allergies  Past Medical History, Surgical history, Social history, and Family History were reviewed and updated.  Review of Systems: Review of Systems  All other systems reviewed and are negative. Marland Kitchen  Physical Exam:  weight is 152 lb (68.9 kg). Her oral temperature is 98.2 F (36.8 C). Her blood pressure is 145/85 (abnormal) and her pulse is 82. Her respiration is 18 and oxygen saturation is 100%.   Physical Exam  Constitutional: She is oriented to person, place, and time.  HENT:  Head: Normocephalic and atraumatic.  Mouth/Throat: Oropharynx is clear and moist.  Eyes: Pupils are equal, round, and reactive to light. EOM are normal.  Neck: Normal range of motion.  Cardiovascular: Normal rate, regular rhythm and normal heart sounds.  Pulmonary/Chest: Effort normal and breath sounds normal.  Abdominal: Soft. Bowel sounds are normal.  Musculoskeletal: Normal range of motion. She exhibits no edema, tenderness or deformity.  Lymphadenopathy:    She has no cervical adenopathy.  Neurological: She is  alert and oriented to person, place, and time.  Skin: Skin is warm and dry. No rash noted. No erythema.  Psychiatric: She has a normal mood and affect. Her behavior is normal. Judgment and thought content normal.  Vitals reviewed.    Lab Results  Component Value Date   WBC 6.4 05/31/2017   HGB 8.6 (L) 02/02/2017   HCT 27.8 (L) 05/31/2017   MCV 80.8 (L) 05/31/2017   PLT 513 (H) 05/31/2017     Chemistry      Component Value Date/Time   NA 142 05/31/2017 0940   NA 144 02/02/2017 1158   NA 140 07/24/2016 1007   K 3.4 05/31/2017 0940   K 3.7 02/02/2017 1158   K 3.6 07/24/2016 1007   CL  104 05/31/2017 0940   CL 103 02/02/2017 1158   CO2 32 05/31/2017 0940   CO2 29 02/02/2017 1158   CO2 26 07/24/2016 1007   BUN 18 05/31/2017 0940   BUN 20 02/02/2017 1158   BUN 30.3 (H) 07/24/2016 1007   CREATININE 0.90 05/31/2017 0940   CREATININE 0.9 02/02/2017 1158   CREATININE 1.1 07/24/2016 1007   GLU 129 02/02/2017      Component Value Date/Time   CALCIUM 9.1 05/31/2017 0940   CALCIUM 9.0 02/02/2017 1158   CALCIUM 9.3 07/24/2016 1007   ALKPHOS 96 (H) 05/31/2017 0940   ALKPHOS 110 (H) 02/02/2017 1158   ALKPHOS 104 07/24/2016 1007   AST 25 05/31/2017 0940   AST 13 07/24/2016 1007   ALT 23 05/31/2017 0940   ALT 26 02/02/2017 1158   ALT 15 07/24/2016 1007   BILITOT 0.8 05/31/2017 0940   BILITOT 0.55 07/24/2016 1007      Impression and Plan: Alexandra [name] is 67 year old [name]. She has essential thrombocythemia. She actually is Calreticulin positive.  Overall, her blood counts to look relatively stable.  As such, I think we can hold off on any change or interventions.  I think we can get her back in a 3 months.  I think this would be reasonable.  She is quite busy with her part-time job.  I really do not want to interfere with this.  [name], MD 4/1/201911:06 AM

## 2017-06-03 ENCOUNTER — Other Ambulatory Visit: Payer: Self-pay | Admitting: Hematology & Oncology

## 2017-06-03 DIAGNOSIS — D509 Iron deficiency anemia, unspecified: Secondary | ICD-10-CM

## 2017-06-03 DIAGNOSIS — D473 Essential (hemorrhagic) thrombocythemia: Secondary | ICD-10-CM

## 2017-06-14 ENCOUNTER — Other Ambulatory Visit: Payer: Self-pay | Admitting: Internal Medicine

## 2017-06-22 ENCOUNTER — Other Ambulatory Visit: Payer: Self-pay | Admitting: Internal Medicine

## 2017-06-22 DIAGNOSIS — Z139 Encounter for screening, unspecified: Secondary | ICD-10-CM

## 2017-06-28 ENCOUNTER — Other Ambulatory Visit: Payer: Self-pay | Admitting: Hematology & Oncology

## 2017-06-28 ENCOUNTER — Other Ambulatory Visit: Payer: Self-pay | Admitting: Family

## 2017-06-28 DIAGNOSIS — D473 Essential (hemorrhagic) thrombocythemia: Secondary | ICD-10-CM

## 2017-06-28 DIAGNOSIS — D509 Iron deficiency anemia, unspecified: Secondary | ICD-10-CM

## 2017-07-01 ENCOUNTER — Other Ambulatory Visit: Payer: Self-pay | Admitting: *Deleted

## 2017-07-01 DIAGNOSIS — D508 Other iron deficiency anemias: Secondary | ICD-10-CM

## 2017-07-01 DIAGNOSIS — D473 Essential (hemorrhagic) thrombocythemia: Secondary | ICD-10-CM

## 2017-07-01 MED ORDER — INTEGRA 62.5-62.5-40-3 MG PO CAPS: 1.0000 | ORAL_CAPSULE | Freq: Every day | ORAL | 8 refills | Status: DC

## 2017-07-16 NOTE — Progress Notes (Signed)
Chief Complaint  Patient presents with  . Medication Refill    No new concerns    HPI: Alexandra [name] 67 y.o. comes in today for fu     .Since last visit. 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  [employer]   Active walking  No new sx  Only  Specialist dr Marin Olp.  No cp sob  BP the same almost out of  meds   Blood tests done  Per dr E in past   Health Maintenance  Topic Date Due  . Hepatitis C Screening  January 19, 1951  . OPHTHALMOLOGY EXAM  12/26/2013  . PNA vac Low Risk Adult (1 of 2 - PCV13) 05/08/2015  . FOOT EXAM  04/30/2016  . HEMOGLOBIN A1C  11/14/2016  . INFLUENZA VACCINE  09/30/2017  . MAMMOGRAM  07/21/2018  . COLONOSCOPY  05/02/2019  . TETANUS/TDAP  02/26/2021  . DEXA SCAN  Completed     ROS:  No cp sob syncope bleeding  Or   Unusual numbness vision change  Past Medical History:  Diagnosis Date  . Diabetes mellitus without complication ([facility])   . Hypertension    echo   . Iron deficiency anemia, unspecified 03/15/2013  . Thrombocytosis ([facility])     Family History  Problem Relation Age of Onset  . Heart disease Mother        died age 71  . Kidney disease Mother        family hx  . Heart attack Father 50       massive  . Hypertension Sister   . Hypertension Sister   . Hypertension Sister   . Hypertension Sister   . Arthritis Unknown        family hx  . Diabetes Unknown        family hx  . Stroke Unknown         dialysis    Social History   Socioeconomic History  . Marital status: Married    Spouse name: Not on file  . Number of children: Not on file  . Years of education: Not on file  . Highest education level: Not on file  Occupational History  . Not on file  Social Needs  . Financial resource strain: Not on file  . Food insecurity:    Worry: Not on file    Inability: Not on file  . Transportation needs:    Medical: Not on file    Non-medical: Not on file  Tobacco Use  .  Smoking status: Never Smoker  . Smokeless tobacco: Never Used  . Tobacco comment: never used tobacco  Substance and Sexual Activity  . Alcohol use: No    Alcohol/week: 0.0 oz  . Drug use: No  . Sexual activity: Not on file  Lifestyle  . Physical activity:    Days per week: Not on file    Minutes per session: Not on file  . Stress: Not on file  Relationships  . Social connections:    Talks on phone: Not on file    Gets together: Not on file    Attends religious service: Not on file    Active member of club or organization: Not on file    Attends meetings of clubs or organizations: Not on file    Relationship status: Not on file  Other Topics Concern  . 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   No tobacco no ethoh little caffiene .     Outpatient Encounter Medications as of 07/19/2017  Medication Sig  . amLODipine (NORVASC) 10 MG tablet Take 1 tablet (10 mg total) by mouth daily.  Marland Kitchen anagrelide (AGRYLIN) 1 MG capsule TAKE 5 CAPSULES(5 MG) BY MOUTH DAILY  . aspirin 81 MG tablet Take 81 mg by mouth daily.  . Fe Fum-FePoly-Vit C-Vit B3 (INTEGRA) 62.5-62.5-40-3 MG CAPS Take 1 tablet by mouth daily.  . folic acid (FOLVITE) 1 MG tablet Take 2 tablets (2 mg total) by mouth daily. (Patient taking differently: Take 2 mg by mouth daily. 12.4.18-pt taking 1 mg daily)  . glucose blood (ONE TOUCH ULTRA TEST) test strip Use to test blood sugar 1 times daily. **PT NEEDS FOLLOW UP APPT FOR FURTHER REFILLS**  . lisinopril (PRINIVIL,ZESTRIL) 20 MG tablet TAKE 2 TABLETS BY MOUTH ONCE DAILY  . Nebivolol HCl (BYSTOLIC) 20 MG TABS Take 1 tablet (20 mg total) by mouth daily.  Glory Rosebush DELICA LANCETS 22G MISC Use to test blood sugar 1 time daily. **PT NEEDS FOLLOW UP APPT FOR FURTHER REFILLS**  . Vitamin D, Ergocalciferol, (DRISDOL) 50000 units CAPS capsule TAKE 1 CAPSULE BY MOUTH 1 TIME A WEEK  . [DISCONTINUED] amLODipine  (NORVASC) 10 MG tablet Take 1 tablet (10 mg total) by mouth daily.  . [DISCONTINUED] amLODipine (NORVASC) 10 MG tablet Take 1 tablet (10 mg total) by mouth daily.  . [DISCONTINUED] BYSTOLIC 20 MG TABS TAKE 1 TABLET BY MOUTH ONCE DAILY  . [DISCONTINUED] lisinopril (PRINIVIL,ZESTRIL) 20 MG tablet TAKE 2 TABLETS BY MOUTH ONCE DAILY **PATIENT  NEEDS  TO  SCHEDULE  AN  APPOINTMENT  FOR  FURTHER  REFILLS**   No facility-administered encounter medications on file as of 07/19/2017.     EXAM:  BP 138/80 (BP Location: Right Arm, Patient Position: Sitting, Cuff Size: Normal)   Pulse 85   Temp 97.9 F (36.6 C) (Oral)   Wt 150 lb 9.6 oz (68.3 kg)   BMI 21.00 kg/m   Body mass index is 21 kg/m.  Physical Exam: Vital signs reviewed URK:YHCW is a well-developed well-nourished alert cooperative   who appears stated age in no acute distress.  HEENT: normocephalic atraumatic NECK: supple without masses, thyromegaly or bruits. CHEST/PULM:  Clear to auscultation and percussion breath sounds equal no wheeze , rales or rhonchi. No chest wall deformities or tenderness. CV: PMI is nondisplaced, S1 S2 no gallops, murmurs, rubs. Peripheral pulses are full without delay.No JVD .  ABDOMEN: Bowel sounds normal nontender  No guard or rebound, no hepato splenomegal no CVA tenderness.   Extremtities:  No clubbing cyanosis slight  edema, no acute joint swelling or redness no focal atrophy NEURO:  Oriented x3, cranial nerves 3-12 appear to be intact, no obvious focal weakness,gait within normal limits no abnormal reflexes or asymmetrical SKIN: No acute rashes normal turgor, color, no bruising or petechiae. PSYCH: Oriented, good eye contact, no obvious depression anxiety, LN: no cervical adenopathy   Lab Results  Component Value Date   WBC 6.4 05/31/2017   HGB 8.6 (L) 05/31/2017   HCT 27.8 (L) 05/31/2017   PLT 513 (H) 05/31/2017   GLUCOSE 121 (H) 05/31/2017   CHOL 227 (H) 11/17/2016   TRIG 62.0 11/17/2016   HDL  71.90 11/17/2016   LDLDIRECT 127.3 04/19/2012  LDLCALC 143 (H) 11/17/2016   ALT 23 05/31/2017   AST 25 05/31/2017   NA 142 05/31/2017   K 3.4 05/31/2017   CL 104 05/31/2017   CREATININE 0.90 05/31/2017   BUN 18 05/31/2017   CO2 32 05/31/2017   TSH 1.62 05/14/2016   HGBA1C 5.9 07/19/2017   MICROALBUR 1.8 05/14/2016   Wt Readings from Last 3 Encounters:  07/19/17 150 lb 9.6 oz (68.3 kg)  05/31/17 152 lb (68.9 kg)  03/18/17 157 lb 8 oz (71.4 kg)   BP Readings from Last 3 Encounters:  07/19/17 138/80  05/31/17 (!) 145/85  03/18/17 (!) 141/81     ASSESSMENT AND PLAN:  Discussed the following assessment and plan:  Essential hypertension  Medication management - Plan: POC HgB A1c  Controlled type 2 diabetes mellitus without complication, without long-term current use of insulin (HCC) - improved  - Plan: POC HgB A1c Doingbetter   Since retirement and a1c is excellent   Due for  Lipid panel  In the next few months  Will ask if Dr en never could do this if possible and change cpx to august  Patient Care Team: Burnis Medin, MD as PCP - General Marin Olp Rudell Cobb, MD as Attending Physician (Hematology and Oncology) Philemon Kingdom, MD as Consulting Physician (Internal Medicine)  Patient Instructions   Continue lifestyle intervention healthy eating and exercise .  Will send a note to dr Dorathy Kinsman he is able to do lipid panel at your next  Lab draw .   If not no worreis  Can change cpx appt to [place]. Panosh M.D.

## 2017-07-19 ENCOUNTER — Other Ambulatory Visit: Payer: Self-pay | Admitting: Internal Medicine

## 2017-07-19 ENCOUNTER — Encounter: Payer: Self-pay | Admitting: Internal Medicine

## 2017-07-19 ENCOUNTER — Other Ambulatory Visit: Payer: Self-pay | Admitting: Hematology & Oncology

## 2017-07-19 ENCOUNTER — Ambulatory Visit (INDEPENDENT_AMBULATORY_CARE_PROVIDER_SITE_OTHER): Payer: Medicare Other | Admitting: Internal Medicine

## 2017-07-19 VITALS — BP 138/80 | HR 85 | Temp 97.9°F | Wt 150.6 lb

## 2017-07-19 DIAGNOSIS — Z79899 Other long term (current) drug therapy: Secondary | ICD-10-CM

## 2017-07-19 DIAGNOSIS — E119 Type 2 diabetes mellitus without complications: Secondary | ICD-10-CM | POA: Diagnosis not present

## 2017-07-19 DIAGNOSIS — I10 Essential (primary) hypertension: Secondary | ICD-10-CM | POA: Diagnosis not present

## 2017-07-19 DIAGNOSIS — D473 Essential (hemorrhagic) thrombocythemia: Secondary | ICD-10-CM

## 2017-07-19 DIAGNOSIS — D508 Other iron deficiency anemias: Secondary | ICD-10-CM

## 2017-07-19 LAB — POCT GLYCOSYLATED HEMOGLOBIN (HGB A1C): HEMOGLOBIN A1C: 5.9

## 2017-07-19 MED ORDER — LISINOPRIL 20 MG PO TABS: ORAL_TABLET | ORAL | 1 refills | Status: DC

## 2017-07-19 MED ORDER — AMLODIPINE BESYLATE 10 MG PO TABS: 10.0000 mg | ORAL_TABLET | Freq: Every day | ORAL | 1 refills | Status: DC

## 2017-07-19 MED ORDER — NEBIVOLOL HCL 20 MG PO TABS: 1.0000 | ORAL_TABLET | Freq: Every day | ORAL | 1 refills | Status: DC

## 2017-07-19 NOTE — Patient Instructions (Addendum)
  Continue lifestyle intervention healthy eating and exercise .  Will send a note to dr Dorathy Kinsman he is able to do lipid panel at your next  Lab draw .   If not no worreis  Can change cpx appt to [facility]

## 2017-07-21 ENCOUNTER — Ambulatory Visit
Admission: RE | Admit: 2017-07-21 | Discharge: 2017-07-21 | Disposition: A | Discharge status: Home / Self Care | Payer: Medicare Other | Source: Ambulatory Visit | Attending: Internal Medicine | Admitting: Internal Medicine

## 2017-07-21 DIAGNOSIS — Z139 Encounter for screening, unspecified: Secondary | ICD-10-CM

## 2017-08-03 ENCOUNTER — Other Ambulatory Visit: Payer: Self-pay

## 2017-08-03 ENCOUNTER — Inpatient Hospital Stay: Payer: Medicare Other

## 2017-08-03 ENCOUNTER — Inpatient Hospital Stay: Payer: Medicare Other | Attending: Hematology & Oncology | Admitting: Hematology & Oncology

## 2017-08-03 VITALS — BP 134/73 | HR 80 | Temp 97.8°F | Resp 17 | Wt 148.1 lb

## 2017-08-03 DIAGNOSIS — D500 Iron deficiency anemia secondary to blood loss (chronic): Secondary | ICD-10-CM

## 2017-08-03 DIAGNOSIS — D473 Essential (hemorrhagic) thrombocythemia: Secondary | ICD-10-CM | POA: Insufficient documentation

## 2017-08-03 DIAGNOSIS — D509 Iron deficiency anemia, unspecified: Secondary | ICD-10-CM

## 2017-08-03 LAB — CBC WITH DIFFERENTIAL (CANCER CENTER ONLY)
Basophils Absolute: 0.1 10*3/uL (ref 0.0–0.1)
Basophils Relative: 1 %
Eosinophils Absolute: 0.2 10*3/uL (ref 0.0–0.5)
Eosinophils Relative: 2 %
HEMATOCRIT: 27 % — AB (ref 34.8–46.6)
Hemoglobin: 8.6 g/dL — ABNORMAL LOW (ref 11.6–15.9)
LYMPHS ABS: 1.7 10*3/uL (ref 0.9–3.3)
LYMPHS PCT: 22 %
MCH: 25.4 pg — AB (ref 26.0–34.0)
MCHC: 31.9 g/dL — AB (ref 32.0–36.0)
MCV: 79.9 fL — ABNORMAL LOW (ref 81.0–101.0)
MONOS PCT: 11 %
Monocytes Absolute: 0.9 10*3/uL (ref 0.1–0.9)
NEUTROS ABS: 4.9 10*3/uL (ref 1.5–6.5)
Neutrophils Relative %: 64 %
Platelet Count: 575 10*3/uL — ABNORMAL HIGH (ref 145–400)
RBC: 3.38 MIL/uL — ABNORMAL LOW (ref 3.70–5.32)
RDW: 19.5 % — ABNORMAL HIGH (ref 11.1–15.7)
WBC Count: 7.8 10*3/uL (ref 3.9–10.0)

## 2017-08-03 LAB — IRON AND TIBC
IRON: 58 ug/dL (ref 41–142)
Saturation Ratios: 27 % (ref 21–57)
TIBC: 215 ug/dL — ABNORMAL LOW (ref 236–444)
UIBC: 157 ug/dL

## 2017-08-03 LAB — CMP (CANCER CENTER ONLY)
ALK PHOS: 93 U/L — AB (ref 26–84)
ALT: 21 U/L (ref 10–47)
AST: 23 U/L (ref 11–38)
Albumin: 3.7 g/dL (ref 3.5–5.0)
Anion gap: 9 (ref 5–15)
BILIRUBIN TOTAL: 0.9 mg/dL (ref 0.2–1.6)
BUN: 30 mg/dL — ABNORMAL HIGH (ref 7–22)
CHLORIDE: 104 mmol/L (ref 98–108)
CO2: 28 mmol/L (ref 18–33)
CREATININE: 1 mg/dL (ref 0.60–1.20)
Calcium: 9.1 mg/dL (ref 8.0–10.3)
Glucose, Bld: 119 mg/dL — ABNORMAL HIGH (ref 73–118)
POTASSIUM: 3.1 mmol/L — AB (ref 3.3–4.7)
Sodium: 141 mmol/L (ref 128–145)
TOTAL PROTEIN: 7.5 g/dL (ref 6.4–8.1)

## 2017-08-03 LAB — FERRITIN: Ferritin: 225 ng/mL (ref 9–269)

## 2017-08-03 LAB — TECHNOLOGIST SMEAR REVIEW

## 2017-08-03 LAB — LACTATE DEHYDROGENASE: LDH: 271 U/L — AB (ref 125–245)

## 2017-08-03 MED ORDER — ANAGRELIDE HCL 1 MG PO CAPS: ORAL_CAPSULE | ORAL | 6 refills | Status: DC

## 2017-08-03 NOTE — Progress Notes (Signed)
Hematology and Oncology Follow Up Visit  Alexandra [name] [id number] 1950-06-03 67 y.o. 08/03/2017   Principle Diagnosis:   Anagrelide 5 mg by mouth daily - dose changed today  Aspirin 81 mg by mouth daily  Folic acid 2 mg by mouth daily   Current Therapy:    Essential thrombocythemia-Calreticulin positive      Interim History:  [name] is back for followup.  So far, everything is going quite well for her.  She is getting ready for some of her family to visit this week.   She has had no issues with nausea or vomiting.  She is had no fatigue.  She has had no cough or shortness of breath.  She is trying to exercise.  She has eaten pretty well.  She has had no nausea or vomiting.  Her iron studies that we checked back in April showed a ferritin of 223 with an iron saturation of 29%.  We did increase her dose of anagrelide.  She has tolerated this nicely.  Overall, her performance status is ECOG 0.  Medications:  Current Outpatient Medications:  .  amLODipine (NORVASC) 10 MG tablet, Take 1 tablet (10 mg total) by mouth daily., Disp: 90 tablet, Rfl: 1 .  anagrelide (AGRYLIN) 1 MG capsule, TAKE 5 CAPSULES(5 MG) BY MOUTH DAILY, Disp: 150 capsule, Rfl: 6 .  aspirin 81 MG tablet, Take 81 mg by mouth daily., Disp: , Rfl:  .  Fe Fum-FePoly-Vit C-Vit B3 (INTEGRA) 62.5-62.5-40-3 MG CAPS, Take 1 tablet by mouth daily., Disp: 30 capsule, Rfl: 8 .  folic acid (FOLVITE) 1 MG tablet, Take 2 tablets (2 mg total) by mouth daily. (Patient taking differently: Take 2 mg by mouth daily. 12.4.18-pt taking 1 mg daily), Disp: 180 tablet, Rfl: 3 .  glucose blood (ONE TOUCH ULTRA TEST) test strip, Use to test blood sugar 1 times daily. **PT NEEDS FOLLOW UP APPT FOR FURTHER REFILLS**, Disp: 100 each, Rfl: 0 .  lisinopril (PRINIVIL,ZESTRIL) 20 MG tablet, TAKE 2 TABLETS BY MOUTH ONCE DAILY, Disp: 180 tablet, Rfl: 1 .  Nebivolol HCl (BYSTOLIC) 20 MG TABS, Take 1 tablet (20 mg total) by mouth daily., Disp: 90  tablet, Rfl: 1 .  ONETOUCH DELICA LANCETS 06C MISC, Use to test blood sugar 1 time daily. **PT NEEDS FOLLOW UP APPT FOR FURTHER REFILLS**, Disp: 100 each, Rfl: 1 .  Vitamin D, Ergocalciferol, (DRISDOL) 50000 units CAPS capsule, TAKE 1 CAPSULE BY MOUTH 1 TIME A WEEK, Disp: 12 capsule, Rfl: 0  Allergies: No Known Allergies  Past Medical History, Surgical history, Social history, and Family History were reviewed and updated.  Review of Systems: Review of Systems  All other systems reviewed and are negative. Marland Kitchen  Physical Exam:  weight is 148 lb 1.9 oz (67.2 kg). Her oral temperature is 97.8 F (36.6 C). Her blood pressure is 134/73 and her pulse is 80. Her respiration is 17 and oxygen saturation is 100%.   Physical Exam  Constitutional: She is oriented to person, place, and time.  HENT:  Head: Normocephalic and atraumatic.  Mouth/Throat: Oropharynx is clear and moist.  Eyes: Pupils are equal, round, and reactive to light. EOM are normal.  Neck: Normal range of motion.  Cardiovascular: Normal rate, regular rhythm and normal heart sounds.  Pulmonary/Chest: Effort normal and breath sounds normal.  Abdominal: Soft. Bowel sounds are normal.  Musculoskeletal: Normal range of motion. She exhibits no edema, tenderness or deformity.  Lymphadenopathy:    She has no cervical adenopathy.  Neurological: She is alert and oriented to person, place, and time.  Skin: Skin is warm and dry. No rash noted. No erythema.  Psychiatric: She has a normal mood and affect. Her behavior is normal. Judgment and thought content normal.  Vitals reviewed.    Lab Results  Component Value Date   WBC 7.8 08/03/2017   HGB 8.6 (L) 08/03/2017   HCT 27.0 (L) 08/03/2017   MCV 79.9 (L) 08/03/2017   PLT 575 (H) 08/03/2017     Chemistry      Component Value Date/Time   NA 141 08/03/2017 0916   NA 144 02/02/2017 1158   NA 140 07/24/2016 1007   K 3.1 (L) 08/03/2017 0916   K 3.7 02/02/2017 1158   K 3.6 07/24/2016  1007   CL 104 08/03/2017 0916   CL 103 02/02/2017 1158   CO2 28 08/03/2017 0916   CO2 29 02/02/2017 1158   CO2 26 07/24/2016 1007   BUN 30 (H) 08/03/2017 0916   BUN 20 02/02/2017 1158   BUN 30.3 (H) 07/24/2016 1007   CREATININE 1.00 08/03/2017 0916   CREATININE 0.9 02/02/2017 1158   CREATININE 1.1 07/24/2016 1007   GLU 129 02/02/2017      Component Value Date/Time   CALCIUM 9.1 08/03/2017 0916   CALCIUM 9.0 02/02/2017 1158   CALCIUM 9.3 07/24/2016 1007   ALKPHOS 93 (H) 08/03/2017 0916   ALKPHOS 110 (H) 02/02/2017 1158   ALKPHOS 104 07/24/2016 1007   AST 23 08/03/2017 0916   AST 13 07/24/2016 1007   ALT 21 08/03/2017 0916   ALT 26 02/02/2017 1158   ALT 15 07/24/2016 1007   BILITOT 0.9 08/03/2017 0916   BILITOT 0.55 07/24/2016 1007      Impression and Plan: Alexandra [name] is 67 year old [name]. She has essential thrombocythemia. She actually is Calreticulin positive.  Overall, her blood counts to look relatively stable.  As such, I think we can hold off on any change or interventions.  I think we can get her back in a 2 months.  I think this would be reasonable.  She is quite busy with her part-time job.  I really do not want to interfere with this.  [name], MD 6/4/201910:08 AM

## 2017-08-04 LAB — ERYTHROPOIETIN: Erythropoietin: 23.8 m[IU]/mL — ABNORMAL HIGH (ref 2.6–18.5)

## 2017-08-18 ENCOUNTER — Encounter: Payer: Medicare Other | Admitting: Internal Medicine

## 2017-09-10 ENCOUNTER — Other Ambulatory Visit: Payer: Self-pay | Admitting: Internal Medicine

## 2017-09-13 NOTE — Telephone Encounter (Signed)
Copied from [place] [id number]. Topic: Quick Communication - Rx Refill/Question >> Sep 13, 2017  9:16 AM Bea Graff, NT wrote: Medication: Nebivolol HCl (BYSTOLIC) 20 MG TABS  Has the patient contacted their pharmacy? Yes.   (Agent: If no, request that the patient contact the pharmacy for the refill.) (Agent: If yes, when and what did the pharmacy advise?)  Preferred Pharmacy (with phone number or street name): [facility], [place] [phone] (Phone) [phone] (Fax)      Agent: Please be advised that RX refills may take up to 3 business days. We ask that you follow-up with your pharmacy.

## 2017-09-13 NOTE — Telephone Encounter (Signed)
Bystolic refill Last AFOADL:2/58/94 # 90 1 RF Last OV: 07/19/17 PCP: Dr Regis Bill Pharmacy:[facility] [address]

## 2017-10-07 ENCOUNTER — Other Ambulatory Visit: Payer: Self-pay | Admitting: Family

## 2017-10-11 ENCOUNTER — Other Ambulatory Visit: Payer: Self-pay

## 2017-10-11 ENCOUNTER — Encounter: Payer: Self-pay | Admitting: Hematology & Oncology

## 2017-10-11 ENCOUNTER — Inpatient Hospital Stay: Payer: Medicare Other | Attending: Hematology & Oncology | Admitting: Hematology & Oncology

## 2017-10-11 ENCOUNTER — Inpatient Hospital Stay: Payer: Medicare Other

## 2017-10-11 VITALS — BP 145/84 | HR 87 | Temp 98.0°F | Resp 16 | Wt 147.0 lb

## 2017-10-11 DIAGNOSIS — D473 Essential (hemorrhagic) thrombocythemia: Secondary | ICD-10-CM | POA: Insufficient documentation

## 2017-10-11 DIAGNOSIS — D500 Iron deficiency anemia secondary to blood loss (chronic): Secondary | ICD-10-CM

## 2017-10-11 LAB — CMP (CANCER CENTER ONLY)
ALBUMIN: 3.8 g/dL (ref 3.5–5.0)
ALT: 19 U/L (ref 0–44)
AST: 22 U/L (ref 15–41)
Alkaline Phosphatase: 98 U/L (ref 38–126)
Anion gap: 10 (ref 5–15)
BILIRUBIN TOTAL: 0.7 mg/dL (ref 0.3–1.2)
BUN: 19 mg/dL (ref 8–23)
CO2: 25 mmol/L (ref 22–32)
CREATININE: 0.85 mg/dL (ref 0.44–1.00)
Calcium: 9.3 mg/dL (ref 8.9–10.3)
Chloride: 104 mmol/L (ref 98–111)
GFR, Est AFR Am: >60 mL/min (ref >60)
GFR, Estimated: >60 mL/min (ref >60)
GLUCOSE: 120 mg/dL — AB (ref 70–99)
POTASSIUM: 3.4 mmol/L — AB (ref 3.5–5.1)
Sodium: 139 mmol/L (ref 135–145)
TOTAL PROTEIN: 7.8 g/dL (ref 6.5–8.1)

## 2017-10-11 LAB — CBC WITH DIFFERENTIAL (CANCER CENTER ONLY)
Basophils Absolute: 0.1 10*3/uL (ref 0.0–0.1)
Basophils Relative: 1 %
EOS PCT: 2 %
Eosinophils Absolute: 0.1 10*3/uL (ref 0.0–0.5)
HCT: 27.5 % — ABNORMAL LOW (ref 34.8–46.6)
HEMOGLOBIN: 8.4 g/dL — AB (ref 11.6–15.9)
LYMPHS ABS: 1.6 10*3/uL (ref 0.9–3.3)
Lymphocytes Relative: 22 %
MCH: 24.6 pg — AB (ref 26.0–34.0)
MCHC: 30.5 g/dL — AB (ref 32.0–36.0)
MCV: 80.6 fL — ABNORMAL LOW (ref 81.0–101.0)
MONO ABS: 0.7 10*3/uL (ref 0.1–0.9)
MONOS PCT: 10 %
Neutro Abs: 4.8 10*3/uL (ref 1.5–6.5)
Neutrophils Relative %: 65 %
PLATELETS: 573 10*3/uL — AB (ref 145–400)
RBC: 3.41 MIL/uL — ABNORMAL LOW (ref 3.70–5.32)
RDW: 19.4 % — ABNORMAL HIGH (ref 11.1–15.7)
WBC Count: 7.3 10*3/uL (ref 3.9–10.0)

## 2017-10-11 LAB — FERRITIN: Ferritin: 222 ng/mL (ref 11–307)

## 2017-10-11 LAB — IRON AND TIBC
Iron: 73 ug/dL (ref 41–142)
SATURATION RATIOS: 32 % (ref 21–57)
TIBC: 226 ug/dL — AB (ref 236–444)
UIBC: 154 ug/dL

## 2017-10-11 LAB — RETICULOCYTES
RBC.: 3.49 MIL/uL — ABNORMAL LOW (ref 3.70–5.45)
RETIC CT PCT: 1.4 % (ref 0.7–2.1)
Retic Count, Absolute: 48.9 10*3/uL (ref 33.7–90.7)

## 2017-10-11 LAB — LACTATE DEHYDROGENASE: LDH: 260 U/L — ABNORMAL HIGH (ref 98–192)

## 2017-10-11 NOTE — Progress Notes (Signed)
Hematology and Oncology Follow Up Visit  Alexandra [name] [id number] 11-04-1950 67 y.o. 10/11/2017   Principle Diagnosis:   Anagrelide 5 mg by mouth daily - dose changed today  Aspirin 81 mg by mouth daily  Folic acid 2 mg by mouth daily   Current Therapy:    Essential thrombocythemia-Calreticulin positive      Interim History:  [name] is back for followup.  So far, she has had a good summer.  She is always, went down to [place], [place] for the annual family reunion.  This was July 4 weekend.  For Christmas, she is going down to [place], [place].  She is doing well with the anagrelide and aspirin.  She is had no swelling.  She has had no cough.  She is had no fever.  She is had no nausea or vomiting.  She is had no change in bowel or bladder habits.  Her iron studies that we saw back in June showed a ferritin of 225 and iron saturation of 27%.  She is exercising.  She does a lot of walking.   Overall, her performance status is ECOG 0.  Medications:  Current Outpatient Medications:  .  amLODipine (NORVASC) 10 MG tablet, Take 1 tablet (10 mg total) by mouth daily., Disp: 90 tablet, Rfl: 1 .  anagrelide (AGRYLIN) 1 MG capsule, TAKE 5 CAPSULES(5 MG) BY MOUTH DAILY, Disp: 150 capsule, Rfl: 6 .  aspirin 81 MG tablet, Take 81 mg by mouth daily., Disp: , Rfl:  .  BYSTOLIC 20 MG TABS, TAKE 1 TABLET BY MOUTH ONCE DAILY, Disp: 90 tablet, Rfl: 0 .  Fe Fum-FePoly-Vit C-Vit B3 (INTEGRA) 62.5-62.5-40-3 MG CAPS, Take 1 tablet by mouth daily., Disp: 30 capsule, Rfl: 8 .  folic acid (FOLVITE) 1 MG tablet, Take 2 tablets (2 mg total) by mouth daily. (Patient taking differently: Take 2 mg by mouth daily. 12.4.18-pt taking 1 mg daily), Disp: 180 tablet, Rfl: 3 .  glucose blood (ONE TOUCH ULTRA TEST) test strip, Use to test blood sugar 1 times daily. **PT NEEDS FOLLOW UP APPT FOR FURTHER REFILLS**, Disp: 100 each, Rfl: 0 .  lisinopril (PRINIVIL,ZESTRIL) 20 MG tablet, TAKE 2  TABLETS BY MOUTH ONCE DAILY, Disp: 180 tablet, Rfl: 1 .  Nebivolol HCl (BYSTOLIC) 20 MG TABS, Take 1 tablet (20 mg total) by mouth daily., Disp: 90 tablet, Rfl: 1 .  ONETOUCH DELICA LANCETS 11H MISC, Use to test blood sugar 1 time daily. **PT NEEDS FOLLOW UP APPT FOR FURTHER REFILLS**, Disp: 100 each, Rfl: 1 .  Vitamin D, Ergocalciferol, (DRISDOL) 50000 units CAPS capsule, TAKE 1 CAPSULE BY MOUTH 1 TIME A WEEK, Disp: 12 capsule, Rfl: 0  Allergies: No Known Allergies  Past Medical History, Surgical history, Social history, and Family History were reviewed and updated.  Review of Systems: Review of Systems  All other systems reviewed and are negative. Marland Kitchen  Physical Exam:  weight is 147 lb (66.7 kg). Her oral temperature is 98 F (36.7 C). Her blood pressure is 145/84 (abnormal) and her pulse is 87. Her respiration is 16 and oxygen saturation is 100%.   Physical Exam  Constitutional: She is oriented to person, place, and time.  HENT:  Head: Normocephalic and atraumatic.  Mouth/Throat: Oropharynx is clear and moist.  Eyes: Pupils are equal, round, and reactive to light. EOM are normal.  Neck: Normal range of motion.  Cardiovascular: Normal rate, regular rhythm and normal heart sounds.  Pulmonary/Chest: Effort normal and breath sounds normal.  Abdominal: Soft. Bowel sounds are normal.  Musculoskeletal: Normal range of motion. She exhibits no edema, tenderness or deformity.  Lymphadenopathy:    She has no cervical adenopathy.  Neurological: She is alert and oriented to person, place, and time.  Skin: Skin is warm and dry. No rash noted. No erythema.  Psychiatric: She has a normal mood and affect. Her behavior is normal. Judgment and thought content normal.  Vitals reviewed.    Lab Results  Component Value Date   WBC 7.3 10/11/2017   HGB 8.4 (L) 10/11/2017   HCT 27.5 (L) 10/11/2017   MCV 80.6 (L) 10/11/2017   PLT 573 (H) 10/11/2017     Chemistry      Component Value Date/Time    NA 141 08/03/2017 0916   NA 144 02/02/2017 1158   NA 140 07/24/2016 1007   K 3.1 (L) 08/03/2017 0916   K 3.7 02/02/2017 1158   K 3.6 07/24/2016 1007   CL 104 08/03/2017 0916   CL 103 02/02/2017 1158   CO2 28 08/03/2017 0916   CO2 29 02/02/2017 1158   CO2 26 07/24/2016 1007   BUN 30 (H) 08/03/2017 0916   BUN 20 02/02/2017 1158   BUN 30.3 (H) 07/24/2016 1007   CREATININE 1.00 08/03/2017 0916   CREATININE 0.9 02/02/2017 1158   CREATININE 1.1 07/24/2016 1007   GLU 129 02/02/2017      Component Value Date/Time   CALCIUM 9.1 08/03/2017 0916   CALCIUM 9.0 02/02/2017 1158   CALCIUM 9.3 07/24/2016 1007   ALKPHOS 93 (H) 08/03/2017 0916   ALKPHOS 110 (H) 02/02/2017 1158   ALKPHOS 104 07/24/2016 1007   AST 23 08/03/2017 0916   AST 13 07/24/2016 1007   ALT 21 08/03/2017 0916   ALT 26 02/02/2017 1158   ALT 15 07/24/2016 1007   BILITOT 0.9 08/03/2017 0916   BILITOT 0.55 07/24/2016 1007      Impression and Plan: Alexandra [name] is 67 year old [name]. She has essential thrombocythemia. She actually is Calreticulin positive.  Overall, her blood counts to look relatively stable.  As such, I think we can hold off on any change or interventions.  I think we can get her back in a 2 months.  I think this would be reasonable.    Marland Kitchen [name], MD 8/12/201911:14 AM

## 2017-10-14 ENCOUNTER — Encounter (INDEPENDENT_AMBULATORY_CARE_PROVIDER_SITE_OTHER): Payer: Medicare Other | Admitting: Ophthalmology

## 2017-10-14 DIAGNOSIS — I10 Essential (primary) hypertension: Secondary | ICD-10-CM | POA: Diagnosis not present

## 2017-10-14 DIAGNOSIS — E113293 Type 2 diabetes mellitus with mild nonproliferative diabetic retinopathy without macular edema, bilateral: Secondary | ICD-10-CM | POA: Diagnosis not present

## 2017-10-14 DIAGNOSIS — H2513 Age-related nuclear cataract, bilateral: Secondary | ICD-10-CM

## 2017-10-14 DIAGNOSIS — H43813 Vitreous degeneration, bilateral: Secondary | ICD-10-CM

## 2017-10-14 DIAGNOSIS — H35033 Hypertensive retinopathy, bilateral: Secondary | ICD-10-CM

## 2017-10-14 DIAGNOSIS — H33302 Unspecified retinal break, left eye: Secondary | ICD-10-CM

## 2017-10-14 DIAGNOSIS — E11319 Type 2 diabetes mellitus with unspecified diabetic retinopathy without macular edema: Secondary | ICD-10-CM

## 2017-10-17 NOTE — Progress Notes (Signed)
Chief Complaint  Patient presents with  . Annual Exam    HPI: Alexandra [name] 67 y.o. comes in today for Preventive Medicare exam/ wellness visit .Since last visit. Doing sell   Following for thronbocytosis  Every 8 weeks  Attention to  Sodium content and exercising  Takes vit d   Health Maintenance  Topic Date Due  . Hepatitis C Screening  Dec 24, 1950  . OPHTHALMOLOGY EXAM  12/26/2013  . PNA vac Low Risk Adult (1 of 2 - PCV13) 05/08/2015  . FOOT EXAM  04/30/2016  . INFLUENZA VACCINE  09/30/2017  . HEMOGLOBIN A1C  01/19/2018  . COLONOSCOPY  05/02/2019  . MAMMOGRAM  07/22/2019  . TETANUS/TDAP  02/26/2021  . DEXA SCAN  Completed   Health Maintenance Review LIFESTYLE:  Exercise:   Walking    Tobacco/ETS: no Alcohol:  no Sugar beverages: no Sleep: 6-8  Drug use: no HH:  2  No pets   Retired .    Hearing:  Ok   Vision:  No limitations at present . Last eye check UTD  Early galucoma and matthews    And  Referred to dr Katy Fitch.   Safety:  Has smoke detector and wears seat belts.  No firearms. No excess sun exposure. Sees dentist regularly.  Falls:  no  Memory: Felt to be good  , no concern from her or her family.  Depression: No anhedonia unusual crying or depressive symptoms  Nutrition: Eats well balanced diet; adequate calcium and vitamin D. No swallowing chewing problems.  Injury: no major injuries in the last six months.  Other healthcare providers:  Reviewed today .  Social:  Lives with spouse married. No pets.   Preventive parameters: up-to-date  Reviewed   ADLS:   There are no problems or need for assistance  driving, feeding, obtaining food, dressing, toileting and bathing, managing money using phone. She is independent.h   ROS:  GEN/ HEENT: No fever, significant weight changes sweats headaches vision problems hearing changes, CV/ PULM; No chest pain shortness of breath cough, syncope,edema  change in exercise tolerance. GI /GU: No adominal pain,  vomiting, change in bowel habits. No blood in the stool. No significant GU symptoms. SKIN/HEME: ,no acute skin rashes suspicious lesions or bleeding. No lymphadenopathy, nodules, masses.  NEURO/ PSYCH:  No neurologic signs such as weakness numbness. No depression anxiety. IMM/ Allergy: No unusual infections.  Allergy .   REST of 12 system review negative except as per HPI   Past Medical History:  Diagnosis Date  . Diabetes mellitus without complication ([place])   . Hypertension    echo   . Iron deficiency anemia, unspecified 03/15/2013  . Thrombocytosis ([facility])     Family History  Problem Relation Age of Onset  . Heart disease Mother        died age 33  . Kidney disease Mother        family hx  . Heart attack Father 45       massive  . Hypertension Sister   . Hypertension Sister   . Hypertension Sister   . Hypertension Sister   . Arthritis Unknown        family hx  . Diabetes Unknown        family hx  . Stroke Unknown         dialysis    Social History   Socioeconomic History  . Marital status: Married    Spouse name: Not on file  . Number of children:  Not on file  . Years of education: Not on file  . Highest education level: Not on file  Occupational History  . Not on file  Social Needs  . Financial resource strain: Not on file  . Food insecurity:    Worry: Not on file    Inability: Not on file  . Transportation needs:    Medical: Not on file    Non-medical: Not on file  Tobacco Use  . Smoking status: Never Smoker  . Smokeless tobacco: Never Used  . Tobacco comment: never used tobacco  Substance and Sexual Activity  . Alcohol use: No    Alcohol/week: 0.0 standard drinks  . Drug use: No  . Sexual activity: Not on file  Lifestyle  . Physical activity:    Days per week: Not on file    Minutes per session: Not on file  . Stress: Not on file  Relationships  . Social connections:    Talks on phone: Not on file    Gets together: Not on file    Attends  religious service: Not on file    Active member of club or organization: Not on file    Attends meetings of clubs or organizations: Not on file    Relationship status: Not on file  Other Topics Concern  . 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   No tobacco no ethoh little caffiene .     Outpatient Encounter Medications as of 10/18/2017  Medication Sig  . amLODipine (NORVASC) 10 MG tablet Take 1 tablet (10 mg total) by mouth daily.  Marland Kitchen anagrelide (AGRYLIN) 1 MG capsule TAKE 5 CAPSULES(5 MG) BY MOUTH DAILY  . aspirin 81 MG tablet Take 81 mg by mouth daily.  Marland Kitchen BYSTOLIC 20 MG TABS TAKE 1 TABLET BY MOUTH ONCE DAILY  . Fe Fum-FePoly-Vit C-Vit B3 (INTEGRA) 62.5-62.5-40-3 MG CAPS Take 1 tablet by mouth daily.  . folic acid (FOLVITE) 1 MG tablet Take 2 tablets (2 mg total) by mouth daily. (Patient taking differently: Take 2 mg by mouth daily. 12.4.18-pt taking 1 mg daily)  . glucose blood (ONE TOUCH ULTRA TEST) test strip Use to test blood sugar 1 times daily. **PT NEEDS FOLLOW UP APPT FOR FURTHER REFILLS**  . lisinopril (PRINIVIL,ZESTRIL) 20 MG tablet TAKE 2 TABLETS BY MOUTH ONCE DAILY  . Nebivolol HCl (BYSTOLIC) 20 MG TABS Take 1 tablet (20 mg total) by mouth daily.  Glory Rosebush DELICA LANCETS 70J MISC Use to test blood sugar 1 time daily. **PT NEEDS FOLLOW UP APPT FOR FURTHER REFILLS**  . Vitamin D, Ergocalciferol, (DRISDOL) 50000 units CAPS capsule TAKE 1 CAPSULE BY MOUTH 1 TIME A WEEK   No facility-administered encounter medications on file as of 10/18/2017.     EXAM:  BP 138/76 (BP Location: Right Arm, Patient Position: Sitting, Cuff Size: Normal)   Pulse 100   Temp 98.9 F (37.2 C) (Oral)   Ht '5\' 11"'  (1.803 m)   Wt 146 lb 6.4 oz (66.4 kg)   SpO2 97%   BMI 20.42 kg/m   Body mass index is 20.42 kg/m.  Physical Exam: Vital signs reviewed [id number] is a well-developed well-nourished alert cooperative    who appears stated age in no acute distress.  HEENT: normocephalic atraumatic , Eyes: PERRL EOM's full, conjunctiva clear, Nares: paten,t no deformity discharge or tenderness., Ears:  no deformity EAC's clear TMs with normal landmarks. Mouth: clear OP, no lesions, edema.  Moist mucous membranes. Dentition in adequate repair. NECK: supple without masses, thyromegaly or bruits. CHEST/PULM:  Clear to auscultation and percussion breath sounds equal no wheeze , rales or rhonchi. No chest wall deformities or tenderness. CV: PMI is nondisplaced, S1 S2 no gallops, murmurs, rubs. Peripheral pulses are full without delay.No JVD . Breast: normal by inspection . No dimpling, discharge, masses, tenderness or discharge . ABDOMEN: Bowel sounds normal nontender  No guard or rebound, no hepato splenomegal no CVA tenderness.   Extremtities:  No clubbing cyanosis or edema, no acute joint swelling or redness no focal atrophy NEURO:  Oriented x3, cranial nerves 3-12 appear to be intact, no obvious focal weakness,gait within normal limits no abnormal reflexes or asymmetrical SKIN: No acute rashes normal turgor, color, no bruising or petechiae. PSYCH: Oriented, good eye contact, no obvious depression anxiety, cognition and judgment appear normal. LN: no cervical axillary inguinal adenopathy No noted deficits in memory, attention, and speech. Diabetic Foot Exam - Simple   Simple Foot Form Diabetic Foot exam was performed with the following findings:  Yes 10/18/2017 10:57 AM  Visual Inspection No deformities, no ulcerations, no other skin breakdown bilaterally:  Yes Sensation Testing Intact to touch and monofilament testing bilaterally:  Yes Pulse Check Posterior Tibialis and Dorsalis pulse intact bilaterally:  Yes Comments      Lab Results  Component Value Date   WBC 7.3 10/11/2017   HGB 8.4 (L) 10/11/2017   HCT 27.5 (L) 10/11/2017   PLT 573 (H) 10/11/2017   GLUCOSE 120 (H) 10/11/2017   CHOL 227 (H)  11/17/2016   TRIG 62.0 11/17/2016   HDL 71.90 11/17/2016   LDLDIRECT 127.3 04/19/2012   LDLCALC 143 (H) 11/17/2016   ALT 19 10/11/2017   AST 22 10/11/2017   NA 139 10/11/2017   K 3.4 (L) 10/11/2017   CL 104 10/11/2017   CREATININE 0.85 10/11/2017   BUN 19 10/11/2017   CO2 25 10/11/2017   TSH 1.62 05/14/2016   HGBA1C 5.9 07/19/2017   MICROALBUR 1.8 05/14/2016    ASSESSMENT AND PLAN:  Discussed the following assessment and plan: consdier  Lipid medication  Based on risk  Factors  Visit for preventive health examination  Controlled type 2 diabetes mellitus without complication, unspecified whether long term insulin use ([place]) - controlled by diet - Plan: Lipid panel, Glucose, Random  Essential hypertension - controlled today   since retirement has  attendted to  - Plan: Lipid panel, Glucose, Random  Medication management - Plan: Lipid panel, Glucose, Random  23-polyvalent pneumococcal polysaccharide vaccine declined  Influenza vaccination declined  THROMBOCYTHEMIA The 10-year ASCVD risk score [name] DC Jr., et al., 2013) is: 28.5%   Values used to calculate the score:     Age: 80 years     Sex: Female     Is Non-Hispanic African American: Yes     Diabetic: Yes     Tobacco smoker: No     Systolic Blood Pressure: 993 mmHg     Is BP treated: Yes     HDL Cholesterol: 71.9 mg/dL     Total Cholesterol: 227 mg/dL   Fasting BG and lipie  today  Declines pna and flu vaccine  States utd on mammo and eye exam   ( matthews ) Patient Care Team: Lindsy Cerullo, Standley Brooking, MD as PCP - General Marin Olp Rudell Cobb, MD as Attending Physician (Hematology and Oncology) Philemon Kingdom, MD as  Consulting Physician (Internal Medicine)  Patient Instructions  STatin medication advised cause of diabetes    .  To reduce risk of  Stroke and heart attack  Will check today and adise  Glad you are doing so well...  ROV in  4-6 months so we can recheck your hg a1c  January if  Weather permits     Health Maintenance, Female Adopting a healthy lifestyle and getting preventive care can go a long way to promote health and wellness. Talk with your health care provider about what schedule of regular examinations is right for you. This is a good chance for you to check in with your provider about disease prevention and staying healthy. In between checkups, there are plenty of things you can do on your own. Experts have done a lot of research about which lifestyle changes and preventive measures are most likely to keep you healthy. Ask your health care provider for more information. Weight and diet Eat a healthy diet  Be sure to include plenty of vegetables, fruits, low-fat dairy products, and lean protein.  Do not eat a lot of foods high in solid fats, added sugars, or salt.  Get regular exercise. This is one of the most important things you can do for your health. ? Most adults should exercise for at least 150 minutes each week. The exercise should increase your heart rate and make you sweat (moderate-intensity exercise). ? Most adults should also do strengthening exercises at least twice a week. This is in addition to the moderate-intensity exercise.  Maintain a healthy weight  Body mass index (BMI) is a measurement that can be used to identify possible weight problems. It estimates body fat based on height and weight. Your health care provider can help determine your BMI and help you achieve or maintain a healthy weight.  For females 19 years of age and older: ? A BMI below 18.5 is considered underweight. ? A BMI of 18.5 to 24.9 is normal. ? A BMI of 25 to 29.9 is considered overweight. ? A BMI of 30 and above is considered obese.  Watch levels of cholesterol and blood lipids  You should start having your blood tested for lipids and cholesterol at 67 years of age, then have this test every 5 years.  You may need to have your cholesterol levels checked more often if: ? Your  lipid or cholesterol levels are high. ? You are older than 67 years of age. ? You are at high risk for heart disease.  Cancer screening Lung Cancer  Lung cancer screening is recommended for adults 68-57 years old who are at high risk for lung cancer because of a history of smoking.  A yearly low-dose CT scan of the lungs is recommended for people who: ? Currently smoke. ? Have quit within the past 15 years. ? Have at least a 30-pack-year history of smoking. A pack year is smoking an average of one pack of cigarettes a day for 1 year.  Yearly screening should continue until it has been 15 years since you quit.  Yearly screening should stop if you develop a health problem that would prevent you from having lung cancer treatment.  Breast Cancer  Practice breast self-awareness. This means understanding how your breasts normally appear and feel.  It also means doing regular breast self-exams. Let your health care provider know about any changes, no matter how small.  If you are in your 20s or 30s, you should have a clinical breast  exam (CBE) by a health care provider every 1-3 years as part of a regular health exam.  If you are 67 or older, have a CBE every year. Also consider having a breast X-ray (mammogram) every year.  If you have a family history of breast cancer, talk to your health care provider about genetic screening.  If you are at high risk for breast cancer, talk to your health care provider about having an MRI and a mammogram every year.  Breast cancer gene (BRCA) assessment is recommended for women who have family members with BRCA-related cancers. BRCA-related cancers include: ? Breast. ? Ovarian. ? Tubal. ? Peritoneal cancers.  Results of the assessment will determine the need for genetic counseling and BRCA1 and BRCA2 testing.  Cervical Cancer Your health care provider may recommend that you be screened regularly for cancer of the pelvic organs (ovaries, uterus,  and vagina). This screening involves a pelvic examination, including checking for microscopic changes to the surface of your cervix (Pap test). You may be encouraged to have this screening done every 3 years, beginning at age 8.  For women ages 57-65, health care providers may recommend pelvic exams and Pap testing every 3 years, or they may recommend the Pap and pelvic exam, combined with testing for human papilloma virus (HPV), every 5 years. Some types of HPV increase your risk of cervical cancer. Testing for HPV may also be done on women of any age with unclear Pap test results.  Other health care providers may not recommend any screening for nonpregnant women who are considered low risk for pelvic cancer and who do not have symptoms. Ask your health care provider if a screening pelvic exam is right for you.  If you have had past treatment for cervical cancer or a condition that could lead to cancer, you need Pap tests and screening for cancer for at least 20 years after your treatment. If Pap tests have been discontinued, your risk factors (such as having a new sexual partner) need to be reassessed to determine if screening should resume. Some women have medical problems that increase the chance of getting cervical cancer. In these cases, your health care provider may recommend more frequent screening and Pap tests.  Colorectal Cancer  This type of cancer can be detected and often prevented.  Routine colorectal cancer screening usually begins at 67 years of age and continues through 67 years of age.  Your health care provider may recommend screening at an earlier age if you have risk factors for colon cancer.  Your health care provider may also recommend using home test kits to check for hidden blood in the stool.  A small camera at the end of a tube can be used to examine your colon directly (sigmoidoscopy or colonoscopy). This is done to check for the earliest forms of colorectal  cancer.  Routine screening usually begins at age 50.  Direct examination of the colon should be repeated every 5-10 years through 67 years of age. However, you may need to be screened more often if early forms of precancerous polyps or small growths are found.  Skin Cancer  Check your skin from head to toe regularly.  Tell your health care provider about any new moles or changes in moles, especially if there is a change in a mole's shape or color.  Also tell your health care provider if you have a mole that is larger than the size of a pencil eraser.  Always use sunscreen. Apply sunscreen  liberally and repeatedly throughout the day.  Protect yourself by wearing long sleeves, pants, a wide-brimmed hat, and sunglasses whenever you are outside.  Heart disease, diabetes, and high blood pressure  High blood pressure causes heart disease and increases the risk of stroke. High blood pressure is more likely to develop in: ? People who have blood pressure in the high end of the normal range (130-139/85-89 mm Hg). ? People who are overweight or obese. ? People who are African American.  If you are 66-56 years of age, have your blood pressure checked every 3-5 years. If you are 49 years of age or older, have your blood pressure checked every year. You should have your blood pressure measured twice-once when you are at a hospital or clinic, and once when you are not at a hospital or clinic. Record the average of the two measurements. To check your blood pressure when you are not at a hospital or clinic, you can use: ? An automated blood pressure machine at a pharmacy. ? A home blood pressure monitor.  If you are between 48 years and 27 years old, ask your health care provider if you should take aspirin to prevent strokes.  Have regular diabetes screenings. This involves taking a blood sample to check your fasting blood sugar level. ? If you are at a normal weight and have a low risk for diabetes,  have this test once every three years after 67 years of age. ? If you are overweight and have a high risk for diabetes, consider being tested at a younger age or more often. Preventing infection Hepatitis B  If you have a higher risk for hepatitis B, you should be screened for this virus. You are considered at high risk for hepatitis B if: ? You were born in a country where hepatitis B is common. Ask your health care provider which countries are considered high risk. ? Your parents were born in a high-risk country, and you have not been immunized against hepatitis B (hepatitis B vaccine). ? You have HIV or AIDS. ? You use needles to inject street drugs. ? You live with someone who has hepatitis B. ? You have had sex with someone who has hepatitis B. ? You get hemodialysis treatment. ? You take certain medicines for conditions, including cancer, organ transplantation, and autoimmune conditions.  Hepatitis C  Blood testing is recommended for: ? Everyone born from [birth year] through 1965. ? Anyone with known risk factors for hepatitis C.  Sexually transmitted infections (STIs)  You should be screened for sexually transmitted infections (STIs) including gonorrhea and chlamydia if: ? You are sexually active and are younger than 67 years of age. ? You are older than 67 years of age and your health care provider tells you that you are at risk for this type of infection. ? Your sexual activity has changed since you were last screened and you are at an increased risk for chlamydia or gonorrhea. Ask your health care provider if you are at risk.  If you do not have HIV, but are at risk, it may be recommended that you take a prescription medicine daily to prevent HIV infection. This is called pre-exposure prophylaxis (PrEP). You are considered at risk if: ? You are sexually active and do not regularly use condoms or know the HIV status of your partner(s). ? You take drugs by injection. ? You are  sexually active with a partner who has HIV.  Talk with your health care provider about  whether you are at high risk of being infected with HIV. If you choose to begin PrEP, you should first be tested for HIV. You should then be tested every 3 months for as long as you are taking PrEP. Pregnancy  If you are premenopausal and you may become pregnant, ask your health care provider about preconception counseling.  If you may become pregnant, take 400 to 800 micrograms (mcg) of folic acid every day.  If you want to prevent pregnancy, talk to your health care provider about birth control (contraception). Osteoporosis and menopause  Osteoporosis is a disease in which the bones lose minerals and strength with aging. This can result in serious bone fractures. Your risk for osteoporosis can be identified using a bone density scan.  If you are 66 years of age or older, or if you are at risk for osteoporosis and fractures, ask your health care provider if you should be screened.  Ask your health care provider whether you should take a calcium or vitamin D supplement to lower your risk for osteoporosis.  Menopause may have certain physical symptoms and risks.  Hormone replacement therapy may reduce some of these symptoms and risks. Talk to your health care provider about whether hormone replacement therapy is right for you. Follow these instructions at home:  Schedule regular health, dental, and eye exams.  Stay current with your immunizations.  Do not use any tobacco products including cigarettes, chewing tobacco, or electronic cigarettes.  If you are pregnant, do not drink alcohol.  If you are breastfeeding, limit how much and how often you drink alcohol.  Limit alcohol intake to no more than 1 drink per day for nonpregnant women. One drink equals 12 ounces of beer, 5 ounces of wine, or 1 ounces of hard liquor.  Do not use street drugs.  Do not share needles.  Ask your health care  provider for help if you need support or information about quitting drugs.  Tell your health care provider if you often feel depressed.  Tell your health care provider if you have ever been abused or do not feel safe at home. This information is not intended to replace advice given to you by your health care provider. Make sure you discuss any questions you have with your health care provider. Document Released: 09/01/2010 Document Revised: 07/25/2015 Document Reviewed: 11/20/2014 Elsevier Interactive Patient Education  2018 [place]. Kandon Hosking M.D.

## 2017-10-18 ENCOUNTER — Encounter: Payer: Self-pay | Admitting: Internal Medicine

## 2017-10-18 ENCOUNTER — Ambulatory Visit (INDEPENDENT_AMBULATORY_CARE_PROVIDER_SITE_OTHER): Payer: Medicare Other | Admitting: Internal Medicine

## 2017-10-18 VITALS — BP 138/76 | HR 100 | Temp 98.9°F | Ht 71.0 in | Wt 146.4 lb

## 2017-10-18 DIAGNOSIS — Z2821 Immunization not carried out because of patient refusal: Secondary | ICD-10-CM | POA: Diagnosis not present

## 2017-10-18 DIAGNOSIS — I10 Essential (primary) hypertension: Secondary | ICD-10-CM

## 2017-10-18 DIAGNOSIS — Z79899 Other long term (current) drug therapy: Secondary | ICD-10-CM

## 2017-10-18 DIAGNOSIS — E119 Type 2 diabetes mellitus without complications: Secondary | ICD-10-CM | POA: Diagnosis not present

## 2017-10-18 DIAGNOSIS — Z0000 Encounter for general adult medical examination without abnormal findings: Secondary | ICD-10-CM

## 2017-10-18 DIAGNOSIS — D473 Essential (hemorrhagic) thrombocythemia: Secondary | ICD-10-CM

## 2017-10-18 LAB — LIPID PANEL
CHOLESTEROL: 229 mg/dL — AB (ref 0–200)
HDL: 57.4 mg/dL (ref >39.00)
LDL Cholesterol: 157 mg/dL — ABNORMAL HIGH (ref 0–99)
NonHDL: 171.58
Total CHOL/HDL Ratio: 4
Triglycerides: 74 mg/dL (ref 0.0–149.0)
VLDL: 14.8 mg/dL (ref 0.0–40.0)

## 2017-10-18 LAB — GLUCOSE, RANDOM: GLUCOSE: 112 mg/dL — AB (ref 70–99)

## 2017-10-18 NOTE — Patient Instructions (Addendum)
STatin medication advised cause of diabetes    .  To reduce risk of  Stroke and heart attack  Will check today and adise  Glad you are doing so well...  ROV in  4-6 months so we can recheck your hg a1c  January if  Weather permits    Health Maintenance, Female Adopting a healthy lifestyle and getting preventive care can go a long way to promote health and wellness. Talk with your health care provider about what schedule of regular examinations is right for you. This is a good chance for you to check in with your provider about disease prevention and staying healthy. In between checkups, there are plenty of things you can do on your own. Experts have done a lot of research about which lifestyle changes and preventive measures are most likely to keep you healthy. Ask your health care provider for more information. Weight and diet Eat a healthy diet  Be sure to include plenty of vegetables, fruits, low-fat dairy products, and lean protein.  Do not eat a lot of foods high in solid fats, added sugars, or salt.  Get regular exercise. This is one of the most important things you can do for your health. ? Most adults should exercise for at least 150 minutes each week. The exercise should increase your heart rate and make you sweat (moderate-intensity exercise). ? Most adults should also do strengthening exercises at least twice a week. This is in addition to the moderate-intensity exercise.  Maintain a healthy weight  Body mass index (BMI) is a measurement that can be used to identify possible weight problems. It estimates body fat based on height and weight. Your health care provider can help determine your BMI and help you achieve or maintain a healthy weight.  For females 80 years of age and older: ? A BMI below 18.5 is considered underweight. ? A BMI of 18.5 to 24.9 is normal. ? A BMI of 25 to 29.9 is considered overweight. ? A BMI of 30 and above is considered obese.  Watch levels of  cholesterol and blood lipids  You should start having your blood tested for lipids and cholesterol at 67 years of age, then have this test every 5 years.  You may need to have your cholesterol levels checked more often if: ? Your lipid or cholesterol levels are high. ? You are older than 67 years of age. ? You are at high risk for heart disease.  Cancer screening Lung Cancer  Lung cancer screening is recommended for adults 8-54 years old who are at high risk for lung cancer because of a history of smoking.  A yearly low-dose CT scan of the lungs is recommended for people who: ? Currently smoke. ? Have quit within the past 15 years. ? Have at least a 30-pack-year history of smoking. A pack year is smoking an average of one pack of cigarettes a day for 1 year.  Yearly screening should continue until it has been 15 years since you quit.  Yearly screening should stop if you develop a health problem that would prevent you from having lung cancer treatment.  Breast Cancer  Practice breast self-awareness. This means understanding how your breasts normally appear and feel.  It also means doing regular breast self-exams. Let your health care provider know about any changes, no matter how small.  If you are in your 20s or 30s, you should have a clinical breast exam (CBE) by a health care provider every 1-3  years as part of a regular health exam.  If you are 64 or older, have a CBE every year. Also consider having a breast X-ray (mammogram) every year.  If you have a family history of breast cancer, talk to your health care provider about genetic screening.  If you are at high risk for breast cancer, talk to your health care provider about having an MRI and a mammogram every year.  Breast cancer gene (BRCA) assessment is recommended for women who have family members with BRCA-related cancers. BRCA-related cancers include: ? Breast. ? Ovarian. ? Tubal. ? Peritoneal cancers.  Results  of the assessment will determine the need for genetic counseling and BRCA1 and BRCA2 testing.  Cervical Cancer Your health care provider may recommend that you be screened regularly for cancer of the pelvic organs (ovaries, uterus, and vagina). This screening involves a pelvic examination, including checking for microscopic changes to the surface of your cervix (Pap test). You may be encouraged to have this screening done every 3 years, beginning at age 27.  For women ages 79-65, health care providers may recommend pelvic exams and Pap testing every 3 years, or they may recommend the Pap and pelvic exam, combined with testing for human papilloma virus (HPV), every 5 years. Some types of HPV increase your risk of cervical cancer. Testing for HPV may also be done on women of any age with unclear Pap test results.  Other health care providers may not recommend any screening for nonpregnant women who are considered low risk for pelvic cancer and who do not have symptoms. Ask your health care provider if a screening pelvic exam is right for you.  If you have had past treatment for cervical cancer or a condition that could lead to cancer, you need Pap tests and screening for cancer for at least 20 years after your treatment. If Pap tests have been discontinued, your risk factors (such as having a new sexual partner) need to be reassessed to determine if screening should resume. Some women have medical problems that increase the chance of getting cervical cancer. In these cases, your health care provider may recommend more frequent screening and Pap tests.  Colorectal Cancer  This type of cancer can be detected and often prevented.  Routine colorectal cancer screening usually begins at 67 years of age and continues through 67 years of age.  Your health care provider may recommend screening at an earlier age if you have risk factors for colon cancer.  Your health care provider may also recommend using  home test kits to check for hidden blood in the stool.  A small camera at the end of a tube can be used to examine your colon directly (sigmoidoscopy or colonoscopy). This is done to check for the earliest forms of colorectal cancer.  Routine screening usually begins at age 14.  Direct examination of the colon should be repeated every 5-10 years through 67 years of age. However, you may need to be screened more often if early forms of precancerous polyps or small growths are found.  Skin Cancer  Check your skin from head to toe regularly.  Tell your health care provider about any new moles or changes in moles, especially if there is a change in a mole's shape or color.  Also tell your health care provider if you have a mole that is larger than the size of a pencil eraser.  Always use sunscreen. Apply sunscreen liberally and repeatedly throughout the day.  Protect yourself  by wearing long sleeves, pants, a wide-brimmed hat, and sunglasses whenever you are outside.  Heart disease, diabetes, and high blood pressure  High blood pressure causes heart disease and increases the risk of stroke. High blood pressure is more likely to develop in: ? People who have blood pressure in the high end of the normal range (130-139/85-89 mm Hg). ? People who are overweight or obese. ? People who are African American.  If you are 11-31 years of age, have your blood pressure checked every 3-5 years. If you are 15 years of age or older, have your blood pressure checked every year. You should have your blood pressure measured twice-once when you are at a hospital or clinic, and once when you are not at a hospital or clinic. Record the average of the two measurements. To check your blood pressure when you are not at a hospital or clinic, you can use: ? An automated blood pressure machine at a pharmacy. ? A home blood pressure monitor.  If you are between 27 years and 48 years old, ask your health care provider  if you should take aspirin to prevent strokes.  Have regular diabetes screenings. This involves taking a blood sample to check your fasting blood sugar level. ? If you are at a normal weight and have a low risk for diabetes, have this test once every three years after 66 years of age. ? If you are overweight and have a high risk for diabetes, consider being tested at a younger age or more often. Preventing infection Hepatitis B  If you have a higher risk for hepatitis B, you should be screened for this virus. You are considered at high risk for hepatitis B if: ? You were born in a country where hepatitis B is common. Ask your health care provider which countries are considered high risk. ? Your parents were born in a high-risk country, and you have not been immunized against hepatitis B (hepatitis B vaccine). ? You have HIV or AIDS. ? You use needles to inject street drugs. ? You live with someone who has hepatitis B. ? You have had sex with someone who has hepatitis B. ? You get hemodialysis treatment. ? You take certain medicines for conditions, including cancer, organ transplantation, and autoimmune conditions.  Hepatitis C  Blood testing is recommended for: ? Everyone born from [birth year] through 1965. ? Anyone with known risk factors for hepatitis C.  Sexually transmitted infections (STIs)  You should be screened for sexually transmitted infections (STIs) including gonorrhea and chlamydia if: ? You are sexually active and are younger than 67 years of age. ? You are older than 67 years of age and your health care provider tells you that you are at risk for this type of infection. ? Your sexual activity has changed since you were last screened and you are at an increased risk for chlamydia or gonorrhea. Ask your health care provider if you are at risk.  If you do not have HIV, but are at risk, it may be recommended that you take a prescription medicine daily to prevent HIV infection. This  is called pre-exposure prophylaxis (PrEP). You are considered at risk if: ? You are sexually active and do not regularly use condoms or know the HIV status of your partner(s). ? You take drugs by injection. ? You are sexually active with a partner who has HIV.  Talk with your health care provider about whether you are at high risk of being infected  with HIV. If you choose to begin PrEP, you should first be tested for HIV. You should then be tested every 3 months for as long as you are taking PrEP. Pregnancy  If you are premenopausal and you may become pregnant, ask your health care provider about preconception counseling.  If you may become pregnant, take 400 to 800 micrograms (mcg) of folic acid every day.  If you want to prevent pregnancy, talk to your health care provider about birth control (contraception). Osteoporosis and menopause  Osteoporosis is a disease in which the bones lose minerals and strength with aging. This can result in serious bone fractures. Your risk for osteoporosis can be identified using a bone density scan.  If you are 49 years of age or older, or if you are at risk for osteoporosis and fractures, ask your health care provider if you should be screened.  Ask your health care provider whether you should take a calcium or vitamin D supplement to lower your risk for osteoporosis.  Menopause may have certain physical symptoms and risks.  Hormone replacement therapy may reduce some of these symptoms and risks. Talk to your health care provider about whether hormone replacement therapy is right for you. Follow these instructions at home:  Schedule regular health, dental, and eye exams.  Stay current with your immunizations.  Do not use any tobacco products including cigarettes, chewing tobacco, or electronic cigarettes.  If you are pregnant, do not drink alcohol.  If you are breastfeeding, limit how much and how often you drink alcohol.  Limit alcohol intake  to no more than 1 drink per day for nonpregnant women. One drink equals 12 ounces of beer, 5 ounces of wine, or 1 ounces of hard liquor.  Do not use street drugs.  Do not share needles.  Ask your health care provider for help if you need support or information about quitting drugs.  Tell your health care provider if you often feel depressed.  Tell your health care provider if you have ever been abused or do not feel safe at home. This information is not intended to replace advice given to you by your health care provider. Make sure you discuss any questions you have with your health care provider. Document Released: 09/01/2010 Document Revised: 07/25/2015 Document Reviewed: 11/20/2014 Elsevier Interactive Patient Education  [employer].

## 2017-10-21 ENCOUNTER — Other Ambulatory Visit: Payer: Self-pay | Admitting: Internal Medicine

## 2017-10-21 DIAGNOSIS — E785 Hyperlipidemia, unspecified: Secondary | ICD-10-CM

## 2017-12-13 ENCOUNTER — Other Ambulatory Visit: Payer: Self-pay

## 2017-12-13 ENCOUNTER — Inpatient Hospital Stay: Payer: Medicare Other

## 2017-12-13 ENCOUNTER — Encounter: Payer: Self-pay | Admitting: Hematology & Oncology

## 2017-12-13 ENCOUNTER — Inpatient Hospital Stay: Payer: Medicare Other | Attending: Hematology & Oncology | Admitting: Hematology & Oncology

## 2017-12-13 VITALS — BP 144/74 | HR 81 | Temp 97.5°F | Resp 18 | Wt 144.0 lb

## 2017-12-13 DIAGNOSIS — D473 Essential (hemorrhagic) thrombocythemia: Secondary | ICD-10-CM

## 2017-12-13 DIAGNOSIS — D500 Iron deficiency anemia secondary to blood loss (chronic): Secondary | ICD-10-CM

## 2017-12-13 DIAGNOSIS — D509 Iron deficiency anemia, unspecified: Secondary | ICD-10-CM

## 2017-12-13 DIAGNOSIS — D649 Anemia, unspecified: Secondary | ICD-10-CM | POA: Diagnosis not present

## 2017-12-13 LAB — CBC WITH DIFFERENTIAL (CANCER CENTER ONLY)
ABS IMMATURE GRANULOCYTES: 0.06 10*3/uL (ref 0.00–0.07)
BASOS PCT: 1 %
Basophils Absolute: 0.1 10*3/uL (ref 0.0–0.1)
Eosinophils Absolute: 0.2 10*3/uL (ref 0.0–0.5)
Eosinophils Relative: 2 %
HCT: 27.9 % — ABNORMAL LOW (ref 36.0–46.0)
HEMOGLOBIN: 8.3 g/dL — AB (ref 12.0–15.0)
Immature Granulocytes: 1 %
LYMPHS PCT: 25 %
Lymphs Abs: 2 10*3/uL (ref 0.7–4.0)
MCH: 24.1 pg — ABNORMAL LOW (ref 26.0–34.0)
MCHC: 29.7 g/dL — ABNORMAL LOW (ref 30.0–36.0)
MCV: 80.9 fL (ref 80.0–100.0)
MONOS PCT: 9 %
Monocytes Absolute: 0.7 10*3/uL (ref 0.1–1.0)
NEUTROS PCT: 62 %
Neutro Abs: 5 10*3/uL (ref 1.7–7.7)
PLATELETS: 722 10*3/uL — AB (ref 150–400)
RBC: 3.45 MIL/uL — AB (ref 3.87–5.11)
RDW: 19.6 % — ABNORMAL HIGH (ref 11.5–15.5)
WBC: 8.1 10*3/uL (ref 4.0–10.5)
nRBC: 0 % (ref 0.0–0.2)

## 2017-12-13 LAB — CMP (CANCER CENTER ONLY)
ALT: 16 U/L (ref 0–44)
AST: 22 U/L (ref 15–41)
Albumin: 3.7 g/dL (ref 3.5–5.0)
Alkaline Phosphatase: 95 U/L (ref 38–126)
Anion gap: 10 (ref 5–15)
BUN: 28 mg/dL — AB (ref 8–23)
CHLORIDE: 105 mmol/L (ref 98–111)
CO2: 26 mmol/L (ref 22–32)
CREATININE: 1.02 mg/dL — AB (ref 0.44–1.00)
Calcium: 9.5 mg/dL (ref 8.9–10.3)
GFR, Est AFR Am: >60 mL/min (ref >60)
GFR, Estimated: 56 mL/min — ABNORMAL LOW (ref >60)
Glucose, Bld: 108 mg/dL — ABNORMAL HIGH (ref 70–99)
Potassium: 3.2 mmol/L — ABNORMAL LOW (ref 3.5–5.1)
SODIUM: 141 mmol/L (ref 135–145)
Total Bilirubin: 0.5 mg/dL (ref 0.3–1.2)
Total Protein: 7.7 g/dL (ref 6.5–8.1)

## 2017-12-13 LAB — LACTATE DEHYDROGENASE: LDH: 282 U/L — ABNORMAL HIGH (ref 98–192)

## 2017-12-13 LAB — SAVE SMEAR(SSMR), FOR PROVIDER SLIDE REVIEW

## 2017-12-13 MED ORDER — ANAGRELIDE HCL 1 MG PO CAPS: ORAL_CAPSULE | ORAL | 6 refills | Status: DC

## 2017-12-13 NOTE — Progress Notes (Signed)
Hematology and Oncology Follow Up Visit  Alexandra [name] [id number] [birth year] 67 y.o. 12/13/2017   Principle Diagnosis:   Anagrelide 5 mg by mouth daily - dose changed today  Aspirin 81 mg by mouth daily  Folic acid 2 mg by mouth daily   Current Therapy:    Essential thrombocythemia-Calreticulin positive      Interim History:  [name] is back for followup.  She feels okay.  She really has had no complaints since we last saw her couple months ago.  As always, she is busy with her retirement.  She has had no bleeding.  She is on anagrelide.  She is had no problems with the anagrelide.  She is had no rashes.  She is had no leg swelling.  She is had no cough or shortness of breath..  She is had no change in bowel or bladder habits.  Her iron studies that we did back in August showed a ferritin of 222 with an iron saturation of 32%.  Overall, her performance status is ECOG 0.  Medications:  Current Outpatient Medications:  .  amLODipine (NORVASC) 10 MG tablet, Take 1 tablet (10 mg total) by mouth daily., Disp: 90 tablet, Rfl: 1 .  anagrelide (AGRYLIN) 1 MG capsule, TAKE 4 CAPSULES BY MOUTH TWICE A DAY., Disp: 150 capsule, Rfl: 6 .  aspirin 81 MG tablet, Take 81 mg by mouth daily., Disp: , Rfl:  .  BYSTOLIC 20 MG TABS, TAKE 1 TABLET BY MOUTH ONCE DAILY, Disp: 90 tablet, Rfl: 0 .  Fe Fum-FePoly-Vit C-Vit B3 (INTEGRA) 62.5-62.5-40-3 MG CAPS, Take 1 tablet by mouth daily., Disp: 30 capsule, Rfl: 8 .  folic acid (FOLVITE) 1 MG tablet, Take 2 tablets (2 mg total) by mouth daily. (Patient taking differently: Take 2 mg by mouth daily. 12.4.18-pt taking 1 mg daily), Disp: 180 tablet, Rfl: 3 .  glucose blood (ONE TOUCH ULTRA TEST) test strip, Use to test blood sugar 1 times daily. **PT NEEDS FOLLOW UP APPT FOR FURTHER REFILLS**, Disp: 100 each, Rfl: 0 .  lisinopril (PRINIVIL,ZESTRIL) 20 MG tablet, TAKE 2 TABLETS BY MOUTH ONCE DAILY, Disp: 180 tablet, Rfl: 1 .  Nebivolol HCl (BYSTOLIC) 20  MG TABS, Take 1 tablet (20 mg total) by mouth daily., Disp: 90 tablet, Rfl: 1 .  ONETOUCH DELICA LANCETS 89F MISC, Use to test blood sugar 1 time daily. **PT NEEDS FOLLOW UP APPT FOR FURTHER REFILLS**, Disp: 100 each, Rfl: 1 .  Vitamin D, Ergocalciferol, (DRISDOL) 50000 units CAPS capsule, TAKE 1 CAPSULE BY MOUTH 1 TIME A WEEK, Disp: 12 capsule, Rfl: 0  Allergies: No Known Allergies  Past Medical History, Surgical history, Social history, and Family History were reviewed and updated.  Review of Systems: Review of Systems  All other systems reviewed and are negative. Marland Kitchen  Physical Exam:  weight is 144 lb (65.3 kg). Her oral temperature is 97.5 F (36.4 C) (abnormal). Her blood pressure is 144/74 (abnormal) and her pulse is 81. Her respiration is 18 and oxygen saturation is 100%.   Physical Exam  Constitutional: She is oriented to person, place, and time.  HENT:  Head: Normocephalic and atraumatic.  Mouth/Throat: Oropharynx is clear and moist.  Eyes: Pupils are equal, round, and reactive to light. EOM are normal.  Neck: Normal range of motion.  Cardiovascular: Normal rate, regular rhythm and normal heart sounds.  Pulmonary/Chest: Effort normal and breath sounds normal.  Abdominal: Soft. Bowel sounds are normal.  Musculoskeletal: Normal range of motion. She  exhibits no edema, tenderness or deformity.  Lymphadenopathy:    She has no cervical adenopathy.  Neurological: She is alert and oriented to person, place, and time.  Skin: Skin is warm and dry. No rash noted. No erythema.  Psychiatric: She has a normal mood and affect. Her behavior is normal. Judgment and thought content normal.  Vitals reviewed.    Lab Results  Component Value Date   WBC 8.1 12/13/2017   HGB 8.3 (L) 12/13/2017   HCT 27.9 (L) 12/13/2017   MCV 80.9 12/13/2017   PLT 722 (H) 12/13/2017     Chemistry      Component Value Date/Time   NA 139 10/11/2017 1018   NA 144 02/02/2017 1158   NA 140 07/24/2016 1007    K 3.4 (L) 10/11/2017 1018   K 3.7 02/02/2017 1158   K 3.6 07/24/2016 1007   CL 104 10/11/2017 1018   CL 103 02/02/2017 1158   CO2 25 10/11/2017 1018   CO2 29 02/02/2017 1158   CO2 26 07/24/2016 1007   BUN 19 10/11/2017 1018   BUN 20 02/02/2017 1158   BUN 30.3 (H) 07/24/2016 1007   CREATININE 0.85 10/11/2017 1018   CREATININE 0.9 02/02/2017 1158   CREATININE 1.1 07/24/2016 1007   GLU 129 02/02/2017      Component Value Date/Time   CALCIUM 9.3 10/11/2017 1018   CALCIUM 9.0 02/02/2017 1158   CALCIUM 9.3 07/24/2016 1007   ALKPHOS 98 10/11/2017 1018   ALKPHOS 110 (H) 02/02/2017 1158   ALKPHOS 104 07/24/2016 1007   AST 22 10/11/2017 1018   AST 13 07/24/2016 1007   ALT 19 10/11/2017 1018   ALT 26 02/02/2017 1158   ALT 15 07/24/2016 1007   BILITOT 0.7 10/11/2017 1018   BILITOT 0.55 07/24/2016 1007      Impression and Plan: Alexandra [name] is 67 year old [name]. She has essential thrombocythemia. She actually is Calreticulin positive.  I really think that we are getting to the point that we are going to have to do another bone marrow biopsy on her.  I talked to her about this.  She really does not want to do one until after the holidays.  I told her that it would be a lot better if he had one done sooner so we could see exactly what her bone marrow is doing and this would allow [place] to may be recommend a more helpful intervention.  She says that she feels good.  She just will not do a bone marrow biopsy until after New Year's.  I will have to increase her anagrelide up to 4 mg p.o. twice daily.  I think this will get her platelet count back down.  I am not sure why she is so anemic outside of the fact that her bone marrow is becoming fibrotic possibly.  I do want to do another ultrasound on her abdomen.  I would like to see what her spleen looks like.  We will get this set up when we see her back.  I will see her back in early December. [name],  MD 10/14/201912:49 PM

## 2017-12-14 LAB — IRON AND TIBC
Iron: 57 ug/dL (ref 41–142)
Saturation Ratios: 26 % (ref 21–57)
TIBC: 218 ug/dL — ABNORMAL LOW (ref 236–444)
UIBC: 161 ug/dL

## 2017-12-14 LAB — FERRITIN: Ferritin: 238 ng/mL (ref 11–307)

## 2017-12-22 ENCOUNTER — Other Ambulatory Visit: Payer: Self-pay | Admitting: Internal Medicine

## 2018-01-13 ENCOUNTER — Other Ambulatory Visit: Payer: Self-pay | Admitting: Family

## 2018-02-04 ENCOUNTER — Other Ambulatory Visit: Payer: Self-pay | Admitting: Internal Medicine

## 2018-02-07 ENCOUNTER — Inpatient Hospital Stay: Payer: Medicare Other

## 2018-02-07 ENCOUNTER — Ambulatory Visit (HOSPITAL_BASED_OUTPATIENT_CLINIC_OR_DEPARTMENT_OTHER)
Admission: RE | Admit: 2018-02-07 | Discharge: 2018-02-07 | Disposition: A | Discharge status: Home / Self Care | Payer: Medicare Other | Source: Ambulatory Visit | Attending: Hematology & Oncology | Admitting: Hematology & Oncology

## 2018-02-07 ENCOUNTER — Inpatient Hospital Stay: Payer: Medicare Other | Attending: Hematology & Oncology | Admitting: Hematology & Oncology

## 2018-02-07 ENCOUNTER — Other Ambulatory Visit: Payer: Self-pay

## 2018-02-07 VITALS — BP 146/81 | HR 81 | Temp 97.9°F | Resp 20 | Wt 138.8 lb

## 2018-02-07 DIAGNOSIS — N2889 Other specified disorders of kidney and ureter: Secondary | ICD-10-CM | POA: Insufficient documentation

## 2018-02-07 DIAGNOSIS — D1771 Benign lipomatous neoplasm of kidney: Secondary | ICD-10-CM | POA: Diagnosis not present

## 2018-02-07 DIAGNOSIS — Z7982 Long term (current) use of aspirin: Secondary | ICD-10-CM | POA: Insufficient documentation

## 2018-02-07 DIAGNOSIS — D500 Iron deficiency anemia secondary to blood loss (chronic): Secondary | ICD-10-CM

## 2018-02-07 DIAGNOSIS — D473 Essential (hemorrhagic) thrombocythemia: Secondary | ICD-10-CM

## 2018-02-07 DIAGNOSIS — K760 Fatty (change of) liver, not elsewhere classified: Secondary | ICD-10-CM | POA: Insufficient documentation

## 2018-02-07 DIAGNOSIS — Z79899 Other long term (current) drug therapy: Secondary | ICD-10-CM | POA: Diagnosis not present

## 2018-02-07 LAB — CMP (CANCER CENTER ONLY)
ALBUMIN: 4.1 g/dL (ref 3.5–5.0)
ALT: 13 U/L (ref 0–44)
AST: 13 U/L — ABNORMAL LOW (ref 15–41)
Alkaline Phosphatase: 88 U/L (ref 38–126)
Anion gap: 10 (ref 5–15)
BUN: 26 mg/dL — ABNORMAL HIGH (ref 8–23)
CO2: 28 mmol/L (ref 22–32)
Calcium: 9.3 mg/dL (ref 8.9–10.3)
Chloride: 102 mmol/L (ref 98–111)
Creatinine: 1.37 mg/dL — ABNORMAL HIGH (ref 0.44–1.00)
GFR, Est AFR Am: 46 mL/min — ABNORMAL LOW (ref >60)
GFR, Estimated: 40 mL/min — ABNORMAL LOW (ref >60)
Glucose, Bld: 120 mg/dL — ABNORMAL HIGH (ref 70–99)
Potassium: 3 mmol/L — ABNORMAL LOW (ref 3.5–5.1)
Sodium: 140 mmol/L (ref 135–145)
Total Bilirubin: 0.5 mg/dL (ref 0.3–1.2)
Total Protein: 7.2 g/dL (ref 6.5–8.1)

## 2018-02-07 LAB — CBC WITH DIFFERENTIAL (CANCER CENTER ONLY)
Abs Immature Granulocytes: 0.06 10*3/uL (ref 0.00–0.07)
Basophils Absolute: 0 10*3/uL (ref 0.0–0.1)
Basophils Relative: 1 %
Eosinophils Absolute: 0.1 10*3/uL (ref 0.0–0.5)
Eosinophils Relative: 1 %
HCT: 27 % — ABNORMAL LOW (ref 36.0–46.0)
Hemoglobin: 7.9 g/dL — ABNORMAL LOW (ref 12.0–15.0)
IMMATURE GRANULOCYTES: 1 %
Lymphocytes Relative: 22 %
Lymphs Abs: 1.5 10*3/uL (ref 0.7–4.0)
MCH: 24.1 pg — ABNORMAL LOW (ref 26.0–34.0)
MCHC: 29.3 g/dL — ABNORMAL LOW (ref 30.0–36.0)
MCV: 82.3 fL (ref 80.0–100.0)
Monocytes Absolute: 0.5 10*3/uL (ref 0.1–1.0)
Monocytes Relative: 8 %
NEUTROS ABS: 4.7 10*3/uL (ref 1.7–7.7)
Neutrophils Relative %: 67 %
Platelet Count: 283 10*3/uL (ref 150–400)
RBC: 3.28 MIL/uL — ABNORMAL LOW (ref 3.87–5.11)
RDW: 19.3 % — ABNORMAL HIGH (ref 11.5–15.5)
WBC Count: 6.7 10*3/uL (ref 4.0–10.5)
nRBC: 0 % (ref 0.0–0.2)

## 2018-02-07 LAB — SAMPLE TO BLOOD BANK

## 2018-02-07 LAB — SAVE SMEAR (SSMR)

## 2018-02-07 LAB — LACTATE DEHYDROGENASE: LDH: 242 U/L — ABNORMAL HIGH (ref 98–192)

## 2018-02-07 NOTE — Progress Notes (Signed)
[name]  It is a [employer].  Hematology and Oncology Follow Up Visit  CHELSAE ZANELLA [id number] 05/28/1950 67 y.o. 02/07/2018   Principle Diagnosis:   Anagrelide 3 mg po BID - dose changed 02/07/2018  Aspirin 81 mg by mouth daily  Folic acid 2 mg by mouth daily   Current Therapy:    Essential thrombocythemia-Calreticulin positive  Interim History:  Ms.  Filice is back for followup.  She actually has responded to the increased dose of anagrelide.  I actually put her up to 4 mg p.o. twice daily.  Her platelet count went down to 283,000.  Her white cell count is still holding steady.  Her hemoglobin has dropped a little bit.  There is no obvious iron deficiency.  Hopefully, we will be able to decrease the anagrelide dose to 3 mg p.o. twice daily.  I thought about the possibility of doing another bone marrow test on her.  I think that we can hold off on this for right now.  She is planning on a wonderful trip down to [facility] for Christmas.  She will be leaving on 23 December.  She will stop off and on divert, [place] to pick up her sister.  They will be going down to [place], [place] for 3 or 4 days then coming back home.  She has had no issues with cough.  Her appetite is down a little bit.  She does have a little bit of fatigue and some decreased energy because of the increased dose of anagrelide.  She has had no bleeding.  She is had no fever.  She is had no change in bowel or bladder habits.  Her iron studies that were done in October showed a ferritin of 238 with an iron saturation of 26%.  Overall, her performance status is ECOG 1.  Medications:  Current Outpatient Medications:  .  amLODipine (NORVASC) 10 MG tablet, TAKE 1 TABLET BY MOUTH ONCE DAILY, Disp: 90 tablet, Rfl: 0 .  anagrelide (AGRYLIN) 1 MG capsule, TAKE 4 CAPSULES BY MOUTH TWICE A DAY., Disp: 150 capsule, Rfl: 6 .  aspirin 81 MG tablet, Take 81 mg by mouth daily., Disp: , Rfl:   .  BYSTOLIC 20 MG TABS, TAKE 1 TABLET BY MOUTH ONCE DAILY, Disp: 90 tablet, Rfl: 0 .  Fe Fum-FePoly-Vit C-Vit B3 (INTEGRA) 62.5-62.5-40-3 MG CAPS, Take 1 tablet by mouth daily., Disp: 30 capsule, Rfl: 8 .  folic acid (FOLVITE) 1 MG tablet, Take 2 tablets (2 mg total) by mouth daily. (Patient taking differently: Take 2 mg by mouth daily. 12.4.18-pt taking 1 mg daily), Disp: 180 tablet, Rfl: 3 .  glucose blood (ONE TOUCH ULTRA TEST) test strip, Use to test blood sugar 1 times daily. **PT NEEDS FOLLOW UP APPT FOR FURTHER REFILLS**, Disp: 100 each, Rfl: 0 .  lisinopril (PRINIVIL,ZESTRIL) 20 MG tablet, TAKE 2 TABLETS BY MOUTH ONCE DAILY, Disp: 180 tablet, Rfl: 0 .  ONETOUCH DELICA LANCETS 09Q MISC, Use to test blood sugar 1 time daily. **PT NEEDS FOLLOW UP APPT FOR FURTHER REFILLS**, Disp: 100 each, Rfl: 1 .  Vitamin D, Ergocalciferol, (DRISDOL) 1.25 MG (50000 UT) CAPS capsule, TAKE 1 CAPSULE BY MOUTH 1 TIME A WEEK, Disp: 12 capsule, Rfl: 0  Allergies: No Known Allergies  Past Medical History, Surgical history, Social history, and Family History were reviewed and updated.  Review of Systems: Review of Systems  All other systems reviewed and are negative. Marland Kitchen  Physical Exam:  weight is  138 lb 12 oz (62.9 kg). Her oral temperature is 97.9 F (36.6 C). Her blood pressure is 146/81 (abnormal) and her pulse is 81. Her respiration is 20 and oxygen saturation is 100%.   Physical Exam  Constitutional: She is oriented to person, place, and time.  HENT:  Head: Normocephalic and atraumatic.  Mouth/Throat: Oropharynx is clear and moist.  Eyes: Pupils are equal, round, and reactive to light. EOM are normal.  Neck: Normal range of motion.  Cardiovascular: Normal rate, regular rhythm and normal heart sounds.  Pulmonary/Chest: Effort normal and breath sounds normal.  Abdominal: Soft. Bowel sounds are normal.  Musculoskeletal: Normal range of motion. She exhibits no edema, tenderness or deformity.   Lymphadenopathy:    She has no cervical adenopathy.  Neurological: She is alert and oriented to person, place, and time.  Skin: Skin is warm and dry. No rash noted. No erythema.  Psychiatric: She has a normal mood and affect. Her behavior is normal. Judgment and thought content normal.  Vitals reviewed.    Lab Results  Component Value Date   WBC 6.7 02/07/2018   HGB 7.9 (L) 02/07/2018   HCT 27.0 (L) 02/07/2018   MCV 82.3 02/07/2018   PLT 283 02/07/2018     Chemistry      Component Value Date/Time   NA 140 02/07/2018 1000   NA 144 02/02/2017 1158   NA 140 07/24/2016 1007   K 3.0 (L) 02/07/2018 1000   K 3.7 02/02/2017 1158   K 3.6 07/24/2016 1007   CL 102 02/07/2018 1000   CL 103 02/02/2017 1158   CO2 28 02/07/2018 1000   CO2 29 02/02/2017 1158   CO2 26 07/24/2016 1007   BUN 26 (H) 02/07/2018 1000   BUN 20 02/02/2017 1158   BUN 30.3 (H) 07/24/2016 1007   CREATININE 1.37 (H) 02/07/2018 1000   CREATININE 0.9 02/02/2017 1158   CREATININE 1.1 07/24/2016 1007   GLU 129 02/02/2017      Component Value Date/Time   CALCIUM 9.3 02/07/2018 1000   CALCIUM 9.0 02/02/2017 1158   CALCIUM 9.3 07/24/2016 1007   ALKPHOS 88 02/07/2018 1000   ALKPHOS 110 (H) 02/02/2017 1158   ALKPHOS 104 07/24/2016 1007   AST 13 (L) 02/07/2018 1000   AST 13 07/24/2016 1007   ALT 13 02/07/2018 1000   ALT 26 02/02/2017 1158   ALT 15 07/24/2016 1007   BILITOT 0.5 02/07/2018 1000   BILITOT 0.55 07/24/2016 1007      Impression and Plan: Ms. Ehler is 67 year old [name]. She has essential thrombocythemia. She actually is Calreticulin positive.  Hopefully, her platelet count will not bump up too much with the decrease in anagrelide dose.  I forgot to mention that we did do an abdominal ultrasound today.  The abdominal ultrasound did not show any splenomegaly.  She did have some evidence of hepatic steatosis.  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  She had a 9 mm lesion in the left kidney which also was felt to be an angiomyolipoma.    I would like to see her back in 6 weeks.  We will hopefully see her platelet count stabilizing.  We will see her hemoglobin improving.  I am also going to send off a NGS panel for MPN to see if we might see any type of genetic mutations.   [name], MD 12/9/201911:14 AM

## 2018-02-08 LAB — IRON AND TIBC
IRON: 69 ug/dL (ref 41–142)
Saturation Ratios: 32 % (ref 21–57)
TIBC: 214 ug/dL — ABNORMAL LOW (ref 236–444)
UIBC: 145 ug/dL (ref 120–384)

## 2018-02-08 LAB — FERRITIN: Ferritin: 301 ng/mL (ref 11–307)

## 2018-03-21 ENCOUNTER — Ambulatory Visit: Payer: Medicare Other | Admitting: Internal Medicine

## 2018-03-28 ENCOUNTER — Inpatient Hospital Stay: Payer: Medicare Other | Attending: Hematology & Oncology | Admitting: Hematology & Oncology

## 2018-03-28 ENCOUNTER — Other Ambulatory Visit: Payer: Self-pay | Admitting: Family

## 2018-03-28 ENCOUNTER — Other Ambulatory Visit: Payer: Self-pay

## 2018-03-28 ENCOUNTER — Encounter: Payer: Self-pay | Admitting: Hematology & Oncology

## 2018-03-28 ENCOUNTER — Inpatient Hospital Stay: Payer: Medicare Other

## 2018-03-28 VITALS — BP 113/58 | HR 108 | Temp 98.1°F | Resp 18 | Wt 139.0 lb

## 2018-03-28 DIAGNOSIS — Z7982 Long term (current) use of aspirin: Secondary | ICD-10-CM

## 2018-03-28 DIAGNOSIS — D508 Other iron deficiency anemias: Secondary | ICD-10-CM

## 2018-03-28 DIAGNOSIS — D469 Myelodysplastic syndrome, unspecified: Secondary | ICD-10-CM

## 2018-03-28 DIAGNOSIS — Z79899 Other long term (current) drug therapy: Secondary | ICD-10-CM | POA: Diagnosis not present

## 2018-03-28 DIAGNOSIS — D473 Essential (hemorrhagic) thrombocythemia: Secondary | ICD-10-CM

## 2018-03-28 LAB — CBC WITH DIFFERENTIAL (CANCER CENTER ONLY)
Abs Immature Granulocytes: 0.09 10*3/uL — ABNORMAL HIGH (ref 0.00–0.07)
Basophils Absolute: 0.1 10*3/uL (ref 0.0–0.1)
Basophils Relative: 1 %
Eosinophils Absolute: 0.1 10*3/uL (ref 0.0–0.5)
Eosinophils Relative: 2 %
HCT: 26 % — ABNORMAL LOW (ref 36.0–46.0)
Hemoglobin: 7.9 g/dL — ABNORMAL LOW (ref 12.0–15.0)
Immature Granulocytes: 1 %
Lymphocytes Relative: 24 %
Lymphs Abs: 2.1 10*3/uL (ref 0.7–4.0)
MCH: 25 pg — ABNORMAL LOW (ref 26.0–34.0)
MCHC: 30.4 g/dL (ref 30.0–36.0)
MCV: 82.3 fL (ref 80.0–100.0)
Monocytes Absolute: 0.8 10*3/uL (ref 0.1–1.0)
Monocytes Relative: 9 %
Neutro Abs: 5.6 10*3/uL (ref 1.7–7.7)
Neutrophils Relative %: 63 %
Platelet Count: 517 10*3/uL — ABNORMAL HIGH (ref 150–400)
RBC: 3.16 MIL/uL — AB (ref 3.87–5.11)
RDW: 20 % — AB (ref 11.5–15.5)
WBC Count: 8.7 10*3/uL (ref 4.0–10.5)
nRBC: 0 % (ref 0.0–0.2)

## 2018-03-28 LAB — RETICULOCYTES
Immature Retic Fract: 17.6 % — ABNORMAL HIGH (ref 2.3–15.9)
RBC.: 3.16 MIL/uL — ABNORMAL LOW (ref 3.87–5.11)
Retic Count, Absolute: 42 10*3/uL (ref 19.0–186.0)
Retic Ct Pct: 1.3 % (ref 0.4–3.1)

## 2018-03-28 LAB — CMP (CANCER CENTER ONLY)
ALT: 10 U/L (ref 0–44)
AST: 11 U/L — ABNORMAL LOW (ref 15–41)
Albumin: 4.2 g/dL (ref 3.5–5.0)
Alkaline Phosphatase: 74 U/L (ref 38–126)
Anion gap: 7 (ref 5–15)
BILIRUBIN TOTAL: 0.5 mg/dL (ref 0.3–1.2)
BUN: 38 mg/dL — ABNORMAL HIGH (ref 8–23)
CALCIUM: 9.8 mg/dL (ref 8.9–10.3)
CO2: 28 mmol/L (ref 22–32)
Chloride: 102 mmol/L (ref 98–111)
Creatinine: 1.38 mg/dL — ABNORMAL HIGH (ref 0.44–1.00)
GFR, Est AFR Am: 46 mL/min — ABNORMAL LOW (ref >60)
GFR, Estimated: 39 mL/min — ABNORMAL LOW (ref >60)
Glucose, Bld: 220 mg/dL — ABNORMAL HIGH (ref 70–99)
Potassium: 3.9 mmol/L (ref 3.5–5.1)
Sodium: 137 mmol/L (ref 135–145)
Total Protein: 7.3 g/dL (ref 6.5–8.1)

## 2018-03-28 LAB — SAVE SMEAR(SSMR), FOR PROVIDER SLIDE REVIEW

## 2018-03-28 NOTE — Progress Notes (Signed)
[name]  [employer] is [employer].  Hematology and Oncology Follow Up Visit  Alexandra [name] [id number] [birth year] 68 y.o. 03/28/2018   Principle Diagnosis:   Anagrelide 7 mg po q day - dose changed 03/28/2018  Aspirin 81 mg by mouth daily  Folic acid 2 mg by mouth daily   Current Therapy:    Essential thrombocythemia-Calreticulin positive  Interim History:  Ms.  Hamelin is back for followup.  She is back now.  She went through the holidays.  She wanted make sure she got through the holidays until she saw [place] back.  Her platelet count is now back up.  It went up to 517,000.  She is still quite anemic with a hemoglobin of 7.9.  We decreased her anagrelide dose from 8 mg down to 6 mg p.o. daily.  I will increase her back up to 7 mg p.o. daily.  She enjoyed the holidays.  She was down in [place].  She was also down in [place] recently.  She is had no cough.  She has had no fatigue.  She is had no fever.  There is been no rash.  She has had no change in bowel or bladder habits.  She has had a good appetite.  Overall, her performance status is ECOG 1.  Medications:  Current Outpatient Medications:  .  amLODipine (NORVASC) 10 MG tablet, TAKE 1 TABLET BY MOUTH ONCE DAILY, Disp: 90 tablet, Rfl: 0 .  anagrelide (AGRYLIN) 1 MG capsule, TAKE 4 CAPSULES BY MOUTH TWICE A DAY. (Patient taking differently: TAKE 6  CAPSULES BY MOUTH TWICE A DAY.), Disp: 150 capsule, Rfl: 6 .  aspirin 81 MG tablet, Take 81 mg by mouth daily., Disp: , Rfl:  .  BYSTOLIC 20 MG TABS, TAKE 1 TABLET BY MOUTH ONCE DAILY, Disp: 90 tablet, Rfl: 0 .  Fe Fum-FePoly-Vit C-Vit B3 (INTEGRA) 62.5-62.5-40-3 MG CAPS, Take 1 tablet by mouth daily., Disp: 30 capsule, Rfl: 8 .  folic acid (FOLVITE) 1 MG tablet, Take 2 tablets (2 mg total) by mouth daily. (Patient taking differently: Take 2 mg by mouth daily. 12.4.18-pt taking 1 mg daily), Disp: 180 tablet, Rfl: 3 .  glucose blood (ONE TOUCH ULTRA TEST) test strip, Use to  test blood sugar 1 times daily. **PT NEEDS FOLLOW UP APPT FOR FURTHER REFILLS**, Disp: 100 each, Rfl: 0 .  lisinopril (PRINIVIL,ZESTRIL) 20 MG tablet, TAKE 2 TABLETS BY MOUTH ONCE DAILY, Disp: 180 tablet, Rfl: 0 .  ONETOUCH DELICA LANCETS 87F MISC, Use to test blood sugar 1 time daily. **PT NEEDS FOLLOW UP APPT FOR FURTHER REFILLS**, Disp: 100 each, Rfl: 1 .  Vitamin D, Ergocalciferol, (DRISDOL) 1.25 MG (50000 UT) CAPS capsule, TAKE 1 CAPSULE BY MOUTH 1 TIME A WEEK, Disp: 12 capsule, Rfl: 0  Allergies: No Known Allergies  Past Medical History, Surgical history, Social history, and Family History were reviewed and updated.  Review of Systems: Review of Systems  All other systems reviewed and are negative. Marland Kitchen  Physical Exam:  weight is 139 lb (63 kg). Her oral temperature is 98.1 F (36.7 C). Her blood pressure is 113/58 (abnormal) and her pulse is 108 (abnormal). Her respiration is 18 and oxygen saturation is 100%.   Physical Exam Vitals signs reviewed.  HENT:     Head: Normocephalic and atraumatic.  Eyes:     Pupils: Pupils are equal, round, and reactive to light.  Neck:     Musculoskeletal: Normal range of motion.  Cardiovascular:  Rate and Rhythm: Normal rate and regular rhythm.     Heart sounds: Normal heart sounds.  Pulmonary:     Effort: Pulmonary effort is normal.     Breath sounds: Normal breath sounds.  Abdominal:     General: Bowel sounds are normal.     Palpations: Abdomen is soft.  Musculoskeletal: Normal range of motion.        General: No tenderness or deformity.  Lymphadenopathy:     Cervical: No cervical adenopathy.  Skin:    General: Skin is warm and dry.     Findings: No erythema or rash.  Neurological:     Mental Status: She is alert and oriented to person, place, and time.  Psychiatric:        Behavior: Behavior normal.        Thought Content: Thought content normal.        Judgment: Judgment normal.      Lab Results  Component Value Date    WBC 8.7 03/28/2018   HGB 7.9 (L) 03/28/2018   HCT 26.0 (L) 03/28/2018   MCV 82.3 03/28/2018   PLT 517 (H) 03/28/2018     Chemistry      Component Value Date/Time   NA 137 03/28/2018 1107   NA 144 02/02/2017 1158   NA 140 07/24/2016 1007   K 3.9 03/28/2018 1107   K 3.7 02/02/2017 1158   K 3.6 07/24/2016 1007   CL 102 03/28/2018 1107   CL 103 02/02/2017 1158   CO2 28 03/28/2018 1107   CO2 29 02/02/2017 1158   CO2 26 07/24/2016 1007   BUN 38 (H) 03/28/2018 1107   BUN 20 02/02/2017 1158   BUN 30.3 (H) 07/24/2016 1007   CREATININE 1.38 (H) 03/28/2018 1107   CREATININE 0.9 02/02/2017 1158   CREATININE 1.1 07/24/2016 1007   GLU 129 02/02/2017      Component Value Date/Time   CALCIUM 9.8 03/28/2018 1107   CALCIUM 9.0 02/02/2017 1158   CALCIUM 9.3 07/24/2016 1007   ALKPHOS 74 03/28/2018 1107   ALKPHOS 110 (H) 02/02/2017 1158   ALKPHOS 104 07/24/2016 1007   AST 11 (L) 03/28/2018 1107   AST 13 07/24/2016 1007   ALT 10 03/28/2018 1107   ALT 26 02/02/2017 1158   ALT 15 07/24/2016 1007   BILITOT 0.5 03/28/2018 1107   BILITOT 0.55 07/24/2016 1007      Impression and Plan: Ms. Kareem is 68 year old [name]. She has essential thrombocythemia. She actually is Calreticulin positive.  Again, I talked to her about doing a bone marrow biopsy.  She just is not willing to undergo this.  She says that since she feels good, she is not sure why she has to have an invasive study.  I told her that we have newer treatments that we might be able to use with her but that we need to have a bone marrow biopsy done so that we can know exactly what we are dealing with.  She is still going to hold off on doing a bone marrow biopsy.  We will see what the NGS studies show.  Maybe this can give [place] an idea as to why she has disease that is somewhat resilient to therapy.  I will plan to see her back in another 4 weeks.  She might be more agreeable to having a bone marrow test done at that  time.     Marland Kitchen [name], MD 1/27/202012:23 PM

## 2018-03-28 NOTE — Addendum Note (Signed)
Addended by: Burney Gauze R on: 03/28/2018 01:20 PM   Modules accepted: Orders

## 2018-03-29 LAB — FERRITIN: Ferritin: 274 ng/mL (ref 11–307)

## 2018-03-29 LAB — IRON AND TIBC
IRON: 73 ug/dL (ref 41–142)
Saturation Ratios: 34 % (ref 21–57)
TIBC: 215 ug/dL — ABNORMAL LOW (ref 236–444)
UIBC: 142 ug/dL (ref 120–384)

## 2018-04-04 ENCOUNTER — Other Ambulatory Visit: Payer: Self-pay | Admitting: Family

## 2018-04-18 ENCOUNTER — Other Ambulatory Visit: Payer: Self-pay | Admitting: Internal Medicine

## 2018-04-22 LAB — MPN-ET/MYELOFIBROSIS (JAK2 V617F-MPL515-CALR)

## 2018-04-27 ENCOUNTER — Inpatient Hospital Stay: Payer: Medicare Other

## 2018-04-27 ENCOUNTER — Inpatient Hospital Stay: Payer: Medicare Other | Attending: Hematology & Oncology | Admitting: Hematology & Oncology

## 2018-04-27 VITALS — BP 132/71 | HR 74 | Temp 97.8°F | Resp 18 | Wt 137.2 lb

## 2018-04-27 DIAGNOSIS — D473 Essential (hemorrhagic) thrombocythemia: Secondary | ICD-10-CM

## 2018-04-27 DIAGNOSIS — Z79899 Other long term (current) drug therapy: Secondary | ICD-10-CM | POA: Diagnosis not present

## 2018-04-27 DIAGNOSIS — Z7982 Long term (current) use of aspirin: Secondary | ICD-10-CM | POA: Diagnosis not present

## 2018-04-27 LAB — LACTATE DEHYDROGENASE: LDH: 294 U/L — ABNORMAL HIGH (ref 98–192)

## 2018-04-27 LAB — CMP (CANCER CENTER ONLY)
ALT: 20 U/L (ref 0–44)
AST: 19 U/L (ref 15–41)
Albumin: 4.3 g/dL (ref 3.5–5.0)
Alkaline Phosphatase: 82 U/L (ref 38–126)
Anion gap: 8 (ref 5–15)
BUN: 30 mg/dL — AB (ref 8–23)
CO2: 29 mmol/L (ref 22–32)
CREATININE: 1.18 mg/dL — AB (ref 0.44–1.00)
Calcium: 9.4 mg/dL (ref 8.9–10.3)
Chloride: 105 mmol/L (ref 98–111)
GFR, Est AFR Am: 55 mL/min — ABNORMAL LOW (ref >60)
GFR, Estimated: 48 mL/min — ABNORMAL LOW (ref >60)
Glucose, Bld: 126 mg/dL — ABNORMAL HIGH (ref 70–99)
Potassium: 3.5 mmol/L (ref 3.5–5.1)
Sodium: 142 mmol/L (ref 135–145)
Total Bilirubin: 0.6 mg/dL (ref 0.3–1.2)
Total Protein: 7.5 g/dL (ref 6.5–8.1)

## 2018-04-27 LAB — CBC WITH DIFFERENTIAL (CANCER CENTER ONLY)
ABS IMMATURE GRANULOCYTES: 0.04 10*3/uL (ref 0.00–0.07)
Basophils Absolute: 0.1 10*3/uL (ref 0.0–0.1)
Basophils Relative: 1 %
Eosinophils Absolute: 0.1 10*3/uL (ref 0.0–0.5)
Eosinophils Relative: 1 %
HCT: 24.6 % — ABNORMAL LOW (ref 36.0–46.0)
Hemoglobin: 7.4 g/dL — ABNORMAL LOW (ref 12.0–15.0)
IMMATURE GRANULOCYTES: 1 %
Lymphocytes Relative: 20 %
Lymphs Abs: 1.4 10*3/uL (ref 0.7–4.0)
MCH: 24.7 pg — ABNORMAL LOW (ref 26.0–34.0)
MCHC: 30.1 g/dL (ref 30.0–36.0)
MCV: 82.3 fL (ref 80.0–100.0)
Monocytes Absolute: 0.7 10*3/uL (ref 0.1–1.0)
Monocytes Relative: 10 %
NEUTROS ABS: 4.7 10*3/uL (ref 1.7–7.7)
NEUTROS PCT: 67 %
NRBC: 0 % (ref 0.0–0.2)
Platelet Count: 711 10*3/uL — ABNORMAL HIGH (ref 150–400)
RBC: 2.99 MIL/uL — ABNORMAL LOW (ref 3.87–5.11)
RDW: 20.9 % — ABNORMAL HIGH (ref 11.5–15.5)
WBC Count: 6.9 10*3/uL (ref 4.0–10.5)

## 2018-04-27 LAB — SAVE SMEAR(SSMR), FOR PROVIDER SLIDE REVIEW

## 2018-04-27 NOTE — Progress Notes (Signed)
Plan Alexandra [name]  It is [employer].  Hematology and Oncology Follow Up Visit  Alexandra [name] [id number] [birth year] 68 y.o. 04/27/2018   Principle Diagnosis:   Anagrelide 7 mg po q day - dose changed 03/28/2018 --off now due to backorder of anagrelide  Aspirin 81 mg by mouth daily  Folic acid 2 mg by mouth daily   Current Therapy:    Essential thrombocythemia-Calreticulin positive  Interim History:  [name] is back for followup.  Unfortunately, we have a backorder of anagrelide.  This is a real problem.  I am not sure as to when the backorder will and.  It is obvious that her platelet count is going up relatively quickly.  She has agreed to a bone marrow test now.  It is been quite a while since she has had one done.  We really need to get 1 so that we can see exactly what is going on.  I think the only option that we have right now is interferon.  I definitely would consider interferon for her.  I think that she could tolerate this and that I think that it would be effective.  I know that this is an injection.  I do hate the fact that it is an injection.  I will have to try to get this set up so that we can at least start in the office.  She is feeling well overall.  Despite being quite anemic, she is asymptomatic.  She has had no issues with nausea or vomiting.  She has had no cough.  There is been no rashes.  She has had no diarrhea.  She has had no leg swelling.  Overall, her performance status is ECOG 1.  Medications:  Current Outpatient Medications:  .  amLODipine (NORVASC) 10 MG tablet, TAKE 1 TABLET BY MOUTH ONCE DAILY, Disp: 90 tablet, Rfl: 0 .  anagrelide (AGRYLIN) 1 MG capsule, TAKE 4 CAPSULES BY MOUTH TWICE A DAY. (Patient taking differently: TAKE 6  CAPSULES BY MOUTH TWICE A DAY.), Disp: 150 capsule, Rfl: 6 .  aspirin 81 MG tablet, Take 81 mg by mouth daily., Disp: , Rfl:  .  BYSTOLIC 20 MG TABS, TAKE 1 TABLET BY MOUTH ONCE DAILY, Disp: 90 each, Rfl: 0 .  Fe  Fum-FePoly-Vit C-Vit B3 (INTEGRA) 62.5-62.5-40-3 MG CAPS, Take 1 tablet by mouth daily., Disp: 30 capsule, Rfl: 8 .  folic acid (FOLVITE) 1 MG tablet, TAKE 2 TABLETS BY MOUTH ONCE DAILY, Disp: 60 tablet, Rfl: 11 .  glucose blood (ONE TOUCH ULTRA TEST) test strip, Use to test blood sugar 1 times daily. **PT NEEDS FOLLOW UP APPT FOR FURTHER REFILLS**, Disp: 100 each, Rfl: 0 .  lisinopril (PRINIVIL,ZESTRIL) 20 MG tablet, TAKE 2 TABLETS BY MOUTH ONCE DAILY, Disp: 180 tablet, Rfl: 0 .  ONETOUCH DELICA LANCETS 79K MISC, Use to test blood sugar 1 time daily. **PT NEEDS FOLLOW UP APPT FOR FURTHER REFILLS**, Disp: 100 each, Rfl: 1 .  Vitamin D, Ergocalciferol, (DRISDOL) 1.25 MG (50000 UT) CAPS capsule, TAKE 1 CAPSULE BY MOUTH 1 TIME A WEEK, Disp: 12 capsule, Rfl: 0  Allergies: No Known Allergies  Past Medical History, Surgical history, Social history, and Family History were reviewed and updated.  Review of Systems: Review of Systems  All other systems reviewed and are negative. Marland Kitchen  Physical Exam:  vitals were not taken for this visit.   Physical Exam Vitals signs reviewed.  HENT:     Head: Normocephalic and atraumatic.  Eyes:     Pupils: Pupils are equal, round, and reactive to light.  Neck:     Musculoskeletal: Normal range of motion.  Cardiovascular:     Rate and Rhythm: Normal rate and regular rhythm.     Heart sounds: Normal heart sounds.  Pulmonary:     Effort: Pulmonary effort is normal.     Breath sounds: Normal breath sounds.  Abdominal:     General: Bowel sounds are normal.     Palpations: Abdomen is soft.  Musculoskeletal: Normal range of motion.        General: No tenderness or deformity.  Lymphadenopathy:     Cervical: No cervical adenopathy.  Skin:    General: Skin is warm and dry.     Findings: No erythema or rash.  Neurological:     Mental Status: She is alert and oriented to person, place, and time.  Psychiatric:        Behavior: Behavior normal.        Thought  Content: Thought content normal.        Judgment: Judgment normal.      Lab Results  Component Value Date   WBC 6.9 04/27/2018   HGB 7.4 (L) 04/27/2018   HCT 24.6 (L) 04/27/2018   MCV 82.3 04/27/2018   PLT 711 (H) 04/27/2018     Chemistry      Component Value Date/Time   NA 142 04/27/2018 1131   NA 144 02/02/2017 1158   NA 140 07/24/2016 1007   K 3.5 04/27/2018 1131   K 3.7 02/02/2017 1158   K 3.6 07/24/2016 1007   CL 105 04/27/2018 1131   CL 103 02/02/2017 1158   CO2 29 04/27/2018 1131   CO2 29 02/02/2017 1158   CO2 26 07/24/2016 1007   BUN 30 (H) 04/27/2018 1131   BUN 20 02/02/2017 1158   BUN 30.3 (H) 07/24/2016 1007   CREATININE 1.18 (H) 04/27/2018 1131   CREATININE 0.9 02/02/2017 1158   CREATININE 1.1 07/24/2016 1007   GLU 129 02/02/2017      Component Value Date/Time   CALCIUM 9.4 04/27/2018 1131   CALCIUM 9.0 02/02/2017 1158   CALCIUM 9.3 07/24/2016 1007   ALKPHOS 82 04/27/2018 1131   ALKPHOS 110 (H) 02/02/2017 1158   ALKPHOS 104 07/24/2016 1007   AST 19 04/27/2018 1131   AST 13 07/24/2016 1007   ALT 20 04/27/2018 1131   ALT 26 02/02/2017 1158   ALT 15 07/24/2016 1007   BILITOT 0.6 04/27/2018 1131   BILITOT 0.55 07/24/2016 1007      Impression and Plan: Alexandra [name] is 68 year old [name]. She has essential thrombocythemia. She actually is Calreticulin positive.  We will try to get the bone marrow set up for next week.  We will see about getting the PEG-interferon set up after the bone marrow test is done.  I might have to consider referring her to [facility] so that she can see the world renowned myeloproliferative expert, Dr. Luz Brazen.  I will have her come back when we do her interferon injection.   Marland Kitchen [name], MD 2/26/202012:47 PM

## 2018-04-28 LAB — FERRITIN: Ferritin: 261 ng/mL (ref 11–307)

## 2018-04-28 LAB — IRON AND TIBC
Iron: 70 ug/dL (ref 41–142)
SATURATION RATIOS: 33 % (ref 21–57)
TIBC: 214 ug/dL — ABNORMAL LOW (ref 236–444)
UIBC: 144 ug/dL (ref 120–384)

## 2018-05-02 ENCOUNTER — Telehealth: Payer: Self-pay | Admitting: Hematology & Oncology

## 2018-05-02 NOTE — Telephone Encounter (Signed)
Appointments scheduled letter/calendar mailed per 3/2 los

## 2018-05-12 ENCOUNTER — Other Ambulatory Visit: Payer: Self-pay | Admitting: Student

## 2018-05-13 ENCOUNTER — Ambulatory Visit (HOSPITAL_COMMUNITY)
Admission: RE | Admit: 2018-05-13 | Discharge: 2018-05-13 | Disposition: A | Discharge status: Home / Self Care | Payer: Medicare Other | Source: Ambulatory Visit | Attending: Hematology & Oncology | Admitting: Hematology & Oncology

## 2018-05-13 ENCOUNTER — Encounter (HOSPITAL_COMMUNITY): Payer: Self-pay

## 2018-05-13 ENCOUNTER — Other Ambulatory Visit: Payer: Self-pay

## 2018-05-13 DIAGNOSIS — Z833 Family history of diabetes mellitus: Secondary | ICD-10-CM | POA: Diagnosis not present

## 2018-05-13 DIAGNOSIS — I10 Essential (primary) hypertension: Secondary | ICD-10-CM | POA: Diagnosis not present

## 2018-05-13 DIAGNOSIS — D473 Essential (hemorrhagic) thrombocythemia: Secondary | ICD-10-CM | POA: Insufficient documentation

## 2018-05-13 DIAGNOSIS — D509 Iron deficiency anemia, unspecified: Secondary | ICD-10-CM | POA: Insufficient documentation

## 2018-05-13 DIAGNOSIS — E119 Type 2 diabetes mellitus without complications: Secondary | ICD-10-CM | POA: Diagnosis not present

## 2018-05-13 DIAGNOSIS — Z7982 Long term (current) use of aspirin: Secondary | ICD-10-CM | POA: Insufficient documentation

## 2018-05-13 DIAGNOSIS — C946 Myelodysplastic disease, not classified: Secondary | ICD-10-CM | POA: Diagnosis not present

## 2018-05-13 DIAGNOSIS — Z8249 Family history of ischemic heart disease and other diseases of the circulatory system: Secondary | ICD-10-CM | POA: Diagnosis not present

## 2018-05-13 DIAGNOSIS — Z79899 Other long term (current) drug therapy: Secondary | ICD-10-CM | POA: Insufficient documentation

## 2018-05-13 LAB — CBC WITH DIFFERENTIAL/PLATELET
Abs Immature Granulocytes: 0.04 10*3/uL (ref 0.00–0.07)
Basophils Absolute: 0.1 10*3/uL (ref 0.0–0.1)
Basophils Relative: 1 %
Eosinophils Absolute: 0.2 10*3/uL (ref 0.0–0.5)
Eosinophils Relative: 2 %
HCT: 28 % — ABNORMAL LOW (ref 36.0–46.0)
Hemoglobin: 8 g/dL — ABNORMAL LOW (ref 12.0–15.0)
Immature Granulocytes: 1 %
Lymphocytes Relative: 23 %
Lymphs Abs: 2 10*3/uL (ref 0.7–4.0)
MCH: 24.8 pg — ABNORMAL LOW (ref 26.0–34.0)
MCHC: 28.6 g/dL — ABNORMAL LOW (ref 30.0–36.0)
MCV: 86.7 fL (ref 80.0–100.0)
MONOS PCT: 9 %
Monocytes Absolute: 0.8 10*3/uL (ref 0.1–1.0)
NEUTROS ABS: 5.7 10*3/uL (ref 1.7–7.7)
Neutrophils Relative %: 64 %
Platelets: 1186 10*3/uL (ref 150–400)
RBC: 3.23 MIL/uL — ABNORMAL LOW (ref 3.87–5.11)
RDW: 20.6 % — ABNORMAL HIGH (ref 11.5–15.5)
WBC: 8.8 10*3/uL (ref 4.0–10.5)
nRBC: 0 % (ref 0.0–0.2)

## 2018-05-13 LAB — GLUCOSE, CAPILLARY: Glucose-Capillary: 113 mg/dL — ABNORMAL HIGH (ref 70–99)

## 2018-05-13 LAB — PROTIME-INR
INR: 1.1 (ref 0.8–1.2)
Prothrombin Time: 13.9 seconds (ref 11.4–15.2)

## 2018-05-13 LAB — APTT: aPTT: 34 seconds (ref 24–36)

## 2018-05-13 MED ORDER — LIDOCAINE HCL (PF) 1 % IJ SOLN
INTRAMUSCULAR | Status: AC | PRN
  Administered 2018-05-13: 10 mL

## 2018-05-13 MED ORDER — SODIUM CHLORIDE 0.9 % IV SOLN
INTRAVENOUS | Status: DC
  Administered 2018-05-13: 10:00:00 via INTRAVENOUS

## 2018-05-13 MED ORDER — FENTANYL CITRATE (PF) 100 MCG/2ML IJ SOLN
INTRAMUSCULAR | Status: AC | PRN
  Administered 2018-05-13 (×2): 50 ug via INTRAVENOUS

## 2018-05-13 MED ORDER — MIDAZOLAM HCL 2 MG/2ML IJ SOLN
INTRAMUSCULAR | Status: AC | PRN
  Administered 2018-05-13 (×2): 1 mg via INTRAVENOUS

## 2018-05-13 MED ORDER — FLUMAZENIL 0.5 MG/5ML IV SOLN
INTRAVENOUS | Status: AC
  Filled 2018-05-13: qty 5

## 2018-05-13 MED ORDER — NALOXONE HCL 0.4 MG/ML IJ SOLN
INTRAMUSCULAR | Status: AC
  Filled 2018-05-13: qty 1

## 2018-05-13 MED ORDER — FENTANYL CITRATE (PF) 100 MCG/2ML IJ SOLN
INTRAMUSCULAR | Status: AC
  Filled 2018-05-13: qty 2

## 2018-05-13 MED ORDER — MIDAZOLAM HCL 2 MG/2ML IJ SOLN
INTRAMUSCULAR | Status: AC
  Filled 2018-05-13: qty 4

## 2018-05-13 NOTE — Discharge Instructions (Signed)

## 2018-05-13 NOTE — Procedures (Signed)
Interventional Radiology Procedure:   Indications:  Essential thrombocythemia  Procedure: CT guided bone marrow biopsy  Findings: 2 aspirates and 1 core  Complications: None     EBL: Minimal  Plan: Discharge to home in one hour.     Yue Glasheen R. Anselm Pancoast, MD  Pager: [phone]

## 2018-05-13 NOTE — Consult Note (Signed)
Chief Complaint: Patient was seen in consultation today for CT-guided bone marrow biopsy  Referring Physician(s): Ennever,Peter R  Supervising Physician: Markus Daft  Patient Status: [facility] - Out-pt  History of Present Illness: Alexandra [name] is a 68 y.o. female with history of essential thrombocythemia and positive calreticulin who presents today for CT-guided bone marrow biopsy for further evaluation.  Past Medical History:  Diagnosis Date  . Diabetes mellitus without complication ([place])   . Hypertension    echo   . Iron deficiency anemia, unspecified 03/15/2013  . Thrombocytosis ([place])     Past Surgical History:  Procedure Laterality Date  . BONE MARROW BIOPSY    . ECTOPIC PREGNANCY SURGERY  1984    Allergies: Patient has no known allergies.  Medications: Prior to Admission medications   Medication Sig Start Date End Date Taking? Authorizing Provider  amLODipine (NORVASC) 10 MG tablet TAKE 1 TABLET BY MOUTH ONCE DAILY 02/04/18  Yes Panosh, Standley Brooking, MD  aspirin 81 MG tablet Take 81 mg by mouth daily.   Yes [provider]  BYSTOLIC 20 MG TABS TAKE 1 TABLET BY MOUTH ONCE DAILY 04/18/18  Yes Panosh, Standley Brooking, MD  Fe Fum-FePoly-Vit C-Vit B3 (INTEGRA) 62.5-62.5-40-3 MG CAPS Take 1 tablet by mouth daily. 07/01/17  Yes [name], MD  folic acid (FOLVITE) 1 MG tablet TAKE 2 TABLETS BY MOUTH ONCE DAILY 03/29/18  Yes [place], Holli Humbles, NP  lisinopril (PRINIVIL,ZESTRIL) 20 MG tablet TAKE 2 TABLETS BY MOUTH ONCE DAILY 02/04/18  Yes Panosh, Standley Brooking, MD  anagrelide (AGRYLIN) 1 MG capsule TAKE 4 CAPSULES BY MOUTH TWICE A DAY. Patient taking differently: TAKE 7 CAPSULES BY MOUTH once a day . 12/13/17   [name], MD  glucose blood (ONE TOUCH ULTRA TEST) test strip Use to test blood sugar 1 times daily. **PT NEEDS FOLLOW UP APPT FOR FURTHER REFILLS** 04/01/15   Philemon Kingdom, MD  [facility] DELICA LANCETS 32I MISC Use to test blood sugar 1 time daily. **PT NEEDS  FOLLOW UP APPT FOR FURTHER REFILLS** 04/01/15   Philemon Kingdom, MD  Vitamin D, Ergocalciferol, (DRISDOL) 1.25 MG (50000 UT) CAPS capsule TAKE 1 CAPSULE BY MOUTH 1 TIME A WEEK 04/04/18   [place], Holli Humbles, NP     Family History  Problem Relation Age of Onset  . Heart disease Mother        died age 71  . Kidney disease Mother        family hx  . Heart attack Father 69       massive  . Hypertension Sister   . Hypertension Sister   . Hypertension Sister   . Hypertension Sister   . Arthritis Unknown        family hx  . Diabetes Unknown        family hx  . Stroke Unknown         dialysis    Social History   Socioeconomic History  . Marital status: Married    Spouse name: Not on file  . Number of children: Not on file  . Years of education: Not on file  . Highest education level: Not on file  Occupational History  . Not on file  Social Needs  . Financial resource strain: Not on file  . Food insecurity:    Worry: Not on file    Inability: Not on file  . Transportation needs:    Medical: Not on file    Non-medical: Not on file  Tobacco Use  . Smoking status: Never Smoker  . Smokeless tobacco: Never Used  . Tobacco comment: never used tobacco  Substance and Sexual Activity  . Alcohol use: No    Alcohol/week: 0.0 standard drinks  . Drug use: No  . Sexual activity: Not on file  Lifestyle  . Physical activity:    Days per week: Not on file    Minutes per session: Not on file  . Stress: Not on file  Relationships  . Social connections:    Talks on phone: Not on file    Gets together: Not on file    Attends religious service: Not on file    Active member of club or organization: Not on file    Attends meetings of clubs or organizations: Not on file    Relationship status: Not on file  Other Topics Concern  . 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   No tobacco no ethoh little caffiene  .      Review of Systems denies fever, headache, chest pain, dyspnea, cough, abdominal/back pain, nausea, vomiting or bleeding  Vital Signs: Blood pressure 140/81, heart rate 79, respirations 18, O2 sat 100% room air, temperature 98.4  Physical Exam weight, alert.  Chest clear to auscultation bilaterally.  Heart with regular rate and rhythm.  Abdomen soft, positive bowel sounds, nontender.  No lower extremity edema.  Imaging: No results found.  Labs:  CBC: Recent Labs    02/07/18 1000 03/28/18 1107 04/27/18 1131 05/13/18 1020  WBC 6.7 8.7 6.9 8.8  HGB 7.9* 7.9* 7.4* 8.0*  HCT 27.0* 26.0* 24.6* 28.0*  PLT 283 517* 711* 1,186*    COAGS: Recent Labs    05/13/18 1020  INR 1.1  APTT 34    BMP: Recent Labs    12/13/17 1001 02/07/18 1000 03/28/18 1107 04/27/18 1131  NA 141 140 137 142  K 3.2* 3.0* 3.9 3.5  CL 105 102 102 105  CO2 '26 28 28 29  ' GLUCOSE 108* 120* 220* 126*  BUN 28* 26* 38* 30*  CALCIUM 9.5 9.3 9.8 9.4  CREATININE 1.02* 1.37* 1.38* 1.18*  GFRNONAA 56* 40* 39* 48*  GFRAA >60 46* 46* 55*    LIVER FUNCTION TESTS: Recent Labs    12/13/17 1001 02/07/18 1000 03/28/18 1107 04/27/18 1131  BILITOT 0.5 0.5 0.5 0.6  AST 22 13* 11* 19  ALT '16 13 10 20  ' ALKPHOS 95 88 74 82  PROT 7.7 7.2 7.3 7.5  ALBUMIN 3.7 4.1 4.2 4.3    TUMOR MARKERS: No results for input(s): AFPTM, CEA, CA199, CHROMGRNA in the last 8760 hours.  Assessment and Plan: 68 y.o. female with history of essential thrombocythemia(now worsening) and positive calreticulin who presents today for CT-guided bone marrow biopsy for further evaluation.Risks and benefits of procedure was discussed with the patient and/or patient's family including, but not limited to bleeding, infection, damage to adjacent structures or low yield requiring additional tests.  All of the questions were answered and there is agreement to proceed.  Consent signed and in chart.     Thank you for this  interesting consult.  I greatly enjoyed meeting Alexandra [name] and look forward to participating in their care.  A copy of this report was sent to the requesting provider on this date.  Electronically Signed: D. Rowe Robert, PA-C  05/13/2018, 11:01 AM   I spent a total of 20 minutes  in face to face in clinical consultation, greater than 50% of which was counseling/coordinating care for CT-guided bone marrow biopsy

## 2018-05-13 NOTE — Progress Notes (Signed)
1300 - After ambulating to the restroom prior to discharge, pt's biopsy site began bleeding and saturated the gauze dressing.  Nurse caring for pt paged Rowe Robert, PA-C who came to bedside and redressed the biopsy site.  Instructions received to wait 30 min and recheck/redress site.  Pt updated by nurse at that time.    1345 - this RN returned and assumed care of pt at this time.  Site reassessed.  Still oozing blood when dressing removed.  Held direct pressure for 5 minutes, instructed pt to lie directly on the site for 5 more mintues.  Site checked again at that time and bleeding had stopped completely.  New dressing placed, pt assisted to dress for discharge.    1355 - Pt discharged to home in stable condition accompanied by husband.  No signs of bleeding from site at this time.  Pt educated regarding risk of bleeding, signs and symptoms, and interventions.  Pt stated understanding and feels comfortable with being discharged at this time.  Coolidge Breeze, RN 05/13/2018

## 2018-05-16 ENCOUNTER — Inpatient Hospital Stay: Payer: Medicare Other | Attending: Hematology & Oncology

## 2018-05-16 ENCOUNTER — Inpatient Hospital Stay: Payer: Medicare Other

## 2018-05-16 ENCOUNTER — Other Ambulatory Visit: Payer: Self-pay

## 2018-05-16 ENCOUNTER — Telehealth: Payer: Self-pay | Admitting: *Deleted

## 2018-05-16 ENCOUNTER — Encounter: Payer: Self-pay | Admitting: Hematology & Oncology

## 2018-05-16 ENCOUNTER — Inpatient Hospital Stay (HOSPITAL_BASED_OUTPATIENT_CLINIC_OR_DEPARTMENT_OTHER): Payer: Medicare Other | Admitting: Hematology & Oncology

## 2018-05-16 VITALS — BP 129/77 | HR 89 | Temp 98.0°F | Resp 16 | Wt 134.0 lb

## 2018-05-16 DIAGNOSIS — D473 Essential (hemorrhagic) thrombocythemia: Secondary | ICD-10-CM

## 2018-05-16 DIAGNOSIS — Z79899 Other long term (current) drug therapy: Secondary | ICD-10-CM | POA: Insufficient documentation

## 2018-05-16 LAB — CBC WITH DIFFERENTIAL (CANCER CENTER ONLY)
Abs Immature Granulocytes: 0.03 10*3/uL (ref 0.00–0.07)
Basophils Absolute: 0.1 10*3/uL (ref 0.0–0.1)
Basophils Relative: 1 %
Eosinophils Absolute: 0.2 10*3/uL (ref 0.0–0.5)
Eosinophils Relative: 2 %
HCT: 29.6 % — ABNORMAL LOW (ref 36.0–46.0)
HEMOGLOBIN: 8.8 g/dL — AB (ref 12.0–15.0)
Immature Granulocytes: 0 %
Lymphocytes Relative: 22 %
Lymphs Abs: 2 10*3/uL (ref 0.7–4.0)
MCH: 25.1 pg — ABNORMAL LOW (ref 26.0–34.0)
MCHC: 29.7 g/dL — ABNORMAL LOW (ref 30.0–36.0)
MCV: 84.3 fL (ref 80.0–100.0)
Monocytes Absolute: 0.7 10*3/uL (ref 0.1–1.0)
Monocytes Relative: 8 %
Neutro Abs: 6 10*3/uL (ref 1.7–7.7)
Neutrophils Relative %: 67 %
Platelet Count: 1355 10*3/uL — ABNORMAL HIGH (ref 150–400)
RBC: 3.51 MIL/uL — AB (ref 3.87–5.11)
RDW: 20.8 % — ABNORMAL HIGH (ref 11.5–15.5)
WBC Count: 9 10*3/uL (ref 4.0–10.5)
nRBC: 0 % (ref 0.0–0.2)

## 2018-05-16 LAB — SAMPLE TO BLOOD BANK

## 2018-05-16 LAB — LACTATE DEHYDROGENASE: LDH: 359 U/L — ABNORMAL HIGH (ref 98–192)

## 2018-05-16 LAB — SAVE SMEAR(SSMR), FOR PROVIDER SLIDE REVIEW

## 2018-05-16 MED ORDER — HYDROXYUREA 500 MG PO CAPS: 1000.0000 mg | ORAL_CAPSULE | Freq: Two times a day (BID) | ORAL | 2 refills | Status: DC

## 2018-05-16 MED FILL — HYDROXYUREA 500 MG CAPSULE: 500 | 30 days supply | Qty: 120 | Fill #0 | Status: TO

## 2018-05-16 NOTE — Progress Notes (Signed)
[name]  It is a [employer].  Hematology and Oncology Follow Up Visit  PAW KARSTENS [id number] [birth year] 68 y.o. 05/16/2018   Principle Diagnosis:   Anagrelide 7 mg po q day - dose changed 03/28/2018 --off now due to backorder of anagrelide  Hydrea 1000 mg po BID - start on 05/16/2018  Aspirin 81 mg by mouth daily  Folic acid 2 mg by mouth daily   Current Therapy:    Essential thrombocythemia-Calreticulin positive  Interim History:  Ms.  Parkison is back for followup.  She had a bone marrow biopsy done on Friday, March 13.  Unfortunately, I do not have the report back as of yet.  Her platelet count is 1.35 million.  We really need to get her going on treatment.  However, she is refused PEG-interferon.  She just thinks that this will make her feel too bad.  The only other option that we have right now is Hydrea.  We will get her back on Hydrea.  She will take 1000 mg p.o. twice daily.  This is a very tough situation.  She obviously has essential thrombocythemia.  The bone marrow will certainly help [place] out.  Her appetite is still doing good.  She has had no nausea or vomiting.  She has had no change in bowel or bladder habits.  There is been no cough.  She is had no bony pain.  She has had no rashes.  There has been no fever.  Overall, she is still relatively a symptomatic with this.  Her performance status is ECOG 1.    Medications:  Current Outpatient Medications:  .  amLODipine (NORVASC) 10 MG tablet, TAKE 1 TABLET BY MOUTH ONCE DAILY, Disp: 90 tablet, Rfl: 0 .  anagrelide (AGRYLIN) 1 MG capsule, TAKE 4 CAPSULES BY MOUTH TWICE A DAY. (Patient taking differently: TAKE 7 CAPSULES BY MOUTH once a day .), Disp: 150 capsule, Rfl: 6 .  aspirin 81 MG tablet, Take 81 mg by mouth daily., Disp: , Rfl:  .  BYSTOLIC 20 MG TABS, TAKE 1 TABLET BY MOUTH ONCE DAILY, Disp: 90 each, Rfl: 0 .  Fe Fum-FePoly-Vit C-Vit B3 (INTEGRA) 62.5-62.5-40-3 MG CAPS, Take 1 tablet by mouth daily.,  Disp: 30 capsule, Rfl: 8 .  folic acid (FOLVITE) 1 MG tablet, TAKE 2 TABLETS BY MOUTH ONCE DAILY, Disp: 60 tablet, Rfl: 11 .  glucose blood (ONE TOUCH ULTRA TEST) test strip, Use to test blood sugar 1 times daily. **PT NEEDS FOLLOW UP APPT FOR FURTHER REFILLS**, Disp: 100 each, Rfl: 0 .  hydroxyurea (HYDREA) 500 MG capsule, Take 2 capsules (1,000 mg total) by mouth 2 (two) times daily. May take with food to minimize GI side effects., Disp: 120 capsule, Rfl: 2 .  lisinopril (PRINIVIL,ZESTRIL) 20 MG tablet, TAKE 2 TABLETS BY MOUTH ONCE DAILY, Disp: 180 tablet, Rfl: 0 .  ONETOUCH DELICA LANCETS 18A MISC, Use to test blood sugar 1 time daily. **PT NEEDS FOLLOW UP APPT FOR FURTHER REFILLS**, Disp: 100 each, Rfl: 1 .  Vitamin D, Ergocalciferol, (DRISDOL) 1.25 MG (50000 UT) CAPS capsule, TAKE 1 CAPSULE BY MOUTH 1 TIME A WEEK, Disp: 12 capsule, Rfl: 0  Allergies: No Known Allergies  Past Medical History, Surgical history, Social history, and Family History were reviewed and updated.  Review of Systems: Review of Systems  All other systems reviewed and are negative. Marland Kitchen  Physical Exam:  weight is 134 lb (60.8 kg). Her oral temperature is 98 F (36.7 C). Her blood  pressure is 129/77 and her pulse is 89. Her respiration is 16 and oxygen saturation is 100%.   Physical Exam Vitals signs reviewed.  HENT:     Head: Normocephalic and atraumatic.  Eyes:     Pupils: Pupils are equal, round, and reactive to light.  Neck:     Musculoskeletal: Normal range of motion.  Cardiovascular:     Rate and Rhythm: Normal rate and regular rhythm.     Heart sounds: Normal heart sounds.  Pulmonary:     Effort: Pulmonary effort is normal.     Breath sounds: Normal breath sounds.  Abdominal:     General: Bowel sounds are normal.     Palpations: Abdomen is soft.  Musculoskeletal: Normal range of motion.        General: No tenderness or deformity.  Lymphadenopathy:     Cervical: No cervical adenopathy.  Skin:     General: Skin is warm and dry.     Findings: No erythema or rash.  Neurological:     Mental Status: She is alert and oriented to person, place, and time.  Psychiatric:        Behavior: Behavior normal.        Thought Content: Thought content normal.        Judgment: Judgment normal.      Lab Results  Component Value Date   WBC 9.0 05/16/2018   HGB 8.8 (L) 05/16/2018   HCT 29.6 (L) 05/16/2018   MCV 84.3 05/16/2018   PLT 1,355 (H) 05/16/2018     Chemistry      Component Value Date/Time   NA 142 04/27/2018 1131   NA 144 02/02/2017 1158   NA 140 07/24/2016 1007   K 3.5 04/27/2018 1131   K 3.7 02/02/2017 1158   K 3.6 07/24/2016 1007   CL 105 04/27/2018 1131   CL 103 02/02/2017 1158   CO2 29 04/27/2018 1131   CO2 29 02/02/2017 1158   CO2 26 07/24/2016 1007   BUN 30 (H) 04/27/2018 1131   BUN 20 02/02/2017 1158   BUN 30.3 (H) 07/24/2016 1007   CREATININE 1.18 (H) 04/27/2018 1131   CREATININE 0.9 02/02/2017 1158   CREATININE 1.1 07/24/2016 1007   GLU 129 02/02/2017      Component Value Date/Time   CALCIUM 9.4 04/27/2018 1131   CALCIUM 9.0 02/02/2017 1158   CALCIUM 9.3 07/24/2016 1007   ALKPHOS 82 04/27/2018 1131   ALKPHOS 110 (H) 02/02/2017 1158   ALKPHOS 104 07/24/2016 1007   AST 19 04/27/2018 1131   AST 13 07/24/2016 1007   ALT 20 04/27/2018 1131   ALT 26 02/02/2017 1158   ALT 15 07/24/2016 1007   BILITOT 0.6 04/27/2018 1131   BILITOT 0.55 07/24/2016 1007      Impression and Plan: Ms. Millar is 68 year old [name]. She has essential thrombocythemia. She actually is Calreticulin positive.  Hopefully, the hydroxyurea will help [place].  Again, we will start off on without milligrams p.o. twice daily.  She will need to have weekly lab work.  I think this is necessary right now.  For right now, I will have her come back in 4 weeks.  I spent about 35 minutes with her today.  This is quite complicated.  She has a bone marrow malignancy that we are just  having a very difficult time controlling because anagrelide is not available right now.  All time spent face-to-face.  I had to coordinate her future appointments.  I was  counseled her about the hydroxyurea effects.  She understands.     [name], MD 3/16/202012:22 PM

## 2018-05-16 NOTE — Telephone Encounter (Signed)
Received call from [facility] in the lab giving a critical platelet count of 1,355. Labs given to MD.

## 2018-05-17 ENCOUNTER — Other Ambulatory Visit: Payer: Self-pay | Admitting: Internal Medicine

## 2018-05-17 LAB — IRON AND TIBC
IRON: 61 ug/dL (ref 41–142)
Saturation Ratios: 26 % (ref 21–57)
TIBC: 234 ug/dL — ABNORMAL LOW (ref 236–444)
UIBC: 172 ug/dL (ref 120–384)

## 2018-05-17 LAB — FERRITIN: Ferritin: 223 ng/mL (ref 11–307)

## 2018-05-18 ENCOUNTER — Telehealth: Payer: Self-pay | Admitting: *Deleted

## 2018-05-18 NOTE — Telephone Encounter (Signed)
-----   Message from [name], MD sent at 05/17/2018  3:03 PM EDT ----- Please call and tell her that the bone marrow does not show any leukemia.  The bone marrow actually is similar to what the bone marrow showed 5 years ago.  This is great news.

## 2018-05-18 NOTE — Telephone Encounter (Signed)
Called pt, notified of BM results. Pt verbalized understanding. No further concerns at this time

## 2018-05-18 NOTE — Telephone Encounter (Signed)
Unable to reach pt at home#. Called cell, lmovm for pt to call office regarding recent bmbx results.

## 2018-05-24 ENCOUNTER — Inpatient Hospital Stay: Payer: Medicare Other

## 2018-05-24 ENCOUNTER — Other Ambulatory Visit: Payer: Self-pay

## 2018-05-24 DIAGNOSIS — D473 Essential (hemorrhagic) thrombocythemia: Secondary | ICD-10-CM

## 2018-05-24 LAB — CBC WITH DIFFERENTIAL (CANCER CENTER ONLY)
Abs Immature Granulocytes: 0.01 10*3/uL (ref 0.00–0.07)
Basophils Absolute: 0.1 10*3/uL (ref 0.0–0.1)
Basophils Relative: 1 %
EOS PCT: 1 %
Eosinophils Absolute: 0 10*3/uL (ref 0.0–0.5)
HCT: 27.1 % — ABNORMAL LOW (ref 36.0–46.0)
Hemoglobin: 8.2 g/dL — ABNORMAL LOW (ref 12.0–15.0)
Immature Granulocytes: 0 %
Lymphocytes Relative: 32 %
Lymphs Abs: 1.2 10*3/uL (ref 0.7–4.0)
MCH: 25.5 pg — ABNORMAL LOW (ref 26.0–34.0)
MCHC: 30.3 g/dL (ref 30.0–36.0)
MCV: 84.2 fL (ref 80.0–100.0)
Monocytes Absolute: 0.2 10*3/uL (ref 0.1–1.0)
Monocytes Relative: 5 %
Neutro Abs: 2.2 10*3/uL (ref 1.7–7.7)
Neutrophils Relative %: 61 %
Platelet Count: 876 10*3/uL — ABNORMAL HIGH (ref 150–400)
RBC: 3.22 MIL/uL — ABNORMAL LOW (ref 3.87–5.11)
RDW: 19.9 % — ABNORMAL HIGH (ref 11.5–15.5)
WBC Count: 3.7 10*3/uL — ABNORMAL LOW (ref 4.0–10.5)
nRBC: 0 % (ref 0.0–0.2)

## 2018-05-25 ENCOUNTER — Encounter (HOSPITAL_COMMUNITY): Payer: Self-pay | Admitting: Hematology & Oncology

## 2018-05-31 ENCOUNTER — Inpatient Hospital Stay: Payer: Medicare Other

## 2018-05-31 ENCOUNTER — Encounter: Payer: Self-pay | Admitting: *Deleted

## 2018-05-31 ENCOUNTER — Other Ambulatory Visit: Payer: Self-pay | Admitting: *Deleted

## 2018-05-31 ENCOUNTER — Other Ambulatory Visit: Payer: Self-pay

## 2018-05-31 DIAGNOSIS — D473 Essential (hemorrhagic) thrombocythemia: Secondary | ICD-10-CM

## 2018-05-31 LAB — CBC WITH DIFFERENTIAL (CANCER CENTER ONLY)
Abs Immature Granulocytes: 0.02 10*3/uL (ref 0.00–0.07)
BASOS PCT: 1 %
Basophils Absolute: 0.1 10*3/uL (ref 0.0–0.1)
EOS ABS: 0.1 10*3/uL (ref 0.0–0.5)
Eosinophils Relative: 1 %
HCT: 28.2 % — ABNORMAL LOW (ref 36.0–46.0)
Hemoglobin: 8.8 g/dL — ABNORMAL LOW (ref 12.0–15.0)
Immature Granulocytes: 0 %
Lymphocytes Relative: 22 %
Lymphs Abs: 1.5 10*3/uL (ref 0.7–4.0)
MCH: 26.3 pg (ref 26.0–34.0)
MCHC: 31.2 g/dL (ref 30.0–36.0)
MCV: 84.2 fL (ref 80.0–100.0)
Monocytes Absolute: 0.8 10*3/uL (ref 0.1–1.0)
Monocytes Relative: 11 %
Neutro Abs: 4.4 10*3/uL (ref 1.7–7.7)
Neutrophils Relative %: 65 %
PLATELETS: 201 10*3/uL (ref 150–400)
RBC: 3.35 MIL/uL — ABNORMAL LOW (ref 3.87–5.11)
RDW: 20.5 % — ABNORMAL HIGH (ref 11.5–15.5)
WBC Count: 6.8 10*3/uL (ref 4.0–10.5)
nRBC: 0 % (ref 0.0–0.2)

## 2018-05-31 NOTE — Progress Notes (Signed)
Dr. Marin Olp notified of patient's CBC results from today.  Order received for pt to decrease dose of Agrylin to 5 capsules daily and for CBC to be repeated on Friday.  Pt in lobby and instructed of MD orders.  Pt verbalizes an understanding to decrease dose of Agrylin to 5 capsules a day and appt made for labs on 06/03/18.

## 2018-06-03 ENCOUNTER — Other Ambulatory Visit: Payer: Self-pay

## 2018-06-03 ENCOUNTER — Inpatient Hospital Stay: Payer: Medicare Other | Attending: Hematology & Oncology

## 2018-06-03 DIAGNOSIS — D473 Essential (hemorrhagic) thrombocythemia: Secondary | ICD-10-CM

## 2018-06-03 DIAGNOSIS — Z7982 Long term (current) use of aspirin: Secondary | ICD-10-CM | POA: Insufficient documentation

## 2018-06-03 DIAGNOSIS — Z79899 Other long term (current) drug therapy: Secondary | ICD-10-CM | POA: Diagnosis not present

## 2018-06-03 LAB — CBC WITH DIFFERENTIAL (CANCER CENTER ONLY)
Abs Immature Granulocytes: 0.02 10*3/uL (ref 0.00–0.07)
Basophils Absolute: 0.1 10*3/uL (ref 0.0–0.1)
Basophils Relative: 1 %
Eosinophils Absolute: 0.1 10*3/uL (ref 0.0–0.5)
Eosinophils Relative: 1 %
HCT: 27.5 % — ABNORMAL LOW (ref 36.0–46.0)
Hemoglobin: 8.5 g/dL — ABNORMAL LOW (ref 12.0–15.0)
Immature Granulocytes: 0 %
Lymphocytes Relative: 23 %
Lymphs Abs: 2 10*3/uL (ref 0.7–4.0)
MCH: 26 pg (ref 26.0–34.0)
MCHC: 30.9 g/dL (ref 30.0–36.0)
MCV: 84.1 fL (ref 80.0–100.0)
Monocytes Absolute: 1.1 10*3/uL — ABNORMAL HIGH (ref 0.1–1.0)
Monocytes Relative: 12 %
Neutro Abs: 5.5 10*3/uL (ref 1.7–7.7)
Neutrophils Relative %: 63 %
Platelet Count: 240 10*3/uL (ref 150–400)
RBC: 3.27 MIL/uL — ABNORMAL LOW (ref 3.87–5.11)
RDW: 21.4 % — ABNORMAL HIGH (ref 11.5–15.5)
WBC Count: 8.6 10*3/uL (ref 4.0–10.5)
nRBC: 0 % (ref 0.0–0.2)

## 2018-06-07 ENCOUNTER — Other Ambulatory Visit: Payer: Medicare Other

## 2018-06-13 ENCOUNTER — Telehealth: Payer: Self-pay

## 2018-06-13 NOTE — Telephone Encounter (Signed)
Unable to leave voicemail tried to call to schedule appt for pt to check up on diabetes

## 2018-06-16 ENCOUNTER — Inpatient Hospital Stay (HOSPITAL_BASED_OUTPATIENT_CLINIC_OR_DEPARTMENT_OTHER): Payer: Medicare Other | Admitting: Hematology & Oncology

## 2018-06-16 ENCOUNTER — Encounter: Payer: Self-pay | Admitting: Hematology & Oncology

## 2018-06-16 ENCOUNTER — Inpatient Hospital Stay: Payer: Medicare Other

## 2018-06-16 ENCOUNTER — Other Ambulatory Visit: Payer: Self-pay

## 2018-06-16 VITALS — BP 145/77 | HR 69 | Temp 97.5°F | Resp 16 | Wt 134.0 lb

## 2018-06-16 DIAGNOSIS — D473 Essential (hemorrhagic) thrombocythemia: Secondary | ICD-10-CM | POA: Diagnosis not present

## 2018-06-16 DIAGNOSIS — Z7982 Long term (current) use of aspirin: Secondary | ICD-10-CM

## 2018-06-16 DIAGNOSIS — Z79899 Other long term (current) drug therapy: Secondary | ICD-10-CM | POA: Diagnosis not present

## 2018-06-16 LAB — CBC WITH DIFFERENTIAL (CANCER CENTER ONLY)
Band Neutrophils: 0 %
Basophils Absolute: 0.1 10*3/uL (ref 0.0–0.1)
Basophils Relative: 1 %
Eosinophils Absolute: 0.2 10*3/uL (ref 0.0–0.5)
Eosinophils Relative: 2 %
HCT: 30 % — ABNORMAL LOW (ref 36.0–46.0)
Hemoglobin: 9 g/dL — ABNORMAL LOW (ref 12.0–15.0)
Lymphocytes Relative: 22 %
Lymphs Abs: 1.7 10*3/uL (ref 0.7–4.0)
MCH: 25.5 pg — ABNORMAL LOW (ref 26.0–34.0)
MCHC: 30 g/dL (ref 30.0–36.0)
MCV: 85 fL (ref 80.0–100.0)
Monocytes Absolute: 0.8 10*3/uL (ref 0.1–1.0)
Monocytes Relative: 10 %
Neutro Abs: 5.1 10*3/uL (ref 1.7–7.7)
Neutrophils Relative %: 65 %
Platelet Count: 349 10*3/uL (ref 150–400)
RBC: 3.53 MIL/uL — ABNORMAL LOW (ref 3.87–5.11)
RDW: 21.6 % — ABNORMAL HIGH (ref 11.5–15.5)
WBC Count: 7.9 10*3/uL (ref 4.0–10.5)
nRBC: 0 % (ref 0.0–0.2)

## 2018-06-16 NOTE — Progress Notes (Signed)
[name]  [employer] is [employer].  Hematology and Oncology Follow Up Visit  DILPREET FAIRES [id number] August 04, 1950 68 y.o. 06/16/2018   Principle Diagnosis:   Anagrelide 7 mg po q day - dose changed 03/28/2018 --off now due to backorder of anagrelide  Hydrea 1000 mg po BID - start on 05/16/2018  Aspirin 81 mg by mouth daily  Folic acid 2 mg by mouth daily   Current Therapy:    Essential thrombocythemia-Calreticulin positive  Interim History:  Ms.  Hoelzel is back for followup.  She did have the bone marrow biopsy done on 05/13/2018.  The pathology report ([id number]) showed a hypercellular marrow consistent with her essential thrombocythemia.  She did not have any increase in fibrosis.  There is no blasts that were noted.  Cytogenetics were done.  Her cytogenetics were normal.  We have done peripheral blood NGS panels and these have not shows any actionable mutations.  We still do not have the anagrelide.  It will not be available until July.  She is on Hydrea.  She is on 1000 mg p.o. twice daily.  Her platelet count 349,000 right now.  She has had no nausea or vomiting.  Her appetite is doing okay.  She is surviving the coronavirus by walking a lot in her neighborhood.  She had a very nice Easter weekend.  She had her church service on line.  She really misses going down to [place] where her family is.  She does have a sister up in [place] that she does worry about.  She has had no bleeding.  There is been no rashes.  She has had no leg swelling.  Overall, her performance status is ECOG 1.  Medications:  Current Outpatient Medications:  .  amLODipine (NORVASC) 10 MG tablet, Take 1 tablet by mouth once daily, Disp: 90 tablet, Rfl: 0 .  anagrelide (AGRYLIN) 1 MG capsule, TAKE 4 CAPSULES BY MOUTH TWICE A DAY. (Patient taking differently: TAKE 7 CAPSULES BY MOUTH once a day .), Disp: 150 capsule, Rfl: 6 .  aspirin 81 MG tablet, Take 81 mg by mouth daily., Disp: , Rfl:   .  BYSTOLIC 20 MG TABS, TAKE 1 TABLET BY MOUTH ONCE DAILY, Disp: 90 each, Rfl: 0 .  Fe Fum-FePoly-Vit C-Vit B3 (INTEGRA) 62.5-62.5-40-3 MG CAPS, Take 1 tablet by mouth daily., Disp: 30 capsule, Rfl: 8 .  folic acid (FOLVITE) 1 MG tablet, TAKE 2 TABLETS BY MOUTH ONCE DAILY, Disp: 60 tablet, Rfl: 11 .  glucose blood (ONE TOUCH ULTRA TEST) test strip, Use to test blood sugar 1 times daily. **PT NEEDS FOLLOW UP APPT FOR FURTHER REFILLS**, Disp: 100 each, Rfl: 0 .  hydroxyurea (HYDREA) 500 MG capsule, Take 2 capsules (1,000 mg total) by mouth 2 (two) times daily. May take with food to minimize GI side effects., Disp: 120 capsule, Rfl: 2 .  lisinopril (PRINIVIL,ZESTRIL) 20 MG tablet, Take 2 tablets by mouth once daily, Disp: 180 tablet, Rfl: 0 .  ONETOUCH DELICA LANCETS 72C MISC, Use to test blood sugar 1 time daily. **PT NEEDS FOLLOW UP APPT FOR FURTHER REFILLS**, Disp: 100 each, Rfl: 1 .  Vitamin D, Ergocalciferol, (DRISDOL) 1.25 MG (50000 UT) CAPS capsule, TAKE 1 CAPSULE BY MOUTH 1 TIME A WEEK, Disp: 12 capsule, Rfl: 0  Allergies: No Known Allergies  Past Medical History, Surgical history, Social history, and Family History were reviewed and updated.  Review of Systems: Review of Systems  All other systems reviewed and are  negative. .  Physical Exam:  weight is 134 lb (60.8 kg). Her oral temperature is 97.5 F (36.4 C) (abnormal). Her blood pressure is 145/77 (abnormal) and her pulse is 69. Her respiration is 16 and oxygen saturation is 100%.   Physical Exam Vitals signs reviewed.  HENT:     Head: Normocephalic and atraumatic.  Eyes:     Pupils: Pupils are equal, round, and reactive to light.  Neck:     Musculoskeletal: Normal range of motion.  Cardiovascular:     Rate and Rhythm: Normal rate and regular rhythm.     Heart sounds: Normal heart sounds.  Pulmonary:     Effort: Pulmonary effort is normal.     Breath sounds: Normal breath sounds.  Abdominal:     General: Bowel sounds  are normal.     Palpations: Abdomen is soft.  Musculoskeletal: Normal range of motion.        General: No tenderness or deformity.  Lymphadenopathy:     Cervical: No cervical adenopathy.  Skin:    General: Skin is warm and dry.     Findings: No erythema or rash.  Neurological:     Mental Status: She is alert and oriented to person, place, and time.  Psychiatric:        Behavior: Behavior normal.        Thought Content: Thought content normal.        Judgment: Judgment normal.      Lab Results  Component Value Date   WBC 7.9 06/16/2018   HGB 9.0 (L) 06/16/2018   HCT 30.0 (L) 06/16/2018   MCV 85.0 06/16/2018   PLT 349 06/16/2018     Chemistry      Component Value Date/Time   NA 142 04/27/2018 1131   NA 144 02/02/2017 1158   NA 140 07/24/2016 1007   K 3.5 04/27/2018 1131   K 3.7 02/02/2017 1158   K 3.6 07/24/2016 1007   CL 105 04/27/2018 1131   CL 103 02/02/2017 1158   CO2 29 04/27/2018 1131   CO2 29 02/02/2017 1158   CO2 26 07/24/2016 1007   BUN 30 (H) 04/27/2018 1131   BUN 20 02/02/2017 1158   BUN 30.3 (H) 07/24/2016 1007   CREATININE 1.18 (H) 04/27/2018 1131   CREATININE 0.9 02/02/2017 1158   CREATININE 1.1 07/24/2016 1007   GLU 129 02/02/2017      Component Value Date/Time   CALCIUM 9.4 04/27/2018 1131   CALCIUM 9.0 02/02/2017 1158   CALCIUM 9.3 07/24/2016 1007   ALKPHOS 82 04/27/2018 1131   ALKPHOS 110 (H) 02/02/2017 1158   ALKPHOS 104 07/24/2016 1007   AST 19 04/27/2018 1131   AST 13 07/24/2016 1007   ALT 20 04/27/2018 1131   ALT 26 02/02/2017 1158   ALT 15 07/24/2016 1007   BILITOT 0.6 04/27/2018 1131   BILITOT 0.55 07/24/2016 1007      Impression and Plan: Ms. Kroner is 67-year-old [place] female. She has essential thrombocythemia. She actually is Calreticulin positive.  For right now, the Hydrea is holding her platelet count okay.  I do worry that the plate count will begin to go back up again.  I would like to have her come back in  4 weeks.  I think we have to keep close tabs on her platelet count.  Maybe, we will get a a break and the anagrelide will come back onto the market sooner than July.       . Peter   Oletha Cruel, MD 4/16/202012:33 PM

## 2018-06-21 ENCOUNTER — Other Ambulatory Visit: Payer: Medicare Other

## 2018-06-21 ENCOUNTER — Ambulatory Visit: Payer: Medicare Other | Admitting: Hematology & Oncology

## 2018-07-14 ENCOUNTER — Inpatient Hospital Stay (HOSPITAL_BASED_OUTPATIENT_CLINIC_OR_DEPARTMENT_OTHER): Payer: Medicare Other | Admitting: Hematology & Oncology

## 2018-07-14 ENCOUNTER — Other Ambulatory Visit: Payer: Self-pay

## 2018-07-14 ENCOUNTER — Encounter: Payer: Self-pay | Admitting: Hematology & Oncology

## 2018-07-14 ENCOUNTER — Inpatient Hospital Stay: Payer: Medicare Other | Attending: Hematology & Oncology

## 2018-07-14 VITALS — BP 139/72 | HR 73 | Temp 98.0°F | Resp 16 | Wt 134.0 lb

## 2018-07-14 DIAGNOSIS — D473 Essential (hemorrhagic) thrombocythemia: Secondary | ICD-10-CM

## 2018-07-14 DIAGNOSIS — Z79899 Other long term (current) drug therapy: Secondary | ICD-10-CM | POA: Insufficient documentation

## 2018-07-14 DIAGNOSIS — Z7982 Long term (current) use of aspirin: Secondary | ICD-10-CM | POA: Insufficient documentation

## 2018-07-14 LAB — CBC WITH DIFFERENTIAL (CANCER CENTER ONLY)
Abs Immature Granulocytes: 0.02 10*3/uL (ref 0.00–0.07)
Basophils Absolute: 0 10*3/uL (ref 0.0–0.1)
Basophils Relative: 1 %
Eosinophils Absolute: 0 10*3/uL (ref 0.0–0.5)
Eosinophils Relative: 2 %
HCT: 25.2 % — ABNORMAL LOW (ref 36.0–46.0)
Hemoglobin: 8.1 g/dL — ABNORMAL LOW (ref 12.0–15.0)
Immature Granulocytes: 1 %
Lymphocytes Relative: 38 %
Lymphs Abs: 0.9 10*3/uL (ref 0.7–4.0)
MCH: 28.3 pg (ref 26.0–34.0)
MCHC: 32.1 g/dL (ref 30.0–36.0)
MCV: 88.1 fL (ref 80.0–100.0)
Monocytes Absolute: 0.1 10*3/uL (ref 0.1–1.0)
Monocytes Relative: 5 %
Neutro Abs: 1.3 10*3/uL — ABNORMAL LOW (ref 1.7–7.7)
Neutrophils Relative %: 53 %
Platelet Count: 132 10*3/uL — ABNORMAL LOW (ref 150–400)
RBC: 2.86 MIL/uL — ABNORMAL LOW (ref 3.87–5.11)
RDW: 24.1 % — ABNORMAL HIGH (ref 11.5–15.5)
WBC Count: 2.4 10*3/uL — ABNORMAL LOW (ref 4.0–10.5)
nRBC: 0 % (ref 0.0–0.2)

## 2018-07-14 LAB — CMP (CANCER CENTER ONLY)
ALT: 11 U/L (ref 0–44)
AST: 14 U/L — ABNORMAL LOW (ref 15–41)
Albumin: 4.3 g/dL (ref 3.5–5.0)
Alkaline Phosphatase: 69 U/L (ref 38–126)
Anion gap: 8 (ref 5–15)
BUN: 27 mg/dL — ABNORMAL HIGH (ref 8–23)
CO2: 29 mmol/L (ref 22–32)
Calcium: 9.6 mg/dL (ref 8.9–10.3)
Chloride: 102 mmol/L (ref 98–111)
Creatinine: 0.99 mg/dL (ref 0.44–1.00)
GFR, Est AFR Am: >60 mL/min (ref >60)
GFR, Estimated: 59 mL/min — ABNORMAL LOW (ref >60)
Glucose, Bld: 110 mg/dL — ABNORMAL HIGH (ref 70–99)
Potassium: 3.2 mmol/L — ABNORMAL LOW (ref 3.5–5.1)
Sodium: 139 mmol/L (ref 135–145)
Total Bilirubin: 0.8 mg/dL (ref 0.3–1.2)
Total Protein: 7.1 g/dL (ref 6.5–8.1)

## 2018-07-14 LAB — SAVE SMEAR(SSMR), FOR PROVIDER SLIDE REVIEW

## 2018-07-14 NOTE — Progress Notes (Signed)
[name]  [employer] is [employer].  Hematology and Oncology Follow Up Visit  Alexandra [name] [id number] [birth year] 68 y.o. 07/14/2018   Principle Diagnosis:   Anagrelide 7 mg po q day - dose changed 03/28/2018 --off now due to backorder of anagrelide  Hydrea 500 mg po BID - start on 07/14/2018  Aspirin 81 mg by mouth daily  Folic acid 2 mg by mouth daily   Current Therapy:    Essential thrombocythemia-Calreticulin positive  Interim History:  Alexandra [name] is back for followup.  I am very surprised by the fact that her platelet count has come down so quickly.  I really was not sure that the Hydrea would work.  She was on 1000 mg p.o. twice daily.  Her platelet count was 132,000.  Her white cell count was 2.4.  Hemoglobin 8.1.  We will adjust her Hydrea dose to 500 mg p.o. twice daily.  She still is doing okay.  She really has had no issues with nausea or vomiting.  She has had no diarrhea.  There has been no rashes.  She has had no headache. She has had no bleeding.  There is been no rashes.  She has had no leg swelling.  Of note, her last erythropoietin level back in June 2019 was 24.  As such, we could probably use if needed.  Overall, her performance status is ECOG 1.  Medications:  Current Outpatient Medications:  .  amLODipine (NORVASC) 10 MG tablet, Take 1 tablet by mouth once daily, Disp: 90 tablet, Rfl: 0 .  aspirin 81 MG tablet, Take 81 mg by mouth daily., Disp: , Rfl:  .  BYSTOLIC 20 MG TABS, TAKE 1 TABLET BY MOUTH ONCE DAILY, Disp: 90 each, Rfl: 0 .  Fe Fum-FePoly-Vit C-Vit B3 (INTEGRA) 62.5-62.5-40-3 MG CAPS, Take 1 tablet by mouth daily., Disp: 30 capsule, Rfl: 8 .  folic acid (FOLVITE) 1 MG tablet, TAKE 2 TABLETS BY MOUTH ONCE DAILY, Disp: 60 tablet, Rfl: 11 .  glucose blood (ONE TOUCH ULTRA TEST) test strip, Use to test blood sugar 1 times daily. **PT NEEDS FOLLOW UP APPT FOR FURTHER REFILLS**, Disp: 100 each, Rfl: 0 .  hydroxyurea (HYDREA) 500 MG capsule, Take 2  capsules (1,000 mg total) by mouth 2 (two) times daily. May take with food to minimize GI side effects., Disp: 120 capsule, Rfl: 2 .  lisinopril (PRINIVIL,ZESTRIL) 20 MG tablet, Take 2 tablets by mouth once daily, Disp: 180 tablet, Rfl: 0 .  ONETOUCH DELICA LANCETS 65Y MISC, Use to test blood sugar 1 time daily. **PT NEEDS FOLLOW UP APPT FOR FURTHER REFILLS**, Disp: 100 each, Rfl: 1 .  Vitamin D, Ergocalciferol, (DRISDOL) 1.25 MG (50000 UT) CAPS capsule, TAKE 1 CAPSULE BY MOUTH 1 TIME A WEEK, Disp: 12 capsule, Rfl: 0  Allergies: No Known Allergies  Past Medical History, Surgical history, Social history, and Family History were reviewed and updated.  Review of Systems: Review of Systems  All other systems reviewed and are negative. Marland Kitchen  Physical Exam:  weight is 134 lb (60.8 kg). Her oral temperature is 98 F (36.7 C). Her blood pressure is 139/72 and her pulse is 73. Her respiration is 16 and oxygen saturation is 100%.   Physical Exam Vitals signs reviewed.  HENT:     Head: Normocephalic and atraumatic.  Eyes:     Pupils: Pupils are equal, round, and reactive to light.  Neck:     Musculoskeletal: Normal range of motion.  Cardiovascular:  Rate and Rhythm: Normal rate and regular rhythm.     Heart sounds: Normal heart sounds.  Pulmonary:     Effort: Pulmonary effort is normal.     Breath sounds: Normal breath sounds.  Abdominal:     General: Bowel sounds are normal.     Palpations: Abdomen is soft.  Musculoskeletal: Normal range of motion.        General: No tenderness or deformity.  Lymphadenopathy:     Cervical: No cervical adenopathy.  Skin:    General: Skin is warm and dry.     Findings: No erythema or rash.  Neurological:     Mental Status: She is alert and oriented to person, place, and time.  Psychiatric:        Behavior: Behavior normal.        Thought Content: Thought content normal.        Judgment: Judgment normal.      Lab Results  Component Value Date    WBC 2.4 (L) 07/14/2018   HGB 8.1 (L) 07/14/2018   HCT 25.2 (L) 07/14/2018   MCV 88.1 07/14/2018   PLT 132 (L) 07/14/2018     Chemistry      Component Value Date/Time   NA 139 07/14/2018 1118   NA 144 02/02/2017 1158   NA 140 07/24/2016 1007   K 3.2 (L) 07/14/2018 1118   K 3.7 02/02/2017 1158   K 3.6 07/24/2016 1007   CL 102 07/14/2018 1118   CL 103 02/02/2017 1158   CO2 29 07/14/2018 1118   CO2 29 02/02/2017 1158   CO2 26 07/24/2016 1007   BUN 27 (H) 07/14/2018 1118   BUN 20 02/02/2017 1158   BUN 30.3 (H) 07/24/2016 1007   CREATININE 0.99 07/14/2018 1118   CREATININE 0.9 02/02/2017 1158   CREATININE 1.1 07/24/2016 1007   GLU 129 02/02/2017      Component Value Date/Time   CALCIUM 9.6 07/14/2018 1118   CALCIUM 9.0 02/02/2017 1158   CALCIUM 9.3 07/24/2016 1007   ALKPHOS 69 07/14/2018 1118   ALKPHOS 110 (H) 02/02/2017 1158   ALKPHOS 104 07/24/2016 1007   AST 14 (L) 07/14/2018 1118   AST 13 07/24/2016 1007   ALT 11 07/14/2018 1118   ALT 26 02/02/2017 1158   ALT 15 07/24/2016 1007   BILITOT 0.8 07/14/2018 1118   BILITOT 0.55 07/24/2016 1007      Impression and Plan: Alexandra [name] is 68 year old [name]. She has essential thrombocythemia. She actually is Calreticulin positive.  Hopefully, with the decrease and Hydrea dose, her platelet count will not go up too quickly but more importantly, her white cell count and red cell count will improve.  We still have to follow her closely.  I will have her come back in 3 weeks.  If everything looks better by then, then we can see about getting her back in 6 weeks.        Marland Kitchen [name], MD 5/14/202012:27 PM

## 2018-07-18 ENCOUNTER — Other Ambulatory Visit: Payer: Self-pay | Admitting: Family

## 2018-07-18 ENCOUNTER — Other Ambulatory Visit: Payer: Self-pay | Admitting: Hematology & Oncology

## 2018-07-18 DIAGNOSIS — D508 Other iron deficiency anemias: Secondary | ICD-10-CM

## 2018-07-18 DIAGNOSIS — D473 Essential (hemorrhagic) thrombocythemia: Secondary | ICD-10-CM

## 2018-08-08 ENCOUNTER — Other Ambulatory Visit: Payer: Self-pay | Admitting: Internal Medicine

## 2018-08-11 ENCOUNTER — Inpatient Hospital Stay: Payer: Medicare Other | Admitting: Hematology & Oncology

## 2018-08-11 ENCOUNTER — Other Ambulatory Visit: Payer: Self-pay

## 2018-08-11 ENCOUNTER — Inpatient Hospital Stay: Payer: Medicare Other | Attending: Hematology & Oncology

## 2018-08-11 DIAGNOSIS — Z7982 Long term (current) use of aspirin: Secondary | ICD-10-CM | POA: Insufficient documentation

## 2018-08-11 DIAGNOSIS — Z79899 Other long term (current) drug therapy: Secondary | ICD-10-CM | POA: Diagnosis not present

## 2018-08-11 DIAGNOSIS — D473 Essential (hemorrhagic) thrombocythemia: Secondary | ICD-10-CM | POA: Insufficient documentation

## 2018-08-11 LAB — CMP (CANCER CENTER ONLY)
ALT: 10 U/L (ref 0–44)
AST: 15 U/L (ref 15–41)
Albumin: 4.4 g/dL (ref 3.5–5.0)
Alkaline Phosphatase: 62 U/L (ref 38–126)
Anion gap: 9 (ref 5–15)
BUN: 22 mg/dL (ref 8–23)
CO2: 29 mmol/L (ref 22–32)
Calcium: 9.8 mg/dL (ref 8.9–10.3)
Chloride: 102 mmol/L (ref 98–111)
Creatinine: 1.13 mg/dL — ABNORMAL HIGH (ref 0.44–1.00)
GFR, Est AFR Am: 58 mL/min — ABNORMAL LOW (ref >60)
GFR, Estimated: 50 mL/min — ABNORMAL LOW (ref >60)
Glucose, Bld: 106 mg/dL — ABNORMAL HIGH (ref 70–99)
Potassium: 3.2 mmol/L — ABNORMAL LOW (ref 3.5–5.1)
Sodium: 140 mmol/L (ref 135–145)
Total Bilirubin: 0.7 mg/dL (ref 0.3–1.2)
Total Protein: 7.7 g/dL (ref 6.5–8.1)

## 2018-08-11 LAB — CBC WITH DIFFERENTIAL (CANCER CENTER ONLY)
Abs Immature Granulocytes: 0.02 10*3/uL (ref 0.00–0.07)
Basophils Absolute: 0 10*3/uL (ref 0.0–0.1)
Basophils Relative: 1 %
Eosinophils Absolute: 0 10*3/uL (ref 0.0–0.5)
Eosinophils Relative: 0 %
HCT: 27.6 % — ABNORMAL LOW (ref 36.0–46.0)
Hemoglobin: 8.9 g/dL — ABNORMAL LOW (ref 12.0–15.0)
Immature Granulocytes: 0 %
Lymphocytes Relative: 26 %
Lymphs Abs: 1.4 10*3/uL (ref 0.7–4.0)
MCH: 30.9 pg (ref 26.0–34.0)
MCHC: 32.2 g/dL (ref 30.0–36.0)
MCV: 95.8 fL (ref 80.0–100.0)
Monocytes Absolute: 0.5 10*3/uL (ref 0.1–1.0)
Monocytes Relative: 9 %
Neutro Abs: 3.6 10*3/uL (ref 1.7–7.7)
Neutrophils Relative %: 64 %
Platelet Count: 788 10*3/uL — ABNORMAL HIGH (ref 150–400)
RBC: 2.88 MIL/uL — ABNORMAL LOW (ref 3.87–5.11)
WBC Count: 5.6 10*3/uL (ref 4.0–10.5)
nRBC: 0 % (ref 0.0–0.2)

## 2018-08-12 ENCOUNTER — Telehealth: Payer: Self-pay | Admitting: *Deleted

## 2018-08-12 LAB — IRON AND TIBC
Iron: 66 ug/dL (ref 41–142)
Saturation Ratios: 28 % (ref 21–57)
TIBC: 230 ug/dL — ABNORMAL LOW (ref 236–444)
UIBC: 165 ug/dL (ref 120–384)

## 2018-08-12 LAB — ERYTHROPOIETIN: Erythropoietin: 66.7 m[IU]/mL — ABNORMAL HIGH (ref 2.6–18.5)

## 2018-08-12 LAB — FERRITIN: Ferritin: 211 ng/mL (ref 11–307)

## 2018-08-12 LAB — LACTATE DEHYDROGENASE: LDH: 209 U/L — ABNORMAL HIGH (ref 98–192)

## 2018-08-12 NOTE — Telephone Encounter (Signed)
Call placed to patient to review labs with her.  Pt states that she is currently taking Hydrea 1000 mg daily.  Dr. Marin Olp notified and order received for patient to increase Hydrea to 1000 mg twice a day and to return in ten days for labs and to see Dr. Marin Olp.  Pt appreciative of call back and has no questions or concerns at this time.

## 2018-08-22 ENCOUNTER — Other Ambulatory Visit: Payer: Self-pay

## 2018-08-22 ENCOUNTER — Other Ambulatory Visit: Payer: Self-pay | Admitting: *Deleted

## 2018-08-22 ENCOUNTER — Inpatient Hospital Stay: Payer: Medicare Other

## 2018-08-22 ENCOUNTER — Telehealth: Payer: Self-pay | Admitting: *Deleted

## 2018-08-22 ENCOUNTER — Inpatient Hospital Stay (HOSPITAL_BASED_OUTPATIENT_CLINIC_OR_DEPARTMENT_OTHER): Payer: Medicare Other | Admitting: Hematology & Oncology

## 2018-08-22 ENCOUNTER — Encounter: Payer: Self-pay | Admitting: Hematology & Oncology

## 2018-08-22 VITALS — BP 142/77 | HR 80 | Temp 98.5°F | Resp 16 | Wt 133.0 lb

## 2018-08-22 DIAGNOSIS — D473 Essential (hemorrhagic) thrombocythemia: Secondary | ICD-10-CM

## 2018-08-22 DIAGNOSIS — D469 Myelodysplastic syndrome, unspecified: Secondary | ICD-10-CM

## 2018-08-22 DIAGNOSIS — D500 Iron deficiency anemia secondary to blood loss (chronic): Secondary | ICD-10-CM

## 2018-08-22 LAB — CBC WITH DIFFERENTIAL (CANCER CENTER ONLY)
Abs Immature Granulocytes: 0.01 10*3/uL (ref 0.00–0.07)
Basophils Absolute: 0 10*3/uL (ref 0.0–0.1)
Basophils Relative: 1 %
Eosinophils Absolute: 0 10*3/uL (ref 0.0–0.5)
Eosinophils Relative: 1 %
HCT: 28.5 % — ABNORMAL LOW (ref 36.0–46.0)
Hemoglobin: 9.1 g/dL — ABNORMAL LOW (ref 12.0–15.0)
Immature Granulocytes: 0 %
Lymphocytes Relative: 36 %
Lymphs Abs: 1.3 10*3/uL (ref 0.7–4.0)
MCH: 31.4 pg (ref 26.0–34.0)
MCHC: 31.9 g/dL (ref 30.0–36.0)
MCV: 98.3 fL (ref 80.0–100.0)
Monocytes Absolute: 0.2 10*3/uL (ref 0.1–1.0)
Monocytes Relative: 5 %
Neutro Abs: 2 10*3/uL (ref 1.7–7.7)
Neutrophils Relative %: 57 %
Platelet Count: 272 10*3/uL (ref 150–400)
RBC: 2.9 MIL/uL — ABNORMAL LOW (ref 3.87–5.11)
WBC Count: 3.5 10*3/uL — ABNORMAL LOW (ref 4.0–10.5)
nRBC: 0 % (ref 0.0–0.2)

## 2018-08-22 LAB — CMP (CANCER CENTER ONLY)
ALT: 9 U/L (ref 0–44)
AST: 13 U/L — ABNORMAL LOW (ref 15–41)
Albumin: 4.3 g/dL (ref 3.5–5.0)
Alkaline Phosphatase: 65 U/L (ref 38–126)
Anion gap: 8 (ref 5–15)
BUN: 34 mg/dL — ABNORMAL HIGH (ref 8–23)
CO2: 29 mmol/L (ref 22–32)
Calcium: 9.5 mg/dL (ref 8.9–10.3)
Chloride: 101 mmol/L (ref 98–111)
Creatinine: 1.12 mg/dL — ABNORMAL HIGH (ref 0.44–1.00)
GFR, Est AFR Am: 58 mL/min — ABNORMAL LOW (ref >60)
GFR, Estimated: 50 mL/min — ABNORMAL LOW (ref >60)
Glucose, Bld: 126 mg/dL — ABNORMAL HIGH (ref 70–99)
Potassium: 3 mmol/L — CL (ref 3.5–5.1)
Sodium: 138 mmol/L (ref 135–145)
Total Bilirubin: 0.9 mg/dL (ref 0.3–1.2)
Total Protein: 7.1 g/dL (ref 6.5–8.1)

## 2018-08-22 NOTE — Progress Notes (Signed)
[name]  [employer].  Hematology and Oncology Follow Up Visit  MAHOGANY TORRANCE [id number] 12/16/1950 68 y.o. 08/22/2018   Principle Diagnosis:   Anagrelide 7 mg po q day - dose changed 03/28/2018 --off now due to backorder of anagrelide  Hydrea 500 mg po TID - start on 08/22/2018  Aspirin 81 mg by mouth daily  Folic acid 2 mg by mouth daily   Current Therapy:    Essential thrombocythemia-Calreticulin positive  Interim History:  Ms.  Caris is back for followup.  She clearly is responsive to a certain dose of Hydrea.  Once again her platelet count went from 788,000 down to 232,000.  This was with the increase of Hydrea from 500 mg p.o. twice daily up to 1000 mg p.o. twice daily.  We will try her on 500 mg p.o. TID.  I am worried about her white cell count dropping a little bit.  I do not want to see her white cell count to much in the risk of infection goes up.  She is planning on going down to [place], [place] next week.  She was goes down around July 4 holiday.  She goes down and sees her family.  She has been very cautious.  She is keep herself somewhat isolated.  She is watching what she eats.  She is watching where she goes.  She has had no fever.  She has had no change in bowel bladder habits.  There has been no rashes.  Overall, her performance status is ECOG 1.  Medications:  Current Outpatient Medications:  .  amLODipine (NORVASC) 10 MG tablet, Take 1 tablet by mouth once daily, Disp: 90 tablet, Rfl: 0 .  aspirin 81 MG tablet, Take 81 mg by mouth daily., Disp: , Rfl:  .  BYSTOLIC 20 MG TABS, Take 1 tablet by mouth once daily, Disp: 90 tablet, Rfl: 0 .  Fe Fum-FePoly-Vit C-Vit B3 (INTEGRA) 62.5-62.5-40-3 MG CAPS, TAKE 1 CAPSULE BY MOUTH DAILY, Disp: 30 capsule, Rfl: 8 .  folic acid (FOLVITE) 1 MG tablet, TAKE 2 TABLETS BY MOUTH ONCE DAILY, Disp: 60 tablet, Rfl: 11 .  glucose blood (ONE TOUCH ULTRA TEST) test strip, Use to test blood sugar 1  times daily. **PT NEEDS FOLLOW UP APPT FOR FURTHER REFILLS**, Disp: 100 each, Rfl: 0 .  hydroxyurea (HYDREA) 500 MG capsule, Take 2 capsules (1,000 mg total) by mouth 2 (two) times daily. May take with food to minimize GI side effects., Disp: 120 capsule, Rfl: 2 .  lisinopril (PRINIVIL,ZESTRIL) 20 MG tablet, Take 2 tablets by mouth once daily, Disp: 180 tablet, Rfl: 0 .  ONETOUCH DELICA LANCETS 62H MISC, Use to test blood sugar 1 time daily. **PT NEEDS FOLLOW UP APPT FOR FURTHER REFILLS**, Disp: 100 each, Rfl: 1 .  Vitamin D, Ergocalciferol, (DRISDOL) 1.25 MG (50000 UT) CAPS capsule, TAKE 1 CAPSULE BY MOUTH 1 TIME A WEEK, Disp: 12 capsule, Rfl: 0  Allergies: No Known Allergies  Past Medical History, Surgical history, Social history, and Family History were reviewed and updated.  Review of Systems: Review of Systems  All other systems reviewed and are negative. Marland Kitchen  Physical Exam:  weight is 133 lb (60.3 kg). Her oral temperature is 98.5 F (36.9 C). Her blood pressure is 142/77 (abnormal) and her pulse is 80. Her respiration is 16 and oxygen saturation is 100%. =jh  Physical Exam Vitals signs reviewed.  HENT:     Head: Normocephalic and atraumatic.  Eyes:  Pupils: Pupils are equal, round, and reactive to light.  Neck:     Musculoskeletal: Normal range of motion.  Cardiovascular:     Rate and Rhythm: Normal rate and regular rhythm.     Heart sounds: Normal heart sounds.  Pulmonary:     Effort: Pulmonary effort is normal.     Breath sounds: Normal breath sounds.  Abdominal:     General: Bowel sounds are normal.     Palpations: Abdomen is soft.  Musculoskeletal: Normal range of motion.        General: No tenderness or deformity.  Lymphadenopathy:     Cervical: No cervical adenopathy.  Skin:    General: Skin is warm and dry.     Findings: No erythema or rash.  Neurological:     Mental Status: She is alert and oriented to person, place, and time.  Psychiatric:         Behavior: Behavior normal.        Thought Content: Thought content normal.        Judgment: Judgment normal.      Lab Results  Component Value Date   WBC 3.5 (L) 08/22/2018   HGB 9.1 (L) 08/22/2018   HCT 28.5 (L) 08/22/2018   MCV 98.3 08/22/2018   PLT 272 08/22/2018     Chemistry      Component Value Date/Time   NA 140 08/11/2018 1335   NA 144 02/02/2017 1158   NA 140 07/24/2016 1007   K 3.2 (L) 08/11/2018 1335   K 3.7 02/02/2017 1158   K 3.6 07/24/2016 1007   CL 102 08/11/2018 1335   CL 103 02/02/2017 1158   CO2 29 08/11/2018 1335   CO2 29 02/02/2017 1158   CO2 26 07/24/2016 1007   BUN 22 08/11/2018 1335   BUN 20 02/02/2017 1158   BUN 30.3 (H) 07/24/2016 1007   CREATININE 1.13 (H) 08/11/2018 1335   CREATININE 0.9 02/02/2017 1158   CREATININE 1.1 07/24/2016 1007   GLU 129 02/02/2017      Component Value Date/Time   CALCIUM 9.8 08/11/2018 1335   CALCIUM 9.0 02/02/2017 1158   CALCIUM 9.3 07/24/2016 1007   ALKPHOS 62 08/11/2018 1335   ALKPHOS 110 (H) 02/02/2017 1158   ALKPHOS 104 07/24/2016 1007   AST 15 08/11/2018 1335   AST 13 07/24/2016 1007   ALT 10 08/11/2018 1335   ALT 26 02/02/2017 1158   ALT 15 07/24/2016 1007   BILITOT 0.7 08/11/2018 1335   BILITOT 0.55 07/24/2016 1007      Impression and Plan: Ms. Brubacher is 68 year old [name]. She has essential thrombocythemia. She actually is Calreticulin positive.  Again, we will increase her Hydrea dose up to 500 g p.o. 3 times daily.  We will see how this works for her.  I will have her come back in 2 weeks for lab work only.  I will then see her back myself 4 weeks.        Marland Kitchen [name], MD 6/22/20209:28 AM

## 2018-08-22 NOTE — Telephone Encounter (Signed)
Dr. Marin Olp notified of potassium of 3.0.  No new orders received at this time.

## 2018-08-29 ENCOUNTER — Other Ambulatory Visit: Payer: Self-pay | Admitting: Internal Medicine

## 2018-08-29 ENCOUNTER — Ambulatory Visit: Payer: Medicare Other | Admitting: Hematology & Oncology

## 2018-09-05 ENCOUNTER — Other Ambulatory Visit: Payer: Self-pay

## 2018-09-05 ENCOUNTER — Telehealth: Payer: Self-pay | Admitting: *Deleted

## 2018-09-05 ENCOUNTER — Inpatient Hospital Stay: Payer: Medicare Other | Attending: Hematology & Oncology

## 2018-09-05 DIAGNOSIS — D473 Essential (hemorrhagic) thrombocythemia: Secondary | ICD-10-CM

## 2018-09-05 DIAGNOSIS — Z7982 Long term (current) use of aspirin: Secondary | ICD-10-CM | POA: Diagnosis not present

## 2018-09-05 DIAGNOSIS — E119 Type 2 diabetes mellitus without complications: Secondary | ICD-10-CM | POA: Diagnosis not present

## 2018-09-05 LAB — CBC WITH DIFFERENTIAL (CANCER CENTER ONLY)
Band Neutrophils: 0 %
Basophils Absolute: 0 10*3/uL (ref 0.0–0.1)
Basophils Relative: 0 %
Eosinophils Absolute: 0 10*3/uL (ref 0.0–0.5)
Eosinophils Relative: 1 %
HCT: 25.6 % — ABNORMAL LOW (ref 36.0–46.0)
Hemoglobin: 8.4 g/dL — ABNORMAL LOW (ref 12.0–15.0)
Lymphocytes Relative: 40 %
Lymphs Abs: 0.9 10*3/uL (ref 0.7–4.0)
MCH: 33.7 pg (ref 26.0–34.0)
MCHC: 32.8 g/dL (ref 30.0–36.0)
MCV: 102.8 fL — ABNORMAL HIGH (ref 80.0–100.0)
Monocytes Absolute: 0.1 10*3/uL (ref 0.1–1.0)
Monocytes Relative: 4 %
Neutro Abs: 1.3 10*3/uL — ABNORMAL LOW (ref 1.7–7.7)
Neutrophils Relative %: 55 %
Platelet Count: 76 10*3/uL — ABNORMAL LOW (ref 150–400)
RBC: 2.49 MIL/uL — ABNORMAL LOW (ref 3.87–5.11)
WBC Count: 2.3 10*3/uL — ABNORMAL LOW (ref 4.0–10.5)
nRBC: 0 % (ref 0.0–0.2)

## 2018-09-05 NOTE — Telephone Encounter (Signed)
Called pt, reviewed with pt Hydrea dose currently taking. Pt states she is currently taking 1 tablet (500mg ) TID. Reviewed with MD, instructed pt to stop taking Hydrea x 3 days. Restart Hydrea 1 tablet (500mg ) BID. Return for labs 7/16. Pt requesting afternoon appt. Message to scheduling.

## 2018-09-05 NOTE — Telephone Encounter (Signed)
Lab results reviewed with MD, called pt lmovm with instructions. Per MD pt to stop Hyrdea x3days, Restart Hydrea 500mg  TID. Pt to return on Thursday next week to recheck lab work. Request pt to call back to confirm message and instructions received.

## 2018-09-15 ENCOUNTER — Other Ambulatory Visit: Payer: Self-pay

## 2018-09-15 ENCOUNTER — Other Ambulatory Visit: Payer: Medicare Other

## 2018-09-15 ENCOUNTER — Ambulatory Visit: Payer: Medicare Other | Admitting: Hematology & Oncology

## 2018-09-15 ENCOUNTER — Inpatient Hospital Stay: Payer: Medicare Other

## 2018-09-15 DIAGNOSIS — D473 Essential (hemorrhagic) thrombocythemia: Secondary | ICD-10-CM

## 2018-09-15 LAB — CBC WITH DIFFERENTIAL (CANCER CENTER ONLY)
Abs Immature Granulocytes: 0.01 10*3/uL (ref 0.00–0.07)
Basophils Absolute: 0 10*3/uL (ref 0.0–0.1)
Basophils Relative: 0 %
Eosinophils Absolute: 0 10*3/uL (ref 0.0–0.5)
Eosinophils Relative: 0 %
HCT: 24.6 % — ABNORMAL LOW (ref 36.0–46.0)
Hemoglobin: 7.8 g/dL — ABNORMAL LOW (ref 12.0–15.0)
Immature Granulocytes: 0 %
Lymphocytes Relative: 38 %
Lymphs Abs: 1.3 10*3/uL (ref 0.7–4.0)
MCH: 34.2 pg — ABNORMAL HIGH (ref 26.0–34.0)
MCHC: 31.7 g/dL (ref 30.0–36.0)
MCV: 107.9 fL — ABNORMAL HIGH (ref 80.0–100.0)
Monocytes Absolute: 0.8 10*3/uL (ref 0.1–1.0)
Monocytes Relative: 24 %
Neutro Abs: 1.3 10*3/uL — ABNORMAL LOW (ref 1.7–7.7)
Neutrophils Relative %: 38 %
Platelet Count: 539 10*3/uL — ABNORMAL HIGH (ref 150–400)
RBC: 2.28 MIL/uL — ABNORMAL LOW (ref 3.87–5.11)
RDW: 24.9 % — ABNORMAL HIGH (ref 11.5–15.5)
WBC Count: 3.4 10*3/uL — ABNORMAL LOW (ref 4.0–10.5)
nRBC: 0 % (ref 0.0–0.2)

## 2018-09-19 ENCOUNTER — Inpatient Hospital Stay (HOSPITAL_BASED_OUTPATIENT_CLINIC_OR_DEPARTMENT_OTHER): Payer: Medicare Other | Admitting: Hematology & Oncology

## 2018-09-19 ENCOUNTER — Encounter: Payer: Self-pay | Admitting: Hematology & Oncology

## 2018-09-19 ENCOUNTER — Other Ambulatory Visit: Payer: Self-pay

## 2018-09-19 ENCOUNTER — Inpatient Hospital Stay: Payer: Medicare Other

## 2018-09-19 VITALS — BP 136/84 | HR 80 | Temp 97.5°F | Resp 19 | Wt 133.0 lb

## 2018-09-19 DIAGNOSIS — D473 Essential (hemorrhagic) thrombocythemia: Secondary | ICD-10-CM

## 2018-09-19 DIAGNOSIS — Z7982 Long term (current) use of aspirin: Secondary | ICD-10-CM

## 2018-09-19 DIAGNOSIS — E119 Type 2 diabetes mellitus without complications: Secondary | ICD-10-CM

## 2018-09-19 LAB — CBC WITH DIFFERENTIAL (CANCER CENTER ONLY)
Abs Immature Granulocytes: 0.02 10*3/uL (ref 0.00–0.07)
Basophils Absolute: 0 10*3/uL (ref 0.0–0.1)
Basophils Relative: 0 %
Eosinophils Absolute: 0 10*3/uL (ref 0.0–0.5)
Eosinophils Relative: 0 %
HCT: 27 % — ABNORMAL LOW (ref 36.0–46.0)
Hemoglobin: 8.6 g/dL — ABNORMAL LOW (ref 12.0–15.0)
Immature Granulocytes: 1 %
Lymphocytes Relative: 34 %
Lymphs Abs: 1.4 10*3/uL (ref 0.7–4.0)
MCH: 34.4 pg — ABNORMAL HIGH (ref 26.0–34.0)
MCHC: 31.9 g/dL (ref 30.0–36.0)
MCV: 108 fL — ABNORMAL HIGH (ref 80.0–100.0)
Monocytes Absolute: 0.7 10*3/uL (ref 0.1–1.0)
Monocytes Relative: 18 %
Neutro Abs: 1.9 10*3/uL (ref 1.7–7.7)
Neutrophils Relative %: 47 %
Platelet Count: 695 10*3/uL — ABNORMAL HIGH (ref 150–400)
RBC: 2.5 MIL/uL — ABNORMAL LOW (ref 3.87–5.11)
RDW: 24.2 % — ABNORMAL HIGH (ref 11.5–15.5)
WBC Count: 4.1 10*3/uL (ref 4.0–10.5)
nRBC: 0 % (ref 0.0–0.2)

## 2018-09-19 LAB — CMP (CANCER CENTER ONLY)
ALT: 10 U/L (ref 0–44)
AST: 14 U/L — ABNORMAL LOW (ref 15–41)
Albumin: 4.3 g/dL (ref 3.5–5.0)
Alkaline Phosphatase: 68 U/L (ref 38–126)
Anion gap: 8 (ref 5–15)
BUN: 26 mg/dL — ABNORMAL HIGH (ref 8–23)
CO2: 31 mmol/L (ref 22–32)
Calcium: 9.2 mg/dL (ref 8.9–10.3)
Chloride: 104 mmol/L (ref 98–111)
Creatinine: 1.19 mg/dL — ABNORMAL HIGH (ref 0.44–1.00)
GFR, Est AFR Am: 54 mL/min — ABNORMAL LOW (ref >60)
GFR, Estimated: 47 mL/min — ABNORMAL LOW (ref >60)
Glucose, Bld: 117 mg/dL — ABNORMAL HIGH (ref 70–99)
Potassium: 3.9 mmol/L (ref 3.5–5.1)
Sodium: 143 mmol/L (ref 135–145)
Total Bilirubin: 0.7 mg/dL (ref 0.3–1.2)
Total Protein: 7.2 g/dL (ref 6.5–8.1)

## 2018-09-19 LAB — LACTATE DEHYDROGENASE: LDH: 228 U/L — ABNORMAL HIGH (ref 98–192)

## 2018-09-19 LAB — SAVE SMEAR(SSMR), FOR PROVIDER SLIDE REVIEW

## 2018-09-19 NOTE — Progress Notes (Addendum)
[name]  [employer] is [employer].  Hematology and Oncology Follow Up Visit  Alexandra [name] [id number] September 11, 1950 68 y.o. 09/19/2018   Principle Diagnosis:   Anagrelide 5 mg po q day - started on 17/20/2020   Aspirin 81 mg by mouth daily  Folic acid 2 mg by mouth daily   Current Therapy:    Essential thrombocythemia-Calreticulin positive  Interim History:  Ms.  Alexandra [name] is back for followup.  She definitely is incredibly sensitive to Hydrea.  We drop the Hydrea dose back.  She is actually taking 500 mg p.o. twice daily.  Her platelet count in 2 weeks went from 79,000 up to 700,000.  She really wants to get back on anagrelide.  She was able to find anagrelide.  As such, we will get her started on 5 mg a day.  We will have her stop the Hydrea.  I am not sure of anagrelide is available.  It was on back order for several months.  She really liked anagrelide.  It helped with her thrombocythemia.  Hopefully, we will get this back again.  Overall, she is doing pretty well.  She did not go down to [place], [place] for July 4.  She was worried about the coronavirus.  She has had no problem with bowels or bladder.  She is trying to exercise.  She is walking.  Overall, her performance status is ECOG 1.  Medications:  Current Outpatient Medications:  .  amLODipine (NORVASC) 10 MG tablet, Take 1 tablet by mouth once daily, Disp: 90 tablet, Rfl: 0 .  aspirin 81 MG tablet, Take 81 mg by mouth daily., Disp: , Rfl:  .  BYSTOLIC 20 MG TABS, Take 1 tablet by mouth once daily, Disp: 90 tablet, Rfl: 0 .  Fe Fum-FePoly-Vit C-Vit B3 (INTEGRA) 62.5-62.5-40-3 MG CAPS, TAKE 1 CAPSULE BY MOUTH DAILY, Disp: 30 capsule, Rfl: 8 .  folic acid (FOLVITE) 1 MG tablet, TAKE 2 TABLETS BY MOUTH ONCE DAILY, Disp: 60 tablet, Rfl: 11 .  glucose blood (ONE TOUCH ULTRA TEST) test strip, Use to test blood sugar 1 times daily. **PT NEEDS FOLLOW UP APPT FOR FURTHER REFILLS**, Disp: 100 each, Rfl: 0 .   hydroxyurea (HYDREA) 500 MG capsule, Take 1,000 mg by mouth daily. Take two capsules (total of 1000 mg) by mouth daily., Disp: , Rfl:  .  lisinopril (ZESTRIL) 20 MG tablet, Take 2 tablets by mouth once daily, Disp: 180 tablet, Rfl: 0 .  ONETOUCH DELICA LANCETS 97L MISC, Use to test blood sugar 1 time daily. **PT NEEDS FOLLOW UP APPT FOR FURTHER REFILLS**, Disp: 100 each, Rfl: 1 .  Vitamin D, Ergocalciferol, (DRISDOL) 1.25 MG (50000 UT) CAPS capsule, TAKE 1 CAPSULE BY MOUTH 1 TIME A WEEK, Disp: 12 capsule, Rfl: 0  Allergies: No Known Allergies  Past Medical History, Surgical history, Social history, and Family History were reviewed and updated.  Review of Systems: Review of Systems  All other systems reviewed and are negative. Alexandra [name]  Physical Exam:  weight is 133 lb (60.3 kg). Her oral temperature is 97.5 F (36.4 C) (abnormal). Her blood pressure is 136/84 and her pulse is 80. Her respiration is 19 and oxygen saturation is 100%. =jh  Physical Exam Vitals signs reviewed.  HENT:     Head: Normocephalic and atraumatic.  Eyes:     Pupils: Pupils are equal, round, and reactive to light.  Neck:     Musculoskeletal: Normal range of motion.  Cardiovascular:  Rate and Rhythm: Normal rate and regular rhythm.     Heart sounds: Normal heart sounds.  Pulmonary:     Effort: Pulmonary effort is normal.     Breath sounds: Normal breath sounds.  Abdominal:     General: Bowel sounds are normal.     Palpations: Abdomen is soft.  Musculoskeletal: Normal range of motion.        General: No tenderness or deformity.  Lymphadenopathy:     Cervical: No cervical adenopathy.  Skin:    General: Skin is warm and dry.     Findings: No erythema or rash.  Neurological:     Mental Status: She is alert and oriented to person, place, and time.  Psychiatric:        Behavior: Behavior normal.        Thought Content: Thought content normal.        Judgment: Judgment normal.      Lab Results  Component  Value Date   WBC 4.1 09/19/2018   HGB 8.6 (L) 09/19/2018   HCT 27.0 (L) 09/19/2018   MCV 108.0 (H) 09/19/2018   PLT 695 (H) 09/19/2018     Chemistry      Component Value Date/Time   NA 143 09/19/2018 1046   NA 144 02/02/2017 1158   NA 140 07/24/2016 1007   K 3.9 09/19/2018 1046   K 3.7 02/02/2017 1158   K 3.6 07/24/2016 1007   CL 104 09/19/2018 1046   CL 103 02/02/2017 1158   CO2 31 09/19/2018 1046   CO2 29 02/02/2017 1158   CO2 26 07/24/2016 1007   BUN 26 (H) 09/19/2018 1046   BUN 20 02/02/2017 1158   BUN 30.3 (H) 07/24/2016 1007   CREATININE 1.19 (H) 09/19/2018 1046   CREATININE 0.9 02/02/2017 1158   CREATININE 1.1 07/24/2016 1007   GLU 129 02/02/2017      Component Value Date/Time   CALCIUM 9.2 09/19/2018 1046   CALCIUM 9.0 02/02/2017 1158   CALCIUM 9.3 07/24/2016 1007   ALKPHOS 68 09/19/2018 1046   ALKPHOS 110 (H) 02/02/2017 1158   ALKPHOS 104 07/24/2016 1007   AST 14 (L) 09/19/2018 1046   AST 13 07/24/2016 1007   ALT 10 09/19/2018 1046   ALT 26 02/02/2017 1158   ALT 15 07/24/2016 1007   BILITOT 0.7 09/19/2018 1046   BILITOT 0.55 07/24/2016 1007      Impression and Plan: [name] is 68 year old [name]. She has essential thrombocythemia. She actually is Calreticulin positive.  We will go ahead and get the anagrelide restarted.  Hopefully, there will not be an interruption in the supply of anagrelide.  We will have her come back weekly for right now.  I talked her about using interferon.  She just does not wish to go down that road right now.  I think interferon probably would be a good idea for her as I think it would work.  I will have her come back myself in 3 weeks.       Alexandra [name] [name], MD 7/20/202012:01 PM

## 2018-09-20 ENCOUNTER — Telehealth: Payer: Self-pay | Admitting: *Deleted

## 2018-09-20 LAB — HEMOGLOBIN A1C
Hgb A1c MFr Bld: 5.4 % (ref 4.8–5.6)
Mean Plasma Glucose: 108 mg/dL

## 2018-09-20 NOTE — Telephone Encounter (Signed)
-----   Message from [name], MD sent at 09/20/2018  6:47 AM EDT ----- Call - the hemoglobin A1c is normal!!!  [place] job!!  Laurey Arrow

## 2018-09-20 NOTE — Telephone Encounter (Signed)
Notified pt of lab results. No concerns at this time.

## 2018-09-23 ENCOUNTER — Other Ambulatory Visit: Payer: Self-pay | Admitting: Internal Medicine

## 2018-09-23 DIAGNOSIS — Z1231 Encounter for screening mammogram for malignant neoplasm of breast: Secondary | ICD-10-CM

## 2018-09-26 ENCOUNTER — Other Ambulatory Visit: Payer: Self-pay

## 2018-09-26 ENCOUNTER — Inpatient Hospital Stay: Payer: Medicare Other

## 2018-09-26 DIAGNOSIS — E119 Type 2 diabetes mellitus without complications: Secondary | ICD-10-CM

## 2018-09-26 DIAGNOSIS — D473 Essential (hemorrhagic) thrombocythemia: Secondary | ICD-10-CM | POA: Diagnosis not present

## 2018-09-26 LAB — CBC WITH DIFFERENTIAL (CANCER CENTER ONLY)
Abs Immature Granulocytes: 0.09 10*3/uL — ABNORMAL HIGH (ref 0.00–0.07)
Basophils Absolute: 0 10*3/uL (ref 0.0–0.1)
Basophils Relative: 0 %
Eosinophils Absolute: 0 10*3/uL (ref 0.0–0.5)
Eosinophils Relative: 0 %
HCT: 26.7 % — ABNORMAL LOW (ref 36.0–46.0)
Hemoglobin: 8.5 g/dL — ABNORMAL LOW (ref 12.0–15.0)
Immature Granulocytes: 1 %
Lymphocytes Relative: 16 %
Lymphs Abs: 1.6 10*3/uL (ref 0.7–4.0)
MCH: 34.3 pg — ABNORMAL HIGH (ref 26.0–34.0)
MCHC: 31.8 g/dL (ref 30.0–36.0)
MCV: 107.7 fL — ABNORMAL HIGH (ref 80.0–100.0)
Monocytes Absolute: 1.7 10*3/uL — ABNORMAL HIGH (ref 0.1–1.0)
Monocytes Relative: 17 %
Neutro Abs: 6.7 10*3/uL (ref 1.7–7.7)
Neutrophils Relative %: 66 %
Platelet Count: 409 10*3/uL — ABNORMAL HIGH (ref 150–400)
RBC: 2.48 MIL/uL — ABNORMAL LOW (ref 3.87–5.11)
RDW: 21.4 % — ABNORMAL HIGH (ref 11.5–15.5)
WBC Count: 10.1 10*3/uL (ref 4.0–10.5)
nRBC: 0 % (ref 0.0–0.2)

## 2018-09-26 LAB — CMP (CANCER CENTER ONLY)
ALT: 8 U/L (ref 0–44)
AST: 12 U/L — ABNORMAL LOW (ref 15–41)
Albumin: 3.8 g/dL (ref 3.5–5.0)
Alkaline Phosphatase: 56 U/L (ref 38–126)
Anion gap: 7 (ref 5–15)
BUN: 20 mg/dL (ref 8–23)
CO2: 29 mmol/L (ref 22–32)
Calcium: 8.6 mg/dL — ABNORMAL LOW (ref 8.9–10.3)
Chloride: 99 mmol/L (ref 98–111)
Creatinine: 1.16 mg/dL — ABNORMAL HIGH (ref 0.44–1.00)
GFR, Est AFR Am: 56 mL/min — ABNORMAL LOW (ref >60)
GFR, Estimated: 48 mL/min — ABNORMAL LOW (ref >60)
Glucose, Bld: 203 mg/dL — ABNORMAL HIGH (ref 70–99)
Potassium: 3.3 mmol/L — ABNORMAL LOW (ref 3.5–5.1)
Sodium: 135 mmol/L (ref 135–145)
Total Bilirubin: 0.6 mg/dL (ref 0.3–1.2)
Total Protein: 7.1 g/dL (ref 6.5–8.1)

## 2018-09-26 LAB — SAVE SMEAR(SSMR), FOR PROVIDER SLIDE REVIEW

## 2018-09-27 ENCOUNTER — Telehealth: Payer: Self-pay | Admitting: *Deleted

## 2018-09-27 LAB — LACTATE DEHYDROGENASE: LDH: 238 U/L — ABNORMAL HIGH (ref 98–192)

## 2018-09-27 NOTE — Telephone Encounter (Signed)
-----   Message from [name], MD sent at 09/26/2018  4:03 PM EDT ----- Call - platelets are coming down nicely -- now 408K!!!  Alexandra [name]

## 2018-09-27 NOTE — Telephone Encounter (Signed)
As noted below by Dr. Marin Olp, I informed the patient of her platelet count. She verbalized understanding.

## 2018-10-03 ENCOUNTER — Other Ambulatory Visit: Payer: Self-pay

## 2018-10-03 ENCOUNTER — Telehealth: Payer: Self-pay | Admitting: *Deleted

## 2018-10-03 ENCOUNTER — Inpatient Hospital Stay: Payer: Medicare Other | Attending: Hematology & Oncology

## 2018-10-03 DIAGNOSIS — D649 Anemia, unspecified: Secondary | ICD-10-CM | POA: Insufficient documentation

## 2018-10-03 DIAGNOSIS — D473 Essential (hemorrhagic) thrombocythemia: Secondary | ICD-10-CM | POA: Insufficient documentation

## 2018-10-03 DIAGNOSIS — Z7982 Long term (current) use of aspirin: Secondary | ICD-10-CM | POA: Insufficient documentation

## 2018-10-03 DIAGNOSIS — Z79899 Other long term (current) drug therapy: Secondary | ICD-10-CM | POA: Insufficient documentation

## 2018-10-03 LAB — CBC WITH DIFFERENTIAL (CANCER CENTER ONLY)
Abs Immature Granulocytes: 0.06 10*3/uL (ref 0.00–0.07)
Basophils Absolute: 0 10*3/uL (ref 0.0–0.1)
Basophils Relative: 0 %
Eosinophils Absolute: 0.1 10*3/uL (ref 0.0–0.5)
Eosinophils Relative: 1 %
HCT: 26.2 % — ABNORMAL LOW (ref 36.0–46.0)
Hemoglobin: 8.2 g/dL — ABNORMAL LOW (ref 12.0–15.0)
Immature Granulocytes: 1 %
Lymphocytes Relative: 19 %
Lymphs Abs: 1.7 10*3/uL (ref 0.7–4.0)
MCH: 33.2 pg (ref 26.0–34.0)
MCHC: 31.3 g/dL (ref 30.0–36.0)
MCV: 106.1 fL — ABNORMAL HIGH (ref 80.0–100.0)
Monocytes Absolute: 1.6 10*3/uL — ABNORMAL HIGH (ref 0.1–1.0)
Monocytes Relative: 18 %
Neutro Abs: 5.2 10*3/uL (ref 1.7–7.7)
Neutrophils Relative %: 61 %
Platelet Count: 413 10*3/uL — ABNORMAL HIGH (ref 150–400)
RBC: 2.47 MIL/uL — ABNORMAL LOW (ref 3.87–5.11)
RDW: 20 % — ABNORMAL HIGH (ref 11.5–15.5)
WBC Count: 8.5 10*3/uL (ref 4.0–10.5)
nRBC: 0 % (ref 0.0–0.2)

## 2018-10-03 NOTE — Telephone Encounter (Signed)
Notified pt of lab results 

## 2018-10-03 NOTE — Telephone Encounter (Signed)
-----   Message from [name], MD sent at 10/03/2018 11:59 AM EDT ----- Cal - the platelets are holding steady!!!  No change in the Anagrelide dose!!  Laurey Arrow

## 2018-10-10 ENCOUNTER — Other Ambulatory Visit: Payer: Self-pay | Admitting: *Deleted

## 2018-10-10 ENCOUNTER — Encounter: Payer: Self-pay | Admitting: Hematology & Oncology

## 2018-10-10 ENCOUNTER — Other Ambulatory Visit: Payer: Self-pay

## 2018-10-10 ENCOUNTER — Inpatient Hospital Stay: Payer: Medicare Other

## 2018-10-10 ENCOUNTER — Inpatient Hospital Stay (HOSPITAL_BASED_OUTPATIENT_CLINIC_OR_DEPARTMENT_OTHER): Payer: Medicare Other | Admitting: Hematology & Oncology

## 2018-10-10 VITALS — BP 152/71 | HR 80 | Temp 97.7°F | Resp 18 | Ht 71.0 in | Wt 135.8 lb

## 2018-10-10 DIAGNOSIS — E119 Type 2 diabetes mellitus without complications: Secondary | ICD-10-CM

## 2018-10-10 DIAGNOSIS — D473 Essential (hemorrhagic) thrombocythemia: Secondary | ICD-10-CM

## 2018-10-10 LAB — CMP (CANCER CENTER ONLY)
ALT: 11 U/L (ref 0–44)
AST: 13 U/L — ABNORMAL LOW (ref 15–41)
Albumin: 3.8 g/dL (ref 3.5–5.0)
Alkaline Phosphatase: 62 U/L (ref 38–126)
Anion gap: 9 (ref 5–15)
BUN: 28 mg/dL — ABNORMAL HIGH (ref 8–23)
CO2: 27 mmol/L (ref 22–32)
Calcium: 8.9 mg/dL (ref 8.9–10.3)
Chloride: 99 mmol/L (ref 98–111)
Creatinine: 1.22 mg/dL — ABNORMAL HIGH (ref 0.44–1.00)
GFR, Est AFR Am: 53 mL/min — ABNORMAL LOW (ref >60)
GFR, Estimated: 45 mL/min — ABNORMAL LOW (ref >60)
Glucose, Bld: 121 mg/dL — ABNORMAL HIGH (ref 70–99)
Potassium: 3.7 mmol/L (ref 3.5–5.1)
Sodium: 135 mmol/L (ref 135–145)
Total Bilirubin: 0.5 mg/dL (ref 0.3–1.2)
Total Protein: 7 g/dL (ref 6.5–8.1)

## 2018-10-10 LAB — CBC WITH DIFFERENTIAL (CANCER CENTER ONLY)
Abs Immature Granulocytes: 0.21 10*3/uL — ABNORMAL HIGH (ref 0.00–0.07)
Basophils Absolute: 0.1 10*3/uL (ref 0.0–0.1)
Basophils Relative: 1 %
Eosinophils Absolute: 0.1 10*3/uL (ref 0.0–0.5)
Eosinophils Relative: 1 %
HCT: 25.4 % — ABNORMAL LOW (ref 36.0–46.0)
Hemoglobin: 8.2 g/dL — ABNORMAL LOW (ref 12.0–15.0)
Immature Granulocytes: 2 %
Lymphocytes Relative: 18 %
Lymphs Abs: 2.2 10*3/uL (ref 0.7–4.0)
MCH: 33.7 pg (ref 26.0–34.0)
MCHC: 32.3 g/dL (ref 30.0–36.0)
MCV: 104.5 fL — ABNORMAL HIGH (ref 80.0–100.0)
Monocytes Absolute: 1.8 10*3/uL — ABNORMAL HIGH (ref 0.1–1.0)
Monocytes Relative: 14 %
Neutro Abs: 8.3 10*3/uL — ABNORMAL HIGH (ref 1.7–7.7)
Neutrophils Relative %: 64 %
Platelet Count: 349 10*3/uL (ref 150–400)
RBC: 2.43 MIL/uL — ABNORMAL LOW (ref 3.87–5.11)
RDW: 18 % — ABNORMAL HIGH (ref 11.5–15.5)
WBC Count: 12.8 10*3/uL — ABNORMAL HIGH (ref 4.0–10.5)
nRBC: 0 % (ref 0.0–0.2)

## 2018-10-10 MED ORDER — ANAGRELIDE HCL 1 MG PO CAPS: 5.0000 mg | ORAL_CAPSULE | Freq: Every day | ORAL | 1 refills | Status: DC

## 2018-10-10 NOTE — Progress Notes (Signed)
[name]  [employer] is [employer].  Hematology and Oncology Follow Up Visit  TONA QUALLEY [id number] March 24, 1950 68 y.o. 10/10/2018   Principle Diagnosis:   Anagrelide 5 mg po q day - started on 17/20/2020   Aspirin 81 mg by mouth daily  Folic acid 2 mg by mouth daily   Current Therapy:    Essential thrombocythemia-Calreticulin positive  Interim History:  [name] is back for followup.  So far, she is doing quite well with the anagrelide.  This works very well for her.  She is on 5 mg a day.  This really does seem to work well for her.  Her platelet count has trended down nicely.  There is been no problems with bleeding.  She has had no change in bowel or bladder habits.  There is no nausea or vomiting.  She still has not gone down to see her family in [place], [place].  She just wishes to stay home.  She has had no headache.  Last iron studies back in June showed a ferritin of 211 with an iron saturation of 28%.  Overall, her performance status is ECOG 1.  Medications:  Current Outpatient Medications:  .  amLODipine (NORVASC) 10 MG tablet, Take 1 tablet by mouth once daily, Disp: 90 tablet, Rfl: 0 .  aspirin 81 MG tablet, Take 81 mg by mouth daily., Disp: , Rfl:  .  BYSTOLIC 20 MG TABS, Take 1 tablet by mouth once daily, Disp: 90 tablet, Rfl: 0 .  Fe Fum-FePoly-Vit C-Vit B3 (INTEGRA) 62.5-62.5-40-3 MG CAPS, TAKE 1 CAPSULE BY MOUTH DAILY, Disp: 30 capsule, Rfl: 8 .  folic acid (FOLVITE) 1 MG tablet, TAKE 2 TABLETS BY MOUTH ONCE DAILY, Disp: 60 tablet, Rfl: 11 .  glucose blood (ONE TOUCH ULTRA TEST) test strip, Use to test blood sugar 1 times daily. **PT NEEDS FOLLOW UP APPT FOR FURTHER REFILLS**, Disp: 100 each, Rfl: 0 .  lisinopril (ZESTRIL) 20 MG tablet, Take 2 tablets by mouth once daily, Disp: 180 tablet, Rfl: 0 .  ONETOUCH DELICA LANCETS 53Z MISC, Use to test blood sugar 1 time daily. **PT NEEDS FOLLOW UP APPT FOR FURTHER REFILLS**, Disp: 100 each, Rfl:  1 .  Vitamin D, Ergocalciferol, (DRISDOL) 1.25 MG (50000 UT) CAPS capsule, TAKE 1 CAPSULE BY MOUTH 1 TIME A WEEK, Disp: 12 capsule, Rfl: 0  Allergies: No Known Allergies  Past Medical History, Surgical history, Social history, and Family History were reviewed and updated.  Review of Systems: Review of Systems  All other systems reviewed and are negative. Marland Kitchen  Physical Exam:  height is 5\' 11"  (1.803 m) and weight is 135 lb 12.8 oz (61.6 kg). Her temporal temperature is 97.7 F (36.5 C). Her blood pressure is 152/71 (abnormal) and her pulse is 80. Her respiration is 18 and oxygen saturation is 100%. =jh  Physical Exam Vitals signs reviewed.  HENT:     Head: Normocephalic and atraumatic.  Eyes:     Pupils: Pupils are equal, round, and reactive to light.  Neck:     Musculoskeletal: Normal range of motion.  Cardiovascular:     Rate and Rhythm: Normal rate and regular rhythm.     Heart sounds: Normal heart sounds.  Pulmonary:     Effort: Pulmonary effort is normal.     Breath sounds: Normal breath sounds.  Abdominal:     General: Bowel sounds are normal.     Palpations: Abdomen is soft.  Musculoskeletal: Normal range of  motion.        General: No tenderness or deformity.  Lymphadenopathy:     Cervical: No cervical adenopathy.  Skin:    General: Skin is warm and dry.     Findings: No erythema or rash.  Neurological:     Mental Status: She is alert and oriented to person, place, and time.  Psychiatric:        Behavior: Behavior normal.        Thought Content: Thought content normal.        Judgment: Judgment normal.      Lab Results  Component Value Date   WBC 12.8 (H) 10/10/2018   HGB 8.2 (L) 10/10/2018   HCT 25.4 (L) 10/10/2018   MCV 104.5 (H) 10/10/2018   PLT 349 10/10/2018     Chemistry      Component Value Date/Time   NA 135 10/10/2018 1410   NA 144 02/02/2017 1158   NA 140 07/24/2016 1007   K 3.7 10/10/2018 1410   K 3.7 02/02/2017 1158   K 3.6 07/24/2016  1007   CL 99 10/10/2018 1410   CL 103 02/02/2017 1158   CO2 27 10/10/2018 1410   CO2 29 02/02/2017 1158   CO2 26 07/24/2016 1007   BUN 28 (H) 10/10/2018 1410   BUN 20 02/02/2017 1158   BUN 30.3 (H) 07/24/2016 1007   CREATININE 1.22 (H) 10/10/2018 1410   CREATININE 0.9 02/02/2017 1158   CREATININE 1.1 07/24/2016 1007   GLU 129 02/02/2017      Component Value Date/Time   CALCIUM 8.9 10/10/2018 1410   CALCIUM 9.0 02/02/2017 1158   CALCIUM 9.3 07/24/2016 1007   ALKPHOS 62 10/10/2018 1410   ALKPHOS 110 (H) 02/02/2017 1158   ALKPHOS 104 07/24/2016 1007   AST 13 (L) 10/10/2018 1410   AST 13 07/24/2016 1007   ALT 11 10/10/2018 1410   ALT 26 02/02/2017 1158   ALT 15 07/24/2016 1007   BILITOT 0.5 10/10/2018 1410   BILITOT 0.55 07/24/2016 1007      Impression and Plan: Ms. Mccutcheon is 68 year old [name]. She has essential thrombocythemia. She actually is Calreticulin positive.  I am just happy that she has the anagrelide.  This has worked well for her.  She is still quite anemic.  She really is not symptomatic with this however.  We have talked her about Aranesp.  She really does not wish to try Aranesp.  She really does not wish to have injections.  If her blood does get lower, I think Aranesp will clearly be helpful since her erythropoietin level is only 66.  I think we can now get her back in 6 weeks.   Marland Kitchen [name], MD 8/10/20203:00 PM

## 2018-10-11 ENCOUNTER — Other Ambulatory Visit: Payer: Self-pay | Admitting: Family

## 2018-10-17 ENCOUNTER — Other Ambulatory Visit: Payer: Self-pay

## 2018-10-17 ENCOUNTER — Ambulatory Visit
Admission: RE | Admit: 2018-10-17 | Discharge: 2018-10-17 | Disposition: A | Discharge status: Home / Self Care | Payer: Medicare Other | Source: Ambulatory Visit | Attending: Internal Medicine | Admitting: Internal Medicine

## 2018-10-17 DIAGNOSIS — Z1231 Encounter for screening mammogram for malignant neoplasm of breast: Secondary | ICD-10-CM

## 2018-11-28 ENCOUNTER — Other Ambulatory Visit: Payer: Self-pay | Admitting: Hematology & Oncology

## 2018-11-28 ENCOUNTER — Inpatient Hospital Stay: Payer: Medicare Other

## 2018-11-28 ENCOUNTER — Encounter: Payer: Self-pay | Admitting: Hematology & Oncology

## 2018-11-28 ENCOUNTER — Other Ambulatory Visit: Payer: Self-pay

## 2018-11-28 ENCOUNTER — Inpatient Hospital Stay: Payer: Medicare Other | Attending: Hematology & Oncology | Admitting: Hematology & Oncology

## 2018-11-28 VITALS — BP 127/70 | HR 106 | Temp 98.9°F | Resp 18 | Wt 132.0 lb

## 2018-11-28 DIAGNOSIS — Z79899 Other long term (current) drug therapy: Secondary | ICD-10-CM | POA: Diagnosis not present

## 2018-11-28 DIAGNOSIS — D473 Essential (hemorrhagic) thrombocythemia: Secondary | ICD-10-CM | POA: Diagnosis not present

## 2018-11-28 DIAGNOSIS — Z7982 Long term (current) use of aspirin: Secondary | ICD-10-CM | POA: Diagnosis not present

## 2018-11-28 LAB — CBC WITH DIFFERENTIAL (CANCER CENTER ONLY)
Abs Immature Granulocytes: 0.08 10*3/uL — ABNORMAL HIGH (ref 0.00–0.07)
Basophils Absolute: 0 10*3/uL (ref 0.0–0.1)
Basophils Relative: 0 %
Eosinophils Absolute: 0.1 10*3/uL (ref 0.0–0.5)
Eosinophils Relative: 1 %
HCT: 31.5 % — ABNORMAL LOW (ref 36.0–46.0)
Hemoglobin: 9.7 g/dL — ABNORMAL LOW (ref 12.0–15.0)
Immature Granulocytes: 1 %
Lymphocytes Relative: 21 %
Lymphs Abs: 1.7 10*3/uL (ref 0.7–4.0)
MCH: 28.8 pg (ref 26.0–34.0)
MCHC: 30.8 g/dL (ref 30.0–36.0)
MCV: 93.5 fL (ref 80.0–100.0)
Monocytes Absolute: 0.6 10*3/uL (ref 0.1–1.0)
Monocytes Relative: 8 %
Neutro Abs: 5.7 10*3/uL (ref 1.7–7.7)
Neutrophils Relative %: 69 %
Platelet Count: 444 10*3/uL — ABNORMAL HIGH (ref 150–400)
RBC: 3.37 MIL/uL — ABNORMAL LOW (ref 3.87–5.11)
RDW: 15.8 % — ABNORMAL HIGH (ref 11.5–15.5)
WBC Count: 8.2 10*3/uL (ref 4.0–10.5)
nRBC: 0 % (ref 0.0–0.2)

## 2018-11-28 LAB — CMP (CANCER CENTER ONLY)
ALT: 12 U/L (ref 0–44)
AST: 16 U/L (ref 15–41)
Albumin: 4.2 g/dL (ref 3.5–5.0)
Alkaline Phosphatase: 87 U/L (ref 38–126)
Anion gap: 8 (ref 5–15)
BUN: 30 mg/dL — ABNORMAL HIGH (ref 8–23)
CO2: 29 mmol/L (ref 22–32)
Calcium: 9.7 mg/dL (ref 8.9–10.3)
Chloride: 104 mmol/L (ref 98–111)
Creatinine: 1.28 mg/dL — ABNORMAL HIGH (ref 0.44–1.00)
GFR, Est AFR Am: 50 mL/min — ABNORMAL LOW (ref >60)
GFR, Estimated: 43 mL/min — ABNORMAL LOW (ref >60)
Glucose, Bld: 154 mg/dL — ABNORMAL HIGH (ref 70–99)
Potassium: 3.7 mmol/L (ref 3.5–5.1)
Sodium: 141 mmol/L (ref 135–145)
Total Bilirubin: 0.5 mg/dL (ref 0.3–1.2)
Total Protein: 7.9 g/dL (ref 6.5–8.1)

## 2018-11-28 LAB — RETICULOCYTES
Immature Retic Fract: 7.5 % (ref 2.3–15.9)
RBC.: 3.35 MIL/uL — ABNORMAL LOW (ref 3.87–5.11)
Retic Count, Absolute: 25.1 10*3/uL (ref 19.0–186.0)
Retic Ct Pct: 0.8 % (ref 0.4–3.1)

## 2018-11-28 LAB — SAVE SMEAR(SSMR), FOR PROVIDER SLIDE REVIEW

## 2018-11-28 LAB — LACTATE DEHYDROGENASE: LDH: 223 U/L — ABNORMAL HIGH (ref 98–192)

## 2018-11-28 NOTE — Progress Notes (Signed)
Plan [place] [name]  It is a [employer].  Hematology and Oncology Follow Up Visit  Alexandra [name] [id number] December 06, 1950 68 y.o. 11/28/2018   Principle Diagnosis:   Anagrelide 5 mg po q day - started on 17/20/2020   Aspirin 81 mg by mouth daily  Folic acid 2 mg by mouth daily   Current Therapy:    Essential thrombocythemia-Calreticulin positive  Interim History:  [name] is back for followup.  So far, she is doing quite well with the anagrelide.  This works very well for her.  She is on 5 mg a day.  This really does seem to work well for her.  Her platelet count seems to be holding pretty steady.  She was able to make it down to [name].  Her older sister is not doing too well.  Sounds like she has COPD and is on oxygen and needs assistance.  We did go ahead and give her a prayer blanket to take down to her sister which I think would help her with her faith and give her some more strength.  She is exercising.  She likes to walk.  She is eating well.  She has had no problems with bowels or bladder.  There has been no bleeding.  She has had no leg swelling.  Overall, her performance status is ECOG 1.  Medications:  Current Outpatient Medications:  .  amLODipine (NORVASC) 10 MG tablet, Take 1 tablet by mouth once daily, Disp: 90 tablet, Rfl: 0 .  anagrelide (AGRYLIN) 1 MG capsule, Take 5 capsules (5 mg total) by mouth daily., Disp: 150 capsule, Rfl: 1 .  aspirin 81 MG tablet, Take 81 mg by mouth daily., Disp: , Rfl:  .  BYSTOLIC 20 MG TABS, Take 1 tablet by mouth once daily, Disp: 90 tablet, Rfl: 0 .  Fe Fum-FePoly-Vit C-Vit B3 (INTEGRA) 62.5-62.5-40-3 MG CAPS, TAKE 1 CAPSULE BY MOUTH DAILY, Disp: 30 capsule, Rfl: 8 .  folic acid (FOLVITE) 1 MG tablet, TAKE 2 TABLETS BY MOUTH ONCE DAILY, Disp: 60 tablet, Rfl: 11 .  glucose blood (ONE TOUCH ULTRA TEST) test strip, Use to test blood sugar 1 times daily. **PT NEEDS FOLLOW UP APPT FOR FURTHER REFILLS**, Disp: 100  each, Rfl: 0 .  lisinopril (ZESTRIL) 20 MG tablet, Take 2 tablets by mouth once daily, Disp: 180 tablet, Rfl: 0 .  ONETOUCH DELICA LANCETS 99991111 MISC, Use to test blood sugar 1 time daily. **PT NEEDS FOLLOW UP APPT FOR FURTHER REFILLS**, Disp: 100 each, Rfl: 1 .  Vitamin D, Ergocalciferol, (DRISDOL) 1.25 MG (50000 UT) CAPS capsule, TAKE 1 CAPSULE BY MOUTH 1 TIME A WEEK, Disp: 12 capsule, Rfl: 0  Allergies: No Known Allergies  Past Medical History, Surgical history, Social history, and Family History were reviewed and updated.  Review of Systems: Review of Systems  All other systems reviewed and are negative. Marland Kitchen  Physical Exam:  weight is 132 lb (59.9 kg). Her temporal temperature is 98.9 F (37.2 C). Her blood pressure is 127/70 and her pulse is 106 (abnormal). Her respiration is 18 and oxygen saturation is 100%. =jh  Physical Exam Vitals signs reviewed.  HENT:     Head: Normocephalic and atraumatic.  Eyes:     Pupils: Pupils are equal, round, and reactive to light.  Neck:     Musculoskeletal: Normal range of motion.  Cardiovascular:     Rate and Rhythm: Normal rate and regular rhythm.     Heart sounds: Normal heart  sounds.  Pulmonary:     Effort: Pulmonary effort is normal.     Breath sounds: Normal breath sounds.  Abdominal:     General: Bowel sounds are normal.     Palpations: Abdomen is soft.  Musculoskeletal: Normal range of motion.        General: No tenderness or deformity.  Lymphadenopathy:     Cervical: No cervical adenopathy.  Skin:    General: Skin is warm and dry.     Findings: No erythema or rash.  Neurological:     Mental Status: She is alert and oriented to person, place, and time.  Psychiatric:        Behavior: Behavior normal.        Thought Content: Thought content normal.        Judgment: Judgment normal.      Lab Results  Component Value Date   WBC 8.2 11/28/2018   HGB 9.7 (L) 11/28/2018   HCT 31.5 (L) 11/28/2018   MCV 93.5 11/28/2018   PLT  444 (H) 11/28/2018     Chemistry      Component Value Date/Time   NA 141 11/28/2018 1207   NA 144 02/02/2017 1158   NA 140 07/24/2016 1007   K 3.7 11/28/2018 1207   K 3.7 02/02/2017 1158   K 3.6 07/24/2016 1007   CL 104 11/28/2018 1207   CL 103 02/02/2017 1158   CO2 29 11/28/2018 1207   CO2 29 02/02/2017 1158   CO2 26 07/24/2016 1007   BUN 30 (H) 11/28/2018 1207   BUN 20 02/02/2017 1158   BUN 30.3 (H) 07/24/2016 1007   CREATININE 1.28 (H) 11/28/2018 1207   CREATININE 0.9 02/02/2017 1158   CREATININE 1.1 07/24/2016 1007   GLU 129 02/02/2017      Component Value Date/Time   CALCIUM 9.7 11/28/2018 1207   CALCIUM 9.0 02/02/2017 1158   CALCIUM 9.3 07/24/2016 1007   ALKPHOS 87 11/28/2018 1207   ALKPHOS 110 (H) 02/02/2017 1158   ALKPHOS 104 07/24/2016 1007   AST 16 11/28/2018 1207   AST 13 07/24/2016 1007   ALT 12 11/28/2018 1207   ALT 26 02/02/2017 1158   ALT 15 07/24/2016 1007   BILITOT 0.5 11/28/2018 1207   BILITOT 0.55 07/24/2016 1007      Impression and Plan: Alexandra [name] is 68 year old [name]. She has essential thrombocythemia. She actually is Calreticulin positive.  From my point of view, everything looks quite good.  Her platelet count is up a little bit but still I think it is not high enough that we need to make any dosage adjustment for her.  Her hemoglobin is better.  I am happy about this.  I think we can now get her back in November.  I just do not think we need to see her back before hand.  Everything is stabilized.  I know she will be busy going down to [name] to help out with her sister.  I am just happy that her quality of life has improved a little bit.     Marland Kitchen [name], MD 9/28/20201:27 PM

## 2018-11-29 LAB — IRON AND TIBC
Iron: 96 ug/dL (ref 41–142)
Saturation Ratios: 45 % (ref 21–57)
TIBC: 214 ug/dL — ABNORMAL LOW (ref 236–444)
UIBC: 118 ug/dL — ABNORMAL LOW (ref 120–384)

## 2018-11-29 LAB — FERRITIN: Ferritin: 301 ng/mL (ref 11–307)

## 2018-12-27 ENCOUNTER — Other Ambulatory Visit: Payer: Self-pay | Admitting: Internal Medicine

## 2018-12-31 ENCOUNTER — Other Ambulatory Visit: Payer: Self-pay | Admitting: Hematology & Oncology

## 2018-12-31 DIAGNOSIS — D473 Essential (hemorrhagic) thrombocythemia: Secondary | ICD-10-CM

## 2019-01-07 ENCOUNTER — Other Ambulatory Visit: Payer: Self-pay | Admitting: Family

## 2019-01-19 ENCOUNTER — Ambulatory Visit: Payer: Medicare Other | Admitting: Hematology & Oncology

## 2019-01-19 ENCOUNTER — Other Ambulatory Visit: Payer: Medicare Other

## 2019-01-25 ENCOUNTER — Inpatient Hospital Stay: Payer: Medicare Other | Attending: Hematology & Oncology

## 2019-01-25 ENCOUNTER — Telehealth: Payer: Self-pay | Admitting: Hematology & Oncology

## 2019-01-25 ENCOUNTER — Inpatient Hospital Stay (HOSPITAL_BASED_OUTPATIENT_CLINIC_OR_DEPARTMENT_OTHER): Payer: Medicare Other | Admitting: Hematology & Oncology

## 2019-01-25 ENCOUNTER — Encounter: Payer: Self-pay | Admitting: Hematology & Oncology

## 2019-01-25 ENCOUNTER — Other Ambulatory Visit: Payer: Self-pay

## 2019-01-25 VITALS — BP 137/79 | HR 85 | Temp 97.3°F | Resp 18 | Wt 134.0 lb

## 2019-01-25 DIAGNOSIS — Z79899 Other long term (current) drug therapy: Secondary | ICD-10-CM | POA: Insufficient documentation

## 2019-01-25 DIAGNOSIS — Z7982 Long term (current) use of aspirin: Secondary | ICD-10-CM | POA: Insufficient documentation

## 2019-01-25 DIAGNOSIS — D473 Essential (hemorrhagic) thrombocythemia: Secondary | ICD-10-CM | POA: Diagnosis present

## 2019-01-25 LAB — CMP (CANCER CENTER ONLY)
ALT: 13 U/L (ref 0–44)
AST: 15 U/L (ref 15–41)
Albumin: 4.4 g/dL (ref 3.5–5.0)
Alkaline Phosphatase: 83 U/L (ref 38–126)
Anion gap: 8 (ref 5–15)
BUN: 28 mg/dL — ABNORMAL HIGH (ref 8–23)
CO2: 29 mmol/L (ref 22–32)
Calcium: 9.5 mg/dL (ref 8.9–10.3)
Chloride: 104 mmol/L (ref 98–111)
Creatinine: 1.11 mg/dL — ABNORMAL HIGH (ref 0.44–1.00)
GFR, Est AFR Am: 59 mL/min — ABNORMAL LOW (ref >60)
GFR, Estimated: 51 mL/min — ABNORMAL LOW (ref >60)
Glucose, Bld: 120 mg/dL — ABNORMAL HIGH (ref 70–99)
Potassium: 3.5 mmol/L (ref 3.5–5.1)
Sodium: 141 mmol/L (ref 135–145)
Total Bilirubin: 0.5 mg/dL (ref 0.3–1.2)
Total Protein: 7.8 g/dL (ref 6.5–8.1)

## 2019-01-25 LAB — LACTATE DEHYDROGENASE: LDH: 204 U/L — ABNORMAL HIGH (ref 98–192)

## 2019-01-25 LAB — CBC WITH DIFFERENTIAL (CANCER CENTER ONLY)
Abs Immature Granulocytes: 0.03 10*3/uL (ref 0.00–0.07)
Basophils Absolute: 0.1 10*3/uL (ref 0.0–0.1)
Basophils Relative: 1 %
Eosinophils Absolute: 0.1 10*3/uL (ref 0.0–0.5)
Eosinophils Relative: 1 %
HCT: 28 % — ABNORMAL LOW (ref 36.0–46.0)
Hemoglobin: 9 g/dL — ABNORMAL LOW (ref 12.0–15.0)
Immature Granulocytes: 0 %
Lymphocytes Relative: 20 %
Lymphs Abs: 1.4 10*3/uL (ref 0.7–4.0)
MCH: 26.3 pg (ref 26.0–34.0)
MCHC: 32.1 g/dL (ref 30.0–36.0)
MCV: 81.9 fL (ref 80.0–100.0)
Monocytes Absolute: 0.8 10*3/uL (ref 0.1–1.0)
Monocytes Relative: 11 %
Neutro Abs: 4.7 10*3/uL (ref 1.7–7.7)
Neutrophils Relative %: 67 %
Platelet Count: 322 10*3/uL (ref 150–400)
RBC: 3.42 MIL/uL — ABNORMAL LOW (ref 3.87–5.11)
RDW: 15.6 % — ABNORMAL HIGH (ref 11.5–15.5)
WBC Count: 7.1 10*3/uL (ref 4.0–10.5)
nRBC: 0 % (ref 0.0–0.2)

## 2019-01-25 LAB — IRON AND TIBC
Iron: 69 ug/dL (ref 41–142)
Saturation Ratios: 31 % (ref 21–57)
TIBC: 219 ug/dL — ABNORMAL LOW (ref 236–444)
UIBC: 150 ug/dL (ref 120–384)

## 2019-01-25 LAB — SAVE SMEAR(SSMR), FOR PROVIDER SLIDE REVIEW

## 2019-01-25 LAB — FERRITIN: Ferritin: <4 ng/mL — ABNORMAL LOW (ref 11–307)

## 2019-01-25 NOTE — Progress Notes (Signed)
[name]  It is a [employer].  Hematology and Oncology Follow Up Visit  Alexandra [name]:[id number] 19-Jan-1951 68 y.o. 01/25/2019   Principle Diagnosis:   Anagrelide 5 mg po q day - started on 17/20/2020   Aspirin 81 mg by mouth daily  Folic acid 2 mg by mouth daily   Current Therapy:    Essential thrombocythemia-Calreticulin positive  Interim History:  Ms.  [place]andra [name] is back for followup.  So far, everything is going pretty well for her.  She is not been able to make it down to [place] to see her sister who is having a lot of health issues.  She is doing well with her anagrelide.  This works very nicely for her.  Her platelets respond nicely.  She has had no bleeding.  There is been no bruising.  She has had no problems with her appetite.  She is looking forward to a nice Thanksgiving tomorrow.  She has had no fever.  There is been no cough.  She has had no headache.  Overall, her performance status is ECOG 1.  Medications:  Current Outpatient Medications:  .  anagrelide (AGRYLIN) 1 MG capsule, Take 5 mg by mouth 2 (two) times daily. Take 5 capsules by moth twice a day. , Disp: , Rfl:  .  amLODipine (NORVASC) 10 MG tablet, Take 1 tablet by mouth once daily, Disp: 30 tablet, Rfl: 0 .  aspirin 81 MG tablet, Take 81 mg by mouth daily., Disp: , Rfl:  .  BYSTOLIC 20 MG TABS, Take 1 tablet by mouth once daily, Disp: 90 tablet, Rfl: 0 .  Fe Fum-FePoly-Vit C-Vit B3 (INTEGRA) 62.5-62.5-40-3 MG CAPS, TAKE 1 CAPSULE BY MOUTH DAILY, Disp: 30 capsule, Rfl: 8 .  folic acid (FOLVITE) 1 MG tablet, TAKE 2 TABLETS BY MOUTH ONCE DAILY, Disp: 60 tablet, Rfl: 11 .  glucose blood (ONE TOUCH ULTRA TEST) test strip, Use to test blood sugar 1 times daily. **PT NEEDS FOLLOW UP APPT FOR FURTHER REFILLS**, Disp: 100 each, Rfl: 0 .  lisinopril (ZESTRIL) 20 MG tablet, Take 2 tablets by mouth once daily, Disp: 60 tablet, Rfl: 0 .  ONETOUCH DELICA LANCETS 99991111 MISC, Use to test blood sugar 1 time  daily. **PT NEEDS FOLLOW UP APPT FOR FURTHER REFILLS**, Disp: 100 each, Rfl: 1 .  Vitamin D, Ergocalciferol, (DRISDOL) 1.25 MG (50000 UT) CAPS capsule, TAKE 1 CAPSULE BY MOUTH 1 TIME A WEEK, Disp: 12 capsule, Rfl: 0  Allergies: No Known Allergies  Past Medical History, Surgical history, Social history, and Family History were reviewed and updated.  Review of Systems: Review of Systems  All other systems reviewed and are negative. Marland Kitchen  Physical Exam:  weight is 134 lb (60.8 kg). Her temporal temperature is 97.3 F (36.3 C) (abnormal). Her blood pressure is 137/79 and her pulse is 85. Her respiration is 18 and oxygen saturation is 100%. =jh  Physical Exam Vitals signs reviewed.  HENT:     Head: Normocephalic and atraumatic.  Eyes:     Pupils: Pupils are equal, round, and reactive to light.  Neck:     Musculoskeletal: Normal range of motion.  Cardiovascular:     Rate and Rhythm: Normal rate and regular rhythm.     Heart sounds: Normal heart sounds.  Pulmonary:     Effort: Pulmonary effort is normal.     Breath sounds: Normal breath sounds.  Abdominal:     General: Bowel sounds are normal.     Palpations:  Abdomen is soft.  Musculoskeletal: Normal range of motion.        General: No tenderness or deformity.  Lymphadenopathy:     Cervical: No cervical adenopathy.  Skin:    General: Skin is warm and dry.     Findings: No erythema or rash.  Neurological:     Mental Status: She is alert and oriented to person, place, and time.  Psychiatric:        Behavior: Behavior normal.        Thought Content: Thought content normal.        Judgment: Judgment normal.      Lab Results  Component Value Date   WBC 7.1 01/25/2019   HGB 9.0 (L) 01/25/2019   HCT 28.0 (L) 01/25/2019   MCV 81.9 01/25/2019   PLT 322 01/25/2019     Chemistry      Component Value Date/Time   NA 141 01/25/2019 0914   NA 144 02/02/2017 1158   NA 140 07/24/2016 1007   K 3.5 01/25/2019 0914   K 3.7  02/02/2017 1158   K 3.6 07/24/2016 1007   CL 104 01/25/2019 0914   CL 103 02/02/2017 1158   CO2 29 01/25/2019 0914   CO2 29 02/02/2017 1158   CO2 26 07/24/2016 1007   BUN 28 (H) 01/25/2019 0914   BUN 20 02/02/2017 1158   BUN 30.3 (H) 07/24/2016 1007   CREATININE 1.11 (H) 01/25/2019 0914   CREATININE 0.9 02/02/2017 1158   CREATININE 1.1 07/24/2016 1007   GLU 129 02/02/2017      Component Value Date/Time   CALCIUM 9.5 01/25/2019 0914   CALCIUM 9.0 02/02/2017 1158   CALCIUM 9.3 07/24/2016 1007   ALKPHOS 83 01/25/2019 0914   ALKPHOS 110 (H) 02/02/2017 1158   ALKPHOS 104 07/24/2016 1007   AST 15 01/25/2019 0914   AST 13 07/24/2016 1007   ALT 13 01/25/2019 0914   ALT 26 02/02/2017 1158   ALT 15 07/24/2016 1007   BILITOT 0.5 01/25/2019 0914   BILITOT 0.55 07/24/2016 1007      Impression and Plan: [name] is 68 year old [name]. She has essential thrombocythemia. She actually is Calreticulin positive.  I am just very pleased that her platelet count is holding steady.  I do not see that we had to make a change with the anagrelide dose.  She is tolerating this well.  We will now get her back in 3 months.  Hopefully, she will maintain her platelet count in the 3 months that we see her.  She is going to try to make it down to [place] to see her sister.  Marland Kitchen [name], MD 11/25/20209:47 AM

## 2019-01-25 NOTE — Telephone Encounter (Signed)
Appointments scheduled calendar printed per 11/25 los °

## 2019-02-06 ENCOUNTER — Other Ambulatory Visit: Payer: Self-pay | Admitting: Family

## 2019-02-06 ENCOUNTER — Other Ambulatory Visit: Payer: Self-pay | Admitting: Internal Medicine

## 2019-02-07 ENCOUNTER — Other Ambulatory Visit: Payer: Self-pay | Admitting: Family

## 2019-02-07 DIAGNOSIS — E559 Vitamin D deficiency, unspecified: Secondary | ICD-10-CM

## 2019-02-07 MED ORDER — VITAMIN D (ERGOCALCIFEROL) 1.25 MG (50000 UNIT) PO CAPS: ORAL_CAPSULE | ORAL | 2 refills | Status: DC

## 2019-03-06 ENCOUNTER — Telehealth: Payer: Self-pay | Admitting: *Deleted

## 2019-03-06 NOTE — Telephone Encounter (Signed)
Copied from [place] [id number]. Topic: General - Inquiry >> Mar 06, 2019  8:53 AM Mathis Bud wrote: Reason for CRM: Patient had to cancel appt for 2022/03/21, due to a death in family.  Patient wants to reschedule, PCP does not have anything in office till mid/end Jan.  Patient does not want to wait that long.  Appt was for a medication refill. Call back [phone]

## 2019-03-07 ENCOUNTER — Ambulatory Visit: Payer: Medicare Other | Admitting: Internal Medicine

## 2019-03-07 NOTE — Telephone Encounter (Signed)
Condolences about the death in family  Can put in for in person  Yearly visit  cpx  Wednesday Jan 13 at either 10    10 30  or 11 am

## 2019-03-08 NOTE — Telephone Encounter (Signed)
Lvm for pt to call back to schedule appt okay to disclose and then transfer to office to schedule crm created

## 2019-03-13 ENCOUNTER — Ambulatory Visit: Payer: Medicare PPO | Admitting: Internal Medicine

## 2019-03-13 ENCOUNTER — Other Ambulatory Visit: Payer: Self-pay

## 2019-03-13 ENCOUNTER — Encounter: Payer: Self-pay | Admitting: Internal Medicine

## 2019-03-13 VITALS — BP 130/72 | HR 96 | Temp 97.5°F | Ht 71.0 in | Wt 133.5 lb

## 2019-03-13 DIAGNOSIS — Z5320 Procedure and treatment not carried out because of patient's decision for unspecified reasons: Secondary | ICD-10-CM

## 2019-03-13 DIAGNOSIS — Z79899 Other long term (current) drug therapy: Secondary | ICD-10-CM

## 2019-03-13 DIAGNOSIS — R011 Cardiac murmur, unspecified: Secondary | ICD-10-CM | POA: Diagnosis not present

## 2019-03-13 DIAGNOSIS — Z2821 Immunization not carried out because of patient refusal: Secondary | ICD-10-CM

## 2019-03-13 DIAGNOSIS — I10 Essential (primary) hypertension: Secondary | ICD-10-CM | POA: Diagnosis not present

## 2019-03-13 DIAGNOSIS — E119 Type 2 diabetes mellitus without complications: Secondary | ICD-10-CM

## 2019-03-13 DIAGNOSIS — E785 Hyperlipidemia, unspecified: Secondary | ICD-10-CM

## 2019-03-13 LAB — POCT GLYCOSYLATED HEMOGLOBIN (HGB A1C): Hemoglobin A1C: 6 % — AB (ref 4.0–5.6)

## 2019-03-13 MED ORDER — LISINOPRIL 20 MG PO TABS: ORAL_TABLET | ORAL | 3 refills | Status: DC

## 2019-03-13 MED ORDER — BYSTOLIC 20 MG PO TABS: 1.0000 | ORAL_TABLET | Freq: Every day | ORAL | 2 refills | Status: DC

## 2019-03-13 MED ORDER — AMLODIPINE BESYLATE 10 MG PO TABS: ORAL_TABLET | ORAL | 3 refills | Status: DC

## 2019-03-13 NOTE — Progress Notes (Signed)
This visit occurred during the SARS-CoV-2 public health emergency.  Safety protocols were in place, including screening questions prior to the visit, additional usage of staff PPE, and extensive cleaning of exam room while observing appropriate contact time as indicated for disinfecting solutions.    Chief Complaint  Patient presents with  . Medication Refill    refill on blood pressure medications    HPI: Alexandra [name] 69 y.o. come in for Chronic disease management  Yearly visit   DM : has been controlled last  a1c in July good  BP  Doing ok  More mindful of eating .  On meds will need refill  ocass to low  etoh  Sleep about 6-7  Walking  3 x per week 30 minutm 30  No tobacco ocass etoh Still declines vaccines  Waiting to decide on the covid vaccine ROS: See pertinent positives and negatives per HPI. No cps pb  Walks daily and denies limitations is retired  Past Medical History:  Diagnosis Date  . Diabetes mellitus without complication ([facility])   . Hypertension    echo   . Iron deficiency anemia, unspecified 03/15/2013  . Thrombocytosis (HCC)     Family History  Problem Relation Age of Onset  . Heart disease Mother        died age 63  . Kidney disease Mother        family hx  . Heart attack Father 74       massive  . Hypertension Sister   . Hypertension Sister   . Hypertension Sister   . Hypertension Sister   . Arthritis Unknown        family hx  . Diabetes Unknown        family hx  . Stroke Unknown         dialysis     Outpatient Medications Prior to Visit  Medication Sig Dispense Refill  . anagrelide (AGRYLIN) 1 MG capsule Take 5 mg by mouth 2 (two) times daily. Take 5 capsules by moth twice a day.     Marland Kitchen aspirin 81 MG tablet Take 81 mg by mouth daily.    . Fe Fum-FePoly-Vit C-Vit B3 (INTEGRA) 62.5-62.5-40-3 MG CAPS TAKE 1 CAPSULE BY MOUTH DAILY 30 capsule 8  . folic acid (FOLVITE) 1 MG tablet TAKE 2 TABLETS BY MOUTH ONCE DAILY 60 tablet 11  . glucose blood  (ONE TOUCH ULTRA TEST) test strip Use to test blood sugar 1 times daily. **PT NEEDS FOLLOW UP APPT FOR FURTHER REFILLS** 100 each 0  . ONETOUCH DELICA LANCETS 99991111 MISC Use to test blood sugar 1 time daily. **PT NEEDS FOLLOW UP APPT FOR FURTHER REFILLS** 100 each 1  . Vitamin D, Ergocalciferol, (DRISDOL) 1.25 MG (50000 UT) CAPS capsule TAKE 1 CAPSULE BY MOUTH 1 TIME A WEEK 12 capsule 2  . amLODipine (NORVASC) 10 MG tablet TAKE 1 TABLET BY MOUTH ONCE DAILY **NEED OFFICE VISIT FOR FURTHER REFILLS** 30 tablet 0  . BYSTOLIC 20 MG TABS Take 1 tablet by mouth once daily 90 tablet 0  . lisinopril (ZESTRIL) 20 MG tablet TAKE 2 TABLETS BY MOUTH ONCE DAILY **NEED OFFICE VISIT FOR FURTHER REFILLS** 60 tablet 0   No facility-administered medications prior to visit.    EXAM:  BP 130/72 (BP Location: Right Arm, Patient Position: Sitting, Cuff Size: Normal)   Pulse 96   Temp (!) 97.5 F (36.4 C) (Temporal)   Ht 5\' 11"  (1.803 m)   Wt 133 lb 8 oz (  60.6 kg)   SpO2 97%   BMI 18.62 kg/m   Body mass index is 18.62 kg/m.  GENERAL: vitals reviewed and listed above, alert, oriented, appears well hydrated and in no acute distress HEENT: atraumatic, conjunctiva  clear, no obvious abnormalities on inspection of external nose and ears OP : masked  NECK: no obvious masses on inspection palpation  LUNGS: clear to auscultation bilaterally, no wheezes, rales or rhonchi, good air movement CV: HRRR,  2/6 sem upper chest  More in upright than supine  Brisk pulses but not bounding no clubbing cyanosis or  peripheral edema nl cap refill  Abdomen:  Sof,t normal bowel sounds without hepatosplenomegaly, no guarding rebound or masses no CVA tenderness MS: moves all extremities without noticeable focal  abnormality PSYCH: pleasant and cooperative, no obvious depression or anxiety  Diabetic Foot Exam - Simple   Simple Foot Form Diabetic Foot exam was performed with the following findings: Yes 03/13/2019  3:59 PM  Visual  Inspection No deformities, no ulcerations, no other skin breakdown bilaterally: Yes Sensation Testing Intact to touch and monofilament testing bilaterally: Yes Pulse Check Posterior Tibialis and Dorsalis pulse intact bilaterally: Yes Comments bunion     Lab Results  Component Value Date   WBC 7.1 01/25/2019   HGB 9.0 (L) 01/25/2019   HCT 28.0 (L) 01/25/2019   PLT 322 01/25/2019   GLUCOSE 120 (H) 01/25/2019   CHOL 229 (H) 10/18/2017   TRIG 74.0 10/18/2017   HDL 57.40 10/18/2017   LDLDIRECT 127.3 04/19/2012   LDLCALC 157 (H) 10/18/2017   ALT 13 01/25/2019   AST 15 01/25/2019   NA 141 01/25/2019   K 3.5 01/25/2019   CL 104 01/25/2019   CREATININE 1.11 (H) 01/25/2019   BUN 28 (H) 01/25/2019   CO2 29 01/25/2019   TSH 1.62 05/14/2016   INR 1.1 05/13/2018   HGBA1C 6.0 (A) 03/13/2019   MICROALBUR 1.8 05/14/2016   BP Readings from Last 3 Encounters:  03/13/19 130/72  01/25/19 137/79  11/28/18 127/70    ASSESSMENT AND PLAN:  Discussed the following assessment and plan:  Controlled type 2 diabetes mellitus without complication, unspecified whether long term insulin use (HCC) - controlled with  lsi no complications  - Plan: POC HgB A1c  Essential hypertension - refill medication - Plan: ECHOCARDIOGRAM COMPLETE  Medication management  Systolic murmur - could be functional from anemia  but  with underlying ht etc  will get echo and if all ok then follow  - Plan: ECHOCARDIOGRAM COMPLETE  Hyperlipidemia, unspecified hyperlipidemia type - lsi  declines   medicaiton  Statin declined  23-polyvalent pneumococcal polysaccharide vaccine declined  Influenza vaccination declined  -Patient advised to return or notify health care team  if  new concerns arise.  Patient Instructions  Glad you are doing well.   A 1c is 6.0   Good   You will be notified about the echocardiogram ( sound wave tests of heart)   to check heart anatomy and murmur to make sure all ok ...then will  follow  Your exam . And no other changes   Sometimes we hear a murmur when some one is anemic and has a normal heart .     Continue lifestyle intervention healthy eating and exercise .   Yearly check and  a1c can be done every 6 months     Lenox Ladouceur K. Raynette Arras M.D.

## 2019-03-13 NOTE — Patient Instructions (Addendum)
Glad you are doing well.   A 1c is 6.0   Good   You will be notified about the echocardiogram ( sound wave tests of heart)   to check heart anatomy and murmur to make sure all ok ...then will follow  Your exam . And no other changes   Sometimes we hear a murmur when some one is anemic and has a normal heart .     Continue lifestyle intervention healthy eating and exercise .   Yearly check and  a1c can be done every 6 months

## 2019-03-27 ENCOUNTER — Encounter: Payer: Self-pay | Admitting: *Deleted

## 2019-03-27 ENCOUNTER — Other Ambulatory Visit (HOSPITAL_COMMUNITY): Payer: Medicare PPO

## 2019-03-28 ENCOUNTER — Other Ambulatory Visit: Payer: Self-pay

## 2019-03-28 ENCOUNTER — Ambulatory Visit (HOSPITAL_COMMUNITY): Payer: Medicare PPO | Attending: Cardiovascular Disease

## 2019-03-28 DIAGNOSIS — I10 Essential (primary) hypertension: Secondary | ICD-10-CM

## 2019-03-28 DIAGNOSIS — R011 Cardiac murmur, unspecified: Secondary | ICD-10-CM | POA: Diagnosis present

## 2019-03-28 NOTE — Progress Notes (Signed)
Good news ..echocardiogram shows normal valves and   heart muscle is pumping normally .  I am surmising that the murmur I hear is from the anemia . No further work up needed

## 2019-04-06 ENCOUNTER — Other Ambulatory Visit: Payer: Self-pay | Admitting: Hematology & Oncology

## 2019-04-27 ENCOUNTER — Inpatient Hospital Stay (HOSPITAL_BASED_OUTPATIENT_CLINIC_OR_DEPARTMENT_OTHER): Payer: Medicare PPO | Admitting: Hematology & Oncology

## 2019-04-27 ENCOUNTER — Other Ambulatory Visit: Payer: Self-pay

## 2019-04-27 ENCOUNTER — Inpatient Hospital Stay: Payer: Medicare PPO | Attending: Hematology & Oncology

## 2019-04-27 VITALS — BP 142/82 | HR 94 | Temp 97.1°F | Resp 18 | Wt 132.0 lb

## 2019-04-27 DIAGNOSIS — D473 Essential (hemorrhagic) thrombocythemia: Secondary | ICD-10-CM

## 2019-04-27 DIAGNOSIS — Z7982 Long term (current) use of aspirin: Secondary | ICD-10-CM | POA: Diagnosis not present

## 2019-04-27 DIAGNOSIS — Z79899 Other long term (current) drug therapy: Secondary | ICD-10-CM | POA: Diagnosis not present

## 2019-04-27 DIAGNOSIS — E559 Vitamin D deficiency, unspecified: Secondary | ICD-10-CM

## 2019-04-27 LAB — CMP (CANCER CENTER ONLY)
ALT: 12 U/L (ref 0–44)
AST: 15 U/L (ref 15–41)
Albumin: 4.5 g/dL (ref 3.5–5.0)
Alkaline Phosphatase: 103 U/L (ref 38–126)
Anion gap: 7 (ref 5–15)
BUN: 35 mg/dL — ABNORMAL HIGH (ref 8–23)
CO2: 30 mmol/L (ref 22–32)
Calcium: 9.9 mg/dL (ref 8.9–10.3)
Chloride: 104 mmol/L (ref 98–111)
Creatinine: 1.29 mg/dL — ABNORMAL HIGH (ref 0.44–1.00)
GFR, Est AFR Am: 49 mL/min — ABNORMAL LOW (ref >60)
GFR, Estimated: 43 mL/min — ABNORMAL LOW (ref >60)
Glucose, Bld: 114 mg/dL — ABNORMAL HIGH (ref 70–99)
Potassium: 3.8 mmol/L (ref 3.5–5.1)
Sodium: 141 mmol/L (ref 135–145)
Total Bilirubin: 0.6 mg/dL (ref 0.3–1.2)
Total Protein: 8.1 g/dL (ref 6.5–8.1)

## 2019-04-27 LAB — VITAMIN D 25 HYDROXY (VIT D DEFICIENCY, FRACTURES): Vit D, 25-Hydroxy: 93.96 ng/mL (ref 30–100)

## 2019-04-27 LAB — CBC WITH DIFFERENTIAL (CANCER CENTER ONLY)
Abs Immature Granulocytes: 0.08 10*3/uL — ABNORMAL HIGH (ref 0.00–0.07)
Basophils Absolute: 0.1 10*3/uL (ref 0.0–0.1)
Basophils Relative: 1 %
Eosinophils Absolute: 0.3 10*3/uL (ref 0.0–0.5)
Eosinophils Relative: 4 %
HCT: 28.5 % — ABNORMAL LOW (ref 36.0–46.0)
Hemoglobin: 8.8 g/dL — ABNORMAL LOW (ref 12.0–15.0)
Immature Granulocytes: 1 %
Lymphocytes Relative: 21 %
Lymphs Abs: 1.8 10*3/uL (ref 0.7–4.0)
MCH: 25.2 pg — ABNORMAL LOW (ref 26.0–34.0)
MCHC: 30.9 g/dL (ref 30.0–36.0)
MCV: 81.7 fL (ref 80.0–100.0)
Monocytes Absolute: 0.8 10*3/uL (ref 0.1–1.0)
Monocytes Relative: 9 %
Neutro Abs: 5.5 10*3/uL (ref 1.7–7.7)
Neutrophils Relative %: 64 %
Platelet Count: 459 10*3/uL — ABNORMAL HIGH (ref 150–400)
RBC: 3.49 MIL/uL — ABNORMAL LOW (ref 3.87–5.11)
RDW: 17.8 % — ABNORMAL HIGH (ref 11.5–15.5)
WBC Count: 8.5 10*3/uL (ref 4.0–10.5)
nRBC: 0 % (ref 0.0–0.2)

## 2019-04-27 LAB — SAVE SMEAR(SSMR), FOR PROVIDER SLIDE REVIEW

## 2019-04-27 LAB — LACTATE DEHYDROGENASE: LDH: 239 U/L — ABNORMAL HIGH (ref 98–192)

## 2019-04-27 MED ORDER — ANAGRELIDE HCL 1 MG PO CAPS: 5.0000 mg | ORAL_CAPSULE | Freq: Every day | ORAL | 4 refills | Status: DC

## 2019-04-27 NOTE — Addendum Note (Signed)
Addended by: Burney Gauze R on: 04/27/2019 11:29 AM   Modules accepted: Orders

## 2019-04-27 NOTE — Progress Notes (Signed)
[name]  It is [employer].  Hematology and Oncology Follow Up Visit  DAZIAH SCHOENBORN EZ:[id number] 21-Aug-1950 69 y.o. 04/27/2019   Principle Diagnosis:   Anagrelide 5 mg po q day - started on 17/20/2020   Aspirin 81 mg by mouth daily  Folic acid 2 mg by mouth daily   Current Therapy:    Essential thrombocythemia-Calreticulin positive  Interim History:  Ms.  Perdomo is back for followup.  I hate to say that her sister passed away on New Year's Eve.  This was down in [place], [place].  I know that Ms. Stigen is still somewhat stressed over this.  I know her sister had some pulmonary issues.  Ms. Seabourn, otherwise is doing okay.  She had a echocardiogram done on 03/28/2019.  She had an ejection fraction of 60-65%.  Everything else looked okay.  She had normal valvular structure.  She has had no problems with bleeding.  There is no change in bowel or bladder habits.  She has had no cough.  She has had no rashes.  She has had no fever.  She has not yet had her coronavirus vaccine.  I told her that I did not see a reason that she could not have it done.  Overall, her performance status is ECOG 1.  Medications:  Current Outpatient Medications:  .  amLODipine (NORVASC) 10 MG tablet, TAKE 1 TABLET BY MOUTH ONCE DAILY, Disp: 90 tablet, Rfl: 3 .  anagrelide (AGRYLIN) 1 MG capsule, TAKE 4 CAPSULES BY MOUTH TWICE DAILY (Patient taking differently: Pt. States taking 5 capsules /day.), Disp: 150 capsule, Rfl: 0 .  aspirin 81 MG tablet, Take 81 mg by mouth daily., Disp: , Rfl:  .  Fe Fum-FePoly-Vit C-Vit B3 (INTEGRA) 62.5-62.5-40-3 MG CAPS, TAKE 1 CAPSULE BY MOUTH DAILY, Disp: 30 capsule, Rfl: 8 .  folic acid (FOLVITE) 1 MG tablet, TAKE 2 TABLETS BY MOUTH ONCE DAILY, Disp: 60 tablet, Rfl: 11 .  glucose blood (ONE TOUCH ULTRA TEST) test strip, Use to test blood sugar 1 times daily. **PT NEEDS FOLLOW UP APPT FOR FURTHER REFILLS**, Disp: 100 each, Rfl: 0 .  lisinopril (ZESTRIL) 20 MG  tablet, TAKE 2 TABLETS BY MOUTH ONCE DAILY, Disp: 180 tablet, Rfl: 3 .  Nebivolol HCl (BYSTOLIC) 20 MG TABS, Take 1 tablet (20 mg total) by mouth daily., Disp: 90 tablet, Rfl: 2 .  ONETOUCH DELICA LANCETS 99991111 MISC, Use to test blood sugar 1 time daily. **PT NEEDS FOLLOW UP APPT FOR FURTHER REFILLS**, Disp: 100 each, Rfl: 1 .  Vitamin D, Ergocalciferol, (DRISDOL) 1.25 MG (50000 UT) CAPS capsule, TAKE 1 CAPSULE BY MOUTH 1 TIME A WEEK, Disp: 12 capsule, Rfl: 2  Allergies: No Known Allergies  Past Medical History, Surgical history, Social history, and Family History were reviewed and updated.  Review of Systems: Review of Systems  All other systems reviewed and are negative. Marland Kitchen  Physical Exam:  weight is 132 lb (59.9 kg). Her temporal temperature is 97.1 F (36.2 C) (abnormal). Her blood pressure is 142/82 (abnormal) and her pulse is 94. Her respiration is 18 and oxygen saturation is 100%. =jh  Physical Exam Vitals reviewed.  HENT:     Head: Normocephalic and atraumatic.  Eyes:     Pupils: Pupils are equal, round, and reactive to light.  Cardiovascular:     Rate and Rhythm: Normal rate and regular rhythm.     Heart sounds: Normal heart sounds.  Pulmonary:     Effort: Pulmonary  effort is normal.     Breath sounds: Normal breath sounds.  Abdominal:     General: Bowel sounds are normal.     Palpations: Abdomen is soft.  Musculoskeletal:        General: No tenderness or deformity. Normal range of motion.     Cervical back: Normal range of motion.  Lymphadenopathy:     Cervical: No cervical adenopathy.  Skin:    General: Skin is warm and dry.     Findings: No erythema or rash.  Neurological:     Mental Status: She is alert and oriented to person, place, and time.  Psychiatric:        Behavior: Behavior normal.        Thought Content: Thought content normal.        Judgment: Judgment normal.      Lab Results  Component Value Date   WBC 8.5 04/27/2019   HGB 8.8 (L)  04/27/2019   HCT 28.5 (L) 04/27/2019   MCV 81.7 04/27/2019   PLT 459 (H) 04/27/2019     Chemistry      Component Value Date/Time   NA 141 04/27/2019 0937   NA 144 02/02/2017 1158   NA 140 07/24/2016 1007   K 3.8 04/27/2019 0937   K 3.7 02/02/2017 1158   K 3.6 07/24/2016 1007   CL 104 04/27/2019 0937   CL 103 02/02/2017 1158   CO2 30 04/27/2019 0937   CO2 29 02/02/2017 1158   CO2 26 07/24/2016 1007   BUN 35 (H) 04/27/2019 0937   BUN 20 02/02/2017 1158   BUN 30.3 (H) 07/24/2016 1007   CREATININE 1.29 (H) 04/27/2019 0937   CREATININE 0.9 02/02/2017 1158   CREATININE 1.1 07/24/2016 1007   GLU 129 02/02/2017 0000      Component Value Date/Time   CALCIUM 9.9 04/27/2019 0937   CALCIUM 9.0 02/02/2017 1158   CALCIUM 9.3 07/24/2016 1007   ALKPHOS 103 04/27/2019 0937   ALKPHOS 110 (H) 02/02/2017 1158   ALKPHOS 104 07/24/2016 1007   AST 15 04/27/2019 0937   AST 13 07/24/2016 1007   ALT 12 04/27/2019 0937   ALT 26 02/02/2017 1158   ALT 15 07/24/2016 1007   BILITOT 0.6 04/27/2019 0937   BILITOT 0.55 07/24/2016 1007      Impression and Plan: Ms. Schlehuber is 69 year old [name]. She has essential thrombocythemia. She actually is Calreticulin positive.  Her platelet count is up a little bit.  She does tend to fluctuate within a range.  I will not change the dose of anagrelide for her.  Again I feel bad that her sister passed away.  I know this is tough on her.  She will come back to see [place] in another couple months.    [name], MD 2/25/202110:36 AM

## 2019-05-02 ENCOUNTER — Telehealth: Payer: Self-pay | Admitting: Internal Medicine

## 2019-05-02 NOTE — Telephone Encounter (Signed)
Needs letter of documentation showing that she's a diabetic and high risk at her age for [place]. She hasn't received the Vaccinations yet. This letter is for the Department of [place]. Patient would like this letter as soon as possible  Please advise

## 2019-05-02 NOTE — Progress Notes (Signed)
   Virtual Visit via Telephone Note  I connected with@ on 05/03/19 at 11:00 AM EST by telephone and verified that I am speaking with the correct person using two identifiers.   I discussed the limitations, risks, security and privacy concerns of performing an evaluation and management service by telephone and the limited availability of in person appointments. tThere may be a patient responsible charge related to this service. The patient expressed understanding and agreed to proceed. Failed video visit  couldn't get her device to connect   Location patient: home Location provider: work  office Participants present for the call: patient, provider Patient did not have a visit in the prior 7 days to address this/these issue(s).   History of Present Illness: Alexandra [name]    recently lost her job from covid related effects   And now  Needs  Statement of risk  Related to employment  Opportunities and  complications of risk  She is going to get the vaccine but hasnt yet     Observations/Objective: Patient sounds personable and well on the phone. I do not appreciate any SOB. Speech and thought processing are grossly intact. Patient reported vitals:  covid risk by  ehr is 4  Assessment and Plan: Controlled type 2 diabetes mellitus without complication, without long-term current use of insulin ([place])  THROMBOCYTHEMIA  Medication management  Essential hypertension    Follow Up Instructions: Will compose letter for her to give to employment  Services  To document risk  And she will pick it up . Letter      [id number] [id number] [id number] [id number] 21-30 I did not refer this patient for an OV in the next 24 hours for this/these issue(s).  I discussed the assessment and treatment plan with the patient. The patient was provided an opportunity to ask questions and answered. The patient agreed with the plan and demonstrated an understanding of the instructions.   The patient was advised to call  back or seek an in-person evaluation if the symptoms worsen or if the condition fails to improve as anticipated.  I provided   15 minutes of non-face-to-face time during this encounter. No follow-ups on file.  Shanon Ace, MD

## 2019-05-02 NOTE — Telephone Encounter (Signed)
Pt has been made appt to discuss

## 2019-05-03 ENCOUNTER — Other Ambulatory Visit: Payer: Self-pay

## 2019-05-03 ENCOUNTER — Encounter: Payer: Self-pay | Admitting: Internal Medicine

## 2019-05-03 ENCOUNTER — Telehealth (INDEPENDENT_AMBULATORY_CARE_PROVIDER_SITE_OTHER): Payer: Medicare PPO | Admitting: Internal Medicine

## 2019-05-03 ENCOUNTER — Telehealth: Payer: Self-pay

## 2019-05-03 DIAGNOSIS — I10 Essential (primary) hypertension: Secondary | ICD-10-CM | POA: Diagnosis not present

## 2019-05-03 DIAGNOSIS — D473 Essential (hemorrhagic) thrombocythemia: Secondary | ICD-10-CM | POA: Diagnosis not present

## 2019-05-03 DIAGNOSIS — E119 Type 2 diabetes mellitus without complications: Secondary | ICD-10-CM

## 2019-05-03 DIAGNOSIS — Z79899 Other long term (current) drug therapy: Secondary | ICD-10-CM | POA: Diagnosis not present

## 2019-05-03 NOTE — Telephone Encounter (Signed)
lvm letting pt know letter is ready for pickup at front desk

## 2019-05-04 ENCOUNTER — Other Ambulatory Visit: Payer: Self-pay | Admitting: Hematology & Oncology

## 2019-05-08 ENCOUNTER — Other Ambulatory Visit: Payer: Self-pay | Admitting: Family

## 2019-05-08 DIAGNOSIS — D473 Essential (hemorrhagic) thrombocythemia: Secondary | ICD-10-CM

## 2019-05-08 DIAGNOSIS — D508 Other iron deficiency anemias: Secondary | ICD-10-CM

## 2019-06-09 ENCOUNTER — Encounter: Payer: Self-pay | Admitting: Hematology & Oncology

## 2019-06-09 ENCOUNTER — Inpatient Hospital Stay: Payer: Medicare PPO | Attending: Hematology & Oncology | Admitting: Hematology & Oncology

## 2019-06-09 ENCOUNTER — Inpatient Hospital Stay: Payer: Medicare PPO

## 2019-06-09 ENCOUNTER — Other Ambulatory Visit: Payer: Self-pay

## 2019-06-09 VITALS — BP 141/87 | HR 95 | Temp 97.5°F | Resp 17 | Wt 134.0 lb

## 2019-06-09 DIAGNOSIS — D649 Anemia, unspecified: Secondary | ICD-10-CM | POA: Diagnosis not present

## 2019-06-09 DIAGNOSIS — Z7982 Long term (current) use of aspirin: Secondary | ICD-10-CM | POA: Diagnosis not present

## 2019-06-09 DIAGNOSIS — Z79899 Other long term (current) drug therapy: Secondary | ICD-10-CM | POA: Diagnosis not present

## 2019-06-09 DIAGNOSIS — D473 Essential (hemorrhagic) thrombocythemia: Secondary | ICD-10-CM | POA: Insufficient documentation

## 2019-06-09 LAB — CBC WITH DIFFERENTIAL (CANCER CENTER ONLY)
Abs Immature Granulocytes: 0.06 10*3/uL (ref 0.00–0.07)
Basophils Absolute: 0.1 10*3/uL (ref 0.0–0.1)
Basophils Relative: 1 %
Eosinophils Absolute: 0.3 10*3/uL (ref 0.0–0.5)
Eosinophils Relative: 4 %
HCT: 27.2 % — ABNORMAL LOW (ref 36.0–46.0)
Hemoglobin: 8.4 g/dL — ABNORMAL LOW (ref 12.0–15.0)
Immature Granulocytes: 1 %
Lymphocytes Relative: 22 %
Lymphs Abs: 1.7 10*3/uL (ref 0.7–4.0)
MCH: 25.1 pg — ABNORMAL LOW (ref 26.0–34.0)
MCHC: 30.9 g/dL (ref 30.0–36.0)
MCV: 81.4 fL (ref 80.0–100.0)
Monocytes Absolute: 0.8 10*3/uL (ref 0.1–1.0)
Monocytes Relative: 10 %
Neutro Abs: 5 10*3/uL (ref 1.7–7.7)
Neutrophils Relative %: 62 %
Platelet Count: 352 10*3/uL (ref 150–400)
RBC: 3.34 MIL/uL — ABNORMAL LOW (ref 3.87–5.11)
RDW: 17.5 % — ABNORMAL HIGH (ref 11.5–15.5)
WBC Count: 8 10*3/uL (ref 4.0–10.5)
nRBC: 0 % (ref 0.0–0.2)

## 2019-06-09 LAB — CMP (CANCER CENTER ONLY)
ALT: 9 U/L (ref 0–44)
AST: 14 U/L — ABNORMAL LOW (ref 15–41)
Albumin: 4.2 g/dL (ref 3.5–5.0)
Alkaline Phosphatase: 89 U/L (ref 38–126)
Anion gap: 7 (ref 5–15)
BUN: 33 mg/dL — ABNORMAL HIGH (ref 8–23)
CO2: 29 mmol/L (ref 22–32)
Calcium: 9.4 mg/dL (ref 8.9–10.3)
Chloride: 105 mmol/L (ref 98–111)
Creatinine: 1.12 mg/dL — ABNORMAL HIGH (ref 0.44–1.00)
GFR, Est AFR Am: 58 mL/min — ABNORMAL LOW (ref >60)
GFR, Estimated: 50 mL/min — ABNORMAL LOW (ref >60)
Glucose, Bld: 101 mg/dL — ABNORMAL HIGH (ref 70–99)
Potassium: 3.6 mmol/L (ref 3.5–5.1)
Sodium: 141 mmol/L (ref 135–145)
Total Bilirubin: 0.5 mg/dL (ref 0.3–1.2)
Total Protein: 7.1 g/dL (ref 6.5–8.1)

## 2019-06-09 LAB — IRON AND TIBC
Iron: 75 ug/dL (ref 41–142)
Saturation Ratios: 36 % (ref 21–57)
TIBC: 208 ug/dL — ABNORMAL LOW (ref 236–444)
UIBC: 132 ug/dL (ref 120–384)

## 2019-06-09 LAB — FERRITIN: Ferritin: 284 ng/mL (ref 11–307)

## 2019-06-09 LAB — LACTATE DEHYDROGENASE: LDH: 224 U/L — ABNORMAL HIGH (ref 98–192)

## 2019-06-09 LAB — SAVE SMEAR(SSMR), FOR PROVIDER SLIDE REVIEW

## 2019-06-09 NOTE — Progress Notes (Signed)
Plan Alexandra [name]  It is a [employer].  Hematology and Oncology Follow Up Visit  Alexandra [name] [id number] 1950-08-13 69 y.o. 06/09/2019   Principle Diagnosis:   Anagrelide 5 mg po q day - started on 17/20/2020   Aspirin 81 mg by mouth daily  Folic acid 2 mg by mouth daily   Current Therapy:    Essential thrombocythemia-Calreticulin positive  Interim History:  [name] is back for followup.  So far, she is doing pretty well.  Her platelet count is at 352,000.  This really is a nice drop for her.  The anagrelide seems to be working quite well.  The other problem that we have with her is that she is a little bit more anemic.  She really is not symptomatic with it however.  I told her that we could certainly use Aranesp as her erythropoietin level is only 23.  She has had no problems with bleeding.  She has had no nausea or vomiting.  There is been no change in bowel or bladder habits.  She has had no leg swelling.  She has had no rashes.  There is been no headache.  She is eating pretty well.  She is not a vegetarian.  Overall, her performance status is ECOG 1.  Medications:  Current Outpatient Medications:  .  amLODipine (NORVASC) 10 MG tablet, TAKE 1 TABLET BY MOUTH ONCE DAILY, Disp: 90 tablet, Rfl: 3 .  anagrelide (AGRYLIN) 1 MG capsule, TAKE 4 CAPSULES BY MOUTH TWICE DAILY (Patient taking differently: 5 mg. ), Disp: 150 capsule, Rfl: 4 .  aspirin 81 MG tablet, Take 81 mg by mouth daily., Disp: , Rfl:  .  Fe Fum-FePoly-Vit C-Vit B3 (INTEGRA) 62.5-62.5-40-3 MG CAPS, TAKE 1 CAPSULE BY MOUTH DAILY, Disp: 30 capsule, Rfl: 8 .  folic acid (FOLVITE) 1 MG tablet, Take 2 tablets by mouth once daily, Disp: 60 tablet, Rfl: 0 .  glucose blood (ONE TOUCH ULTRA TEST) test strip, Use to test blood sugar 1 times daily. **PT NEEDS FOLLOW UP APPT FOR FURTHER REFILLS**, Disp: 100 each, Rfl: 0 .  lisinopril (ZESTRIL) 20 MG tablet, TAKE 2 TABLETS BY MOUTH ONCE DAILY, Disp: 180 tablet, Rfl: 3 .   Nebivolol HCl (BYSTOLIC) 20 MG TABS, Take 1 tablet (20 mg total) by mouth daily., Disp: 90 tablet, Rfl: 2 .  ONETOUCH DELICA LANCETS 99991111 MISC, Use to test blood sugar 1 time daily. **PT NEEDS FOLLOW UP APPT FOR FURTHER REFILLS**, Disp: 100 each, Rfl: 1 .  Vitamin D, Ergocalciferol, (DRISDOL) 1.25 MG (50000 UT) CAPS capsule, TAKE 1 CAPSULE BY MOUTH 1 TIME A WEEK, Disp: 12 capsule, Rfl: 2  Allergies: No Known Allergies  Past Medical History, Surgical history, Social history, and Family History were reviewed and updated.  Review of Systems: Review of Systems  All other systems reviewed and are negative. Marland Kitchen  Physical Exam:  weight is 134 lb (60.8 kg). Her temporal temperature is 97.5 F (36.4 C) (abnormal). Her blood pressure is 141/87 (abnormal) and her pulse is 95. Her respiration is 17 and oxygen saturation is 100%. =jh  Physical Exam Vitals reviewed.  HENT:     Head: Normocephalic and atraumatic.  Eyes:     Pupils: Pupils are equal, round, and reactive to light.  Cardiovascular:     Rate and Rhythm: Normal rate and regular rhythm.     Heart sounds: Normal heart sounds.  Pulmonary:     Effort: Pulmonary effort is normal.     Breath  sounds: Normal breath sounds.  Abdominal:     General: Bowel sounds are normal.     Palpations: Abdomen is soft.  Musculoskeletal:        General: No tenderness or deformity. Normal range of motion.     Cervical back: Normal range of motion.  Lymphadenopathy:     Cervical: No cervical adenopathy.  Skin:    General: Skin is warm and dry.     Findings: No erythema or rash.  Neurological:     Mental Status: She is alert and oriented to person, place, and time.  Psychiatric:        Behavior: Behavior normal.        Thought Content: Thought content normal.        Judgment: Judgment normal.      Lab Results  Component Value Date   WBC 8.0 06/09/2019   HGB 8.4 (L) 06/09/2019   HCT 27.2 (L) 06/09/2019   MCV 81.4 06/09/2019   PLT 352 06/09/2019      Chemistry      Component Value Date/Time   NA 141 06/09/2019 0943   NA 144 02/02/2017 1158   NA 140 07/24/2016 1007   K 3.6 06/09/2019 0943   K 3.7 02/02/2017 1158   K 3.6 07/24/2016 1007   CL 105 06/09/2019 0943   CL 103 02/02/2017 1158   CO2 29 06/09/2019 0943   CO2 29 02/02/2017 1158   CO2 26 07/24/2016 1007   BUN 33 (H) 06/09/2019 0943   BUN 20 02/02/2017 1158   BUN 30.3 (H) 07/24/2016 1007   CREATININE 1.12 (H) 06/09/2019 0943   CREATININE 0.9 02/02/2017 1158   CREATININE 1.1 07/24/2016 1007   GLU 129 02/02/2017 0000      Component Value Date/Time   CALCIUM 9.4 06/09/2019 0943   CALCIUM 9.0 02/02/2017 1158   CALCIUM 9.3 07/24/2016 1007   ALKPHOS 89 06/09/2019 0943   ALKPHOS 110 (H) 02/02/2017 1158   ALKPHOS 104 07/24/2016 1007   AST 14 (L) 06/09/2019 0943   AST 13 07/24/2016 1007   ALT 9 06/09/2019 0943   ALT 26 02/02/2017 1158   ALT 15 07/24/2016 1007   BILITOT 0.5 06/09/2019 0943   BILITOT 0.55 07/24/2016 1007      Impression and Plan: Alexandra [name] is 69 year old [name]. She has essential thrombocythemia. She actually is Calreticulin positive.  Thankfully, her platelet count is doing quite well.  This is certainly quite encouraging.  I just wish that her hemoglobin was a little bit higher.  For right now, we will have her come back in 5 weeks.  We may address the ESA issue at that time.  Marland Kitchen [name], MD 4/9/202110:52 AM

## 2019-06-12 ENCOUNTER — Other Ambulatory Visit: Payer: Self-pay | Admitting: Family

## 2019-06-12 DIAGNOSIS — D508 Other iron deficiency anemias: Secondary | ICD-10-CM

## 2019-06-12 DIAGNOSIS — D473 Essential (hemorrhagic) thrombocythemia: Secondary | ICD-10-CM

## 2019-07-17 ENCOUNTER — Other Ambulatory Visit: Payer: Self-pay | Admitting: Family

## 2019-07-17 DIAGNOSIS — D473 Essential (hemorrhagic) thrombocythemia: Secondary | ICD-10-CM

## 2019-07-17 DIAGNOSIS — D508 Other iron deficiency anemias: Secondary | ICD-10-CM

## 2019-07-18 ENCOUNTER — Inpatient Hospital Stay (HOSPITAL_BASED_OUTPATIENT_CLINIC_OR_DEPARTMENT_OTHER): Payer: Medicare PPO | Admitting: Hematology & Oncology

## 2019-07-18 ENCOUNTER — Other Ambulatory Visit: Payer: Self-pay

## 2019-07-18 ENCOUNTER — Telehealth: Payer: Self-pay | Admitting: Hematology & Oncology

## 2019-07-18 ENCOUNTER — Encounter: Payer: Self-pay | Admitting: Hematology & Oncology

## 2019-07-18 ENCOUNTER — Inpatient Hospital Stay: Payer: Medicare PPO | Attending: Hematology & Oncology

## 2019-07-18 VITALS — BP 119/60 | HR 97 | Temp 97.5°F | Resp 20 | Wt 132.1 lb

## 2019-07-18 DIAGNOSIS — Z7982 Long term (current) use of aspirin: Secondary | ICD-10-CM | POA: Diagnosis not present

## 2019-07-18 DIAGNOSIS — D473 Essential (hemorrhagic) thrombocythemia: Secondary | ICD-10-CM | POA: Insufficient documentation

## 2019-07-18 DIAGNOSIS — D508 Other iron deficiency anemias: Secondary | ICD-10-CM | POA: Diagnosis not present

## 2019-07-18 DIAGNOSIS — Z79899 Other long term (current) drug therapy: Secondary | ICD-10-CM | POA: Insufficient documentation

## 2019-07-18 LAB — RETICULOCYTES
Immature Retic Fract: 17.6 % — ABNORMAL HIGH (ref 2.3–15.9)
RBC.: 3.43 MIL/uL — ABNORMAL LOW (ref 3.87–5.11)
Retic Count, Absolute: 41.2 10*3/uL (ref 19.0–186.0)
Retic Ct Pct: 1.2 % (ref 0.4–3.1)

## 2019-07-18 LAB — CBC WITH DIFFERENTIAL (CANCER CENTER ONLY)
Abs Immature Granulocytes: 0.04 10*3/uL (ref 0.00–0.07)
Basophils Absolute: 0.1 10*3/uL (ref 0.0–0.1)
Basophils Relative: 1 %
Eosinophils Absolute: 0.3 10*3/uL (ref 0.0–0.5)
Eosinophils Relative: 3 %
HCT: 27.8 % — ABNORMAL LOW (ref 36.0–46.0)
Hemoglobin: 8.5 g/dL — ABNORMAL LOW (ref 12.0–15.0)
Immature Granulocytes: 1 %
Lymphocytes Relative: 24 %
Lymphs Abs: 2.2 10*3/uL (ref 0.7–4.0)
MCH: 24.8 pg — ABNORMAL LOW (ref 26.0–34.0)
MCHC: 30.6 g/dL (ref 30.0–36.0)
MCV: 81 fL (ref 80.0–100.0)
Monocytes Absolute: 1 10*3/uL (ref 0.1–1.0)
Monocytes Relative: 11 %
Neutro Abs: 5.3 10*3/uL (ref 1.7–7.7)
Neutrophils Relative %: 60 %
Platelet Count: 457 10*3/uL — ABNORMAL HIGH (ref 150–400)
RBC: 3.43 MIL/uL — ABNORMAL LOW (ref 3.87–5.11)
RDW: 18.4 % — ABNORMAL HIGH (ref 11.5–15.5)
WBC Count: 8.8 10*3/uL (ref 4.0–10.5)
nRBC: 0 % (ref 0.0–0.2)

## 2019-07-18 LAB — CMP (CANCER CENTER ONLY)
ALT: 13 U/L (ref 0–44)
AST: 13 U/L — ABNORMAL LOW (ref 15–41)
Albumin: 4.1 g/dL (ref 3.5–5.0)
Alkaline Phosphatase: 89 U/L (ref 38–126)
Anion gap: 8 (ref 5–15)
BUN: 32 mg/dL — ABNORMAL HIGH (ref 8–23)
CO2: 29 mmol/L (ref 22–32)
Calcium: 9.6 mg/dL (ref 8.9–10.3)
Chloride: 104 mmol/L (ref 98–111)
Creatinine: 1.18 mg/dL — ABNORMAL HIGH (ref 0.44–1.00)
GFR, Est AFR Am: 54 mL/min — ABNORMAL LOW (ref >60)
GFR, Estimated: 47 mL/min — ABNORMAL LOW (ref >60)
Glucose, Bld: 163 mg/dL — ABNORMAL HIGH (ref 70–99)
Potassium: 3.7 mmol/L (ref 3.5–5.1)
Sodium: 141 mmol/L (ref 135–145)
Total Bilirubin: 0.6 mg/dL (ref 0.3–1.2)
Total Protein: 6.8 g/dL (ref 6.5–8.1)

## 2019-07-18 LAB — SAVE SMEAR(SSMR), FOR PROVIDER SLIDE REVIEW

## 2019-07-18 LAB — LACTATE DEHYDROGENASE: LDH: 204 U/L — ABNORMAL HIGH (ref 98–192)

## 2019-07-18 NOTE — Telephone Encounter (Signed)
Appointments scheduled calendar printed per 5/18 los 

## 2019-07-18 NOTE — Progress Notes (Signed)
[name]  It is a [employer].  Hematology and Oncology Follow Up Visit  DONNALYN TIESZEN [id number] 02/03/1951 69 y.o. 07/18/2019   Principle Diagnosis:   Anagrelide 5 mg po q day - started on 17/20/2020   Aspirin 81 mg by mouth daily  Folic acid 2 mg by mouth daily   Current Therapy:    Essential thrombocythemia-Calreticulin positive  Interim History:  Ms.  Alexandra [name] is back for followup.  Everything seems to be going pretty well for her.  She has had no problems with the anagrelide.  She has been pretty active.  She tries to walk and stay active.  She has had no issues with her appetite.  She has had no nausea or vomiting.  She has had no change in bowel or bladder habits.  There is been no fever.  She got her second coronavirus vaccine in the middle of April.  Overall, her performance status is ECOG 1.  Medications:  Current Outpatient Medications:  .  amLODipine (NORVASC) 10 MG tablet, TAKE 1 TABLET BY MOUTH ONCE DAILY, Disp: 90 tablet, Rfl: 3 .  anagrelide (AGRYLIN) 1 MG capsule, TAKE 4 CAPSULES BY MOUTH TWICE DAILY (Patient taking differently: 5 mg. ), Disp: 150 capsule, Rfl: 4 .  aspirin 81 MG tablet, Take 81 mg by mouth daily., Disp: , Rfl:  .  Fe Fum-FePoly-Vit C-Vit B3 (INTEGRA) 62.5-62.5-40-3 MG CAPS, TAKE 1 CAPSULE BY MOUTH DAILY, Disp: 30 capsule, Rfl: 8 .  folic acid (FOLVITE) 1 MG tablet, Take 2 tablets by mouth once daily, Disp: 60 tablet, Rfl: 0 .  glucose blood (ONE TOUCH ULTRA TEST) test strip, Use to test blood sugar 1 times daily. **PT NEEDS FOLLOW UP APPT FOR FURTHER REFILLS**, Disp: 100 each, Rfl: 0 .  lisinopril (ZESTRIL) 20 MG tablet, TAKE 2 TABLETS BY MOUTH ONCE DAILY, Disp: 180 tablet, Rfl: 3 .  Nebivolol HCl (BYSTOLIC) 20 MG TABS, Take 1 tablet (20 mg total) by mouth daily., Disp: 90 tablet, Rfl: 2 .  ONETOUCH DELICA LANCETS 99991111 MISC, Use to test blood sugar 1 time daily. **PT NEEDS FOLLOW UP APPT FOR FURTHER REFILLS**, Disp: 100 each, Rfl: 1 .   Vitamin D, Ergocalciferol, (DRISDOL) 1.25 MG (50000 UT) CAPS capsule, TAKE 1 CAPSULE BY MOUTH 1 TIME A WEEK, Disp: 12 capsule, Rfl: 2  Allergies: No Known Allergies  Past Medical History, Surgical history, Social history, and Family History were reviewed and updated.  Review of Systems: Review of Systems  All other systems reviewed and are negative. Marland Kitchen  Physical Exam:  weight is 132 lb 1.3 oz (59.9 kg). Her temporal temperature is 97.5 F (36.4 C) (abnormal). Her blood pressure is 119/60 and her pulse is 97. Her respiration is 20 and oxygen saturation is 98%. =jh  Physical Exam Vitals reviewed.  HENT:     Head: Normocephalic and atraumatic.  Eyes:     Pupils: Pupils are equal, round, and reactive to light.  Cardiovascular:     Rate and Rhythm: Normal rate and regular rhythm.     Heart sounds: Normal heart sounds.  Pulmonary:     Effort: Pulmonary effort is normal.     Breath sounds: Normal breath sounds.  Abdominal:     General: Bowel sounds are normal.     Palpations: Abdomen is soft.  Musculoskeletal:        General: No tenderness or deformity. Normal range of motion.     Cervical back: Normal range of motion.  Lymphadenopathy:  Cervical: No cervical adenopathy.  Skin:    General: Skin is warm and dry.     Findings: No erythema or rash.  Neurological:     Mental Status: She is alert and oriented to person, place, and time.  Psychiatric:        Behavior: Behavior normal.        Thought Content: Thought content normal.        Judgment: Judgment normal.      Lab Results  Component Value Date   WBC 8.8 07/18/2019   HGB 8.5 (L) 07/18/2019   HCT 27.8 (L) 07/18/2019   MCV 81.0 07/18/2019   PLT 457 (H) 07/18/2019     Chemistry      Component Value Date/Time   NA 141 07/18/2019 1209   NA 144 02/02/2017 1158   NA 140 07/24/2016 1007   K 3.7 07/18/2019 1209   K 3.7 02/02/2017 1158   K 3.6 07/24/2016 1007   CL 104 07/18/2019 1209   CL 103 02/02/2017 1158    CO2 29 07/18/2019 1209   CO2 29 02/02/2017 1158   CO2 26 07/24/2016 1007   BUN 32 (H) 07/18/2019 1209   BUN 20 02/02/2017 1158   BUN 30.3 (H) 07/24/2016 1007   CREATININE 1.18 (H) 07/18/2019 1209   CREATININE 0.9 02/02/2017 1158   CREATININE 1.1 07/24/2016 1007   GLU 129 02/02/2017 0000      Component Value Date/Time   CALCIUM 9.6 07/18/2019 1209   CALCIUM 9.0 02/02/2017 1158   CALCIUM 9.3 07/24/2016 1007   ALKPHOS 89 07/18/2019 1209   ALKPHOS 110 (H) 02/02/2017 1158   ALKPHOS 104 07/24/2016 1007   AST 13 (L) 07/18/2019 1209   AST 13 07/24/2016 1007   ALT 13 07/18/2019 1209   ALT 26 02/02/2017 1158   ALT 15 07/24/2016 1007   BILITOT 0.6 07/18/2019 1209   BILITOT 0.55 07/24/2016 1007      Impression and Plan: Ms. Gerardo is 69 year old [name]. She has essential thrombocythemia. She actually is Calreticulin positive.  Overall, her platelet count does tend to fluctuate.  At least, it is staying within a range so that we do not need to make any changes in the anagrelide.  At least, her anemia is not worse.  She really is asymptomatic with this.  We will continue to see her back in 5-6 weeks.     [name], MD 5/18/202112:45 PM

## 2019-07-19 LAB — IRON AND TIBC
Iron: 60 ug/dL (ref 41–142)
Saturation Ratios: 28 % (ref 21–57)
TIBC: 210 ug/dL — ABNORMAL LOW (ref 236–444)
UIBC: 151 ug/dL (ref 120–384)

## 2019-07-19 LAB — FERRITIN: Ferritin: 287 ng/mL (ref 11–307)

## 2019-07-25 ENCOUNTER — Other Ambulatory Visit: Payer: Self-pay | Admitting: Hematology & Oncology

## 2019-07-25 DIAGNOSIS — D473 Essential (hemorrhagic) thrombocythemia: Secondary | ICD-10-CM

## 2019-07-25 DIAGNOSIS — D508 Other iron deficiency anemias: Secondary | ICD-10-CM

## 2019-08-18 ENCOUNTER — Inpatient Hospital Stay (HOSPITAL_BASED_OUTPATIENT_CLINIC_OR_DEPARTMENT_OTHER): Payer: Medicare PPO | Admitting: Hematology & Oncology

## 2019-08-18 ENCOUNTER — Inpatient Hospital Stay: Payer: Medicare PPO | Attending: Hematology & Oncology

## 2019-08-18 ENCOUNTER — Encounter: Payer: Self-pay | Admitting: Hematology & Oncology

## 2019-08-18 ENCOUNTER — Other Ambulatory Visit: Payer: Self-pay

## 2019-08-18 VITALS — BP 121/65 | HR 108 | Temp 97.1°F | Resp 16 | Wt 131.0 lb

## 2019-08-18 DIAGNOSIS — D508 Other iron deficiency anemias: Secondary | ICD-10-CM

## 2019-08-18 DIAGNOSIS — Z79899 Other long term (current) drug therapy: Secondary | ICD-10-CM | POA: Diagnosis not present

## 2019-08-18 DIAGNOSIS — D473 Essential (hemorrhagic) thrombocythemia: Secondary | ICD-10-CM

## 2019-08-18 DIAGNOSIS — D649 Anemia, unspecified: Secondary | ICD-10-CM | POA: Insufficient documentation

## 2019-08-18 DIAGNOSIS — Z7982 Long term (current) use of aspirin: Secondary | ICD-10-CM | POA: Insufficient documentation

## 2019-08-18 LAB — CBC WITH DIFFERENTIAL (CANCER CENTER ONLY)
Abs Immature Granulocytes: 0.14 10*3/uL — ABNORMAL HIGH (ref 0.00–0.07)
Basophils Absolute: 0.1 10*3/uL (ref 0.0–0.1)
Basophils Relative: 1 %
Eosinophils Absolute: 0.3 10*3/uL (ref 0.0–0.5)
Eosinophils Relative: 3 %
HCT: 26.7 % — ABNORMAL LOW (ref 36.0–46.0)
Hemoglobin: 8.1 g/dL — ABNORMAL LOW (ref 12.0–15.0)
Immature Granulocytes: 2 %
Lymphocytes Relative: 25 %
Lymphs Abs: 2 10*3/uL (ref 0.7–4.0)
MCH: 24.8 pg — ABNORMAL LOW (ref 26.0–34.0)
MCHC: 30.3 g/dL (ref 30.0–36.0)
MCV: 81.7 fL (ref 80.0–100.0)
Monocytes Absolute: 0.7 10*3/uL (ref 0.1–1.0)
Monocytes Relative: 9 %
Neutro Abs: 4.9 10*3/uL (ref 1.7–7.7)
Neutrophils Relative %: 60 %
Platelet Count: 333 10*3/uL (ref 150–400)
RBC: 3.27 MIL/uL — ABNORMAL LOW (ref 3.87–5.11)
RDW: 18.4 % — ABNORMAL HIGH (ref 11.5–15.5)
WBC Count: 8.1 10*3/uL (ref 4.0–10.5)
nRBC: 0 % (ref 0.0–0.2)

## 2019-08-18 LAB — CMP (CANCER CENTER ONLY)
ALT: 12 U/L (ref 0–44)
AST: 14 U/L — ABNORMAL LOW (ref 15–41)
Albumin: 4.2 g/dL (ref 3.5–5.0)
Alkaline Phosphatase: 83 U/L (ref 38–126)
Anion gap: 0 — ABNORMAL LOW (ref 5–15)
BUN: 29 mg/dL — ABNORMAL HIGH (ref 8–23)
CO2: 28 mmol/L (ref 22–32)
Calcium: 9.5 mg/dL (ref 8.9–10.3)
Chloride: 108 mmol/L (ref 98–111)
Creatinine: 1.27 mg/dL — ABNORMAL HIGH (ref 0.44–1.00)
GFR, Est AFR Am: 50 mL/min — ABNORMAL LOW (ref >60)
GFR, Estimated: 43 mL/min — ABNORMAL LOW (ref >60)
Glucose, Bld: 172 mg/dL — ABNORMAL HIGH (ref 70–99)
Potassium: 3.8 mmol/L (ref 3.5–5.1)
Sodium: 136 mmol/L (ref 135–145)
Total Bilirubin: 0.6 mg/dL (ref 0.3–1.2)
Total Protein: 7.6 g/dL (ref 6.5–8.1)

## 2019-08-18 LAB — SAVE SMEAR(SSMR), FOR PROVIDER SLIDE REVIEW

## 2019-08-18 LAB — RETICULOCYTES
Immature Retic Fract: 17.5 % — ABNORMAL HIGH (ref 2.3–15.9)
RBC.: 3.25 MIL/uL — ABNORMAL LOW (ref 3.87–5.11)
Retic Count, Absolute: 29.3 10*3/uL (ref 19.0–186.0)
Retic Ct Pct: 0.9 % (ref 0.4–3.1)

## 2019-08-18 NOTE — Progress Notes (Signed)
[name]  It is [employer].  Hematology and Oncology Follow Up Visit  Alexandra [name] [id number] 15-Sep-1950 69 y.o. 08/18/2019   Principle Diagnosis:   Anagrelide 5 mg po q day - started on 17/20/2020   Aspirin 81 mg by mouth daily  Folic acid 2 mg by mouth daily   Current Therapy:    Essential thrombocythemia-Calreticulin positive  Interim History:  [name] is back for followup.  Everything seems to be going pretty well for her.  She has had no problems with the anagrelide.  She has been pretty active.  She tries to walk and stay active.  She has had no issues with her appetite.  She has had no nausea or vomiting.  She has had no change in bowel or bladder habits.  There is been no fever.  She got her second coronavirus vaccine in the middle of April.  Overall, her performance status is ECOG 1.  Medications:  Current Outpatient Medications:  .  amLODipine (NORVASC) 10 MG tablet, TAKE 1 TABLET BY MOUTH ONCE DAILY, Disp: 90 tablet, Rfl: 3 .  anagrelide (AGRYLIN) 1 MG capsule, TAKE 4 CAPSULES BY MOUTH TWICE DAILY (Patient taking differently: 5 mg. ), Disp: 150 capsule, Rfl: 4 .  aspirin 81 MG tablet, Take 81 mg by mouth daily., Disp: , Rfl:  .  Fe Fum-FePoly-Vit C-Vit B3 (INTEGRA) 62.5-62.5-40-3 MG CAPS, TAKE 1 CAPSULE BY MOUTH DAILY, Disp: 30 capsule, Rfl: 8 .  folic acid (FOLVITE) 1 MG tablet, Take 2 tablets by mouth once daily, Disp: 60 tablet, Rfl: 0 .  glucose blood (ONE TOUCH ULTRA TEST) test strip, Use to test blood sugar 1 times daily. **PT NEEDS FOLLOW UP APPT FOR FURTHER REFILLS**, Disp: 100 each, Rfl: 0 .  lisinopril (ZESTRIL) 20 MG tablet, TAKE 2 TABLETS BY MOUTH ONCE DAILY, Disp: 180 tablet, Rfl: 3 .  Nebivolol HCl (BYSTOLIC) 20 MG TABS, Take 1 tablet (20 mg total) by mouth daily., Disp: 90 tablet, Rfl: 2 .  ONETOUCH DELICA LANCETS 27O MISC, Use to test blood sugar 1 time daily. **PT NEEDS FOLLOW UP APPT FOR FURTHER REFILLS**, Disp: 100 each, Rfl: 1 .   Vitamin D, Ergocalciferol, (DRISDOL) 1.25 MG (50000 UT) CAPS capsule, TAKE 1 CAPSULE BY MOUTH 1 TIME A WEEK, Disp: 12 capsule, Rfl: 2  Allergies: No Known Allergies  Past Medical History, Surgical history, Social history, and Family History were reviewed and updated.  Review of Systems: Review of Systems  All other systems reviewed and are negative. Marland Kitchen  Physical Exam:  weight is 131 lb (59.4 kg). Her temporal temperature is 97.1 F (36.2 C) (abnormal). Her blood pressure is 121/65 and her pulse is 108 (abnormal). Her respiration is 16 and oxygen saturation is 100%. =jh  Physical Exam Vitals reviewed.  HENT:     Head: Normocephalic and atraumatic.  Eyes:     Pupils: Pupils are equal, round, and reactive to light.  Cardiovascular:     Rate and Rhythm: Normal rate and regular rhythm.     Heart sounds: Normal heart sounds.  Pulmonary:     Effort: Pulmonary effort is normal.     Breath sounds: Normal breath sounds.  Abdominal:     General: Bowel sounds are normal.     Palpations: Abdomen is soft.  Musculoskeletal:        General: No tenderness or deformity. Normal range of motion.     Cervical back: Normal range of motion.  Lymphadenopathy:  Cervical: No cervical adenopathy.  Skin:    General: Skin is warm and dry.     Findings: No erythema or rash.  Neurological:     Mental Status: She is alert and oriented to person, place, and time.  Psychiatric:        Behavior: Behavior normal.        Thought Content: Thought content normal.        Judgment: Judgment normal.      Lab Results  Component Value Date   WBC 8.1 08/18/2019   HGB 8.1 (L) 08/18/2019   HCT 26.7 (L) 08/18/2019   MCV 81.7 08/18/2019   PLT 333 08/18/2019     Chemistry      Component Value Date/Time   NA 136 08/18/2019 1417   NA 144 02/02/2017 1158   NA 140 07/24/2016 1007   K 3.8 08/18/2019 1417   K 3.7 02/02/2017 1158   K 3.6 07/24/2016 1007   CL 108 08/18/2019 1417   CL 103 02/02/2017 1158    CO2 28 08/18/2019 1417   CO2 29 02/02/2017 1158   CO2 26 07/24/2016 1007   BUN 29 (H) 08/18/2019 1417   BUN 20 02/02/2017 1158   BUN 30.3 (H) 07/24/2016 1007   CREATININE 1.27 (H) 08/18/2019 1417   CREATININE 0.9 02/02/2017 1158   CREATININE 1.1 07/24/2016 1007   GLU 129 02/02/2017 0000      Component Value Date/Time   CALCIUM 9.5 08/18/2019 1417   CALCIUM 9.0 02/02/2017 1158   CALCIUM 9.3 07/24/2016 1007   ALKPHOS 83 08/18/2019 1417   ALKPHOS 110 (H) 02/02/2017 1158   ALKPHOS 104 07/24/2016 1007   AST 14 (L) 08/18/2019 1417   AST 13 07/24/2016 1007   ALT 12 08/18/2019 1417   ALT 26 02/02/2017 1158   ALT 15 07/24/2016 1007   BILITOT 0.6 08/18/2019 1417   BILITOT 0.55 07/24/2016 1007      Impression and Plan: Alexandra [name] is 69 year old [name]. She has essential thrombocythemia. She actually is Calreticulin positive.  I am very encouraged by the fact that her platelet count is still holding stable.  I noted however that her red cell count is down a little bit.  Hemoglobin was 8.1.  She is not symptomatic with this.  I know that she has always had an element of anemia but she has not wanted to be transfused.  I can understand that also.  She is so active.  She wants to maintain her quality of life which she is doing very nicely.  I think we can now get her back in 2 months.  Again everything is holding pretty stable.  She always knows she can come back sooner if she has any problems.     [name], MD 6/18/20213:17 PM

## 2019-08-21 LAB — IRON AND TIBC
Iron: 77 ug/dL (ref 41–142)
Saturation Ratios: 34 % (ref 21–57)
TIBC: 228 ug/dL — ABNORMAL LOW (ref 236–444)
UIBC: 150 ug/dL (ref 120–384)

## 2019-08-21 LAB — FERRITIN: Ferritin: 321 ng/mL — ABNORMAL HIGH (ref 11–307)

## 2019-08-21 LAB — LACTATE DEHYDROGENASE: LDH: 235 U/L — ABNORMAL HIGH (ref 98–192)

## 2019-08-22 ENCOUNTER — Other Ambulatory Visit: Payer: Self-pay | Admitting: Family

## 2019-08-22 DIAGNOSIS — D508 Other iron deficiency anemias: Secondary | ICD-10-CM

## 2019-08-22 DIAGNOSIS — D473 Essential (hemorrhagic) thrombocythemia: Secondary | ICD-10-CM

## 2019-09-01 ENCOUNTER — Telehealth: Payer: Self-pay | Admitting: Internal Medicine

## 2019-09-01 NOTE — Telephone Encounter (Signed)
Pt has an appt with Dr. Regis Bill on 07/13 and would like to know if she can wait until  08/18 to have her A1C checked with Dr.Ennever? Thanks

## 2019-09-05 NOTE — Telephone Encounter (Signed)
Please see message. Not sure if Dr. Marin Olp will check? Please advise.

## 2019-09-06 NOTE — Telephone Encounter (Signed)
I am ok with waiting  For her   a1c  And  ROV     Ask dr Siri Cole never if he can  get hg a1c with his blood draw labs  ( he was kind enough to do t his previously)   And then I can see her after that  With the results (  Can reschedule her appt )

## 2019-09-07 ENCOUNTER — Telehealth: Payer: Self-pay | Admitting: *Deleted

## 2019-09-07 NOTE — Telephone Encounter (Signed)
Patient called and stated,"Dr. Shelbie Hutching wants my HGBA1C drawn when I have my labs drawn in August. She is going to be sending over the order."

## 2019-09-08 ENCOUNTER — Other Ambulatory Visit: Payer: Self-pay | Admitting: *Deleted

## 2019-09-08 DIAGNOSIS — E119 Type 2 diabetes mellitus without complications: Secondary | ICD-10-CM

## 2019-09-08 NOTE — Telephone Encounter (Signed)
Pt stated if this info can be faxed to Dr. Marin Olp office.   Fax: [phone] ATTN: Erline Levine

## 2019-09-08 NOTE — Telephone Encounter (Signed)
I tried to fax to this number provided and it has not gone through after several attempts throughout the day. I called to find out if they have a different fax number and tried 7 times and no one could advise me. I did leave a message for his nurse also to check telephone encounter. I will keep trying until this goes through hopefully.

## 2019-09-08 NOTE — Telephone Encounter (Signed)
Finally received fax confirmation that request was sent!

## 2019-09-12 ENCOUNTER — Ambulatory Visit: Payer: Medicare PPO | Admitting: Internal Medicine

## 2019-09-25 ENCOUNTER — Other Ambulatory Visit: Payer: Self-pay | Admitting: Internal Medicine

## 2019-09-25 DIAGNOSIS — Z1231 Encounter for screening mammogram for malignant neoplasm of breast: Secondary | ICD-10-CM

## 2019-09-27 ENCOUNTER — Other Ambulatory Visit: Payer: Self-pay | Admitting: Family

## 2019-09-27 DIAGNOSIS — D473 Essential (hemorrhagic) thrombocythemia: Secondary | ICD-10-CM

## 2019-09-27 DIAGNOSIS — D508 Other iron deficiency anemias: Secondary | ICD-10-CM

## 2019-10-05 ENCOUNTER — Other Ambulatory Visit: Payer: Self-pay | Admitting: Hematology & Oncology

## 2019-10-09 ENCOUNTER — Other Ambulatory Visit: Payer: Self-pay | Admitting: Hematology & Oncology

## 2019-10-09 DIAGNOSIS — E119 Type 2 diabetes mellitus without complications: Secondary | ICD-10-CM

## 2019-10-18 ENCOUNTER — Inpatient Hospital Stay: Payer: Medicare PPO

## 2019-10-18 ENCOUNTER — Other Ambulatory Visit: Payer: Self-pay

## 2019-10-18 ENCOUNTER — Encounter: Payer: Self-pay | Admitting: Hematology & Oncology

## 2019-10-18 ENCOUNTER — Inpatient Hospital Stay: Payer: Medicare PPO | Attending: Hematology & Oncology | Admitting: Hematology & Oncology

## 2019-10-18 ENCOUNTER — Telehealth: Payer: Self-pay | Admitting: Hematology & Oncology

## 2019-10-18 VITALS — BP 139/75 | HR 79 | Temp 98.1°F | Resp 18 | Wt 129.8 lb

## 2019-10-18 DIAGNOSIS — D500 Iron deficiency anemia secondary to blood loss (chronic): Secondary | ICD-10-CM

## 2019-10-18 DIAGNOSIS — D473 Essential (hemorrhagic) thrombocythemia: Secondary | ICD-10-CM | POA: Diagnosis not present

## 2019-10-18 DIAGNOSIS — Z79899 Other long term (current) drug therapy: Secondary | ICD-10-CM | POA: Diagnosis not present

## 2019-10-18 DIAGNOSIS — E119 Type 2 diabetes mellitus without complications: Secondary | ICD-10-CM

## 2019-10-18 DIAGNOSIS — Z7982 Long term (current) use of aspirin: Secondary | ICD-10-CM | POA: Diagnosis not present

## 2019-10-18 LAB — CBC WITH DIFFERENTIAL (CANCER CENTER ONLY)
Abs Immature Granulocytes: 0.05 10*3/uL (ref 0.00–0.07)
Basophils Absolute: 0.1 10*3/uL (ref 0.0–0.1)
Basophils Relative: 1 %
Eosinophils Absolute: 0.1 10*3/uL (ref 0.0–0.5)
Eosinophils Relative: 2 %
HCT: 25.7 % — ABNORMAL LOW (ref 36.0–46.0)
Hemoglobin: 7.9 g/dL — ABNORMAL LOW (ref 12.0–15.0)
Immature Granulocytes: 1 %
Lymphocytes Relative: 28 %
Lymphs Abs: 1.8 10*3/uL (ref 0.7–4.0)
MCH: 24.9 pg — ABNORMAL LOW (ref 26.0–34.0)
MCHC: 30.7 g/dL (ref 30.0–36.0)
MCV: 81.1 fL (ref 80.0–100.0)
Monocytes Absolute: 0.6 10*3/uL (ref 0.1–1.0)
Monocytes Relative: 9 %
Neutro Abs: 3.7 10*3/uL (ref 1.7–7.7)
Neutrophils Relative %: 59 %
Platelet Count: 427 10*3/uL — ABNORMAL HIGH (ref 150–400)
RBC: 3.17 MIL/uL — ABNORMAL LOW (ref 3.87–5.11)
RDW: 18.6 % — ABNORMAL HIGH (ref 11.5–15.5)
WBC Count: 6.3 10*3/uL (ref 4.0–10.5)
nRBC: 0 % (ref 0.0–0.2)

## 2019-10-18 LAB — CMP (CANCER CENTER ONLY)
ALT: 13 U/L (ref 0–44)
AST: 14 U/L — ABNORMAL LOW (ref 15–41)
Albumin: 4.3 g/dL (ref 3.5–5.0)
Alkaline Phosphatase: 72 U/L (ref 38–126)
Anion gap: 7 (ref 5–15)
BUN: 32 mg/dL — ABNORMAL HIGH (ref 8–23)
CO2: 30 mmol/L (ref 22–32)
Calcium: 9.7 mg/dL (ref 8.9–10.3)
Chloride: 104 mmol/L (ref 98–111)
Creatinine: 1.11 mg/dL — ABNORMAL HIGH (ref 0.44–1.00)
GFR, Est AFR Am: 59 mL/min — ABNORMAL LOW (ref >60)
GFR, Estimated: 51 mL/min — ABNORMAL LOW (ref >60)
Glucose, Bld: 100 mg/dL — ABNORMAL HIGH (ref 70–99)
Potassium: 3.6 mmol/L (ref 3.5–5.1)
Sodium: 141 mmol/L (ref 135–145)
Total Bilirubin: 0.6 mg/dL (ref 0.3–1.2)
Total Protein: 7.6 g/dL (ref 6.5–8.1)

## 2019-10-18 LAB — HEMOGLOBIN A1C
Hgb A1c MFr Bld: 6.1 % — ABNORMAL HIGH (ref 4.8–5.6)
Mean Plasma Glucose: 128.37 mg/dL

## 2019-10-18 LAB — LACTATE DEHYDROGENASE: LDH: 218 U/L — ABNORMAL HIGH (ref 98–192)

## 2019-10-18 LAB — SAVE SMEAR(SSMR), FOR PROVIDER SLIDE REVIEW

## 2019-10-18 NOTE — Progress Notes (Signed)
[name]  It is a [employer].  Hematology and Oncology Follow Up Visit  Alexandra [name] [id number] 14-Apr-1950 69 y.o. 10/18/2019   Principle Diagnosis:   Anagrelide 5 mg po q day - started on 17/20/2020   Aspirin 81 mg by mouth daily  Folic acid 2 mg by mouth daily   Current Therapy:    Essential thrombocythemia-Calreticulin positive  Interim History:  [name] is back for followup.  Everything seems to be going pretty well for her.  She has had no problems with the anagrelide.  She has been pretty active.  She tries to walk and stay active.  She goes to the [facility] and enjoys walking there.  She has had no issues with her appetite.  She has had no nausea or vomiting.  She has had no change in bowel or bladder habits.  She has had no bleeding.  She has had no cough or shortness of breath.  There is been no rashes.  She has had no leg swelling.  I think she would be a candidate for the booster vaccine once it becomes available.  Of note, morning I saw her in June, her ferritin was 321 with an iron saturation of 34%.  Overall, her performance status is ECOG 1.  Medications:  Current Outpatient Medications:  .  amLODipine (NORVASC) 10 MG tablet, TAKE 1 TABLET BY MOUTH ONCE DAILY, Disp: 90 tablet, Rfl: 3 .  anagrelide (AGRYLIN) 1 MG capsule, TAKE 5 CAPSULES BY MOUTH DAILY, Disp: 150 capsule, Rfl: 4 .  aspirin 81 MG tablet, Take 81 mg by mouth daily., Disp: , Rfl:  .  Fe Fum-FePoly-Vit C-Vit B3 (INTEGRA) 62.5-62.5-40-3 MG CAPS, TAKE 1 CAPSULE BY MOUTH DAILY, Disp: 30 capsule, Rfl: 8 .  folic acid (FOLVITE) 1 MG tablet, Take 2 tablets by mouth once daily, Disp: 60 tablet, Rfl: 0 .  glucose blood (ONE TOUCH ULTRA TEST) test strip, Use to test blood sugar 1 times daily. **PT NEEDS FOLLOW UP APPT FOR FURTHER REFILLS**, Disp: 100 each, Rfl: 0 .  lisinopril (ZESTRIL) 20 MG tablet, TAKE 2 TABLETS BY MOUTH ONCE DAILY, Disp: 180 tablet, Rfl: 3 .  Nebivolol HCl  (BYSTOLIC) 20 MG TABS, Take 1 tablet (20 mg total) by mouth daily., Disp: 90 tablet, Rfl: 2 .  ONETOUCH DELICA LANCETS 66A MISC, Use to test blood sugar 1 time daily. **PT NEEDS FOLLOW UP APPT FOR FURTHER REFILLS**, Disp: 100 each, Rfl: 1 .  Vitamin D, Ergocalciferol, (DRISDOL) 1.25 MG (50000 UT) CAPS capsule, TAKE 1 CAPSULE BY MOUTH 1 TIME A WEEK, Disp: 12 capsule, Rfl: 2  Allergies: No Known Allergies  Past Medical History, Surgical history, Social history, and Family History were reviewed and updated.  Review of Systems: Review of Systems  All other systems reviewed and are negative. Marland Kitchen  Physical Exam:  weight is 129 lb 12 oz (58.9 kg). Her oral temperature is 98.1 F (36.7 C). Her blood pressure is 139/75 and her pulse is 79. Her respiration is 18 and oxygen saturation is 100%. =jh  Physical Exam Vitals reviewed.  HENT:     Head: Normocephalic and atraumatic.  Eyes:     Pupils: Pupils are equal, round, and reactive to light.  Cardiovascular:     Rate and Rhythm: Normal rate and regular rhythm.     Heart sounds: Normal heart sounds.  Pulmonary:     Effort: Pulmonary effort is normal.     Breath sounds: Normal breath sounds.  Abdominal:  General: Bowel sounds are normal.     Palpations: Abdomen is soft.  Musculoskeletal:        General: No tenderness or deformity. Normal range of motion.     Cervical back: Normal range of motion.  Lymphadenopathy:     Cervical: No cervical adenopathy.  Skin:    General: Skin is warm and dry.     Findings: No erythema or rash.  Neurological:     Mental Status: She is alert and oriented to person, place, and time.  Psychiatric:        Behavior: Behavior normal.        Thought Content: Thought content normal.        Judgment: Judgment normal.      Lab Results  Component Value Date   WBC 6.3 10/18/2019   HGB 7.9 (L) 10/18/2019   HCT 25.7 (L) 10/18/2019   MCV 81.1 10/18/2019   PLT 427 (H) 10/18/2019     Chemistry       Component Value Date/Time   NA 141 10/18/2019 1128   NA 144 02/02/2017 1158   NA 140 07/24/2016 1007   K 3.6 10/18/2019 1128   K 3.7 02/02/2017 1158   K 3.6 07/24/2016 1007   CL 104 10/18/2019 1128   CL 103 02/02/2017 1158   CO2 30 10/18/2019 1128   CO2 29 02/02/2017 1158   CO2 26 07/24/2016 1007   BUN 32 (H) 10/18/2019 1128   BUN 20 02/02/2017 1158   BUN 30.3 (H) 07/24/2016 1007   CREATININE 1.11 (H) 10/18/2019 1128   CREATININE 0.9 02/02/2017 1158   CREATININE 1.1 07/24/2016 1007   GLU 129 02/02/2017 0000      Component Value Date/Time   CALCIUM 9.7 10/18/2019 1128   CALCIUM 9.0 02/02/2017 1158   CALCIUM 9.3 07/24/2016 1007   ALKPHOS 72 10/18/2019 1128   ALKPHOS 110 (H) 02/02/2017 1158   ALKPHOS 104 07/24/2016 1007   AST 14 (L) 10/18/2019 1128   AST 13 07/24/2016 1007   ALT 13 10/18/2019 1128   ALT 26 02/02/2017 1158   ALT 15 07/24/2016 1007   BILITOT 0.6 10/18/2019 1128   BILITOT 0.55 07/24/2016 1007      Impression and Plan: Alexandra [name] is 68 year old [name]. She has essential thrombocythemia. She actually is Calreticulin positive.  I am very encouraged by the fact that her platelet count is still holding stable.  I noted however that her red cell count is down a little bit.  Hemoglobin today is 7.9.  Again, she is asymptomatic with this.  I just think that her body has adapted to a low hemoglobin state.   I think we can now get her back in 2 months.  Again everything is holding pretty stable.  She always knows she can come back sooner if she has any problems.     [name], MD 8/18/202112:20 PM

## 2019-10-18 NOTE — Telephone Encounter (Signed)
Appointments scheduled calendar printed per 8/18 los

## 2019-10-19 ENCOUNTER — Ambulatory Visit
Admission: RE | Admit: 2019-10-19 | Discharge: 2019-10-19 | Disposition: A | Discharge status: Home / Self Care | Payer: Medicare PPO | Source: Ambulatory Visit | Attending: Internal Medicine | Admitting: Internal Medicine

## 2019-10-19 DIAGNOSIS — Z1231 Encounter for screening mammogram for malignant neoplasm of breast: Secondary | ICD-10-CM

## 2019-10-19 LAB — IRON AND TIBC
Iron: 71 ug/dL (ref 41–142)
Saturation Ratios: 34 % (ref 21–57)
TIBC: 209 ug/dL — ABNORMAL LOW (ref 236–444)
UIBC: 137 ug/dL (ref 120–384)

## 2019-10-19 LAB — FERRITIN: Ferritin: 290 ng/mL (ref 11–307)

## 2019-11-07 ENCOUNTER — Other Ambulatory Visit: Payer: Self-pay | Admitting: Family

## 2019-11-07 DIAGNOSIS — D473 Essential (hemorrhagic) thrombocythemia: Secondary | ICD-10-CM

## 2019-11-07 DIAGNOSIS — D508 Other iron deficiency anemias: Secondary | ICD-10-CM

## 2019-12-07 ENCOUNTER — Other Ambulatory Visit: Payer: Self-pay | Admitting: Hematology & Oncology

## 2019-12-12 ENCOUNTER — Telehealth: Payer: Self-pay | Admitting: Internal Medicine

## 2019-12-12 NOTE — Telephone Encounter (Signed)
Left message for patient to call back and schedule Medicare Annual Wellness Visit (AWV) either virtually or in office.  Last AWV 05/19/16; please schedule at anytime with [facility] Nurse Health Advisor 2.  This should be a 45 minute visit.

## 2019-12-15 ENCOUNTER — Inpatient Hospital Stay: Payer: Medicare PPO | Attending: Hematology & Oncology | Admitting: Hematology & Oncology

## 2019-12-15 ENCOUNTER — Inpatient Hospital Stay: Payer: Medicare PPO

## 2019-12-15 ENCOUNTER — Encounter: Payer: Self-pay | Admitting: Hematology & Oncology

## 2019-12-15 ENCOUNTER — Other Ambulatory Visit: Payer: Self-pay

## 2019-12-15 DIAGNOSIS — D473 Essential (hemorrhagic) thrombocythemia: Secondary | ICD-10-CM | POA: Diagnosis not present

## 2019-12-15 DIAGNOSIS — D500 Iron deficiency anemia secondary to blood loss (chronic): Secondary | ICD-10-CM

## 2019-12-15 DIAGNOSIS — N189 Chronic kidney disease, unspecified: Secondary | ICD-10-CM

## 2019-12-15 DIAGNOSIS — D631 Anemia in chronic kidney disease: Secondary | ICD-10-CM | POA: Diagnosis not present

## 2019-12-15 DIAGNOSIS — Z79899 Other long term (current) drug therapy: Secondary | ICD-10-CM | POA: Diagnosis not present

## 2019-12-15 DIAGNOSIS — Z7982 Long term (current) use of aspirin: Secondary | ICD-10-CM | POA: Diagnosis not present

## 2019-12-15 DIAGNOSIS — D471 Chronic myeloproliferative disease: Secondary | ICD-10-CM | POA: Diagnosis not present

## 2019-12-15 DIAGNOSIS — Z7189 Other specified counseling: Secondary | ICD-10-CM

## 2019-12-15 DIAGNOSIS — E119 Type 2 diabetes mellitus without complications: Secondary | ICD-10-CM

## 2019-12-15 HISTORY — DX: Anemia in chronic kidney disease: D63.1

## 2019-12-15 HISTORY — DX: Other specified counseling: Z71.89

## 2019-12-15 HISTORY — DX: Chronic myeloproliferative disease: D47.1

## 2019-12-15 LAB — CBC WITH DIFFERENTIAL (CANCER CENTER ONLY)
Abs Immature Granulocytes: 0.02 10*3/uL (ref 0.00–0.07)
Basophils Absolute: 0.1 10*3/uL (ref 0.0–0.1)
Basophils Relative: 1 %
Eosinophils Absolute: 0.1 10*3/uL (ref 0.0–0.5)
Eosinophils Relative: 2 %
HCT: 23.8 % — ABNORMAL LOW (ref 36.0–46.0)
Hemoglobin: 7.5 g/dL — ABNORMAL LOW (ref 12.0–15.0)
Immature Granulocytes: 0 %
Lymphocytes Relative: 29 %
Lymphs Abs: 1.6 10*3/uL (ref 0.7–4.0)
MCH: 25.2 pg — ABNORMAL LOW (ref 26.0–34.0)
MCHC: 31.5 g/dL (ref 30.0–36.0)
MCV: 79.9 fL — ABNORMAL LOW (ref 80.0–100.0)
Monocytes Absolute: 0.7 10*3/uL (ref 0.1–1.0)
Monocytes Relative: 12 %
Neutro Abs: 3.2 10*3/uL (ref 1.7–7.7)
Neutrophils Relative %: 56 %
Platelet Count: 325 10*3/uL (ref 150–400)
RBC: 2.98 MIL/uL — ABNORMAL LOW (ref 3.87–5.11)
RDW: 19 % — ABNORMAL HIGH (ref 11.5–15.5)
WBC Count: 5.7 10*3/uL (ref 4.0–10.5)
nRBC: 0 % (ref 0.0–0.2)

## 2019-12-15 LAB — CMP (CANCER CENTER ONLY)
ALT: 12 U/L (ref 0–44)
AST: 15 U/L (ref 15–41)
Albumin: 4 g/dL (ref 3.5–5.0)
Alkaline Phosphatase: 80 U/L (ref 38–126)
Anion gap: 7 (ref 5–15)
BUN: 31 mg/dL — ABNORMAL HIGH (ref 8–23)
CO2: 26 mmol/L (ref 22–32)
Calcium: 9.4 mg/dL (ref 8.9–10.3)
Chloride: 106 mmol/L (ref 98–111)
Creatinine: 1.16 mg/dL — ABNORMAL HIGH (ref 0.44–1.00)
GFR, Estimated: 48 mL/min — ABNORMAL LOW (ref >60)
Glucose, Bld: 101 mg/dL — ABNORMAL HIGH (ref 70–99)
Potassium: 3.8 mmol/L (ref 3.5–5.1)
Sodium: 139 mmol/L (ref 135–145)
Total Bilirubin: 0.6 mg/dL (ref 0.3–1.2)
Total Protein: 6.6 g/dL (ref 6.5–8.1)

## 2019-12-15 LAB — SAVE SMEAR(SSMR), FOR PROVIDER SLIDE REVIEW

## 2019-12-15 LAB — LACTATE DEHYDROGENASE: LDH: 197 U/L — ABNORMAL HIGH (ref 98–192)

## 2019-12-15 NOTE — Progress Notes (Signed)
[name]  [employer] is [employer].  Hematology and Oncology Follow Up Visit  Alexandra [name] [id number] 25-Jun-1950 69 y.o. 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 is back for followup. She looks pretty good. Her platelet count continues to come down. Her platelet count today was 325,000.  Her hemoglobin is also dropping slowly. Today, her hemoglobin is 7.5. I think that she would be a candidate for Aranesp. Her erythropoietin level is only 44. I think she would benefit from getting her hemoglobin little bit higher.  I talked to her about using Aranesp. I explained how it is given. I explained some of the side effects. I really think that the potential benefit outweighs risk.  She has had no problems with bleeding or bruising. Her appetite is good. She is active. She has been walking quite a bit.  She has had no pain in the hands or feet. She has had no rashes. She has had no change in bowel or bladder habits.  Overall, her performance status is ECOG 1.  Medications:  Current Outpatient Medications:  .  amLODipine (NORVASC) 10 MG tablet, TAKE 1 TABLET BY MOUTH ONCE DAILY, Disp: 90 tablet, Rfl: 3 .  anagrelide (AGRYLIN) 1 MG capsule, TAKE 5 CAPSULES BY MOUTH DAILY, Disp: 450 capsule, Rfl: 3 .  aspirin 81 MG tablet, Take 81 mg by mouth daily., Disp: , Rfl:  .  Fe Fum-FePoly-Vit C-Vit B3 (INTEGRA) 62.5-62.5-40-3 MG CAPS, TAKE 1 CAPSULE BY MOUTH DAILY, Disp: 30 capsule, Rfl: 8 .  folic acid (FOLVITE) 1 MG tablet, Take 2 tablets by mouth once daily, Disp: 60 tablet, Rfl: 0 .  glucose blood (ONE TOUCH ULTRA TEST) test strip, Use to test blood sugar 1 times daily. **PT NEEDS FOLLOW UP APPT FOR FURTHER  REFILLS**, Disp: 100 each, Rfl: 0 .  lisinopril (ZESTRIL) 20 MG tablet, TAKE 2 TABLETS BY MOUTH ONCE DAILY, Disp: 180 tablet, Rfl: 3 .  Nebivolol HCl (BYSTOLIC) 20 MG TABS, Take 1 tablet (20 mg total) by mouth daily., Disp: 90 tablet, Rfl: 2 .  ONETOUCH DELICA LANCETS 32K MISC, Use to test blood sugar 1 time daily. **PT NEEDS FOLLOW UP APPT FOR FURTHER REFILLS**, Disp: 100 each, Rfl: 1 .  Vitamin D, Ergocalciferol, (DRISDOL) 1.25 MG (50000 UT) CAPS capsule, TAKE 1 CAPSULE BY MOUTH 1 TIME A WEEK, Disp: 12 capsule, Rfl: 2  Allergies: No Known Allergies  Past Medical History, Surgical history, Social history, and Family History were reviewed and updated.  Review of Systems: Review of Systems  All other systems reviewed and are negative. Marland Kitchen  Physical Exam:  vitals were not taken for this visit. =jh  Physical Exam Vitals reviewed.  HENT:     Head: Normocephalic and atraumatic.  Eyes:     Pupils: Pupils are equal, round, and reactive to light.  Cardiovascular:     Rate and Rhythm: Normal rate and regular rhythm.     Heart sounds: Normal heart sounds.  Pulmonary:     Effort: Pulmonary effort is normal.     Breath sounds: Normal breath sounds.  Abdominal:     General: Bowel sounds are normal.  Palpations: Abdomen is soft.  Musculoskeletal:        General: No tenderness or deformity. Normal range of motion.     Cervical back: Normal range of motion.  Lymphadenopathy:     Cervical: No cervical adenopathy.  Skin:    General: Skin is warm and dry.     Findings: No erythema or rash.  Neurological:     Mental Status: She is alert and oriented to person, place, and time.  Psychiatric:        Behavior: Behavior normal.        Thought Content: Thought content normal.        Judgment: Judgment normal.      Lab Results  Component Value Date   WBC 5.7 12/15/2019   HGB 7.5 (L) 12/15/2019   HCT 23.8 (L) 12/15/2019   MCV 79.9 (L) 12/15/2019   PLT 325 12/15/2019     Chemistry       Component Value Date/Time   NA 139 12/15/2019 1108   NA 144 02/02/2017 1158   NA 140 07/24/2016 1007   K 3.8 12/15/2019 1108   K 3.7 02/02/2017 1158   K 3.6 07/24/2016 1007   CL 106 12/15/2019 1108   CL 103 02/02/2017 1158   CO2 26 12/15/2019 1108   CO2 29 02/02/2017 1158   CO2 26 07/24/2016 1007   BUN 31 (H) 12/15/2019 1108   BUN 20 02/02/2017 1158   BUN 30.3 (H) 07/24/2016 1007   CREATININE 1.16 (H) 12/15/2019 1108   CREATININE 0.9 02/02/2017 1158   CREATININE 1.1 07/24/2016 1007   GLU 129 02/02/2017 0000      Component Value Date/Time   CALCIUM 9.4 12/15/2019 1108   CALCIUM 9.0 02/02/2017 1158   CALCIUM 9.3 07/24/2016 1007   ALKPHOS 80 12/15/2019 1108   ALKPHOS 110 (H) 02/02/2017 1158   ALKPHOS 104 07/24/2016 1007   AST 15 12/15/2019 1108   AST 13 07/24/2016 1007   ALT 12 12/15/2019 1108   ALT 26 02/02/2017 1158   ALT 15 07/24/2016 1007   BILITOT 0.6 12/15/2019 1108   BILITOT 0.55 07/24/2016 1007      Impression and Plan: Alexandra [name] is 69 year old [name]. She has essential thrombocythemia. She actually is Calreticulin positive.  I am very encouraged by the fact that her platelet count is still relatively normal. I just wish that her hemoglobin was a little bit better.  It is amazing that she really is not that symptomatic from the anemia. However, I think she will become symptomatic if her hemoglobin continues to drop.  We will see about starting Aranesp on her. We will try to get Aranesp approved and start next week.  I would like to see her back in 4 weeks. Hopefully, we will see her hemoglobin a little bit better at that time.    [name], MD 10/15/202111:45 AM

## 2019-12-16 LAB — HEMOGLOBIN A1C
Hgb A1c MFr Bld: 5.9 % — ABNORMAL HIGH (ref 4.8–5.6)
Mean Plasma Glucose: 123 mg/dL

## 2019-12-18 ENCOUNTER — Other Ambulatory Visit: Payer: Self-pay | Admitting: Family

## 2019-12-18 DIAGNOSIS — D631 Anemia in chronic kidney disease: Secondary | ICD-10-CM

## 2019-12-18 DIAGNOSIS — D473 Essential (hemorrhagic) thrombocythemia: Secondary | ICD-10-CM

## 2019-12-18 DIAGNOSIS — D508 Other iron deficiency anemias: Secondary | ICD-10-CM

## 2019-12-18 LAB — IRON AND TIBC
Iron: 64 ug/dL (ref 41–142)
Saturation Ratios: 30 % (ref 21–57)
TIBC: 210 ug/dL — ABNORMAL LOW (ref 236–444)
UIBC: 147 ug/dL (ref 120–384)

## 2019-12-18 LAB — FERRITIN: Ferritin: 289 ng/mL (ref 11–307)

## 2019-12-20 ENCOUNTER — Inpatient Hospital Stay: Payer: Medicare PPO

## 2019-12-20 ENCOUNTER — Other Ambulatory Visit: Payer: Self-pay

## 2019-12-20 ENCOUNTER — Ambulatory Visit: Payer: Medicare PPO

## 2019-12-20 DIAGNOSIS — Z7982 Long term (current) use of aspirin: Secondary | ICD-10-CM | POA: Diagnosis not present

## 2019-12-20 DIAGNOSIS — N189 Chronic kidney disease, unspecified: Secondary | ICD-10-CM | POA: Diagnosis not present

## 2019-12-20 DIAGNOSIS — Z79899 Other long term (current) drug therapy: Secondary | ICD-10-CM | POA: Diagnosis not present

## 2019-12-20 DIAGNOSIS — D473 Essential (hemorrhagic) thrombocythemia: Secondary | ICD-10-CM | POA: Diagnosis not present

## 2019-12-20 DIAGNOSIS — D631 Anemia in chronic kidney disease: Secondary | ICD-10-CM

## 2019-12-21 LAB — ERYTHROPOIETIN: Erythropoietin: 46.9 m[IU]/mL — ABNORMAL HIGH (ref 2.6–18.5)

## 2019-12-21 NOTE — Telephone Encounter (Signed)
Patient called returning your call will wait for a call back

## 2019-12-25 ENCOUNTER — Other Ambulatory Visit: Payer: Self-pay | Admitting: Family

## 2019-12-26 ENCOUNTER — Ambulatory Visit (INDEPENDENT_AMBULATORY_CARE_PROVIDER_SITE_OTHER): Payer: Medicare PPO

## 2019-12-26 ENCOUNTER — Other Ambulatory Visit: Payer: Self-pay

## 2019-12-26 DIAGNOSIS — Z0000 Encounter for general adult medical examination without abnormal findings: Secondary | ICD-10-CM | POA: Diagnosis not present

## 2019-12-26 NOTE — Patient Instructions (Addendum)
Alexandra [name] , Thank you for taking time to come for your Medicare Wellness Visit. I appreciate your ongoing commitment to your health goals. Please review the following plan we discussed and let me know if I can assist you in the future.   Screening recommendations/referrals: Colonoscopy: Pt declines  Mammogram: Pt declined Bone density Done 07/20/16 Recommended yearly ophthalmology/optometry visit for glaucoma screening and checkup Recommended yearly dental visit for hygiene and checkup  Vaccinations: Influenza vaccine: Declined and discussed  Pneumococcal vaccine: Declined and discussed  Tdap vaccine: Declined and discussed  Shingles vaccine: Shingrix discussed. Please contact your pharmacy for coverage information.    Covid-19:Completed 05/25/19 & 06/19/19  Advanced directives: Advance directive discussed with you today. I have provided a copy for you to complete at home and have notarized. Once this is complete please bring a copy in to our office so we can scan it into your chart.  Conditions/risks identified: Increase walking to 5 day and 30 minutes and keep blood pressure contolled  Next appointment: Follow up in one year for your annual wellness visit    Preventive Care 65 Years and Older, Female Preventive care refers to lifestyle choices and visits with your health care provider that can promote health and wellness. What does preventive care include?  A yearly physical exam. This is also called an annual well check.  Dental exams once or twice a year.  Routine eye exams. Ask your health care provider how often you should have your eyes checked.  Personal lifestyle choices, including:  Daily care of your teeth and gums.  Regular physical activity.  Eating a healthy diet.  Avoiding tobacco and drug use.  Limiting alcohol use.  Practicing safe sex.  Taking low-dose aspirin every day.  Taking vitamin and mineral supplements as recommended by your health care  provider. What happens during an annual well check? The services and screenings done by your health care provider during your annual well check will depend on your age, overall health, lifestyle risk factors, and family history of disease. Counseling  Your health care provider may ask you questions about your:  Alcohol use.  Tobacco use.  Drug use.  Emotional well-being.  Home and relationship well-being.  Sexual activity.  Eating habits.  History of falls.  Memory and ability to understand (cognition).  Work and work Statistician.  Reproductive health. Screening  You may have the following tests or measurements:  Height, weight, and BMI.  Blood pressure.  Lipid and cholesterol levels. These may be checked every 5 years, or more frequently if you are over 25 years old.  Skin check.  Lung cancer screening. You may have this screening every year starting at age 72 if you have a 30-pack-year history of smoking and currently smoke or have quit within the past 15 years.  Fecal occult blood test (FOBT) of the stool. You may have this test every year starting at age 40.  Flexible sigmoidoscopy or colonoscopy. You may have a sigmoidoscopy every 5 years or a colonoscopy every 10 years starting at age 64.  Hepatitis C blood test.  Hepatitis B blood test.  Sexually transmitted disease (STD) testing.  Diabetes screening. This is done by checking your blood sugar (glucose) after you have not eaten for a while (fasting). You may have this done every 1-3 years.  Bone density scan. This is done to screen for osteoporosis. You may have this done starting at age 45.  Mammogram. This may be done every 1-2 years. Talk to  your health care provider about how often you should have regular mammograms. Talk with your health care provider about your test results, treatment options, and if necessary, the need for more tests. Vaccines  Your health care provider may recommend certain  vaccines, such as:  Influenza vaccine. This is recommended every year.  Tetanus, diphtheria, and acellular pertussis (Tdap, Td) vaccine. You may need a Td booster every 10 years.  Zoster vaccine. You may need this after age 50.  Pneumococcal 13-valent conjugate (PCV13) vaccine. One dose is recommended after age 63.  Pneumococcal polysaccharide (PPSV23) vaccine. One dose is recommended after age 49. Talk to your health care provider about which screenings and vaccines you need and how often you need them. This information is not intended to replace advice given to you by your health care provider. Make sure you discuss any questions you have with your health care provider. Document Released: 03/15/2015 Document Revised: 11/06/2015 Document Reviewed: 12/18/2014 Elsevier Interactive Patient Education  2017 [employer] Prevention in the Home Falls can cause injuries. They can happen to people of all ages. There are many things you can do to make your home safe and to help prevent falls. What can I do on the outside of my home?  Regularly fix the edges of walkways and driveways and fix any cracks.  Remove anything that might make you trip as you walk through a door, such as a raised step or threshold.  Trim any bushes or trees on the path to your home.  Use bright outdoor lighting.  Clear any walking paths of anything that might make someone trip, such as rocks or tools.  Regularly check to see if handrails are loose or broken. Make sure that both sides of any steps have handrails.  Any raised decks and porches should have guardrails on the edges.  Have any leaves, snow, or ice cleared regularly.  Use sand or salt on walking paths during winter.  Clean up any spills in your garage right away. This includes oil or grease spills. What can I do in the bathroom?  Use night lights.  Install grab bars by the toilet and in the tub and shower. Do not use towel bars as grab  bars.  Use non-skid mats or decals in the tub or shower.  If you need to sit down in the shower, use a plastic, non-slip stool.  Keep the floor dry. Clean up any water that spills on the floor as soon as it happens.  Remove soap buildup in the tub or shower regularly.  Attach bath mats securely with double-sided non-slip rug tape.  Do not have throw rugs and other things on the floor that can make you trip. What can I do in the bedroom?  Use night lights.  Make sure that you have a light by your bed that is easy to reach.  Do not use any sheets or blankets that are too big for your bed. They should not hang down onto the floor.  Have a firm chair that has side arms. You can use this for support while you get dressed.  Do not have throw rugs and other things on the floor that can make you trip. What can I do in the kitchen?  Clean up any spills right away.  Avoid walking on wet floors.  Keep items that you use a lot in easy-to-reach places.  If you need to reach something above you, use a strong step stool that has  a grab bar.  Keep electrical cords out of the way.  Do not use floor polish or wax that makes floors slippery. If you must use wax, use non-skid floor wax.  Do not have throw rugs and other things on the floor that can make you trip. What can I do with my stairs?  Do not leave any items on the stairs.  Make sure that there are handrails on both sides of the stairs and use them. Fix handrails that are broken or loose. Make sure that handrails are as long as the stairways.  Check any carpeting to make sure that it is firmly attached to the stairs. Fix any carpet that is loose or worn.  Avoid having throw rugs at the top or bottom of the stairs. If you do have throw rugs, attach them to the floor with carpet tape.  Make sure that you have a light switch at the top of the stairs and the bottom of the stairs. If you do not have them, ask someone to add them for  you. What else can I do to help prevent falls?  Wear shoes that:  Do not have high heels.  Have rubber bottoms.  Are comfortable and fit you well.  Are closed at the toe. Do not wear sandals.  If you use a stepladder:  Make sure that it is fully opened. Do not climb a closed stepladder.  Make sure that both sides of the stepladder are locked into place.  Ask someone to hold it for you, if possible.  Clearly mark and make sure that you can see:  Any grab bars or handrails.  First and last steps.  Where the edge of each step is.  Use tools that help you move around (mobility aids) if they are needed. These include:  Canes.  Walkers.  Scooters.  Crutches.  Turn on the lights when you go into a dark area. Replace any light bulbs as soon as they burn out.  Set up your furniture so you have a clear path. Avoid moving your furniture around.  If any of your floors are uneven, fix them.  If there are any pets around you, be aware of where they are.  Review your medicines with your doctor. Some medicines can make you feel dizzy. This can increase your chance of falling. Ask your doctor what other things that you can do to help prevent falls. This information is not intended to replace advice given to you by your health care provider. Make sure you discuss any questions you have with your health care provider. Document Released: 12/13/2008 Document Revised: 07/25/2015 Document Reviewed: 03/23/2014 Elsevier Interactive Patient Education  2017 [employer].

## 2019-12-26 NOTE — Progress Notes (Signed)
Virtual Visit via Telephone Note  I connected with  Alexandra [name] on 12/26/19 at  8:45 AM EDT by telephone and verified that I am speaking with the correct person using two identifiers.  Medicare Annual Wellness visit completed telephonically due to Covid-19 pandemic.   Persons participating in this call: This Health Coach and this patient.   Location: Patient: Home Provider: Office   I discussed the limitations, risks, security and privacy concerns of performing an evaluation and management service by telephone and the availability of in person appointments. The patient expressed understanding and agreed to proceed.  Unable to perform video visit due to video visit attempted and failed and/or patient does not have video capability.   Some vital signs may be absent or patient reported.   Willette Brace, LPN    Subjective:   Alexandra [name] is a 69 y.o. female who presents for Medicare Annual (Subsequent) preventive examination.  Review of Systems     Cardiac Risk Factors include: advanced age (>67mn, >>24women);diabetes mellitus;hypertension     Objective:    There were no vitals filed for this visit. There is no height or weight on file to calculate BMI.  Advanced Directives 12/26/2019 12/15/2019 10/18/2019 08/18/2019 07/18/2019 06/09/2019 04/27/2019  Does Patient Have a Medical Advance Directive? Yes _0  No  Does patient want to make changes to medical advance directive? Yes (MAU/Ambulatory/Procedural Areas - Information given) - - - - - -  Would patient like information on creating a medical advance directive? - No - Patient declined No - Patient declined No - Patient declined No - Patient declined No - Patient declined No - Patient declined    Current Medications (verified) Outpatient Encounter Medications as of 12/26/2019  Medication Sig  . amLODipine (NORVASC) 10 MG tablet TAKE 1 TABLET BY MOUTH ONCE DAILY  . anagrelide (AGRYLIN) 1 MG capsule TAKE 5  CAPSULES BY MOUTH DAILY  . aspirin 81 MG tablet Take 81 mg by mouth daily.  . Fe Fum-FePoly-Vit C-Vit B3 (INTEGRA) 62.5-62.5-40-3 MG CAPS TAKE 1 CAPSULE BY MOUTH DAILY  . folic acid (FOLVITE) 1 MG tablet Take 2 tablets by mouth once daily  . glucose blood (ONE TOUCH ULTRA TEST) test strip Use to test blood sugar 1 times daily. **PT NEEDS FOLLOW UP APPT FOR FURTHER REFILLS**  . lisinopril (ZESTRIL) 20 MG tablet TAKE 2 TABLETS BY MOUTH ONCE DAILY  . Nebivolol HCl (BYSTOLIC) 20 MG TABS Take 1 tablet (20 mg total) by mouth daily.  .Glory RosebushDELICA LANCETS 300PMISC Use to test blood sugar 1 time daily. **PT NEEDS FOLLOW UP APPT FOR FURTHER REFILLS**  . Vitamin D, Ergocalciferol, (DRISDOL) 1.25 MG (50000 UT) CAPS capsule TAKE 1 CAPSULE BY MOUTH 1 TIME A WEEK   No facility-administered encounter medications on file as of 12/26/2019.    Allergies (verified) Patient has no known allergies.   History: Past Medical History:  Diagnosis Date  . [place]   . 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 Goals of care, counseling/discussion 12/15/2019  . Hypertension    echo   . Iron deficiency anemia, unspecified 03/15/2013  . My[place]ifer[place]oplasm [place] 12/15/2019  . Thrombocytosis    Past Surgical History:  Procedure Laterality Date  . BONE MARROW BIOPSY    . ECTOPIC PREGNANCY SURGERY  1984   Family History  Problem Relation Age of Onset  . Heart disease Mother        died age 69 .  Kidney disease Mother        family hx  . Heart attack Father 62       massive  . Hypertension Sister   . Hypertension Sister   . Hypertension Sister   . Hypertension Sister   . Arthritis Other        family hx  . Diabetes Other        family hx  . Stroke Other         dialysis   Social History   Socioeconomic History  . Marital status: Married    Spouse name: Not on file  . Number of children: Not on file  . Years of education: Not on file  .  Highest education level: Not on file  Occupational History  . Occupation: retired  Tobacco Use  . Smoking status: Never Smoker  . Smokeless tobacco: Never Used  . Tobacco comment: never used tobacco  Vaping Use  . Vaping Use: Never used  Substance and Sexual Activity  . Alcohol use: No    Alcohol/week: 0.0 standard drinks  . Drug use: No  . Sexual activity: Not on file  Other Topics Concern  . 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   No tobacco no ethoh little caffiene .    Social Determinants of Health   Financial Resource Strain: Low Risk   . Difficulty of Paying Living Expenses: Not hard at all  Food Insecurity: No Food Insecurity  . Worried About Running Out of Food in the Last Year: Never true  . Ran Out of Food in the Last Year: Never true  Transportation Needs: No Transportation Needs  . Lack of Transportation (Medical): No  . Lack of Transportation (Non-Medical): No  Physical Activity: Insufficiently Active  . Days of Exercise per Week: 3 days  . Minutes of Exercise per Session: 30 min  Stress: No Stress Concern Present  . Feeling of Stress : Not at all  Social Connections: Moderately Integrated  . Frequency of Communication with Friends and Family: Twice a week  . Frequency of Social Gatherings with Friends and Family: Once a week  . Attends Religious Services: More than 4 times per year  . Active Member of Clubs or Organizations: No  . Attends Club or Organization Meetings: Never  . Marital Status: Married    Tobacco Counseling Counseling given: Not Answered Comment: never used tobacco   Clinical Intake:  Pre-visit preparation completed: Yes  Pain : No/denies pain     BMI - recorded: 17.71 Nutritional Status: BMI <19  Underweight Nutritional Risks: None Diabetes: Yes  How often do you need to have someone help you when you read instructions, pamphlets, or other  written materials from your doctor or pharmacy?: 1 - Never  Diabetic?Yes  Interpreter Needed?: No  Information entered by :: Tina Betterson, LPN   Activities of Daily Living In your present state of health, do you have any difficulty performing the following activities: 12/26/2019  Hearing? N  Vision? N  Difficulty concentrating or making decisions? N  Walking or climbing stairs? N  Comment at times  Dressing or bathing? N  Doing errands, shopping? N  Preparing Food and eating ? N  Using the Toilet? N  In the past six months, have you accidently leaked urine? N  Do you have problems with loss   of bowel control? N  Managing your Medications? N  Managing your Finances? N  Housekeeping or managing your Housekeeping? N  Some recent data might be hidden    Patient Care Team: Panosh, Standley Brooking, MD as PCP - General Marin Olp Rudell Cobb, MD as Attending Physician (Hematology and Oncology) Philemon Kingdom, MD as Consulting Physician (Internal Medicine)  Indicate any recent Medical Services you may have received from other than [facility] providers in the past year (date may be approximate).     Assessment:   This is a routine wellness examination for Alexandra [name].  Hearing/Vision screen  Hearing Screening   125Hz 250Hz 500Hz 1000Hz 2000Hz 3000Hz 4000Hz 6000Hz 8000Hz  Right ear:           Left ear:           Comments: Pt denies any difficulty hearing  Vision Screening Comments: Pt follows up with Dr Katy Fitch for annually for eye exams  Dietary issues and exercise activities discussed: Current Exercise Habits: Home exercise routine, Type of exercise: walking, Time (Minutes): 30, Frequency (Times/Week): 3, Weekly Exercise (Minutes/Week): 90  Goals    . Exercise 150 minutes per week (moderate activity)     Try to walk 5 days a week x 30 minutes     . Patient Stated     Keep blood pressure controlled and increase walking to 5 days a week and 30 minutes      Depression Screen PHQ 2/9  Scores 12/26/2019 10/18/2017 10/18/2017 05/21/2016 05/19/2016 05/08/2013 03/15/2013  PHQ - 2 Score 0 0 0 0 0 0 0  PHQ- 9 Score - - 0 - - - -    Fall Risk Fall Risk  12/26/2019 10/18/2017 05/21/2016 05/19/2016 01/29/2016  Falls in the past year? 0 No No No No  Number falls in past yr: 0 - - - -  Injury with Fall? 0 - - - -  Risk for fall due to : - - - - -  Follow up Falls prevention discussed - - - -    Any stairs in or around the home? No  If so, are there any without handrails? No  Home free of loose throw rugs in walkways, pet beds, electrical cords, etc? Yes  Adequate lighting in your home to reduce risk of falls? Yes   ASSISTIVE DEVICES UTILIZED TO PREVENT FALLS:  Life alert? No  Use of a cane, walker or w/c? No  Grab bars in the bathroom? No  Shower chair or bench in shower? No  Elevated toilet seat or a handicapped toilet? Yes   TIMED UP AND GO:  Was the test performed? No .     Cognitive Function: MMSE - Mini Mental State Exam 05/19/2016  Not completed: (No Data)     6CIT Screen 12/26/2019  What Year? 0 points  What month? 0 points  Count back from 20 0 points  Months in reverse 0 points  Repeat phrase 4 points    Immunizations Immunization History  Administered Date(s) Administered  . PFIZER SARS-COV-2 Vaccination 05/25/2019, 06/19/2019  . PPD Test 03/15/2017    TDAP status: Due, Education has been provided regarding the importance of this vaccine. Advised may receive this vaccine at local pharmacy or Health Dept. Aware to provide a copy of the vaccination record if obtained from local pharmacy or Health Dept. Verbalized acceptance and understanding. Flu Vaccine status: Declined, Education has been provided regarding the importance of this vaccine but patient still declined. Advised may receive this vaccine at  local pharmacy or Health Dept. Aware to provide a copy of the vaccination record if obtained from local pharmacy or Health Dept. Verbalized acceptance and  understanding. Pneumococcal vaccine status: Declined,  Education has been provided regarding the importance of this vaccine but patient still declined. Advised may receive this vaccine at local pharmacy or Health Dept. Aware to provide a copy of the vaccination record if obtained from local pharmacy or Health Dept. Verbalized acceptance and understanding.  Covid-19 vaccine status: Completed vaccines  Qualifies for Shingles Vaccine? Yes   Zostavax completed No   Shingrix Completed?: No.    Education has been provided regarding the importance of this vaccine. Patient has been advised to call insurance company to determine out of pocket expense if they have not yet received this vaccine. Advised may also receive vaccine at local pharmacy or Health Dept. Verbalized acceptance and understanding.  Screening Tests Health Maintenance  Topic Date Due  . INFLUENZA VACCINE  05/30/2020 (Originally 10/01/2019)  . COLONOSCOPY  12/25/2020 (Originally 05/02/2019)  . Hepatitis C Screening  12/25/2020 (Originally 10/31/1950)  . PNA vac Low Risk Adult (1 of 2 - PCV13) 12/25/2020 (Originally 05/08/2015)  . OPHTHALMOLOGY EXAM  03/02/2020  . FOOT EXAM  03/12/2020  . HEMOGLOBIN A1C  06/14/2020  . TETANUS/TDAP  02/26/2021  . MAMMOGRAM  10/18/2021  . DEXA SCAN  Completed  . COVID-19 Vaccine  Completed    Health Maintenance  There are no preventive care reminders to display for this patient.  Colorectal cancer screening: No longer required. pt declines Mammogram status: No longer required.  Pt declines      Additional Screening:  Hepatitis C Screening: Pt declines at this time  Vision Screening: Recommended annual ophthalmology exams for early detection of glaucoma and other disorders of the eye. Is the patient up to date with their annual eye exam?  Yes  Who is the provider or what is the name of the office in which the patient attends annual eye exams? Dr Groat   Dental Screening: Recommended annual  dental exams for proper oral hygiene  Community Resource Referral / Chronic Care Management: CRR required this visit?  No   CCM required this visit?  No      Plan:     I have personally reviewed and noted the following in the patient's chart:   . Medical and social history . Use of alcohol, tobacco or illicit drugs  . Current medications and supplements . Functional ability and status . Nutritional status . Physical activity . Advanced directives . List of other physicians . Hospitalizations, surgeries, and ER visits in previous 12 months . Vitals . Screenings to include cognitive, depression, and falls . Referrals and appointments  In addition, I have reviewed and discussed with patient certain preventive protocols, quality metrics, and best practice recommendations. A written personalized care plan for preventive services as well as general preventive health recommendations were provided to patient.     Tina H Betterson, LPN   12/26/2019   Nurse Notes: Pt had concerns about taking an Aranesp injection and stated she will make an appt to speak to Dr Panosh before allowing administration to find out further information.      

## 2020-01-08 NOTE — Progress Notes (Signed)
Chief Complaint  Patient presents with  . Medication Management    wants to discuss medication that can possibly take in future    HPI: Alexandra [name] 69 y.o. come in for Chronic disease management    BP : Ok at home  Would like to decrease medication not sure it is that effective and is ok otherwise  DM :controlled  Thrombocytosis:   Under med c and recent inc anemia  And  E po meds considered  Denies cp sob  Bleeding  Has concern about se issues possible after readings  Pharmacy info   ROS: See pertinent positives and negatives per HPI.  Past Medical History:  Diagnosis Date  . Diabetes mellitus without complication ([place])   . 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 Goals of care, counseling/discussion 12/15/2019  . Hypertension    echo   . Iron deficiency anemia, unspecified 03/15/2013  . 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 Thrombocytosis     Family History  Problem Relation Age of Onset  . Heart disease Mother        died age 69  . Kidney disease Mother        family hx  . Heart attack Father 87       massive  . Hypertension Sister   . Hypertension Sister   . Hypertension Sister   . Hypertension Sister   . Arthritis Other        family hx  . Diabetes Other        family hx  . Stroke Other         dialysis    Social History   Socioeconomic History  . Marital status: Married    Spouse name: Not on file  . Number of children: Not on file  . Years of education: Not on file  . Highest education level: Not on file  Occupational History  . Occupation: retired  Tobacco Use  . Smoking status: Never Smoker  . Smokeless tobacco: Never Used  . Tobacco comment: never used tobacco  Vaping Use  . Vaping Use: Never used  Substance and Sexual Activity  . Alcohol use: No    Alcohol/week: 0.0 standard drinks  . Drug use: No  . Sexual activity: Not on file  Other Topics Concern  . 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   No tobacco no ethoh little caffiene .    Social Determinants of Health   Financial Resource Strain: Low Risk   . Difficulty of Paying Living Expenses: Not hard at all  Food Insecurity: No Food Insecurity  . Worried About Charity fundraiser in the Last Year: Never true  . Ran Out of Food in the Last Year: Never true  Transportation Needs: No Transportation Needs  . Lack of Transportation (Medical): No  . Lack of Transportation (Non-Medical): No  Physical Activity: Insufficiently Active  . Days of Exercise per Week: 3 days  . Minutes of Exercise per Session: 30 min  Stress: No Stress Concern Present  . Feeling of Stress : Not at all  Social Connections: Moderately Integrated  . Frequency of Communication with Friends and Family: Twice a week  . Frequency of Social Gatherings with Friends and Family: Once a week  . Attends Religious Services: More than 4 times per year  . Active  Member of Clubs or Organizations: No  . Attends Archivist Meetings: Never  . Marital Status: Married    Outpatient Medications Prior to Visit  Medication Sig Dispense Refill  . amLODipine (NORVASC) 10 MG tablet TAKE 1 TABLET BY MOUTH ONCE DAILY 90 tablet 3  . anagrelide (AGRYLIN) 1 MG capsule TAKE 5 CAPSULES BY MOUTH DAILY 450 capsule 3  . aspirin 81 MG tablet Take 81 mg by mouth daily.    . Fe Fum-FePoly-Vit C-Vit B3 (INTEGRA) 62.5-62.5-40-3 MG CAPS TAKE 1 CAPSULE BY MOUTH DAILY 30 capsule 8  . folic acid (FOLVITE) 1 MG tablet Take 2 tablets by mouth once daily 60 tablet 0  . glucose blood (ONE TOUCH ULTRA TEST) test strip Use to test blood sugar 1 times daily. **PT NEEDS FOLLOW UP APPT FOR FURTHER REFILLS** 100 each 0  . lisinopril (ZESTRIL) 20 MG tablet TAKE 2 TABLETS BY MOUTH ONCE DAILY 180 tablet 3  . Nebivolol HCl (BYSTOLIC) 20 MG TABS Take 1 tablet (20 mg total) by mouth daily. 90 tablet 2  . ONETOUCH DELICA LANCETS 22L MISC Use  to test blood sugar 1 time daily. **PT NEEDS FOLLOW UP APPT FOR FURTHER REFILLS** 100 each 1  . Vitamin D, Ergocalciferol, (DRISDOL) 1.25 MG (50000 UT) CAPS capsule TAKE 1 CAPSULE BY MOUTH 1 TIME A WEEK 12 capsule 2   No facility-administered medications prior to visit.     EXAM:  BP (!) 158/80   Pulse 84   Temp 97.7 F (36.5 C) (Oral)   Ht 5\' 11"  (1.803 m)   Wt 130 lb (59 kg)   SpO2 99%   BMI 18.13 kg/m   Body mass index is 18.13 kg/m.  GENERAL: vitals reviewed and listed above, alert, oriented, appears well hydrated and in no acute distress HEENT: atraumatic, conjunctiva  clear, no obvious abnormalities on inspection of external nose and ears OPmasked  NECK: no obvious masses on inspection palpation  LUNGS: clear to auscultation bilaterally, no wheezes, rales or rhonchi, good air movement CV: HRRR, no clubbing cyanosis or  peripheral edema nl cap refill  MS: moves all extremities without noticeable focal  abnormality PSYCH: pleasant and cooperative, no obvious depression or anxiety Lab Results  Component Value Date   WBC 5.7 12/15/2019   HGB 7.5 (L) 12/15/2019   HCT 23.8 (L) 12/15/2019   PLT 325 12/15/2019   GLUCOSE 101 (H) 12/15/2019   CHOL 229 (H) 10/18/2017   TRIG 74.0 10/18/2017   HDL 57.40 10/18/2017   LDLDIRECT 127.3 04/19/2012   LDLCALC 157 (H) 10/18/2017   ALT 12 12/15/2019   AST 15 12/15/2019   NA 139 12/15/2019   K 3.8 12/15/2019   CL 106 12/15/2019   CREATININE 1.16 (H) 12/15/2019   BUN 31 (H) 12/15/2019   CO2 26 12/15/2019   TSH 1.62 05/14/2016   INR 1.1 05/13/2018   HGBA1C 5.9 (H) 12/15/2019   MICROALBUR 1.8 05/14/2016   BP Readings from Last 3 Encounters:  01/09/20 (!) 158/80  12/15/19 137/80  10/18/19 139/75   Wt Readings from Last 3 Encounters:  01/09/20 130 lb (59 kg)  12/15/19 127 lb (57.6 kg)  10/18/19 129 lb 12 oz (58.9 kg)     ASSESSMENT AND PLAN:  Discussed the following assessment and plan:  Essential  hypertension  Medication management  Controlled type 2 diabetes mellitus without complication, without long-term current use of insulin (HCC)  Myeloproliferative neoplasm (HCC)  Erythropoietin deficiency anemia  MS Wendling is wishing to dec  bp medication  Not sure will help  She ask  a about bystolic. She can try thiis  To help compliance but  Not sure would be helpful.  She will continue lifestyle.  Disc  A while about injectable Erythro  And potential side effects   Get lipid panel with dr Marin Olp .  Helped lcarified   ?s to disc w   dr Marin Olp  -Patient advised to return or notify health care team  if  new concerns arise. Risk benefit med discussed  40  Minutes review  And counsel   Patient Instructions   Disc some more with dr E about the  Medication and anemia plan.  Other  opinions   Will ask  Dr Marin Olp to get a lipid panel with next blood test.   Get some more bp readings when not here.   Will get cardiology  eval for  Assessment as discussed.   shingrix vaccine at the pharmacy   As well as Td booster .   Lab Results  Component Value Date   WBC 5.7 12/15/2019   HGB 7.5 (L) 12/15/2019   HCT 23.8 (L) 12/15/2019   PLT 325 12/15/2019   GLUCOSE 101 (H) 12/15/2019   CHOL 229 (H) 10/18/2017   TRIG 74.0 10/18/2017   HDL 57.40 10/18/2017   LDLDIRECT 127.3 04/19/2012   LDLCALC 157 (H) 10/18/2017   ALT 12 12/15/2019   AST 15 12/15/2019   NA 139 12/15/2019   K 3.8 12/15/2019   CL 106 12/15/2019   CREATININE 1.16 (H) 12/15/2019   BUN 31 (H) 12/15/2019   CO2 26 12/15/2019   TSH 1.62 05/14/2016   INR 1.1 05/13/2018   HGBA1C 5.9 (H) 12/15/2019   MICROALBUR 1.8 05/14/2016         Ahnyla Mendel K. Gwendolyn Nishi M.D.

## 2020-01-09 ENCOUNTER — Other Ambulatory Visit: Payer: Self-pay

## 2020-01-09 ENCOUNTER — Ambulatory Visit (INDEPENDENT_AMBULATORY_CARE_PROVIDER_SITE_OTHER): Payer: Medicare PPO | Admitting: Internal Medicine

## 2020-01-09 ENCOUNTER — Encounter: Payer: Self-pay | Admitting: Internal Medicine

## 2020-01-09 VITALS — BP 158/80 | HR 84 | Temp 97.7°F | Ht 71.0 in | Wt 130.0 lb

## 2020-01-09 DIAGNOSIS — E119 Type 2 diabetes mellitus without complications: Secondary | ICD-10-CM

## 2020-01-09 DIAGNOSIS — I10 Essential (primary) hypertension: Secondary | ICD-10-CM

## 2020-01-09 DIAGNOSIS — Z0000 Encounter for general adult medical examination without abnormal findings: Secondary | ICD-10-CM

## 2020-01-09 DIAGNOSIS — D631 Anemia in chronic kidney disease: Secondary | ICD-10-CM | POA: Diagnosis not present

## 2020-01-09 DIAGNOSIS — D471 Chronic myeloproliferative disease: Secondary | ICD-10-CM

## 2020-01-09 DIAGNOSIS — Z79899 Other long term (current) drug therapy: Secondary | ICD-10-CM | POA: Diagnosis not present

## 2020-01-09 NOTE — Patient Instructions (Addendum)
Disc some more with dr E about the  Medication and anemia plan.  Other  opinions   Will ask  Dr Marin Olp to get a lipid panel with next blood test.   Get some more bp readings when not here.   Will get cardiology  eval for  Assessment as discussed.   shingrix vaccine at the pharmacy   As well as Td booster .   Lab Results  Component Value Date   WBC 5.7 12/15/2019   HGB 7.5 (L) 12/15/2019   HCT 23.8 (L) 12/15/2019   PLT 325 12/15/2019   GLUCOSE 101 (H) 12/15/2019   CHOL 229 (H) 10/18/2017   TRIG 74.0 10/18/2017   HDL 57.40 10/18/2017   LDLDIRECT 127.3 04/19/2012   LDLCALC 157 (H) 10/18/2017   ALT 12 12/15/2019   AST 15 12/15/2019   NA 139 12/15/2019   K 3.8 12/15/2019   CL 106 12/15/2019   CREATININE 1.16 (H) 12/15/2019   BUN 31 (H) 12/15/2019   CO2 26 12/15/2019   TSH 1.62 05/14/2016   INR 1.1 05/13/2018   HGBA1C 5.9 (H) 12/15/2019   MICROALBUR 1.8 05/14/2016

## 2020-01-10 ENCOUNTER — Telehealth: Payer: Self-pay | Admitting: *Deleted

## 2020-01-10 NOTE — Telephone Encounter (Signed)
Message received from patient stating she has researched Aranesp and does not feel as though she would like to have it at this time.  Pt states that she will come in on Friday, 01/12/20 for labs and to see Dr. Marin Olp, but does not want injection.  Dr. Marin Olp notified.

## 2020-01-11 NOTE — Progress Notes (Signed)
Date is wrong  37 10

## 2020-01-12 ENCOUNTER — Encounter: Payer: Self-pay | Admitting: Hematology & Oncology

## 2020-01-12 ENCOUNTER — Telehealth: Payer: Self-pay

## 2020-01-12 ENCOUNTER — Inpatient Hospital Stay: Payer: Medicare PPO | Attending: Hematology & Oncology | Admitting: Hematology & Oncology

## 2020-01-12 ENCOUNTER — Inpatient Hospital Stay: Payer: Medicare PPO

## 2020-01-12 ENCOUNTER — Other Ambulatory Visit: Payer: Self-pay

## 2020-01-12 VITALS — BP 149/80 | HR 87 | Temp 98.0°F | Resp 16 | Wt 127.0 lb

## 2020-01-12 DIAGNOSIS — D471 Chronic myeloproliferative disease: Secondary | ICD-10-CM

## 2020-01-12 DIAGNOSIS — N189 Chronic kidney disease, unspecified: Secondary | ICD-10-CM | POA: Diagnosis not present

## 2020-01-12 DIAGNOSIS — D631 Anemia in chronic kidney disease: Secondary | ICD-10-CM

## 2020-01-12 DIAGNOSIS — D473 Essential (hemorrhagic) thrombocythemia: Secondary | ICD-10-CM | POA: Diagnosis not present

## 2020-01-12 DIAGNOSIS — Z7189 Other specified counseling: Secondary | ICD-10-CM

## 2020-01-12 LAB — CBC WITH DIFFERENTIAL (CANCER CENTER ONLY)
Abs Immature Granulocytes: 0.08 10*3/uL — ABNORMAL HIGH (ref 0.00–0.07)
Basophils Absolute: 0.1 10*3/uL (ref 0.0–0.1)
Basophils Relative: 1 %
Eosinophils Absolute: 0.1 10*3/uL (ref 0.0–0.5)
Eosinophils Relative: 2 %
HCT: 26.1 % — ABNORMAL LOW (ref 36.0–46.0)
Hemoglobin: 8 g/dL — ABNORMAL LOW (ref 12.0–15.0)
Immature Granulocytes: 1 %
Lymphocytes Relative: 24 %
Lymphs Abs: 1.6 10*3/uL (ref 0.7–4.0)
MCH: 25.2 pg — ABNORMAL LOW (ref 26.0–34.0)
MCHC: 30.7 g/dL (ref 30.0–36.0)
MCV: 82.1 fL (ref 80.0–100.0)
Monocytes Absolute: 0.8 10*3/uL (ref 0.1–1.0)
Monocytes Relative: 12 %
Neutro Abs: 4.1 10*3/uL (ref 1.7–7.7)
Neutrophils Relative %: 60 %
Platelet Count: 330 10*3/uL (ref 150–400)
RBC: 3.18 MIL/uL — ABNORMAL LOW (ref 3.87–5.11)
RDW: 19 % — ABNORMAL HIGH (ref 11.5–15.5)
WBC Count: 6.7 10*3/uL (ref 4.0–10.5)
nRBC: 0 % (ref 0.0–0.2)

## 2020-01-12 LAB — CMP (CANCER CENTER ONLY)
ALT: 10 U/L (ref 0–44)
AST: 13 U/L — ABNORMAL LOW (ref 15–41)
Albumin: 4.2 g/dL (ref 3.5–5.0)
Alkaline Phosphatase: 90 U/L (ref 38–126)
Anion gap: 7 (ref 5–15)
BUN: 29 mg/dL — ABNORMAL HIGH (ref 8–23)
CO2: 28 mmol/L (ref 22–32)
Calcium: 9.5 mg/dL (ref 8.9–10.3)
Chloride: 106 mmol/L (ref 98–111)
Creatinine: 1.06 mg/dL — ABNORMAL HIGH (ref 0.44–1.00)
GFR, Estimated: 57 mL/min — ABNORMAL LOW (ref >60)
Glucose, Bld: 102 mg/dL — ABNORMAL HIGH (ref 70–99)
Potassium: 3.9 mmol/L (ref 3.5–5.1)
Sodium: 141 mmol/L (ref 135–145)
Total Bilirubin: 0.6 mg/dL (ref 0.3–1.2)
Total Protein: 7.4 g/dL (ref 6.5–8.1)

## 2020-01-12 LAB — RETICULOCYTES
Immature Retic Fract: 17.2 % — ABNORMAL HIGH (ref 2.3–15.9)
RBC.: 3.14 MIL/uL — ABNORMAL LOW (ref 3.87–5.11)
Retic Count, Absolute: 32.7 10*3/uL (ref 19.0–186.0)
Retic Ct Pct: 1 % (ref 0.4–3.1)

## 2020-01-12 LAB — LACTATE DEHYDROGENASE: LDH: 196 U/L — ABNORMAL HIGH (ref 98–192)

## 2020-01-12 LAB — SAVE SMEAR(SSMR), FOR PROVIDER SLIDE REVIEW

## 2020-01-12 NOTE — Telephone Encounter (Signed)
appts made and printed for pt per 01/12/20 LOS for 03/11/20.... AOM

## 2020-01-12 NOTE — Progress Notes (Signed)
[name]  [employer] is a [employer].  Hematology and Oncology Follow Up Visit  Alexandra [name] [id number] 01-18-1951 69 y.o. 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 is back for followup. She looks pretty good. Her platelet count continues to stabilize.  I am very happy about this.  Today, her platelet count was 330,000.  She would like to hold off on the Aranesp for right now.  She feels good.  She has been reading about side effects.  She has been really asymptomatic despite the significant anemia.  She knows that she [place] started in the future if her hemoglobin goes down.  She has a good appetite.  She has had no nausea or vomiting.  She has had no cough.  She has had no rashes.  There is been no change in bowel or bladder habits.  Only last saw her in October, her ferritin was 289 with an iron saturation of 30%.  She has had no issues with headache.  She has had no leg swelling.  Overall, I would have to say that her performance status is ECOG 1.    Medications:  Current Outpatient Medications:  .  amLODipine (NORVASC) 10 MG tablet, TAKE 1 TABLET BY MOUTH ONCE DAILY, Disp: 90 tablet, Rfl: 3 .  anagrelide (AGRYLIN) 1 MG capsule, TAKE 5 CAPSULES BY MOUTH DAILY, Disp: 450 capsule, Rfl: 3 .  aspirin 81 MG tablet, Take 81 mg by mouth daily., Disp: , Rfl:  .  Fe Fum-FePoly-Vit C-Vit B3 (INTEGRA) 62.5-62.5-40-3 MG CAPS, TAKE 1 CAPSULE BY MOUTH DAILY, Disp: 30 capsule, Rfl: 8 .  folic acid (FOLVITE) 1 MG tablet, Take 2 tablets by mouth once daily, Disp: 60 tablet, Rfl: 0 .  glucose blood (ONE TOUCH ULTRA TEST) test strip, Use to test blood sugar 1 times daily. **PT NEEDS FOLLOW UP  APPT FOR FURTHER REFILLS**, Disp: 100 each, Rfl: 0 .  lisinopril (ZESTRIL) 20 MG tablet, TAKE 2 TABLETS BY MOUTH ONCE DAILY, Disp: 180 tablet, Rfl: 3 .  Nebivolol HCl (BYSTOLIC) 20 MG TABS, Take 1 tablet (20 mg total) by mouth daily., Disp: 90 tablet, Rfl: 2 .  ONETOUCH DELICA LANCETS 41P MISC, Use to test blood sugar 1 time daily. **PT NEEDS FOLLOW UP APPT FOR FURTHER REFILLS**, Disp: 100 each, Rfl: 1 .  Vitamin D, Ergocalciferol, (DRISDOL) 1.25 MG (50000 UT) CAPS capsule, TAKE 1 CAPSULE BY MOUTH 1 TIME A WEEK, Disp: 12 capsule, Rfl: 2  Allergies: No Known Allergies  Past Medical History, Surgical history, Social history, and Family History were reviewed and updated.  Review of Systems: Review of Systems  All other systems reviewed and are negative. Marland Kitchen  Physical Exam:  weight is 127 lb (57.6 kg). Her oral temperature is 98 F (36.7 C). Her blood pressure is 149/80 (abnormal) and her pulse is 87. Her respiration is 16 and oxygen saturation is 100%. =jh  Physical Exam Vitals reviewed.  HENT:     Head: Normocephalic and atraumatic.  Eyes:     Pupils: Pupils are equal, round, and reactive to light.  Cardiovascular:     Rate and Rhythm: Normal rate  and regular rhythm.     Heart sounds: Normal heart sounds.  Pulmonary:     Effort: Pulmonary effort is normal.     Breath sounds: Normal breath sounds.  Abdominal:     General: Bowel sounds are normal.     Palpations: Abdomen is soft.  Musculoskeletal:        General: No tenderness or deformity. Normal range of motion.     Cervical back: Normal range of motion.  Lymphadenopathy:     Cervical: No cervical adenopathy.  Skin:    General: Skin is warm and dry.     Findings: No erythema or rash.  Neurological:     Mental Status: She is alert and oriented to person, place, and time.  Psychiatric:        Behavior: Behavior normal.        Thought Content: Thought content normal.        Judgment: Judgment normal.      Lab Results   Component Value Date   WBC 6.7 01/12/2020   HGB 8.0 (L) 01/12/2020   HCT 26.1 (L) 01/12/2020   MCV 82.1 01/12/2020   PLT 330 01/12/2020     Chemistry      Component Value Date/Time   NA 141 01/12/2020 1107   NA 144 02/02/2017 1158   NA 140 07/24/2016 1007   K 3.9 01/12/2020 1107   K 3.7 02/02/2017 1158   K 3.6 07/24/2016 1007   CL 106 01/12/2020 1107   CL 103 02/02/2017 1158   CO2 28 01/12/2020 1107   CO2 29 02/02/2017 1158   CO2 26 07/24/2016 1007   BUN 29 (H) 01/12/2020 1107   BUN 20 02/02/2017 1158   BUN 30.3 (H) 07/24/2016 1007   CREATININE 1.06 (H) 01/12/2020 1107   CREATININE 0.9 02/02/2017 1158   CREATININE 1.1 07/24/2016 1007   GLU 129 02/02/2017 0000      Component Value Date/Time   CALCIUM 9.5 01/12/2020 1107   CALCIUM 9.0 02/02/2017 1158   CALCIUM 9.3 07/24/2016 1007   ALKPHOS 90 01/12/2020 1107   ALKPHOS 110 (H) 02/02/2017 1158   ALKPHOS 104 07/24/2016 1007   AST 13 (L) 01/12/2020 1107   AST 13 07/24/2016 1007   ALT 10 01/12/2020 1107   ALT 26 02/02/2017 1158   ALT 15 07/24/2016 1007   BILITOT 0.6 01/12/2020 1107   BILITOT 0.55 07/24/2016 1007      Impression and Plan: Alexandra [name] is 69 year old [name]. She has essential thrombocythemia. She actually is Calreticulin positive.  I am very encouraged by the fact that her platelet count is still relatively normal. I am happy that her hemoglobin has not gone down anymore.  It is clear that her body has acclimated to the fact that she is anemic.  She been anemic for quite a while.  It really has not affected her bodily functions nor her cardiopulmonary status.  At this point, I think we can now try to get her through the holiday season.  I think she would be okay to get through the holidays until January.  She was knows that she can give [place] a call if she has any problems.     [name], MD 11/12/202112:42 PM

## 2020-01-15 LAB — FERRITIN: Ferritin: 272 ng/mL (ref 11–307)

## 2020-01-15 LAB — IRON AND TIBC
Iron: 72 ug/dL (ref 41–142)
Saturation Ratios: 31 % (ref 21–57)
TIBC: 231 ug/dL — ABNORMAL LOW (ref 236–444)
UIBC: 159 ug/dL (ref 120–384)

## 2020-01-24 ENCOUNTER — Other Ambulatory Visit: Payer: Self-pay | Admitting: Family

## 2020-01-24 DIAGNOSIS — D473 Essential (hemorrhagic) thrombocythemia: Secondary | ICD-10-CM

## 2020-01-24 DIAGNOSIS — D508 Other iron deficiency anemias: Secondary | ICD-10-CM

## 2020-02-07 ENCOUNTER — Telehealth: Payer: Self-pay | Admitting: Internal Medicine

## 2020-02-07 NOTE — Telephone Encounter (Signed)
Pt is calling in stating that she is needing a Fast Health Assessment Form filled out by her provider (Dr. Regis Bill) and she will be dropping the form off today (02/07/2020) and she understands that her provider is out of the office and will be returning on the 21st of this month.  Pt is also in need of a TB skin test and would like to get it next week if possible.

## 2020-02-12 NOTE — Telephone Encounter (Signed)
Okay for TB Test?

## 2020-02-16 ENCOUNTER — Encounter: Payer: Self-pay | Admitting: Internal Medicine

## 2020-02-20 NOTE — Telephone Encounter (Signed)
LMVM for the patient to contact the office to schedule next for a PPD TB skin test or schedule this week for quantiferon lab text.

## 2020-02-20 NOTE — Telephone Encounter (Signed)
Ok to do TB test    Either ppd  Or she can do the quantiferon   Gold blood test

## 2020-02-20 NOTE — Telephone Encounter (Signed)
Okay to schedule TB test

## 2020-02-21 DIAGNOSIS — Z0279 Encounter for issue of other medical certificate: Secondary | ICD-10-CM

## 2020-02-21 NOTE — Telephone Encounter (Signed)
Form completed and signed Patient can pick it up when she has her TB test read. Placed on desk.  Completed folder

## 2020-02-26 ENCOUNTER — Other Ambulatory Visit: Payer: Self-pay | Admitting: Internal Medicine

## 2020-02-27 ENCOUNTER — Ambulatory Visit (INDEPENDENT_AMBULATORY_CARE_PROVIDER_SITE_OTHER): Payer: Medicare PPO | Admitting: *Deleted

## 2020-02-27 ENCOUNTER — Other Ambulatory Visit: Payer: Self-pay

## 2020-02-27 DIAGNOSIS — Z111 Encounter for screening for respiratory tuberculosis: Secondary | ICD-10-CM | POA: Diagnosis not present

## 2020-02-27 IMAGING — MG DIGITAL SCREENING BILATERAL MAMMOGRAM WITH TOMO AND CAD
6 of 10 series · 6 of 30 positions shown · non-contrast
Comparison: Previous exam(s).

CLINICAL DATA: Screening.

EXAM:
DIGITAL SCREENING BILATERAL MAMMOGRAM WITH TOMO AND CAD

[R CC synth-2D]
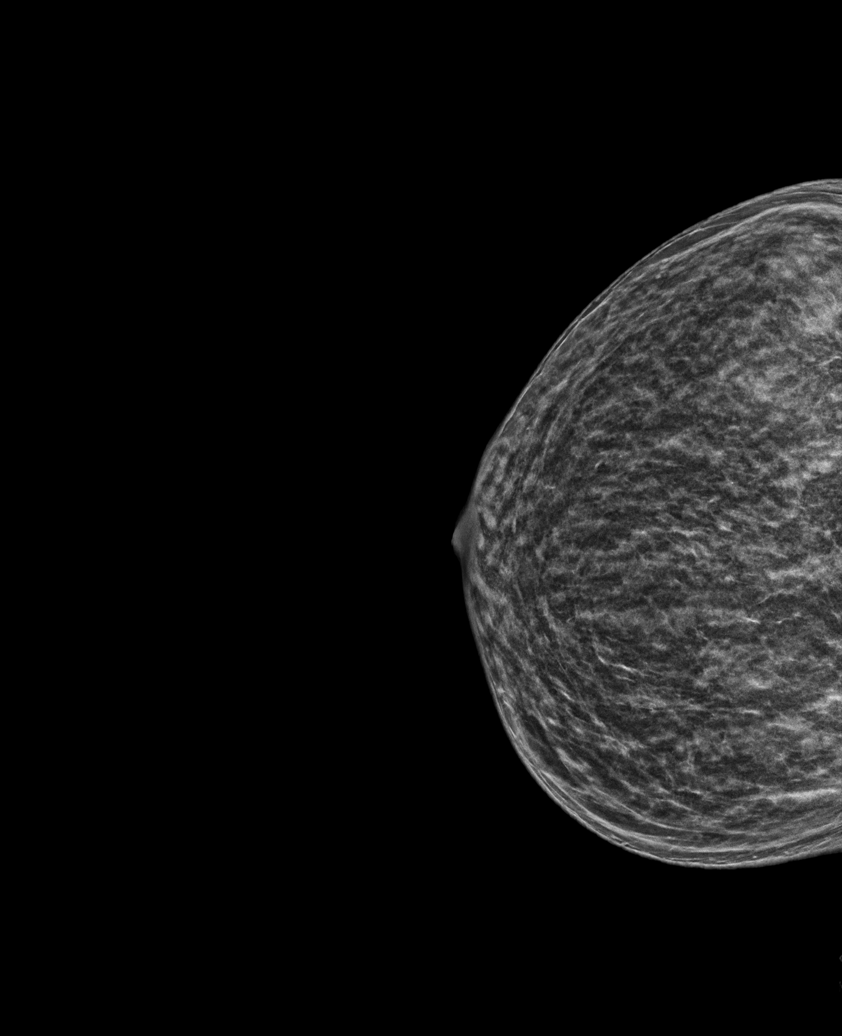

[L CC synth-2D (1 of 2)]
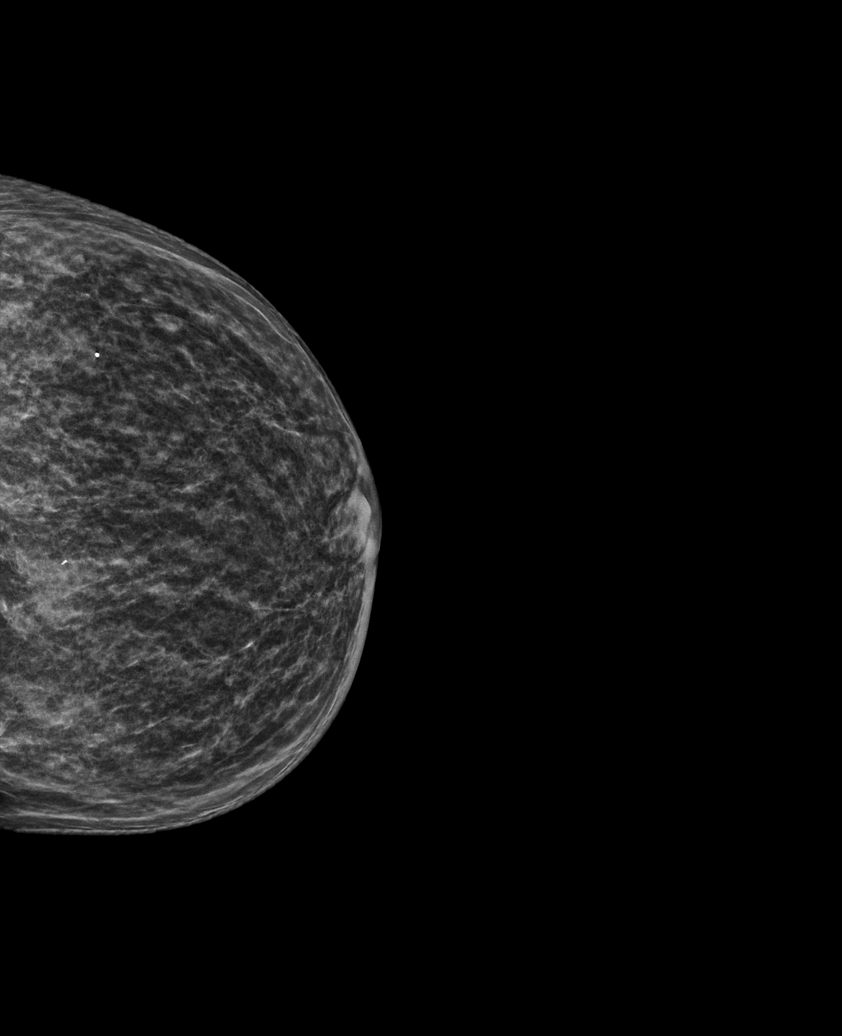

[L CC synth-2D (2 of 2)]
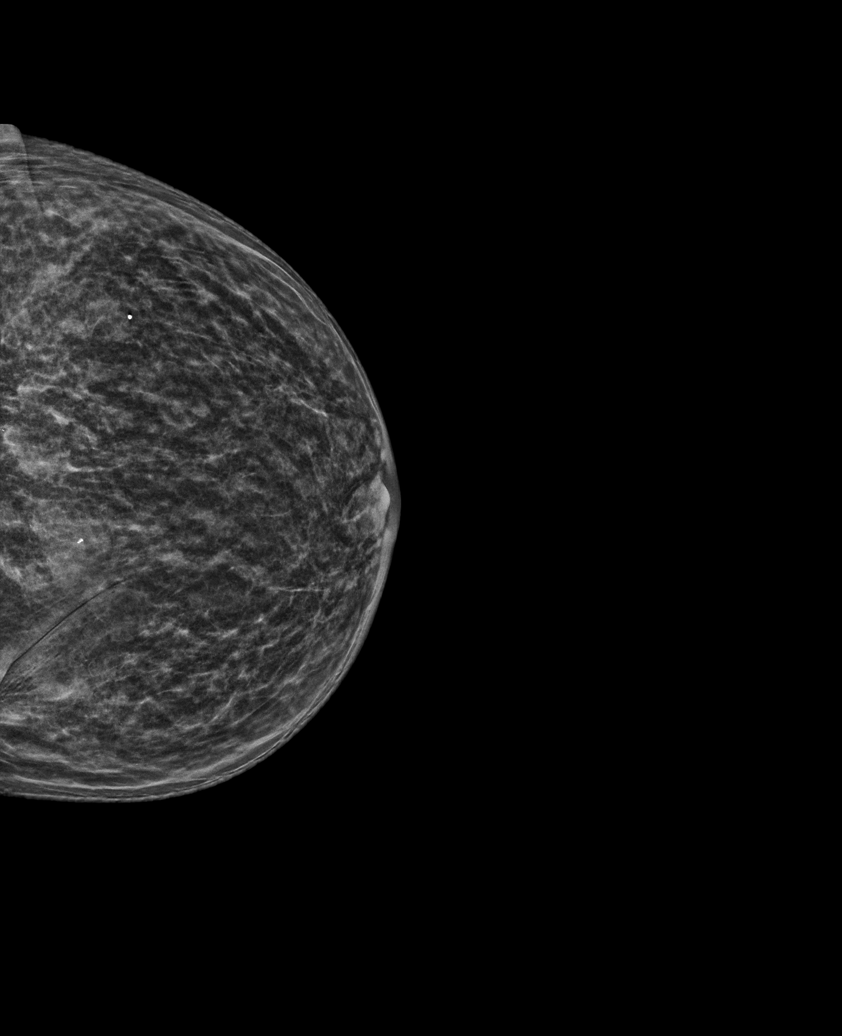

[R MLO synth-2D]
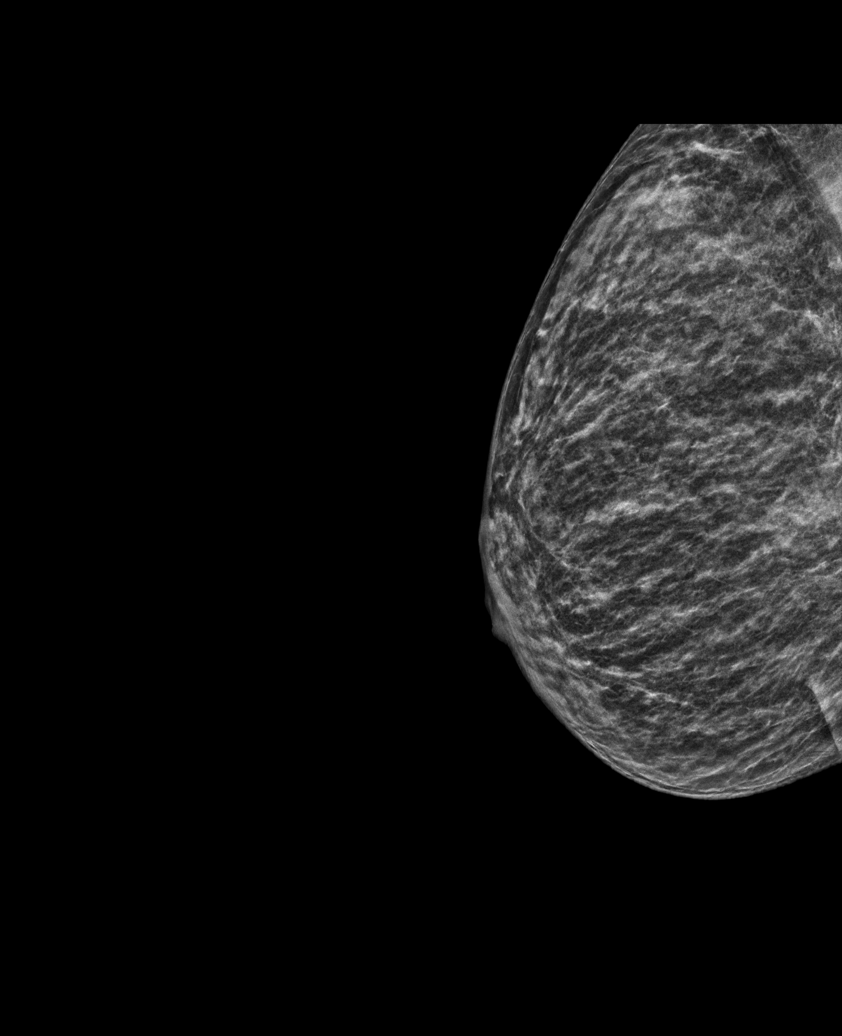

[L MLO synth-2D]
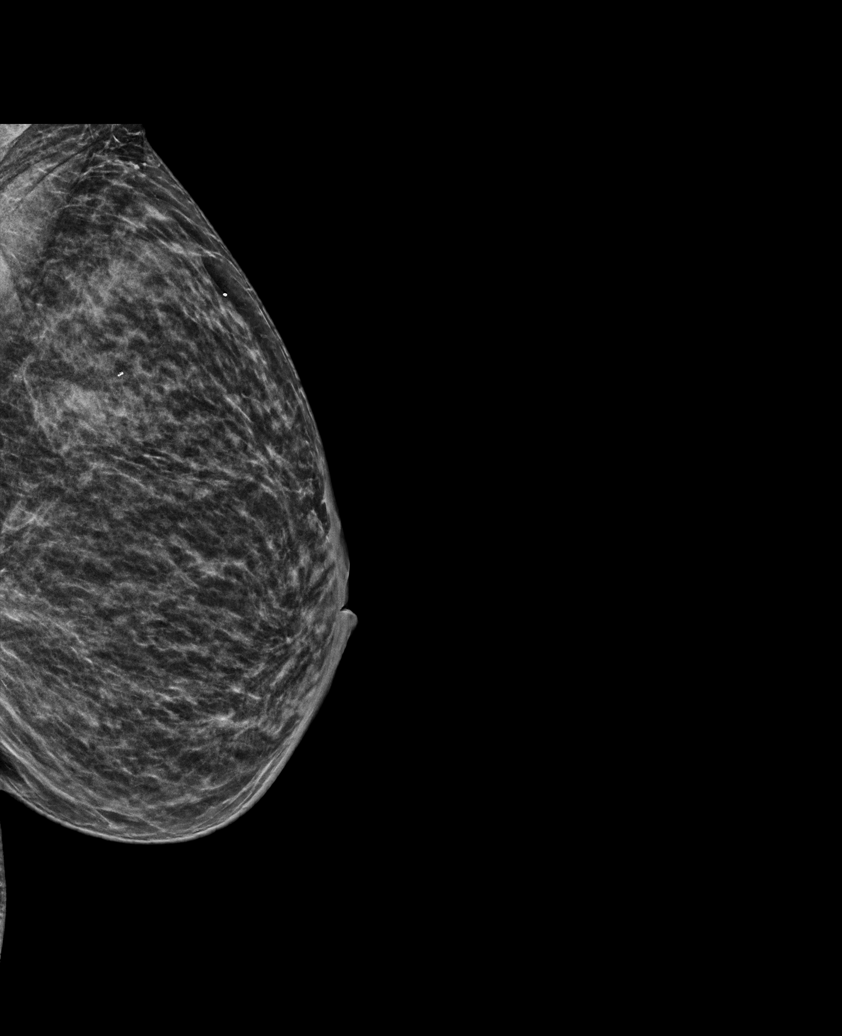

[R CC tomo · tomo slice 21/42.0]
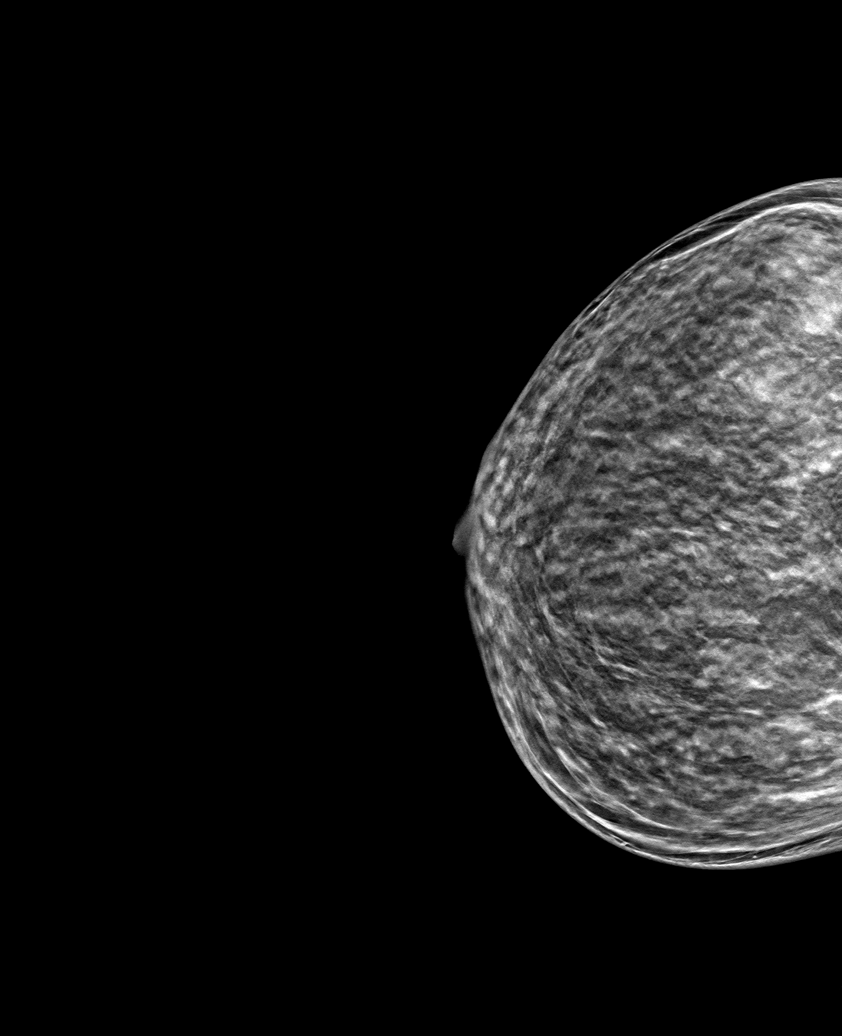

[6 of 30 positions shown; findings below may reference images not displayed]

ACR Breast Density Category c: The breast tissue is heterogeneously
dense, which may obscure small masses.
FINDINGS: There are no findings suspicious for malignancy. Images were
processed with CAD.
IMPRESSION: No mammographic evidence of malignancy. A result letter of this
screening mammogram will be mailed directly to the patient.

RECOMMENDATION:
Screening mammogram in one year. (Code:[id number])

BI-RADS CATEGORY  1: Negative.

## 2020-02-28 ENCOUNTER — Other Ambulatory Visit: Payer: Self-pay

## 2020-02-29 ENCOUNTER — Ambulatory Visit: Payer: Medicare PPO | Admitting: *Deleted

## 2020-02-29 ENCOUNTER — Other Ambulatory Visit: Payer: Self-pay

## 2020-02-29 DIAGNOSIS — Z111 Encounter for screening for respiratory tuberculosis: Secondary | ICD-10-CM

## 2020-02-29 LAB — TB SKIN TEST
Induration: 0 mm
TB Skin Test: NEGATIVE

## 2020-02-29 NOTE — Progress Notes (Signed)
TB skin test reading performed.

## 2020-03-04 ENCOUNTER — Other Ambulatory Visit: Payer: Self-pay | Admitting: *Deleted

## 2020-03-04 DIAGNOSIS — E559 Vitamin D deficiency, unspecified: Secondary | ICD-10-CM

## 2020-03-04 MED ORDER — VITAMIN D (ERGOCALCIFEROL) 1.25 MG (50000 UNIT) PO CAPS: ORAL_CAPSULE | ORAL | 2 refills | Status: DC

## 2020-03-06 ENCOUNTER — Other Ambulatory Visit: Payer: Self-pay | Admitting: Family

## 2020-03-06 DIAGNOSIS — D508 Other iron deficiency anemias: Secondary | ICD-10-CM

## 2020-03-06 DIAGNOSIS — D473 Essential (hemorrhagic) thrombocythemia: Secondary | ICD-10-CM

## 2020-03-11 ENCOUNTER — Other Ambulatory Visit: Payer: Self-pay

## 2020-03-11 ENCOUNTER — Inpatient Hospital Stay (HOSPITAL_BASED_OUTPATIENT_CLINIC_OR_DEPARTMENT_OTHER): Payer: Medicare PPO | Admitting: Hematology & Oncology

## 2020-03-11 ENCOUNTER — Inpatient Hospital Stay: Payer: Medicare PPO | Attending: Hematology & Oncology

## 2020-03-11 ENCOUNTER — Other Ambulatory Visit: Payer: Self-pay | Admitting: Internal Medicine

## 2020-03-11 ENCOUNTER — Encounter: Payer: Self-pay | Admitting: Hematology & Oncology

## 2020-03-11 ENCOUNTER — Telehealth: Payer: Self-pay | Admitting: Hematology & Oncology

## 2020-03-11 VITALS — BP 136/81 | HR 78 | Temp 97.7°F | Resp 18 | Wt 126.0 lb

## 2020-03-11 DIAGNOSIS — D473 Essential (hemorrhagic) thrombocythemia: Secondary | ICD-10-CM | POA: Insufficient documentation

## 2020-03-11 DIAGNOSIS — N189 Chronic kidney disease, unspecified: Secondary | ICD-10-CM

## 2020-03-11 DIAGNOSIS — D631 Anemia in chronic kidney disease: Secondary | ICD-10-CM

## 2020-03-11 DIAGNOSIS — Z7982 Long term (current) use of aspirin: Secondary | ICD-10-CM | POA: Insufficient documentation

## 2020-03-11 DIAGNOSIS — Z79899 Other long term (current) drug therapy: Secondary | ICD-10-CM | POA: Insufficient documentation

## 2020-03-11 LAB — CMP (CANCER CENTER ONLY)
ALT: 11 U/L (ref 0–44)
AST: 16 U/L (ref 15–41)
Albumin: 4 g/dL (ref 3.5–5.0)
Alkaline Phosphatase: 74 U/L (ref 38–126)
Anion gap: 11 (ref 5–15)
BUN: 30 mg/dL — ABNORMAL HIGH (ref 8–23)
CO2: 26 mmol/L (ref 22–32)
Calcium: 9.1 mg/dL (ref 8.9–10.3)
Chloride: 103 mmol/L (ref 98–111)
Creatinine: 1.03 mg/dL — ABNORMAL HIGH (ref 0.44–1.00)
GFR, Estimated: 59 mL/min — ABNORMAL LOW (ref >60)
Glucose, Bld: 121 mg/dL — ABNORMAL HIGH (ref 70–99)
Potassium: 3.4 mmol/L — ABNORMAL LOW (ref 3.5–5.1)
Sodium: 140 mmol/L (ref 135–145)
Total Bilirubin: 0.9 mg/dL (ref 0.3–1.2)
Total Protein: 7.2 g/dL (ref 6.5–8.1)

## 2020-03-11 LAB — CBC WITH DIFFERENTIAL (CANCER CENTER ONLY)
Abs Immature Granulocytes: 0.05 10*3/uL (ref 0.00–0.07)
Basophils Absolute: 0.1 10*3/uL (ref 0.0–0.1)
Basophils Relative: 1 %
Eosinophils Absolute: 0.2 10*3/uL (ref 0.0–0.5)
Eosinophils Relative: 3 %
HCT: 26.7 % — ABNORMAL LOW (ref 36.0–46.0)
Hemoglobin: 8.2 g/dL — ABNORMAL LOW (ref 12.0–15.0)
Immature Granulocytes: 1 %
Lymphocytes Relative: 25 %
Lymphs Abs: 1.7 10*3/uL (ref 0.7–4.0)
MCH: 24.9 pg — ABNORMAL LOW (ref 26.0–34.0)
MCHC: 30.7 g/dL (ref 30.0–36.0)
MCV: 81.2 fL (ref 80.0–100.0)
Monocytes Absolute: 0.7 10*3/uL (ref 0.1–1.0)
Monocytes Relative: 10 %
Neutro Abs: 4.2 10*3/uL (ref 1.7–7.7)
Neutrophils Relative %: 60 %
Platelet Count: 407 10*3/uL — ABNORMAL HIGH (ref 150–400)
RBC: 3.29 MIL/uL — ABNORMAL LOW (ref 3.87–5.11)
RDW: 19.6 % — ABNORMAL HIGH (ref 11.5–15.5)
WBC Count: 6.9 10*3/uL (ref 4.0–10.5)
nRBC: 0 % (ref 0.0–0.2)

## 2020-03-11 LAB — RETICULOCYTES
Immature Retic Fract: 15.7 % (ref 2.3–15.9)
RBC.: 3.3 MIL/uL — ABNORMAL LOW (ref 3.87–5.11)
Retic Count, Absolute: 31.4 10*3/uL (ref 19.0–186.0)
Retic Ct Pct: 1 % (ref 0.4–3.1)

## 2020-03-11 LAB — SAVE SMEAR(SSMR), FOR PROVIDER SLIDE REVIEW

## 2020-03-11 LAB — LACTATE DEHYDROGENASE: LDH: 219 U/L — ABNORMAL HIGH (ref 98–192)

## 2020-03-11 NOTE — Telephone Encounter (Signed)
Appointments scheduled patient has My chart for updates per 1/10

## 2020-03-11 NOTE — Progress Notes (Signed)
Plan Alexandra [name]  [employer] is [employer].  Hematology and Oncology Follow Up Visit  Alexandra [name] [id number] 1950/07/03 70 y.o. 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 is back for followup.  She enjoyed the holidays.  She got through the holidays without any difficulties.  She has been very conscientious about the coronavirus.  She has been very proactive with respect to keeping her self safe.  She is doing well on the anagrelide.  She has had no problems with anagrelide.  There have been no palpitations.  She has had no swelling.  She has had no sweats.  There has been no problems with rashes.  She has had no change in bowel or bladder habits.  There has been no cough.  She has had no headache.  She is staying active.  We have talked about using ESA on her.  She has a very low reticulocyte count.  As such, she probably would respond to ESA but she wishes to hold off on this for right now.    Everything seems to be going well with her family down in [place], [place].  She probably will go down there in the springtime.    Overall, I think that her performance status is ECOG 1.    Medications:  Current Outpatient Medications:  .  amLODipine (NORVASC) 10 MG tablet, TAKE 1 TABLET BY MOUTH EVERY DAY, Disp: 90 tablet, Rfl: 1 .  anagrelide (AGRYLIN) 1 MG capsule, TAKE 5 CAPSULES BY MOUTH DAILY, Disp: 450 capsule, Rfl: 3 .  aspirin 81 MG tablet, Take 81 mg by mouth daily., Disp: , Rfl:  .  Fe Fum-FePoly-Vit C-Vit B3 (INTEGRA) 62.5-62.5-40-3 MG CAPS, TAKE 1 CAPSULE BY MOUTH DAILY, Disp: 30 capsule, Rfl: 8 .  folic acid (FOLVITE) 1 MG tablet, Take 2 tablets by mouth once daily,  Disp: 60 tablet, Rfl: 0 .  glucose blood (ONE TOUCH ULTRA TEST) test strip, Use to test blood sugar 1 times daily. **PT NEEDS FOLLOW UP APPT FOR FURTHER REFILLS**, Disp: 100 each, Rfl: 0 .  lisinopril (ZESTRIL) 20 MG tablet, TAKE 2 TABLETS BY MOUTH EVERY DAY, Disp: 180 tablet, Rfl: 3 .  Nebivolol HCl (BYSTOLIC) 20 MG TABS, Take 1 tablet (20 mg total) by mouth daily., Disp: 90 tablet, Rfl: 2 .  ONETOUCH DELICA LANCETS 13Y MISC, Use to test blood sugar 1 time daily. **PT NEEDS FOLLOW UP APPT FOR FURTHER REFILLS**, Disp: 100 each, Rfl: 1 .  Vitamin D, Ergocalciferol, (DRISDOL) 1.25 MG (50000 UNIT) CAPS capsule, TAKE 1 CAPSULE BY MOUTH 1 TIME A WEEK, Disp: 12 capsule, Rfl: 2  Allergies: No Known Allergies  Past Medical History, Surgical history, Social history, and Family History were reviewed and updated.  Review of Systems: Review of Systems  All other systems reviewed and are negative. Marland Kitchen  Physical Exam:  weight is 126 lb (57.2 kg). Her oral temperature is 97.7 F (36.5 C). Her blood pressure is 136/81 and her pulse is 78. Her respiration is 18 and oxygen saturation is 100%. =jh  Physical Exam Vitals reviewed.  HENT:     Head: Normocephalic and atraumatic.  Eyes:     Pupils: Pupils are equal, round, and reactive to light.  Cardiovascular:     Rate and Rhythm: Normal rate and regular rhythm.     Heart sounds: Normal heart sounds.  Pulmonary:     Effort: Pulmonary effort is normal.     Breath sounds: Normal breath sounds.  Abdominal:     General: Bowel sounds are normal.     Palpations: Abdomen is soft.  Musculoskeletal:        General: No tenderness or deformity. Normal range of motion.     Cervical back: Normal range of motion.  Lymphadenopathy:     Cervical: No cervical adenopathy.  Skin:    General: Skin is warm and dry.     Findings: No erythema or rash.  Neurological:     Mental Status: She is alert and oriented to person, place, and time.  Psychiatric:         Behavior: Behavior normal.        Thought Content: Thought content normal.        Judgment: Judgment normal.      Lab Results  Component Value Date   WBC 6.9 03/11/2020   HGB 8.2 (L) 03/11/2020   HCT 26.7 (L) 03/11/2020   MCV 81.2 03/11/2020   PLT 407 (H) 03/11/2020     Chemistry      Component Value Date/Time   NA 141 01/12/2020 1107   NA 144 02/02/2017 1158   NA 140 07/24/2016 1007   K 3.9 01/12/2020 1107   K 3.7 02/02/2017 1158   K 3.6 07/24/2016 1007   CL 106 01/12/2020 1107   CL 103 02/02/2017 1158   CO2 28 01/12/2020 1107   CO2 29 02/02/2017 1158   CO2 26 07/24/2016 1007   BUN 29 (H) 01/12/2020 1107   BUN 20 02/02/2017 1158   BUN 30.3 (H) 07/24/2016 1007   CREATININE 1.06 (H) 01/12/2020 1107   CREATININE 0.9 02/02/2017 1158   CREATININE 1.1 07/24/2016 1007   GLU 129 02/02/2017 0000      Component Value Date/Time   CALCIUM 9.5 01/12/2020 1107   CALCIUM 9.0 02/02/2017 1158   CALCIUM 9.3 07/24/2016 1007   ALKPHOS 90 01/12/2020 1107   ALKPHOS 110 (H) 02/02/2017 1158   ALKPHOS 104 07/24/2016 1007   AST 13 (L) 01/12/2020 1107   AST 13 07/24/2016 1007   ALT 10 01/12/2020 1107   ALT 26 02/02/2017 1158   ALT 15 07/24/2016 1007   BILITOT 0.6 01/12/2020 1107   BILITOT 0.55 07/24/2016 1007      Impression and Plan: Alexandra [name] is 70 year old [name]. She has essential thrombocythemia. She actually is Calreticulin positive.  I am very encouraged by the fact that her platelet count is still relatively normal.  I looked at her peripheral blood smear.  I do not see anything that looked suspicious for any type of progression.  Again she has acclimated well to her anemia.  I do think that she would respond to Aranesp if we gave it to her.  She is still going to think about this.  We will now get her back in about 6 weeks.  I think this would be reasonable.  If her blood counts are still stable at 6 weeks, then we will go to 2 months follow-up.     [name], MD 1/10/202212:38 PM

## 2020-03-12 LAB — FERRITIN: Ferritin: 290 ng/mL (ref 11–307)

## 2020-03-12 LAB — IRON AND TIBC
Iron: 84 ug/dL (ref 41–142)
Saturation Ratios: 36 % (ref 21–57)
TIBC: 231 ug/dL — ABNORMAL LOW (ref 236–444)
UIBC: 147 ug/dL (ref 120–384)

## 2020-03-15 ENCOUNTER — Encounter: Payer: Medicare PPO | Admitting: Internal Medicine

## 2020-03-19 NOTE — Progress Notes (Signed)
Chief Complaint  Patient presents with  . Annual Exam    HPI: Alexandra [name] 70 y.o. comes in today for Preventive Medicare exam  Yearly Feels well ,colds a lot .  Bp seems ok  On meds  Sees Dr Myna Hidalgo for her calreticulim pos thrombocytosis and now anemia   fam hx of esrd mom  Not diabetic and sibling   Health Maintenance  Topic Date Due  . COVID-19 Vaccine (3 - Pfizer risk 4-dose series) 07/17/2019  . OPHTHALMOLOGY EXAM  03/02/2020  . FOOT EXAM  03/12/2020  . INFLUENZA VACCINE  05/30/2020 (Originally 10/01/2019)  . COLONOSCOPY (Pts 45-29yrs Insurance coverage will need to be confirmed)  12/25/2020 (Originally 05/02/2019)  . Hepatitis C Screening  12/25/2020 (Originally 1950/11/24)  . PNA vac Low Risk Adult (1 of 2 - PCV13) 12/25/2020 (Originally 05/08/2015)  . HEMOGLOBIN A1C  06/14/2020  . TETANUS/TDAP  02/26/2021  . MAMMOGRAM  10/18/2021  . 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 Last eye check UTD  Safety:  Has smoke detector and wears seat belts.  No firearms. No excess sun exposure. Sees dentist regularly.  Advance directive :  Reviewed  Has one.  Memory: Felt to be good  , no concern from her or her family.  Depression: No anhedonia unusual crying or depressive symptoms  Nutrition: Eats well balanced diet; adequate calcium and vitamin D. No swallowing chewing problems.  Injury: no major injuries in the last six months.  Other healthcare providers:  Reviewed today .  Preventive parameters:   Reviewed   ADLS:   There are no problems or need for assistance  driving, feeding, obtaining food, dressing, toileting and bathing, managing money using phone. She is independent.  ROS:  GEN/ HEENT: No fever, significant weight  changes sweats headaches vision problems hearing changes, CV/ PULM; No chest pain shortness of breath cough, syncope,edema  change in exercise tolerance. GI /GU: No adominal pain, vomiting, change in bowel habits. No blood in the stool. No significant GU symptoms. SKIN/HEME: ,no acute skin rashes suspicious lesions or bleeding. No lymphadenopathy, nodules, masses.  NEURO/ PSYCH:  No neurologic signs such as weakness numbness. No depression anxiety. IMM/ Allergy: No unusual infections.  Allergy .   REST of 12 system review negative except as per HPI   Past Medical History:  Diagnosis Date  . Diabetes mellitus without complication (HCC)   . 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 Goals of care, counseling/discussion 12/15/2019  . Hypertension    echo   . Iron deficiency anemia, unspecified 03/15/2013  . 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 Thrombocytosis     Family History  Problem Relation Age of Onset  . Heart disease Mother        died age 53  . Kidney disease Mother        family hx  . Heart attack Father 20       massive  . Hypertension Sister   . Hypertension Sister   . Hypertension Sister   . Hypertension Sister   . Arthritis Other        family hx  . Diabetes Other        family hx  . Stroke Other  dialysis    Social History   Socioeconomic History  . Marital status: Married    Spouse name: Not on file  . Number of children: Not on file  . Years of education: Not on file  . Highest education level: Not on file  Occupational History  . Occupation: retired  Tobacco Use  . Smoking status: Never Smoker  . Smokeless tobacco: Never Used  . Tobacco comment: never used tobacco  Vaping Use  . Vaping Use: Never used  Substance and Sexual Activity  . Alcohol use: No    Alcohol/week: 0.0 standard drinks  . Drug use: No  . Sexual activity: Not on file  Other Topics Concern  . 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   No tobacco no ethoh little caffiene .    Social Determinants of Health   Financial Resource Strain: Low Risk   . Difficulty of Paying Living Expenses: Not hard at all  Food Insecurity: No Food Insecurity  . Worried About Programme researcher, broadcasting/film/video in the Last Year: Never true  . Ran Out of Food in the Last Year: Never true  Transportation Needs: No Transportation Needs  . Lack of Transportation (Medical): No  . Lack of Transportation (Non-Medical): No  Physical Activity: Insufficiently Active  . Days of Exercise per Week: 3 days  . Minutes of Exercise per Session: 30 min  Stress: No Stress Concern Present  . Feeling of Stress : Not at all  Social Connections: Moderately Integrated  . Frequency of Communication with Friends and Family: Twice a week  . Frequency of Social Gatherings with Friends and Family: Once a week  . Attends Religious Services: More than 4 times per year  . Active Member of Clubs or Organizations: No  . Attends Banker Meetings: Never  . Marital Status: Married    Outpatient Encounter Medications as of 03/20/2020  Medication Sig  . amLODipine (NORVASC) 10 MG tablet TAKE 1 TABLET BY MOUTH EVERY DAY  . anagrelide (AGRYLIN) 1 MG capsule TAKE 5 CAPSULES BY MOUTH DAILY  . aspirin 81 MG tablet Take 81 mg by mouth daily.  . Fe Fum-FePoly-Vit C-Vit B3 (INTEGRA) 62.5-62.5-40-3 MG CAPS TAKE 1 CAPSULE BY MOUTH DAILY  . folic acid (FOLVITE) 1 MG tablet Take 2 tablets by mouth once daily  . glucose blood (ONE TOUCH ULTRA TEST) test strip Use to test blood sugar 1 times daily. **PT NEEDS FOLLOW UP APPT FOR FURTHER REFILLS**  . lisinopril (ZESTRIL) 20 MG tablet TAKE 2 TABLETS BY MOUTH EVERY DAY  . Nebivolol HCl (BYSTOLIC) 20 MG TABS Take 1 tablet (20 mg total) by mouth daily.  Letta Pate DELICA LANCETS 33G MISC Use to test blood sugar 1 time daily. **PT NEEDS FOLLOW UP APPT FOR FURTHER REFILLS**  . Vitamin  D, Ergocalciferol, (DRISDOL) 1.25 MG (50000 UNIT) CAPS capsule TAKE 1 CAPSULE BY MOUTH 1 TIME A WEEK   No facility-administered encounter medications on file as of 03/20/2020.    EXAM:  BP 128/72 (BP Location: Left Arm, Patient Position: Sitting, Cuff Size: Large)   Pulse 85   Temp 97.7 F (36.5 C) (Oral)   Ht 5' 9.5" (1.765 m)   Wt 126 lb (57.2 kg)   SpO2 99%   BMI 18.34 kg/m   Body mass index is 18.34 kg/m.  Physical  Exam: Vital signs reviewed YQM:VHQI is a well-developed well-nourished alert cooperative   who appears stated age in no acute distress.  HEENT: normocephalic atraumatic , Eyes: PERRL EOM's full, conjunctiva clear, Nares: paten,t no deformity discharge or tenderness., Ears: no deformity EAC's clear TMs with normal landmarks. Mouthmasked NECK: supple without masses, thyromegaly or bruits. CHEST/PULM:  Clear to auscultation and percussion breath sounds equal no wheeze , rales or rhonchi. No chest wall deformities or tenderness. Breast no nodules noted  No dc  CV: PMI is nondisplaced, S1 S2 no gallops, murmurs, rubs. Peripheral pulses are full without delay.No JVD .  ABDOMEN: Bowel sounds normal nontender  No guard or rebound, no hepato splenomegal no CVA tenderness.   Extremtities:  No clubbing cyanosis or edema, no acute joint swelling or redness no focal atrophy NEURO:  Oriented x3, cranial nerves 3-12 appear to be intact, no obvious focal weakness,gait within normal limits no abnormal reflexes or asymmetrical SKIN: No acute rashes normal turgor, color, no bruising or petechiae. PSYCH: Oriented, good eye contact, no obvious depression anxiety, cognition and judgment appear normal. LN: no cervical axillary inguinal adenopathy No noted deficits in memory, attention, and speech. Diabetic Foot Exam - Simple   Simple Foot Form Visual Inspection No deformities, no ulcerations, no other skin breakdown bilaterally: Yes Sensation Testing Intact to touch and monofilament  testing bilaterally: Yes Pulse Check Posterior Tibialis and Dorsalis pulse intact bilaterally: Yes Comments Som bunion and toe flexion  No  Callus nl cap refill       Lab Results  Component Value Date   WBC 6.9 03/11/2020   HGB 8.2 (L) 03/11/2020   HCT 26.7 (L) 03/11/2020   PLT 407 (H) 03/11/2020   GLUCOSE 121 (H) 03/11/2020   CHOL 229 (H) 10/18/2017   TRIG 74.0 10/18/2017   HDL 57.40 10/18/2017   LDLDIRECT 127.3 04/19/2012   LDLCALC 157 (H) 10/18/2017   ALT 11 03/11/2020   AST 16 03/11/2020   NA 140 03/11/2020   K 3.4 (L) 03/11/2020   CL 103 03/11/2020   CREATININE 1.03 (H) 03/11/2020   BUN 30 (H) 03/11/2020   CO2 26 03/11/2020   TSH 1.62 05/14/2016   INR 1.1 05/13/2018   HGBA1C 5.9 (H) 12/15/2019   MICROALBUR 48.1 (H) 03/20/2020    ASSESSMENT AND PLAN:  Discussed the following assessment and plan:  Visit for preventive health examination  Medication management  Essential hypertension  Controlled type 2 diabetes mellitus without complication, without long-term current use of insulin (HCC) - Plan: Microalbumin / creatinine urine ratio, Microalbumin / creatinine urine ratio  Erythropoietin deficiency anemia  Hyperlipidemia, unspecified hyperlipidemia type - not on meds  prev given atorva  THROMBOCYTHEMIA  Family history of renal failure - mom and sibling bp good today    Due lipid panel (has been  Offered statin in past   2018)  Follow potassium  Patient Care Team: Sible Straley, Neta Mends, MD as PCP - General Myna Hidalgo Rose Phi, MD as Attending Physician (Hematology and Oncology) Carlus Pavlov, MD as Consulting Physician (Internal Medicine)  Patient Instructions   Glad you are doing well.  Checking urine today for protein ( kidney tests)  Will ask dr Myna Hidalgo to order lipid panel and updated hg a1c at next blood draw.   Continue blood pressure control   Get your yearly eye check .  Bp is good today .   Plan rov in about 6 months  For BP and blood  sugar monitoring  Or as needed  Health Maintenance, Female Adopting a healthy lifestyle and getting preventive care are important in promoting health and wellness. Ask your health care provider about:  The right schedule for you to have regular tests and exams.  Things you can do on your own to prevent diseases and keep yourself healthy. What should I know about diet, weight, and exercise? Eat a healthy diet  Eat a diet that includes plenty of vegetables, fruits, low-fat dairy products, and lean protein.  Do not eat a lot of foods that are high in solid fats, added sugars, or sodium.   Maintain a healthy weight Body mass index (BMI) is used to identify weight problems. It estimates body fat based on height and weight. Your health care provider can help determine your BMI and help you achieve or maintain a healthy weight. Get regular exercise Get regular exercise. This is one of the most important things you can do for your health. Most adults should:  Exercise for at least 150 minutes each week. The exercise should increase your heart rate and make you sweat (moderate-intensity exercise).  Do strengthening exercises at least twice a week. This is in addition to the moderate-intensity exercise.  Spend less time sitting. Even light physical activity can be beneficial. Watch cholesterol and blood lipids Have your blood tested for lipids and cholesterol at 70 years of age, then have this test every 5 years. Have your cholesterol levels checked more often if:  Your lipid or cholesterol levels are high.  You are older than 70 years of age.  You are at high risk for heart disease. What should I know about cancer screening? Depending on your health history and family history, you may need to have cancer screening at various ages. This may include screening for:  Breast cancer.  Cervical cancer.  Colorectal cancer.  Skin cancer.  Lung cancer. What should I know about heart  disease, diabetes, and high blood pressure? Blood pressure and heart disease  High blood pressure causes heart disease and increases the risk of stroke. This is more likely to develop in people who have high blood pressure readings, are of African descent, or are overweight.  Have your blood pressure checked: ? Every 3-5 years if you are 1-66 years of age. ? Every year if you are 60 years old or older. Diabetes Have regular diabetes screenings. This checks your fasting blood sugar level. Have the screening done:  Once every three years after age 44 if you are at a normal weight and have a low risk for diabetes.  More often and at a younger age if you are overweight or have a high risk for diabetes. What should I know about preventing infection? Hepatitis B If you have a higher risk for hepatitis B, you should be screened for this virus. Talk with your health care provider to find out if you are at risk for hepatitis B infection. Hepatitis C Testing is recommended for:  Everyone born from [birth year] through 1965.  Anyone with known risk factors for hepatitis C. Sexually transmitted infections (STIs)  Get screened for STIs, including gonorrhea and chlamydia, if: ? You are sexually active and are younger than 70 years of age. ? You are older than 70 years of age and your health care provider tells you that you are at risk for this type of infection. ? Your sexual activity has changed since you were last screened, and you are at increased risk for chlamydia or gonorrhea. Ask your health care provider  if you are at risk.  Ask your health care provider about whether you are at high risk for HIV. Your health care provider may recommend a prescription medicine to help prevent HIV infection. If you choose to take medicine to prevent HIV, you should first get tested for HIV. You should then be tested every 3 months for as long as you are taking the medicine. Pregnancy  If you are about to stop  having your period (premenopausal) and you may become pregnant, seek counseling before you get pregnant.  Take 400 to 800 micrograms (mcg) of folic acid every day if you become pregnant.  Ask for birth control (contraception) if you want to prevent pregnancy. Osteoporosis and menopause Osteoporosis is a disease in which the bones lose minerals and strength with aging. This can result in bone fractures. If you are 40 years old or older, or if you are at risk for osteoporosis and fractures, ask your health care provider if you should:  Be screened for bone loss.  Take a calcium or vitamin D supplement to lower your risk of fractures.  Be given hormone replacement therapy (HRT) to treat symptoms of menopause. Follow these instructions at home: Lifestyle  Do not use any products that contain nicotine or tobacco, such as cigarettes, e-cigarettes, and chewing tobacco. If you need help quitting, ask your health care provider.  Do not use street drugs.  Do not share needles.  Ask your health care provider for help if you need support or information about quitting drugs. Alcohol use  Do not drink alcohol if: ? Your health care provider tells you not to drink. ? You are pregnant, may be pregnant, or are planning to become pregnant.  If you drink alcohol: ? Limit how much you use to 0-1 drink a day. ? Limit intake if you are breastfeeding.  Be aware of how much alcohol is in your drink. In the [place] one drink equals one 12 oz bottle of beer (355 mL), one 5 oz glass of wine (148 mL), or one 1 oz glass of hard liquor (44 mL). General instructions  Schedule regular health, dental, and eye exams.  Stay current with your vaccines.  Tell your health care provider if: ? You often feel depressed. ? You have ever been abused or do not feel safe at home. Summary  Adopting a healthy lifestyle and getting preventive care are important in promoting health and wellness.  Follow your health  care provider's instructions about healthy diet, exercising, and getting tested or screened for diseases.  Follow your health care provider's instructions on monitoring your cholesterol and blood pressure. This information is not intended to replace advice given to you by your health care provider. Make sure you discuss any questions you have with your health care provider. Document Revised: 02/09/2018 Document Reviewed: 02/09/2018 Elsevier Patient Education  2021 [employer].    Iago. Grecia Lynk M.D.

## 2020-03-20 ENCOUNTER — Encounter: Payer: Self-pay | Admitting: Internal Medicine

## 2020-03-20 ENCOUNTER — Other Ambulatory Visit: Payer: Self-pay

## 2020-03-20 ENCOUNTER — Ambulatory Visit (INDEPENDENT_AMBULATORY_CARE_PROVIDER_SITE_OTHER): Payer: Medicare PPO | Admitting: Internal Medicine

## 2020-03-20 VITALS — BP 128/72 | HR 85 | Temp 97.7°F | Ht 69.5 in | Wt 126.0 lb

## 2020-03-20 DIAGNOSIS — D473 Essential (hemorrhagic) thrombocythemia: Secondary | ICD-10-CM

## 2020-03-20 DIAGNOSIS — I10 Essential (primary) hypertension: Secondary | ICD-10-CM

## 2020-03-20 DIAGNOSIS — E785 Hyperlipidemia, unspecified: Secondary | ICD-10-CM

## 2020-03-20 DIAGNOSIS — R011 Cardiac murmur, unspecified: Secondary | ICD-10-CM

## 2020-03-20 DIAGNOSIS — D471 Chronic myeloproliferative disease: Secondary | ICD-10-CM

## 2020-03-20 DIAGNOSIS — Z79899 Other long term (current) drug therapy: Secondary | ICD-10-CM

## 2020-03-20 DIAGNOSIS — Z841 Family history of disorders of kidney and ureter: Secondary | ICD-10-CM | POA: Diagnosis not present

## 2020-03-20 DIAGNOSIS — E119 Type 2 diabetes mellitus without complications: Secondary | ICD-10-CM

## 2020-03-20 DIAGNOSIS — Z0000 Encounter for general adult medical examination without abnormal findings: Secondary | ICD-10-CM

## 2020-03-20 DIAGNOSIS — D631 Anemia in chronic kidney disease: Secondary | ICD-10-CM | POA: Diagnosis not present

## 2020-03-20 LAB — MICROALBUMIN / CREATININE URINE RATIO
Creatinine,U: 178.7 mg/dL
Microalb Creat Ratio: 26.9 mg/g (ref 0.0–30.0)
Microalb, Ur: 48.1 mg/dL — ABNORMAL HIGH (ref 0.0–1.9)

## 2020-03-20 NOTE — Patient Instructions (Addendum)
Glad you are doing well.  Checking urine today for protein ( kidney tests)  Will ask dr Marin Olp to order lipid panel and updated hg a1c at next blood draw.   Continue blood pressure control   Get your yearly eye check .  Bp is good today .   Plan rov in about 6 months  For BP and blood sugar monitoring  Or as needed    Health Maintenance, Female Adopting a healthy lifestyle and getting preventive care are important in promoting health and wellness. Ask your health care provider about:  The right schedule for you to have regular tests and exams.  Things you can do on your own to prevent diseases and keep yourself healthy. What should I know about diet, weight, and exercise? Eat a healthy diet  Eat a diet that includes plenty of vegetables, fruits, low-fat dairy products, and lean protein.  Do not eat a lot of foods that are high in solid fats, added sugars, or sodium.   Maintain a healthy weight Body mass index (BMI) is used to identify weight problems. It estimates body fat based on height and weight. Your health care provider can help determine your BMI and help you achieve or maintain a healthy weight. Get regular exercise Get regular exercise. This is one of the most important things you can do for your health. Most adults should:  Exercise for at least 150 minutes each week. The exercise should increase your heart rate and make you sweat (moderate-intensity exercise).  Do strengthening exercises at least twice a week. This is in addition to the moderate-intensity exercise.  Spend less time sitting. Even light physical activity can be beneficial. Watch cholesterol and blood lipids Have your blood tested for lipids and cholesterol at 70 years of age, then have this test every 5 years. Have your cholesterol levels checked more often if:  Your lipid or cholesterol levels are high.  You are older than 70 years of age.  You are at high risk for heart disease. What should I  know about cancer screening? Depending on your health history and family history, you may need to have cancer screening at various ages. This may include screening for:  Breast cancer.  Cervical cancer.  Colorectal cancer.  Skin cancer.  Lung cancer. What should I know about heart disease, diabetes, and high blood pressure? Blood pressure and heart disease  High blood pressure causes heart disease and increases the risk of stroke. This is more likely to develop in people who have high blood pressure readings, are of African descent, or are overweight.  Have your blood pressure checked: ? Every 3-5 years if you are 68-76 years of age. ? Every year if you are 4 years old or older. Diabetes Have regular diabetes screenings. This checks your fasting blood sugar level. Have the screening done:  Once every three years after age 30 if you are at a normal weight and have a low risk for diabetes.  More often and at a younger age if you are overweight or have a high risk for diabetes. What should I know about preventing infection? Hepatitis B If you have a higher risk for hepatitis B, you should be screened for this virus. Talk with your health care provider to find out if you are at risk for hepatitis B infection. Hepatitis C Testing is recommended for:  Everyone born from [birth year] through 1965.  Anyone with known risk factors for hepatitis C. Sexually transmitted infections (STIs)  Get  screened for STIs, including gonorrhea and chlamydia, if: ? You are sexually active and are younger than 70 years of age. ? You are older than 70 years of age and your health care provider tells you that you are at risk for this type of infection. ? Your sexual activity has changed since you were last screened, and you are at increased risk for chlamydia or gonorrhea. Ask your health care provider if you are at risk.  Ask your health care provider about whether you are at high risk for HIV. Your health  care provider may recommend a prescription medicine to help prevent HIV infection. If you choose to take medicine to prevent HIV, you should first get tested for HIV. You should then be tested every 3 months for as long as you are taking the medicine. Pregnancy  If you are about to stop having your period (premenopausal) and you may become pregnant, seek counseling before you get pregnant.  Take 400 to 800 micrograms (mcg) of folic acid every day if you become pregnant.  Ask for birth control (contraception) if you want to prevent pregnancy. Osteoporosis and menopause Osteoporosis is a disease in which the bones lose minerals and strength with aging. This can result in bone fractures. If you are 39 years old or older, or if you are at risk for osteoporosis and fractures, ask your health care provider if you should:  Be screened for bone loss.  Take a calcium or vitamin D supplement to lower your risk of fractures.  Be given hormone replacement therapy (HRT) to treat symptoms of menopause. Follow these instructions at home: Lifestyle  Do not use any products that contain nicotine or tobacco, such as cigarettes, e-cigarettes, and chewing tobacco. If you need help quitting, ask your health care provider.  Do not use street drugs.  Do not share needles.  Ask your health care provider for help if you need support or information about quitting drugs. Alcohol use  Do not drink alcohol if: ? Your health care provider tells you not to drink. ? You are pregnant, may be pregnant, or are planning to become pregnant.  If you drink alcohol: ? Limit how much you use to 0-1 drink a day. ? Limit intake if you are breastfeeding.  Be aware of how much alcohol is in your drink. In the [place] one drink equals one 12 oz bottle of beer (355 mL), one 5 oz glass of wine (148 mL), or one 1 oz glass of hard liquor (44 mL). General instructions  Schedule regular health, dental, and eye exams.  Stay  current with your vaccines.  Tell your health care provider if: ? You often feel depressed. ? You have ever been abused or do not feel safe at home. Summary  Adopting a healthy lifestyle and getting preventive care are important in promoting health and wellness.  Follow your health care provider's instructions about healthy diet, exercising, and getting tested or screened for diseases.  Follow your health care provider's instructions on monitoring your cholesterol and blood pressure. This information is not intended to replace advice given to you by your health care provider. Make sure you discuss any questions you have with your health care provider. Document Revised: 02/09/2018 Document Reviewed: 02/09/2018 Elsevier Patient Education  2021 [employer].

## 2020-03-21 ENCOUNTER — Other Ambulatory Visit: Payer: Self-pay | Admitting: Hematology & Oncology

## 2020-03-21 DIAGNOSIS — E119 Type 2 diabetes mellitus without complications: Secondary | ICD-10-CM

## 2020-03-21 NOTE — Progress Notes (Signed)
Urine protein ratio is normal   reassuring.  Will follow .

## 2020-04-10 ENCOUNTER — Other Ambulatory Visit: Payer: Self-pay | Admitting: Family

## 2020-04-10 DIAGNOSIS — D473 Essential (hemorrhagic) thrombocythemia: Secondary | ICD-10-CM

## 2020-04-10 DIAGNOSIS — D508 Other iron deficiency anemias: Secondary | ICD-10-CM

## 2020-04-18 ENCOUNTER — Other Ambulatory Visit: Payer: Self-pay | Admitting: Internal Medicine

## 2020-04-22 ENCOUNTER — Inpatient Hospital Stay (HOSPITAL_BASED_OUTPATIENT_CLINIC_OR_DEPARTMENT_OTHER): Payer: Medicare PPO | Admitting: Hematology & Oncology

## 2020-04-22 ENCOUNTER — Encounter: Payer: Self-pay | Admitting: Hematology & Oncology

## 2020-04-22 ENCOUNTER — Other Ambulatory Visit: Payer: Self-pay

## 2020-04-22 ENCOUNTER — Inpatient Hospital Stay: Payer: Medicare PPO | Attending: Hematology & Oncology

## 2020-04-22 VITALS — BP 143/77 | HR 73 | Temp 97.8°F | Resp 18 | Wt 127.0 lb

## 2020-04-22 DIAGNOSIS — D473 Essential (hemorrhagic) thrombocythemia: Secondary | ICD-10-CM | POA: Diagnosis not present

## 2020-04-22 DIAGNOSIS — D631 Anemia in chronic kidney disease: Secondary | ICD-10-CM | POA: Diagnosis not present

## 2020-04-22 DIAGNOSIS — N189 Chronic kidney disease, unspecified: Secondary | ICD-10-CM | POA: Diagnosis not present

## 2020-04-22 DIAGNOSIS — Z79899 Other long term (current) drug therapy: Secondary | ICD-10-CM | POA: Diagnosis not present

## 2020-04-22 DIAGNOSIS — E119 Type 2 diabetes mellitus without complications: Secondary | ICD-10-CM

## 2020-04-22 LAB — CBC WITH DIFFERENTIAL (CANCER CENTER ONLY)
Abs Immature Granulocytes: 0.04 10*3/uL (ref 0.00–0.07)
Basophils Absolute: 0.1 10*3/uL (ref 0.0–0.1)
Basophils Relative: 1 %
Eosinophils Absolute: 0.2 10*3/uL (ref 0.0–0.5)
Eosinophils Relative: 2 %
HCT: 25.8 % — ABNORMAL LOW (ref 36.0–46.0)
Hemoglobin: 8 g/dL — ABNORMAL LOW (ref 12.0–15.0)
Immature Granulocytes: 1 %
Lymphocytes Relative: 23 %
Lymphs Abs: 1.5 10*3/uL (ref 0.7–4.0)
MCH: 24.9 pg — ABNORMAL LOW (ref 26.0–34.0)
MCHC: 31 g/dL (ref 30.0–36.0)
MCV: 80.4 fL (ref 80.0–100.0)
Monocytes Absolute: 0.6 10*3/uL (ref 0.1–1.0)
Monocytes Relative: 9 %
Neutro Abs: 4.2 10*3/uL (ref 1.7–7.7)
Neutrophils Relative %: 64 %
Platelet Count: 358 10*3/uL (ref 150–400)
RBC: 3.21 MIL/uL — ABNORMAL LOW (ref 3.87–5.11)
RDW: 19.6 % — ABNORMAL HIGH (ref 11.5–15.5)
WBC Count: 6.6 10*3/uL (ref 4.0–10.5)
nRBC: 0 % (ref 0.0–0.2)

## 2020-04-22 LAB — CMP (CANCER CENTER ONLY)
ALT: 17 U/L (ref 0–44)
AST: 17 U/L (ref 15–41)
Albumin: 4.3 g/dL (ref 3.5–5.0)
Alkaline Phosphatase: 76 U/L (ref 38–126)
Anion gap: 8 (ref 5–15)
BUN: 29 mg/dL — ABNORMAL HIGH (ref 8–23)
CO2: 29 mmol/L (ref 22–32)
Calcium: 9.4 mg/dL (ref 8.9–10.3)
Chloride: 103 mmol/L (ref 98–111)
Creatinine: 1.18 mg/dL — ABNORMAL HIGH (ref 0.44–1.00)
GFR, Estimated: 50 mL/min — ABNORMAL LOW (ref >60)
Glucose, Bld: 109 mg/dL — ABNORMAL HIGH (ref 70–99)
Potassium: 3.6 mmol/L (ref 3.5–5.1)
Sodium: 140 mmol/L (ref 135–145)
Total Bilirubin: 0.6 mg/dL (ref 0.3–1.2)
Total Protein: 7.4 g/dL (ref 6.5–8.1)

## 2020-04-22 LAB — LACTATE DEHYDROGENASE: LDH: 219 U/L — ABNORMAL HIGH (ref 98–192)

## 2020-04-22 LAB — LIPID PANEL
Cholesterol: 253 mg/dL — ABNORMAL HIGH (ref 0–200)
HDL: 64 mg/dL (ref >40)
LDL Cholesterol: 178 mg/dL — ABNORMAL HIGH (ref 0–99)
Total CHOL/HDL Ratio: 4 RATIO
Triglycerides: 56 mg/dL (ref <150)
VLDL: 11 mg/dL (ref 0–40)

## 2020-04-22 LAB — HEMOGLOBIN A1C
Hgb A1c MFr Bld: 6 % — ABNORMAL HIGH (ref 4.8–5.6)
Mean Plasma Glucose: 125.5 mg/dL

## 2020-04-22 LAB — RETICULOCYTES
Immature Retic Fract: 20.5 % — ABNORMAL HIGH (ref 2.3–15.9)
RBC.: 3.21 MIL/uL — ABNORMAL LOW (ref 3.87–5.11)
Retic Count, Absolute: 36.9 10*3/uL (ref 19.0–186.0)
Retic Ct Pct: 1.2 % (ref 0.4–3.1)

## 2020-04-22 LAB — SAVE SMEAR(SSMR), FOR PROVIDER SLIDE REVIEW

## 2020-04-22 NOTE — Progress Notes (Signed)
[name]  [employer] is [employer].  Hematology and Oncology Follow Up Visit  Alexandra [name] [id number] Nov 08, 1950 70 y.o. 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 is back for followup.  She is a little bit down today.  Her second older sister passed away recently.  She was down in [place], [place].  She had multiple health issues.  Otherwise, Alexandra [name] seems to be managing.  She is doing well on the anagrelide.  She has had no problems with anagrelide.  Her platelet count is holding nice and steady.  She has had no fever.  There is no cough or shortness of breath.  She has had no change in bowel or bladder habits.  There is been no leg swelling.  She has had no palpitations.  She has had no fever.  Overall, I would say that her performance status is ECOG 1.    Medications:  Current Outpatient Medications:  .  amLODipine (NORVASC) 10 MG tablet, TAKE 1 TABLET BY MOUTH EVERY DAY, Disp: 90 tablet, Rfl: 1 .  anagrelide (AGRYLIN) 1 MG capsule, TAKE 5 CAPSULES BY MOUTH DAILY, Disp: 450 capsule, Rfl: 3 .  aspirin 81 MG tablet, Take 81 mg by mouth daily., Disp: , Rfl:  .  Fe Fum-FePoly-Vit C-Vit B3 (INTEGRA) 62.5-62.5-40-3 MG CAPS, TAKE 1 CAPSULE BY MOUTH DAILY, Disp: 30 capsule, Rfl: 8 .  folic acid (FOLVITE) 1 MG tablet, Take 2 tablets by mouth once daily, Disp: 60 tablet, Rfl: 0 .  glucose blood (ONE TOUCH ULTRA TEST) test strip, Use to test blood sugar 1 times daily. **PT NEEDS FOLLOW UP APPT FOR FURTHER REFILLS**, Disp: 100 each, Rfl: 0 .  lisinopril (ZESTRIL) 20 MG tablet, TAKE 2 TABLETS BY MOUTH EVERY DAY, Disp: 180 tablet, Rfl: 3 .  Nebivolol HCl 20 MG TABS, TAKE 1  TABLET(20 MG) BY MOUTH DAILY, Disp: 90 tablet, Rfl: 1 .  ONETOUCH DELICA LANCETS 40G MISC, Use to test blood sugar 1 time daily. **PT NEEDS FOLLOW UP APPT FOR FURTHER REFILLS**, Disp: 100 each, Rfl: 1 .  Vitamin D, Ergocalciferol, (DRISDOL) 1.25 MG (50000 UNIT) CAPS capsule, TAKE 1 CAPSULE BY MOUTH 1 TIME A WEEK, Disp: 12 capsule, Rfl: 2  Allergies: No Known Allergies  Past Medical History, Surgical history, Social history, and Family History were reviewed and updated.  Review of Systems: Review of Systems  All other systems reviewed and are negative. Marland Kitchen  Physical Exam:  weight is 127 lb (57.6 kg). Her oral temperature is 97.8 F (36.6 C). Her blood pressure is 143/77 (abnormal) and her pulse is 73. Her respiration is 18 and oxygen saturation is 100%. =jh  Physical Exam Vitals reviewed.  HENT:     Head: Normocephalic and atraumatic.  Eyes:     Pupils: Pupils are equal, round, and reactive to light.  Cardiovascular:     Rate and Rhythm: Normal rate and regular rhythm.     Heart sounds: Normal heart sounds.  Pulmonary:     Effort: Pulmonary effort is normal.     Breath sounds: Normal breath sounds.  Abdominal:     General: Bowel  sounds are normal.     Palpations: Abdomen is soft.  Musculoskeletal:        General: No tenderness or deformity. Normal range of motion.     Cervical back: Normal range of motion.  Lymphadenopathy:     Cervical: No cervical adenopathy.  Skin:    General: Skin is warm and dry.     Findings: No erythema or rash.  Neurological:     Mental Status: She is alert and oriented to person, place, and time.  Psychiatric:        Behavior: Behavior normal.        Thought Content: Thought content normal.        Judgment: Judgment normal.      Lab Results  Component Value Date   WBC 6.6 04/22/2020   HGB 8.0 (L) 04/22/2020   HCT 25.8 (L) 04/22/2020   MCV 80.4 04/22/2020   PLT 358 04/22/2020     Chemistry      Component Value Date/Time   NA 140  03/11/2020 1141   NA 144 02/02/2017 1158   NA 140 07/24/2016 1007   K 3.4 (L) 03/11/2020 1141   K 3.7 02/02/2017 1158   K 3.6 07/24/2016 1007   CL 103 03/11/2020 1141   CL 103 02/02/2017 1158   CO2 26 03/11/2020 1141   CO2 29 02/02/2017 1158   CO2 26 07/24/2016 1007   BUN 30 (H) 03/11/2020 1141   BUN 20 02/02/2017 1158   BUN 30.3 (H) 07/24/2016 1007   CREATININE 1.03 (H) 03/11/2020 1141   CREATININE 0.9 02/02/2017 1158   CREATININE 1.1 07/24/2016 1007   GLU 129 02/02/2017 0000      Component Value Date/Time   CALCIUM 9.1 03/11/2020 1141   CALCIUM 9.0 02/02/2017 1158   CALCIUM 9.3 07/24/2016 1007   ALKPHOS 74 03/11/2020 1141   ALKPHOS 110 (H) 02/02/2017 1158   ALKPHOS 104 07/24/2016 1007   AST 16 03/11/2020 1141   AST 13 07/24/2016 1007   ALT 11 03/11/2020 1141   ALT 26 02/02/2017 1158   ALT 15 07/24/2016 1007   BILITOT 0.9 03/11/2020 1141   BILITOT 0.55 07/24/2016 1007      Impression and Plan: Alexandra [name] is 70 year old [name]. She has essential thrombocythemia. She actually is Calreticulin positive.  I am very encouraged by the fact that her platelet count is still relatively normal.  I looked at her peripheral blood smear.  I do not see anything that looked suspicious for any type of progression.  She still does not want any Aranesp.  We have talked about this.  She is still willing to hold off on Aranesp as she is really not bothered by her anemia.  She is still active.  She still exercises and walks.  I think we can now try to get her back in 2 months.  She was knows that she can come back sooner if she has any problems.     [name], MD 2/21/202212:32 PM

## 2020-04-23 ENCOUNTER — Telehealth: Payer: Self-pay

## 2020-04-23 LAB — IRON AND TIBC
Iron: 77 ug/dL (ref 41–142)
Saturation Ratios: 35 % (ref 21–57)
TIBC: 223 ug/dL — ABNORMAL LOW (ref 236–444)
UIBC: 146 ug/dL (ref 120–384)

## 2020-04-23 LAB — FERRITIN: Ferritin: 280 ng/mL (ref 11–307)

## 2020-04-23 NOTE — Telephone Encounter (Signed)
-----   Message from [name], MD sent at 04/22/2020  5:14 PM EST ----- Call - the cholesterol is quite elevated, but you do have a higher HDL which is good cholesterol!!!  Laurey Arrow

## 2020-04-23 NOTE — Telephone Encounter (Signed)
Called and informed patient of lab results, patient verbalized understanding and denies any questions or concerns at this time.   

## 2020-05-17 ENCOUNTER — Other Ambulatory Visit: Payer: Self-pay | Admitting: Family

## 2020-05-17 DIAGNOSIS — D473 Essential (hemorrhagic) thrombocythemia: Secondary | ICD-10-CM

## 2020-05-17 DIAGNOSIS — D508 Other iron deficiency anemias: Secondary | ICD-10-CM

## 2020-06-20 ENCOUNTER — Inpatient Hospital Stay (HOSPITAL_BASED_OUTPATIENT_CLINIC_OR_DEPARTMENT_OTHER): Payer: Medicare PPO | Admitting: Hematology & Oncology

## 2020-06-20 ENCOUNTER — Other Ambulatory Visit: Payer: Self-pay

## 2020-06-20 ENCOUNTER — Inpatient Hospital Stay: Payer: Medicare PPO | Attending: Hematology & Oncology

## 2020-06-20 ENCOUNTER — Encounter: Payer: Self-pay | Admitting: Hematology & Oncology

## 2020-06-20 VITALS — BP 142/86 | HR 81 | Temp 98.1°F | Resp 18 | Ht 69.5 in | Wt 124.1 lb

## 2020-06-20 DIAGNOSIS — D473 Essential (hemorrhagic) thrombocythemia: Secondary | ICD-10-CM

## 2020-06-20 DIAGNOSIS — Z79899 Other long term (current) drug therapy: Secondary | ICD-10-CM | POA: Diagnosis not present

## 2020-06-20 DIAGNOSIS — D75839 Thrombocytosis, unspecified: Secondary | ICD-10-CM | POA: Insufficient documentation

## 2020-06-20 DIAGNOSIS — D631 Anemia in chronic kidney disease: Secondary | ICD-10-CM | POA: Insufficient documentation

## 2020-06-20 DIAGNOSIS — N189 Chronic kidney disease, unspecified: Secondary | ICD-10-CM | POA: Insufficient documentation

## 2020-06-20 DIAGNOSIS — Z7982 Long term (current) use of aspirin: Secondary | ICD-10-CM | POA: Insufficient documentation

## 2020-06-20 LAB — IRON AND TIBC
Iron: 77 ug/dL (ref 41–142)
Saturation Ratios: 34 % (ref 21–57)
TIBC: 224 ug/dL — ABNORMAL LOW (ref 236–444)
UIBC: 147 ug/dL (ref 120–384)

## 2020-06-20 LAB — CMP (CANCER CENTER ONLY)
ALT: 14 U/L (ref 0–44)
AST: 16 U/L (ref 15–41)
Albumin: 4.3 g/dL (ref 3.5–5.0)
Alkaline Phosphatase: 84 U/L (ref 38–126)
Anion gap: 7 (ref 5–15)
BUN: 32 mg/dL — ABNORMAL HIGH (ref 8–23)
CO2: 30 mmol/L (ref 22–32)
Calcium: 10 mg/dL (ref 8.9–10.3)
Chloride: 104 mmol/L (ref 98–111)
Creatinine: 1.13 mg/dL — ABNORMAL HIGH (ref 0.44–1.00)
GFR, Estimated: 52 mL/min — ABNORMAL LOW (ref >60)
Glucose, Bld: 110 mg/dL — ABNORMAL HIGH (ref 70–99)
Potassium: 3.9 mmol/L (ref 3.5–5.1)
Sodium: 141 mmol/L (ref 135–145)
Total Bilirubin: 0.6 mg/dL (ref 0.3–1.2)
Total Protein: 7.7 g/dL (ref 6.5–8.1)

## 2020-06-20 LAB — CBC WITH DIFFERENTIAL (CANCER CENTER ONLY)
Abs Immature Granulocytes: 0.03 10*3/uL (ref 0.00–0.07)
Basophils Absolute: 0.1 10*3/uL (ref 0.0–0.1)
Basophils Relative: 1 %
Eosinophils Absolute: 0.2 10*3/uL (ref 0.0–0.5)
Eosinophils Relative: 2 %
HCT: 28.2 % — ABNORMAL LOW (ref 36.0–46.0)
Hemoglobin: 8.7 g/dL — ABNORMAL LOW (ref 12.0–15.0)
Immature Granulocytes: 1 %
Lymphocytes Relative: 26 %
Lymphs Abs: 1.7 10*3/uL (ref 0.7–4.0)
MCH: 25 pg — ABNORMAL LOW (ref 26.0–34.0)
MCHC: 30.9 g/dL (ref 30.0–36.0)
MCV: 81 fL (ref 80.0–100.0)
Monocytes Absolute: 0.7 10*3/uL (ref 0.1–1.0)
Monocytes Relative: 11 %
Neutro Abs: 3.9 10*3/uL (ref 1.7–7.7)
Neutrophils Relative %: 59 %
Platelet Count: 369 10*3/uL (ref 150–400)
RBC: 3.48 MIL/uL — ABNORMAL LOW (ref 3.87–5.11)
RDW: 19.2 % — ABNORMAL HIGH (ref 11.5–15.5)
WBC Count: 6.5 10*3/uL (ref 4.0–10.5)
nRBC: 0 % (ref 0.0–0.2)

## 2020-06-20 LAB — SAVE SMEAR(SSMR), FOR PROVIDER SLIDE REVIEW

## 2020-06-20 LAB — FERRITIN: Ferritin: 286 ng/mL (ref 11–307)

## 2020-06-20 LAB — LACTATE DEHYDROGENASE: LDH: 212 U/L — ABNORMAL HIGH (ref 98–192)

## 2020-06-20 NOTE — Progress Notes (Signed)
Plan Alexandra [name]  [employer] is [employer].  Hematology and Oncology Follow Up Visit  Alexandra [name] [id number] January 05, 1951 70 y.o. 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 is back for followup.  She has been quite busy.  She was down in [place] for [place] [employer].  A sister apparently had some toes amputated because of peripheral vascular disease.  She otherwise seems to be doing pretty well.  She has a pretty "full" calendar for the next month or so.  She will be traveling to [place] and [place] and [place] for graduations.  She is done very nicely on the anagrelide.  She is on 5 mg of anagrelide daily.  She has had no problems with side effects from the high dose of anagrelide.  It is kept her platelet count nice and balanced.  She has had no problems with fever.  She has had no cough or shortness of breath.  She has had no leg swelling.    There is been no bleeding.  Overall, her performance status is ECOG 1.   Medications:  Current Outpatient Medications:  .  amLODipine (NORVASC) 10 MG tablet, TAKE 1 TABLET BY MOUTH EVERY DAY, Disp: 90 tablet, Rfl: 1 .  anagrelide (AGRYLIN) 1 MG capsule, TAKE 5 CAPSULES BY MOUTH DAILY, Disp: 450 capsule, Rfl: 3 .  aspirin 81 MG tablet, Take 81 mg by mouth daily., Disp: , Rfl:  .  Fe Fum-FePoly-Vit C-Vit B3 (INTEGRA) 62.5-62.5-40-3 MG CAPS, TAKE 1 CAPSULE BY MOUTH DAILY, Disp: 30 capsule, Rfl: 8 .  folic acid (FOLVITE) 1 MG tablet, Take 2 tablets by mouth once daily, Disp: 60 tablet, Rfl: 0 .  glucose blood (ONE TOUCH ULTRA TEST) test strip, Use to test blood sugar 1 times daily. **PT NEEDS FOLLOW UP APPT FOR FURTHER REFILLS**, Disp: 100 each, Rfl:  0 .  lisinopril (ZESTRIL) 20 MG tablet, TAKE 2 TABLETS BY MOUTH EVERY DAY, Disp: 180 tablet, Rfl: 3 .  Nebivolol HCl 20 MG TABS, TAKE 1 TABLET(20 MG) BY MOUTH DAILY, Disp: 90 tablet, Rfl: 1 .  ONETOUCH DELICA LANCETS 75I MISC, Use to test blood sugar 1 time daily. **PT NEEDS FOLLOW UP APPT FOR FURTHER REFILLS**, Disp: 100 each, Rfl: 1 .  Vitamin D, Ergocalciferol, (DRISDOL) 1.25 MG (50000 UNIT) CAPS capsule, TAKE 1 CAPSULE BY MOUTH 1 TIME A WEEK, Disp: 12 capsule, Rfl: 2  Allergies: No Known Allergies  Past Medical History, Surgical history, Social history, and Family History were reviewed and updated.  Review of Systems: Review of Systems  All other systems reviewed and are negative. Marland Kitchen  Physical Exam:  height is 5' 9.5" (1.765 m) and weight is 124 lb 1.9 oz (56.3 kg). Her oral temperature is 98.1 F (36.7 C). Her blood pressure is 142/86 (abnormal) and her pulse is 81. Her respiration is 18 and oxygen saturation is 100%. =jh  Physical Exam Vitals reviewed.  HENT:     Head: Normocephalic and atraumatic.  Eyes:     Pupils: Pupils are equal, round, and reactive to light.  Cardiovascular:     Rate and Rhythm: Normal rate and regular rhythm.  Heart sounds: Normal heart sounds.  Pulmonary:     Effort: Pulmonary effort is normal.     Breath sounds: Normal breath sounds.  Abdominal:     General: Bowel sounds are normal.     Palpations: Abdomen is soft.  Musculoskeletal:        General: No tenderness or deformity. Normal range of motion.     Cervical back: Normal range of motion.  Lymphadenopathy:     Cervical: No cervical adenopathy.  Skin:    General: Skin is warm and dry.     Findings: No erythema or rash.  Neurological:     Mental Status: She is alert and oriented to person, place, and time.  Psychiatric:        Behavior: Behavior normal.        Thought Content: Thought content normal.        Judgment: Judgment normal.      Lab Results  Component Value Date   WBC  6.5 06/20/2020   HGB 8.7 (L) 06/20/2020   HCT 28.2 (L) 06/20/2020   MCV 81.0 06/20/2020   PLT 369 06/20/2020     Chemistry      Component Value Date/Time   NA 141 06/20/2020 1139   NA 144 02/02/2017 1158   NA 140 07/24/2016 1007   K 3.9 06/20/2020 1139   K 3.7 02/02/2017 1158   K 3.6 07/24/2016 1007   CL 104 06/20/2020 1139   CL 103 02/02/2017 1158   CO2 30 06/20/2020 1139   CO2 29 02/02/2017 1158   CO2 26 07/24/2016 1007   BUN 32 (H) 06/20/2020 1139   BUN 20 02/02/2017 1158   BUN 30.3 (H) 07/24/2016 1007   CREATININE 1.13 (H) 06/20/2020 1139   CREATININE 0.9 02/02/2017 1158   CREATININE 1.1 07/24/2016 1007   GLU 129 02/02/2017 0000      Component Value Date/Time   CALCIUM 10.0 06/20/2020 1139   CALCIUM 9.0 02/02/2017 1158   CALCIUM 9.3 07/24/2016 1007   ALKPHOS 84 06/20/2020 1139   ALKPHOS 110 (H) 02/02/2017 1158   ALKPHOS 104 07/24/2016 1007   AST 16 06/20/2020 1139   AST 13 07/24/2016 1007   ALT 14 06/20/2020 1139   ALT 26 02/02/2017 1158   ALT 15 07/24/2016 1007   BILITOT 0.6 06/20/2020 1139   BILITOT 0.55 07/24/2016 1007      Impression and Plan: Alexandra [name] is 70 year old [name]. She has essential thrombocythemia. She actually is Calreticulin positive.  I am very encouraged by the fact that her platelet count is still relatively normal.  I looked at her peripheral blood smear.  I do not see anything that looked suspicious for any type of progression.  Her hemoglobin is a little bit better so I do not think we have to worry about Aranesp.  We talked her about this in the past.  She just wanted to hold off on this.  I am just glad that she has no symptoms.  She really is doing nicely.  Hopefully we can now get her back after the [place] weekend.  I know that she will be quite busy traveling during May.     [name], MD 4/21/202212:11 PM

## 2020-06-27 ENCOUNTER — Other Ambulatory Visit: Payer: Self-pay | Admitting: Hematology & Oncology

## 2020-06-27 DIAGNOSIS — D508 Other iron deficiency anemias: Secondary | ICD-10-CM

## 2020-06-27 DIAGNOSIS — D473 Essential (hemorrhagic) thrombocythemia: Secondary | ICD-10-CM

## 2020-08-08 ENCOUNTER — Encounter: Payer: Self-pay | Admitting: Hematology & Oncology

## 2020-08-08 ENCOUNTER — Other Ambulatory Visit: Payer: Self-pay

## 2020-08-08 ENCOUNTER — Inpatient Hospital Stay: Payer: Medicare PPO | Attending: Hematology & Oncology

## 2020-08-08 ENCOUNTER — Inpatient Hospital Stay (HOSPITAL_BASED_OUTPATIENT_CLINIC_OR_DEPARTMENT_OTHER): Payer: Medicare PPO | Admitting: Hematology & Oncology

## 2020-08-08 VITALS — BP 151/80 | HR 79 | Temp 98.0°F | Resp 20 | Wt 125.0 lb

## 2020-08-08 DIAGNOSIS — D473 Essential (hemorrhagic) thrombocythemia: Secondary | ICD-10-CM | POA: Diagnosis not present

## 2020-08-08 DIAGNOSIS — N189 Chronic kidney disease, unspecified: Secondary | ICD-10-CM | POA: Diagnosis not present

## 2020-08-08 DIAGNOSIS — Z7982 Long term (current) use of aspirin: Secondary | ICD-10-CM | POA: Insufficient documentation

## 2020-08-08 DIAGNOSIS — Z79899 Other long term (current) drug therapy: Secondary | ICD-10-CM | POA: Diagnosis not present

## 2020-08-08 DIAGNOSIS — D631 Anemia in chronic kidney disease: Secondary | ICD-10-CM | POA: Insufficient documentation

## 2020-08-08 LAB — CMP (CANCER CENTER ONLY)
ALT: 13 U/L (ref 0–44)
AST: 18 U/L (ref 15–41)
Albumin: 4.1 g/dL (ref 3.5–5.0)
Alkaline Phosphatase: 73 U/L (ref 38–126)
Anion gap: 8 (ref 5–15)
BUN: 28 mg/dL — ABNORMAL HIGH (ref 8–23)
CO2: 28 mmol/L (ref 22–32)
Calcium: 9.8 mg/dL (ref 8.9–10.3)
Chloride: 104 mmol/L (ref 98–111)
Creatinine: 1.15 mg/dL — ABNORMAL HIGH (ref 0.44–1.00)
GFR, Estimated: 51 mL/min — ABNORMAL LOW (ref >60)
Glucose, Bld: 95 mg/dL (ref 70–99)
Potassium: 3.5 mmol/L (ref 3.5–5.1)
Sodium: 140 mmol/L (ref 135–145)
Total Bilirubin: 0.6 mg/dL (ref 0.3–1.2)
Total Protein: 7 g/dL (ref 6.5–8.1)

## 2020-08-08 LAB — CBC WITH DIFFERENTIAL (CANCER CENTER ONLY)
Abs Immature Granulocytes: 0.06 10*3/uL (ref 0.00–0.07)
Basophils Absolute: 0.1 10*3/uL (ref 0.0–0.1)
Basophils Relative: 1 %
Eosinophils Absolute: 0.2 10*3/uL (ref 0.0–0.5)
Eosinophils Relative: 2 %
HCT: 25.1 % — ABNORMAL LOW (ref 36.0–46.0)
Hemoglobin: 7.8 g/dL — ABNORMAL LOW (ref 12.0–15.0)
Immature Granulocytes: 1 %
Lymphocytes Relative: 24 %
Lymphs Abs: 1.6 10*3/uL (ref 0.7–4.0)
MCH: 24.9 pg — ABNORMAL LOW (ref 26.0–34.0)
MCHC: 31.1 g/dL (ref 30.0–36.0)
MCV: 80.2 fL (ref 80.0–100.0)
Monocytes Absolute: 0.7 10*3/uL (ref 0.1–1.0)
Monocytes Relative: 11 %
Neutro Abs: 4.1 10*3/uL (ref 1.7–7.7)
Neutrophils Relative %: 61 %
Platelet Count: 358 10*3/uL (ref 150–400)
RBC: 3.13 MIL/uL — ABNORMAL LOW (ref 3.87–5.11)
RDW: 19.4 % — ABNORMAL HIGH (ref 11.5–15.5)
WBC Count: 6.7 10*3/uL (ref 4.0–10.5)
nRBC: 0 % (ref 0.0–0.2)

## 2020-08-08 LAB — IRON AND TIBC
Iron: 69 ug/dL (ref 41–142)
Saturation Ratios: 32 % (ref 21–57)
TIBC: 213 ug/dL — ABNORMAL LOW (ref 236–444)
UIBC: 144 ug/dL (ref 120–384)

## 2020-08-08 LAB — SAVE SMEAR(SSMR), FOR PROVIDER SLIDE REVIEW

## 2020-08-08 LAB — RETICULOCYTES
Immature Retic Fract: 16.1 % — ABNORMAL HIGH (ref 2.3–15.9)
RBC.: 3.14 MIL/uL — ABNORMAL LOW (ref 3.87–5.11)
Retic Count, Absolute: 29.8 10*3/uL (ref 19.0–186.0)
Retic Ct Pct: 1 % (ref 0.4–3.1)

## 2020-08-08 LAB — FERRITIN: Ferritin: 275 ng/mL (ref 11–307)

## 2020-08-08 NOTE — Progress Notes (Signed)
[name]  It is [employer].  Hematology and Oncology Follow Up Visit  Alexandra [name] [id number] [birth year] 70 y.o. 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 is back for followup.  So far, everything is going pretty well with her.  She really has had no complaints since we last saw her.  She has had no problems with bleeding or bruising.  There is no fever.  She has had no problems with cough or shortness of breath.    She did show me pictures of a nephew's graduation from [employer].  She also showed me.  The graduation by niece who went to [employer].  She has had no problems with rashes.  Is no change in bowel or bladder habits.  She has had no leg swelling.  Her appetite has been quite good.  Overall, her performance status is ECOG 1.     Medications:  Current Outpatient Medications:    amLODipine (NORVASC) 10 MG tablet, TAKE 1 TABLET BY MOUTH EVERY DAY, Disp: 90 tablet, Rfl: 1   anagrelide (AGRYLIN) 1 MG capsule, TAKE 5 CAPSULES BY MOUTH DAILY, Disp: 450 capsule, Rfl: 3   aspirin 81 MG tablet, Take 81 mg by mouth daily., Disp: , Rfl:    Fe Fum-FePoly-Vit C-Vit B3 (INTEGRA) 62.5-62.5-40-3 MG CAPS, TAKE 1 CAPSULE BY MOUTH DAILY, Disp: 30 capsule, Rfl: 8   folic acid (FOLVITE) 1 MG tablet, Take 2 tablets by mouth once daily, Disp: 60 tablet, Rfl: 0   lisinopril (ZESTRIL) 20 MG tablet, TAKE 2 TABLETS BY MOUTH EVERY DAY, Disp: 180 tablet, Rfl: 3   Nebivolol HCl 20 MG TABS, TAKE 1 TABLET(20 MG) BY MOUTH DAILY, Disp: 90 tablet, Rfl: 1   Vitamin D, Ergocalciferol, (DRISDOL) 1.25 MG (50000 UNIT) CAPS capsule, TAKE 1 CAPSULE BY MOUTH 1 TIME A WEEK, Disp: 12 capsule, Rfl: 2    glucose blood (ONE TOUCH ULTRA TEST) test strip, Use to test blood sugar 1 times daily. **PT NEEDS FOLLOW UP APPT FOR FURTHER REFILLS** (Patient not taking: Reported on 08/08/2020), Disp: 100 each, Rfl: 0   ONETOUCH DELICA LANCETS 77O MISC, Use to test blood sugar 1 time daily. **PT NEEDS FOLLOW UP APPT FOR FURTHER REFILLS** (Patient not taking: Reported on 08/08/2020), Disp: 100 each, Rfl: 1  Allergies: No Known Allergies  Past Medical History, Surgical history, Social history, and Family History were reviewed and updated.  Review of Systems: Review of Systems  All other systems reviewed and are negative.Marland Kitchen  Physical Exam:  weight is 125 lb (56.7 kg). Her oral temperature is 98 F (36.7 C). Her blood pressure is 151/80 (abnormal) and her pulse is 79. Her respiration is 20 and oxygen saturation is 100%. =jh  Physical Exam Vitals reviewed.  HENT:     Head: Normocephalic and atraumatic.  Eyes:     Pupils: Pupils are equal, round, and reactive to light.  Cardiovascular:     Rate and Rhythm: Normal rate and regular rhythm.     Heart sounds: Normal heart sounds.  Pulmonary:     Effort: Pulmonary effort is normal.     Breath sounds: Normal breath sounds.  Abdominal:  General: Bowel sounds are normal.     Palpations: Abdomen is soft.  Musculoskeletal:        General: No tenderness or deformity. Normal range of motion.     Cervical back: Normal range of motion.  Lymphadenopathy:     Cervical: No cervical adenopathy.  Skin:    General: Skin is warm and dry.     Findings: No erythema or rash.  Neurological:     Mental Status: She is alert and oriented to person, place, and time.  Psychiatric:        Behavior: Behavior normal.        Thought Content: Thought content normal.        Judgment: Judgment normal.     Lab Results  Component Value Date   WBC 6.7 08/08/2020   HGB 7.8 (L) 08/08/2020   HCT 25.1 (L) 08/08/2020   MCV 80.2 08/08/2020   PLT 358 08/08/2020     Chemistry       Component Value Date/Time   NA 140 08/08/2020 1021   NA 144 02/02/2017 1158   NA 140 07/24/2016 1007   K 3.5 08/08/2020 1021   K 3.7 02/02/2017 1158   K 3.6 07/24/2016 1007   CL 104 08/08/2020 1021   CL 103 02/02/2017 1158   CO2 28 08/08/2020 1021   CO2 29 02/02/2017 1158   CO2 26 07/24/2016 1007   BUN 28 (H) 08/08/2020 1021   BUN 20 02/02/2017 1158   BUN 30.3 (H) 07/24/2016 1007   CREATININE 1.15 (H) 08/08/2020 1021   CREATININE 0.9 02/02/2017 1158   CREATININE 1.1 07/24/2016 1007   GLU 129 02/02/2017 0000      Component Value Date/Time   CALCIUM 9.8 08/08/2020 1021   CALCIUM 9.0 02/02/2017 1158   CALCIUM 9.3 07/24/2016 1007   ALKPHOS 73 08/08/2020 1021   ALKPHOS 110 (H) 02/02/2017 1158   ALKPHOS 104 07/24/2016 1007   AST 18 08/08/2020 1021   AST 13 07/24/2016 1007   ALT 13 08/08/2020 1021   ALT 26 02/02/2017 1158   ALT 15 07/24/2016 1007   BILITOT 0.6 08/08/2020 1021   BILITOT 0.55 07/24/2016 1007      Impression and Plan: Alexandra [name] is 70 year old [name]. She has essential thrombocythemia. She actually is Calreticulin positive.  I am very encouraged by the fact that her platelet count is still relatively normal.  However, I am bothered by the fact that she is more anemic.  She certainly is not symptomatic from this.  As such, we will just follow this for right now.  I talked her in the past about using Aranesp.  She just does not want to use this right now.  I understand that completely.  I would like to get her back in 6 weeks for follow-up.  We will see what her hemoglobin is at that time.      [name], MD 6/9/202211:47 AM

## 2020-08-09 ENCOUNTER — Other Ambulatory Visit: Payer: Self-pay | Admitting: Hematology & Oncology

## 2020-08-09 DIAGNOSIS — D473 Essential (hemorrhagic) thrombocythemia: Secondary | ICD-10-CM

## 2020-08-09 DIAGNOSIS — D508 Other iron deficiency anemias: Secondary | ICD-10-CM

## 2020-09-02 ENCOUNTER — Other Ambulatory Visit: Payer: Self-pay | Admitting: Internal Medicine

## 2020-09-12 ENCOUNTER — Other Ambulatory Visit: Payer: Self-pay | Admitting: Hematology & Oncology

## 2020-09-12 DIAGNOSIS — D508 Other iron deficiency anemias: Secondary | ICD-10-CM

## 2020-09-12 DIAGNOSIS — D473 Essential (hemorrhagic) thrombocythemia: Secondary | ICD-10-CM

## 2020-09-13 ENCOUNTER — Encounter: Payer: Self-pay | Admitting: Family

## 2020-09-19 ENCOUNTER — Encounter: Payer: Self-pay | Admitting: Hematology & Oncology

## 2020-09-19 ENCOUNTER — Other Ambulatory Visit: Payer: Self-pay

## 2020-09-19 ENCOUNTER — Inpatient Hospital Stay (HOSPITAL_BASED_OUTPATIENT_CLINIC_OR_DEPARTMENT_OTHER): Payer: Medicare PPO | Admitting: Hematology & Oncology

## 2020-09-19 ENCOUNTER — Telehealth: Payer: Self-pay

## 2020-09-19 ENCOUNTER — Inpatient Hospital Stay: Payer: Medicare PPO | Attending: Hematology & Oncology

## 2020-09-19 VITALS — BP 141/75 | HR 87 | Temp 98.4°F | Resp 16 | Wt 128.0 lb

## 2020-09-19 DIAGNOSIS — N189 Chronic kidney disease, unspecified: Secondary | ICD-10-CM | POA: Insufficient documentation

## 2020-09-19 DIAGNOSIS — D631 Anemia in chronic kidney disease: Secondary | ICD-10-CM | POA: Diagnosis not present

## 2020-09-19 DIAGNOSIS — D473 Essential (hemorrhagic) thrombocythemia: Secondary | ICD-10-CM | POA: Diagnosis not present

## 2020-09-19 DIAGNOSIS — Z79899 Other long term (current) drug therapy: Secondary | ICD-10-CM | POA: Diagnosis not present

## 2020-09-19 DIAGNOSIS — D471 Chronic myeloproliferative disease: Secondary | ICD-10-CM

## 2020-09-19 DIAGNOSIS — Z7982 Long term (current) use of aspirin: Secondary | ICD-10-CM | POA: Insufficient documentation

## 2020-09-19 LAB — CBC WITH DIFFERENTIAL (CANCER CENTER ONLY)
Abs Immature Granulocytes: 0.03 10*3/uL (ref 0.00–0.07)
Basophils Absolute: 0 10*3/uL (ref 0.0–0.1)
Basophils Relative: 1 %
Eosinophils Absolute: 0.2 10*3/uL (ref 0.0–0.5)
Eosinophils Relative: 2 %
HCT: 26 % — ABNORMAL LOW (ref 36.0–46.0)
Hemoglobin: 8 g/dL — ABNORMAL LOW (ref 12.0–15.0)
Immature Granulocytes: 1 %
Lymphocytes Relative: 23 %
Lymphs Abs: 1.4 10*3/uL (ref 0.7–4.0)
MCH: 24.9 pg — ABNORMAL LOW (ref 26.0–34.0)
MCHC: 30.8 g/dL (ref 30.0–36.0)
MCV: 81 fL (ref 80.0–100.0)
Monocytes Absolute: 0.7 10*3/uL (ref 0.1–1.0)
Monocytes Relative: 12 %
Neutro Abs: 3.9 10*3/uL (ref 1.7–7.7)
Neutrophils Relative %: 61 %
Platelet Count: 363 10*3/uL (ref 150–400)
RBC: 3.21 MIL/uL — ABNORMAL LOW (ref 3.87–5.11)
RDW: 19.9 % — ABNORMAL HIGH (ref 11.5–15.5)
WBC Count: 6.3 10*3/uL (ref 4.0–10.5)
nRBC: 0 % (ref 0.0–0.2)

## 2020-09-19 LAB — CMP (CANCER CENTER ONLY)
ALT: 13 U/L (ref 0–44)
AST: 15 U/L (ref 15–41)
Albumin: 4.2 g/dL (ref 3.5–5.0)
Alkaline Phosphatase: 74 U/L (ref 38–126)
Anion gap: 6 (ref 5–15)
BUN: 27 mg/dL — ABNORMAL HIGH (ref 8–23)
CO2: 30 mmol/L (ref 22–32)
Calcium: 9.6 mg/dL (ref 8.9–10.3)
Chloride: 105 mmol/L (ref 98–111)
Creatinine: 1.04 mg/dL — ABNORMAL HIGH (ref 0.44–1.00)
GFR, Estimated: 58 mL/min — ABNORMAL LOW (ref >60)
Glucose, Bld: 101 mg/dL — ABNORMAL HIGH (ref 70–99)
Potassium: 3.8 mmol/L (ref 3.5–5.1)
Sodium: 141 mmol/L (ref 135–145)
Total Bilirubin: 0.7 mg/dL (ref 0.3–1.2)
Total Protein: 7.4 g/dL (ref 6.5–8.1)

## 2020-09-19 LAB — LACTATE DEHYDROGENASE: LDH: 209 U/L — ABNORMAL HIGH (ref 98–192)

## 2020-09-19 LAB — SAVE SMEAR(SSMR), FOR PROVIDER SLIDE REVIEW

## 2020-09-19 NOTE — Telephone Encounter (Signed)
Appts made and printed for pt per 09/19/20 los  Alexandra [name]

## 2020-09-19 NOTE — Progress Notes (Signed)
[name]  [employer] is [employer].  Hematology and Oncology Follow Up Visit  Alexandra [name] [id number] 1950-09-10 70 y.o. 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 is back for followup.  She is doing quite well.  She is can be heading back down to [place] for a family gathering this weekend.  She has had no problems with respect to the anagrelide and the aspirin.  She is doing well with these..  She also is taking folic acid.  She has had no problems with bleeding or bruising.  There is no problems with bowels or bladder.  She has had no diarrhea.  Alexandra [name] is no cough or shortness of breath.  Her appetite has been doing quite well.  She has had no headache.  There is been no pain in her hands or feet.  Overall, I would say her performance status is probably ECOG 0.     Medications:  Current Outpatient Medications:    amLODipine (NORVASC) 10 MG tablet, TAKE 1 TABLET BY MOUTH EVERY DAY, Disp: 90 tablet, Rfl: 0   anagrelide (AGRYLIN) 1 MG capsule, TAKE 5 CAPSULES BY MOUTH DAILY, Disp: 450 capsule, Rfl: 3   aspirin 81 MG tablet, Take 81 mg by mouth daily., Disp: , Rfl:    Fe Fum-FePoly-Vit C-Vit B3 (INTEGRA) 62.5-62.5-40-3 MG CAPS, TAKE 1 CAPSULE BY MOUTH DAILY, Disp: 30 capsule, Rfl: 8   folic acid (FOLVITE) 1 MG tablet, Take 2 tablets by mouth once daily, Disp: 60 tablet, Rfl: 0   glucose blood (ONE TOUCH ULTRA TEST) test strip, Use to test blood sugar 1 times daily. **PT NEEDS FOLLOW UP APPT FOR FURTHER REFILLS** (Patient not taking: Reported on 08/08/2020), Disp: 100 each, Rfl: 0   lisinopril (ZESTRIL) 20 MG tablet, TAKE 2 TABLETS BY MOUTH EVERY DAY, Disp: 180 tablet, Rfl: 3    Nebivolol HCl 20 MG TABS, TAKE 1 TABLET(20 MG) BY MOUTH DAILY, Disp: 90 tablet, Rfl: 1   ONETOUCH DELICA LANCETS 96E MISC, Use to test blood sugar 1 time daily. **PT NEEDS FOLLOW UP APPT FOR FURTHER REFILLS** (Patient not taking: Reported on 08/08/2020), Disp: 100 each, Rfl: 1   Vitamin D, Ergocalciferol, (DRISDOL) 1.25 MG (50000 UNIT) CAPS capsule, TAKE 1 CAPSULE BY MOUTH 1 TIME A WEEK, Disp: 12 capsule, Rfl: 2  Allergies: No Known Allergies  Past Medical History, Surgical history, Social history, and Family History were reviewed and updated.  Review of Systems: Review of Systems  All other systems reviewed and are negative.Marland Kitchen  Physical Exam:  weight is 128 lb (58.1 kg). Her oral temperature is 98.4 F (36.9 C). Her blood pressure is 141/75 (abnormal) and her pulse is 87. Her respiration is 16 and oxygen saturation is 100%. =jh  Physical Exam Vitals reviewed.  HENT:     Head: Normocephalic and atraumatic.  Eyes:     Pupils: Pupils are equal, round, and reactive to light.  Cardiovascular:     Rate and Rhythm: Normal rate and regular rhythm.     Heart sounds: Normal heart sounds.  Pulmonary:     Effort: Pulmonary effort is normal.     Breath sounds: Normal breath sounds.  Abdominal:     General: Bowel sounds are normal.     Palpations: Abdomen is soft.  Musculoskeletal:        General: No tenderness or deformity. Normal range of motion.     Cervical back: Normal range of motion.  Lymphadenopathy:     Cervical: No cervical adenopathy.  Skin:    General: Skin is warm and dry.     Findings: No erythema or rash.  Neurological:     Mental Status: She is alert and oriented to person, place, and time.  Psychiatric:        Behavior: Behavior normal.        Thought Content: Thought content normal.        Judgment: Judgment normal.     Lab Results  Component Value Date   WBC 6.3 09/19/2020   HGB 8.0 (L) 09/19/2020   HCT 26.0 (L) 09/19/2020   MCV 81.0 09/19/2020   PLT 363  09/19/2020     Chemistry      Component Value Date/Time   NA 141 09/19/2020 1158   NA 144 02/02/2017 1158   NA 140 07/24/2016 1007   K 3.8 09/19/2020 1158   K 3.7 02/02/2017 1158   K 3.6 07/24/2016 1007   CL 105 09/19/2020 1158   CL 103 02/02/2017 1158   CO2 30 09/19/2020 1158   CO2 29 02/02/2017 1158   CO2 26 07/24/2016 1007   BUN 27 (H) 09/19/2020 1158   BUN 20 02/02/2017 1158   BUN 30.3 (H) 07/24/2016 1007   CREATININE 1.04 (H) 09/19/2020 1158   CREATININE 0.9 02/02/2017 1158   CREATININE 1.1 07/24/2016 1007   GLU 129 02/02/2017 0000      Component Value Date/Time   CALCIUM 9.6 09/19/2020 1158   CALCIUM 9.0 02/02/2017 1158   CALCIUM 9.3 07/24/2016 1007   ALKPHOS 74 09/19/2020 1158   ALKPHOS 110 (H) 02/02/2017 1158   ALKPHOS 104 07/24/2016 1007   AST 15 09/19/2020 1158   AST 13 07/24/2016 1007   ALT 13 09/19/2020 1158   ALT 26 02/02/2017 1158   ALT 15 07/24/2016 1007   BILITOT 0.7 09/19/2020 1158   BILITOT 0.55 07/24/2016 1007      Impression and Plan: Alexandra [name] is 70 year old [name]. She has essential thrombocythemia. She actually is Calreticulin positive.  I am very encouraged by the fact that her platelet count is still relatively normal.  Her hemoglobin is holding steady so happy about that.  She does not wish to have any Aranesp.  I talked to her about this in the past.  Recommended that her iron studies looked okay when we last saw her.  Her ferritin was 275 with an iron saturation of 32%.  I think we can now move her appointments to 2 months.  She really likes this.  Again she is very active and does like to travel.  I am so happy that her quality of life is doing so well right now.      [name], MD 7/21/202212:57 PM

## 2020-09-20 LAB — IRON AND TIBC
Iron: 75 ug/dL (ref 28–170)
Saturation Ratios: 29 % (ref 10.4–31.8)
TIBC: 255 ug/dL (ref 250–450)
UIBC: 180 ug/dL

## 2020-09-20 LAB — FERRITIN: Ferritin: 227 ng/mL (ref 11–307)

## 2020-10-08 ENCOUNTER — Other Ambulatory Visit: Payer: Self-pay | Admitting: Internal Medicine

## 2020-10-08 DIAGNOSIS — Z1231 Encounter for screening mammogram for malignant neoplasm of breast: Secondary | ICD-10-CM

## 2020-10-09 ENCOUNTER — Other Ambulatory Visit: Payer: Self-pay | Admitting: Hematology & Oncology

## 2020-10-09 DIAGNOSIS — D508 Other iron deficiency anemias: Secondary | ICD-10-CM

## 2020-10-09 DIAGNOSIS — D473 Essential (hemorrhagic) thrombocythemia: Secondary | ICD-10-CM

## 2020-10-12 ENCOUNTER — Other Ambulatory Visit: Payer: Self-pay | Admitting: Internal Medicine

## 2020-10-21 ENCOUNTER — Other Ambulatory Visit: Payer: Self-pay

## 2020-10-21 ENCOUNTER — Ambulatory Visit
Admission: RE | Admit: 2020-10-21 | Discharge: 2020-10-21 | Disposition: A | Discharge status: Home / Self Care | Payer: Medicare PPO | Source: Ambulatory Visit | Attending: Internal Medicine | Admitting: Internal Medicine

## 2020-10-21 DIAGNOSIS — Z1231 Encounter for screening mammogram for malignant neoplasm of breast: Secondary | ICD-10-CM

## 2020-10-29 DIAGNOSIS — Z20822 Contact with and (suspected) exposure to covid-19: Secondary | ICD-10-CM | POA: Diagnosis not present

## 2020-11-19 ENCOUNTER — Other Ambulatory Visit: Payer: Self-pay

## 2020-11-19 ENCOUNTER — Inpatient Hospital Stay (HOSPITAL_BASED_OUTPATIENT_CLINIC_OR_DEPARTMENT_OTHER): Payer: Medicare PPO | Admitting: Hematology & Oncology

## 2020-11-19 ENCOUNTER — Encounter: Payer: Self-pay | Admitting: Hematology & Oncology

## 2020-11-19 ENCOUNTER — Telehealth: Payer: Self-pay

## 2020-11-19 ENCOUNTER — Inpatient Hospital Stay: Payer: Medicare PPO | Attending: Hematology & Oncology

## 2020-11-19 VITALS — BP 137/81 | HR 81 | Temp 97.5°F | Resp 18 | Wt 126.2 lb

## 2020-11-19 DIAGNOSIS — D473 Essential (hemorrhagic) thrombocythemia: Secondary | ICD-10-CM | POA: Insufficient documentation

## 2020-11-19 DIAGNOSIS — D631 Anemia in chronic kidney disease: Secondary | ICD-10-CM | POA: Insufficient documentation

## 2020-11-19 DIAGNOSIS — D500 Iron deficiency anemia secondary to blood loss (chronic): Secondary | ICD-10-CM

## 2020-11-19 DIAGNOSIS — D471 Chronic myeloproliferative disease: Secondary | ICD-10-CM

## 2020-11-19 DIAGNOSIS — D75839 Thrombocytosis, unspecified: Secondary | ICD-10-CM | POA: Insufficient documentation

## 2020-11-19 DIAGNOSIS — N189 Chronic kidney disease, unspecified: Secondary | ICD-10-CM | POA: Diagnosis not present

## 2020-11-19 DIAGNOSIS — Z79899 Other long term (current) drug therapy: Secondary | ICD-10-CM | POA: Insufficient documentation

## 2020-11-19 LAB — CMP (CANCER CENTER ONLY)
ALT: 17 U/L (ref 0–44)
AST: 14 U/L — ABNORMAL LOW (ref 15–41)
Albumin: 4.1 g/dL (ref 3.5–5.0)
Alkaline Phosphatase: 76 U/L (ref 38–126)
Anion gap: 7 (ref 5–15)
BUN: 27 mg/dL — ABNORMAL HIGH (ref 8–23)
CO2: 29 mmol/L (ref 22–32)
Calcium: 9.3 mg/dL (ref 8.9–10.3)
Chloride: 106 mmol/L (ref 98–111)
Creatinine: 1.13 mg/dL — ABNORMAL HIGH (ref 0.44–1.00)
GFR, Estimated: 52 mL/min — ABNORMAL LOW
Glucose, Bld: 106 mg/dL — ABNORMAL HIGH (ref 70–99)
Potassium: 3.9 mmol/L (ref 3.5–5.1)
Sodium: 142 mmol/L (ref 135–145)
Total Bilirubin: 0.7 mg/dL (ref 0.3–1.2)
Total Protein: 7.2 g/dL (ref 6.5–8.1)

## 2020-11-19 LAB — CBC WITH DIFFERENTIAL (CANCER CENTER ONLY)
Abs Immature Granulocytes: 0.04 K/uL (ref 0.00–0.07)
Basophils Absolute: 0.1 K/uL (ref 0.0–0.1)
Basophils Relative: 1 %
Eosinophils Absolute: 0.2 K/uL (ref 0.0–0.5)
Eosinophils Relative: 3 %
HCT: 24.8 % — ABNORMAL LOW (ref 36.0–46.0)
Hemoglobin: 7.8 g/dL — ABNORMAL LOW (ref 12.0–15.0)
Immature Granulocytes: 1 %
Lymphocytes Relative: 23 %
Lymphs Abs: 1.6 K/uL (ref 0.7–4.0)
MCH: 25.5 pg — ABNORMAL LOW (ref 26.0–34.0)
MCHC: 31.5 g/dL (ref 30.0–36.0)
MCV: 81 fL (ref 80.0–100.0)
Monocytes Absolute: 0.8 K/uL (ref 0.1–1.0)
Monocytes Relative: 11 %
Neutro Abs: 4.4 K/uL (ref 1.7–7.7)
Neutrophils Relative %: 61 %
Platelet Count: 442 K/uL — ABNORMAL HIGH (ref 150–400)
RBC: 3.06 MIL/uL — ABNORMAL LOW (ref 3.87–5.11)
RDW: 19.4 % — ABNORMAL HIGH (ref 11.5–15.5)
WBC Count: 7.1 K/uL (ref 4.0–10.5)
nRBC: 0 % (ref 0.0–0.2)

## 2020-11-19 LAB — IRON AND TIBC
Iron: 72 ug/dL (ref 41–142)
Saturation Ratios: 33 % (ref 21–57)
TIBC: 215 ug/dL — ABNORMAL LOW (ref 236–444)
UIBC: 143 ug/dL (ref 120–384)

## 2020-11-19 LAB — FERRITIN: Ferritin: 252 ng/mL (ref 11–307)

## 2020-11-19 LAB — RETICULOCYTES
Immature Retic Fract: 19.4 % — ABNORMAL HIGH (ref 2.3–15.9)
RBC.: 3.08 MIL/uL — ABNORMAL LOW (ref 3.87–5.11)
Retic Count, Absolute: 40 10*3/uL (ref 19.0–186.0)
Retic Ct Pct: 1.3 % (ref 0.4–3.1)

## 2020-11-19 LAB — SAVE SMEAR(SSMR), FOR PROVIDER SLIDE REVIEW

## 2020-11-19 LAB — LACTATE DEHYDROGENASE: LDH: 197 U/L — ABNORMAL HIGH (ref 98–192)

## 2020-11-19 NOTE — Telephone Encounter (Signed)
Appts made and printed for pt per 11/19/20 los  Alexandra [name] 

## 2020-11-19 NOTE — Progress Notes (Signed)
[name]  [employer] is a [employer].  Hematology and Oncology Follow Up Visit  AYAAN RINGLE [id number] [birth year] 70 y.o. 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  Donati is back for followup.  We last saw her back in July.  Since then, she has been doing pretty well.  She is getting ready to go down to her 50th high school reunion in [place], [place].  I am sure she will have a wonderful time.  She had no problems with the anagrelide.  She just will not take Aranesp.  We tried to talk to her about Aranesp but she is just reluctant to do this.  She has had no problems with bleeding.  There is been no change in bowel or bladder habits.  She has had no cough.  She has had no rashes.  There has been no headache.  Her iron studies back in July showed a ferritin of 227 with iron saturation of 29%.  There is been no nausea or vomiting.  Overall, I would say performance status is ECOG 1.  Medications:  Current Outpatient Medications:    amLODipine (NORVASC) 10 MG tablet, TAKE 1 TABLET BY MOUTH EVERY DAY, Disp: 90 tablet, Rfl: 0   anagrelide (AGRYLIN) 1 MG capsule, TAKE 5 CAPSULES BY MOUTH DAILY, Disp: 450 capsule, Rfl: 3   aspirin 81 MG tablet, Take 81 mg by mouth daily., Disp: , Rfl:    Fe Fum-FePoly-Vit C-Vit B3 (INTEGRA) 62.5-62.5-40-3 MG CAPS, TAKE 1 CAPSULE BY MOUTH DAILY, Disp: 30 capsule, Rfl: 8   folic acid (FOLVITE) 1 MG tablet, Take 2 tablets by mouth once daily, Disp: 60 tablet, Rfl: 0   glucose blood (ONE TOUCH ULTRA TEST) test strip, Use to test blood sugar 1 times daily. **PT NEEDS FOLLOW UP APPT FOR FURTHER REFILLS**, Disp: 100 each, Rfl: 0   lisinopril (ZESTRIL) 20 MG tablet,  TAKE 2 TABLETS BY MOUTH EVERY DAY, Disp: 180 tablet, Rfl: 3   Nebivolol HCl 20 MG TABS, TAKE 1 TABLET(20 MG) BY MOUTH DAILY, Disp: 90 tablet, Rfl: 0   Vitamin D, Ergocalciferol, (DRISDOL) 1.25 MG (50000 UNIT) CAPS capsule, TAKE 1 CAPSULE BY MOUTH 1 TIME A WEEK, Disp: 12 capsule, Rfl: 2   ONETOUCH DELICA LANCETS 20B MISC, Use to test blood sugar 1 time daily. **PT NEEDS FOLLOW UP APPT FOR FURTHER REFILLS** (Patient not taking: No sig reported), Disp: 100 each, Rfl: 1  Allergies: No Known Allergies  Past Medical History, Surgical history, Social history, and Family History were reviewed and updated.  Review of Systems: Review of Systems  All other systems reviewed and are negative.Marland Kitchen  Physical Exam:  weight is 126 lb 4 oz (57.3 kg). Her oral temperature is 97.5 F (36.4 C) (abnormal). Her blood pressure is 137/81 and her pulse is 81. Her respiration is 18 and oxygen saturation is 100%. =jh  Physical Exam Vitals reviewed.  HENT:     Head: Normocephalic and atraumatic.  Eyes:     Pupils: Pupils are equal, round, and reactive to light.  Cardiovascular:     Rate and Rhythm: Normal rate and regular rhythm.     Heart sounds: Normal heart sounds.  Pulmonary:     Effort: Pulmonary effort is normal.     Breath sounds: Normal breath sounds.  Abdominal:     General: Bowel sounds are normal.     Palpations: Abdomen is soft.  Musculoskeletal:        General: No tenderness or deformity. Normal range of motion.     Cervical back: Normal range of motion.  Lymphadenopathy:     Cervical: No cervical adenopathy.  Skin:    General: Skin is warm and dry.     Findings: No erythema or rash.  Neurological:     Mental Status: She is alert and oriented to person, place, and time.  Psychiatric:        Behavior: Behavior normal.        Thought Content: Thought content normal.        Judgment: Judgment normal.     Lab Results  Component Value Date   WBC 7.1 11/19/2020   HGB 7.8 (L) 11/19/2020    HCT 24.8 (L) 11/19/2020   MCV 81.0 11/19/2020   PLT 442 (H) 11/19/2020     Chemistry      Component Value Date/Time   NA 142 11/19/2020 1013   NA 144 02/02/2017 1158   NA 140 07/24/2016 1007   K 3.9 11/19/2020 1013   K 3.7 02/02/2017 1158   K 3.6 07/24/2016 1007   CL 106 11/19/2020 1013   CL 103 02/02/2017 1158   CO2 29 11/19/2020 1013   CO2 29 02/02/2017 1158   CO2 26 07/24/2016 1007   BUN 27 (H) 11/19/2020 1013   BUN 20 02/02/2017 1158   BUN 30.3 (H) 07/24/2016 1007   CREATININE 1.13 (H) 11/19/2020 1013   CREATININE 0.9 02/02/2017 1158   CREATININE 1.1 07/24/2016 1007   GLU 129 02/02/2017 0000      Component Value Date/Time   CALCIUM 9.3 11/19/2020 1013   CALCIUM 9.0 02/02/2017 1158   CALCIUM 9.3 07/24/2016 1007   ALKPHOS 76 11/19/2020 1013   ALKPHOS 110 (H) 02/02/2017 1158   ALKPHOS 104 07/24/2016 1007   AST 14 (L) 11/19/2020 1013   AST 13 07/24/2016 1007   ALT 17 11/19/2020 1013   ALT 26 02/02/2017 1158   ALT 15 07/24/2016 1007   BILITOT 0.7 11/19/2020 1013   BILITOT 0.55 07/24/2016 1007      Impression and Plan: Ms. Dollinger is 70 year old [name]. She has essential thrombocythemia. She actually is Calreticulin positive.  I noted that her platelet count is up a little bit.  She does tend to fluctuate.  Her hemoglobin is always quite low but she is not symptomatic with this.  We will see what her iron studies have to show.  I looked at her blood smear under the microscope.  Everything looks pretty stable from my point of view.  I do not see any immature myeloid forms.  She had a few nucleated red blood cells.  I saw some anisocytosis and poikilocytosis.  We will plan to get her back in about 5 or 6 weeks.  I want to try to arrange her appointments so that we can try to get her through most of the holidays.      [name], MD 9/20/202212:12 PM

## 2020-11-22 ENCOUNTER — Other Ambulatory Visit: Payer: Self-pay | Admitting: Hematology & Oncology

## 2020-11-22 DIAGNOSIS — D508 Other iron deficiency anemias: Secondary | ICD-10-CM

## 2020-11-22 DIAGNOSIS — D473 Essential (hemorrhagic) thrombocythemia: Secondary | ICD-10-CM

## 2020-11-26 ENCOUNTER — Other Ambulatory Visit: Payer: Self-pay | Admitting: Hematology & Oncology

## 2020-11-26 DIAGNOSIS — E559 Vitamin D deficiency, unspecified: Secondary | ICD-10-CM

## 2020-12-01 ENCOUNTER — Other Ambulatory Visit: Payer: Self-pay | Admitting: Internal Medicine

## 2020-12-17 ENCOUNTER — Telehealth: Payer: Self-pay | Admitting: Internal Medicine

## 2020-12-17 NOTE — Telephone Encounter (Signed)
Left message asking patient to call [phone]  Otila Kluver will be in late and need to change time of appointment

## 2020-12-18 ENCOUNTER — Other Ambulatory Visit: Payer: Self-pay | Admitting: Hematology & Oncology

## 2020-12-19 ENCOUNTER — Encounter: Payer: Self-pay | Admitting: Family

## 2020-12-20 ENCOUNTER — Other Ambulatory Visit: Payer: Self-pay | Admitting: Internal Medicine

## 2020-12-23 ENCOUNTER — Other Ambulatory Visit: Payer: Self-pay | Admitting: Hematology & Oncology

## 2020-12-23 DIAGNOSIS — D508 Other iron deficiency anemias: Secondary | ICD-10-CM

## 2020-12-23 DIAGNOSIS — D473 Essential (hemorrhagic) thrombocythemia: Secondary | ICD-10-CM

## 2020-12-24 ENCOUNTER — Encounter: Payer: Self-pay | Admitting: Family

## 2020-12-25 ENCOUNTER — Other Ambulatory Visit: Payer: Self-pay

## 2020-12-25 ENCOUNTER — Ambulatory Visit (INDEPENDENT_AMBULATORY_CARE_PROVIDER_SITE_OTHER): Payer: Medicare PPO

## 2020-12-25 DIAGNOSIS — Z0000 Encounter for general adult medical examination without abnormal findings: Secondary | ICD-10-CM | POA: Diagnosis not present

## 2020-12-25 NOTE — Progress Notes (Addendum)
Virtual Visit via Telephone Note  I connected with  Alexandra [name] on 12/25/20 at  9:30 AM EDT by telephone and verified that I am speaking with the correct person using two identifiers.  Medicare Annual Wellness visit completed telephonically due to Covid-19 pandemic.   Persons participating in this call: This Health Coach and this patient.   Location: Patient: home Provider: office   I discussed the limitations, risks, security and privacy concerns of performing an evaluation and management service by telephone and the availability of in person appointments. The patient expressed understanding and agreed to proceed.  Unable to perform video visit due to video visit attempted and failed and/or patient does not have video capability.   Some vital signs may be absent or patient reported.   Alexandra Brace, LPN   Subjective:   Alexandra [name] is a 70 y.o. female who presents for Medicare Annual (Subsequent) preventive examination.  Review of Systems     Cardiac Risk Factors include: advanced age (>33mn, >>80women);diabetes mellitus;hypertension     Objective:    There were no vitals filed for this visit. There is no height or weight on file to calculate BMI.  Advanced Directives 12/25/2020 11/19/2020 09/19/2020 08/08/2020 06/20/2020 04/22/2020 03/11/2020  Does Patient Have a Medical Advance Directive? _0  No No  Does patient want to make changes to medical advance directive? - No - Patient declined No - Patient declined - No - Patient declined No - Patient declined No - Patient declined  Would patient like information on creating a medical advance directive? Yes (MAU/Ambulatory/Procedural Areas - Information given) No - Patient declined - No - Patient declined No - Patient declined - No - Patient declined    Current Medications (verified) Outpatient Encounter Medications as of 12/25/2020  Medication Sig   amLODipine (NORVASC) 10 MG tablet TAKE 1 TABLET BY MOUTH EVERY  DAY   anagrelide (AGRYLIN) 1 MG capsule TAKE 5 CAPSULES BY MOUTH DAILY   aspirin 81 MG tablet Take 81 mg by mouth daily.   Fe Fum-FePoly-Vit C-Vit B3 (INTEGRA) 62.5-62.5-40-3 MG CAPS TAKE 1 CAPSULE BY MOUTH DAILY   folic acid (FOLVITE) 1 MG tablet Take 2 tablets by mouth once daily   glucose blood (ONE TOUCH ULTRA TEST) test strip Use to test blood sugar 1 times daily. **PT NEEDS FOLLOW UP APPT FOR FURTHER REFILLS**   lisinopril (ZESTRIL) 20 MG tablet TAKE 2 TABLETS BY MOUTH EVERY DAY   Nebivolol HCl 20 MG TABS TAKE 1 TABLET(20 MG) BY MOUTH DAILY   Nebivolol HCl 20 MG TABS TAKE 1 TABLET(20 MG) BY MOUTH DAILY   ONETOUCH DELICA LANCETS 369CMISC Use to test blood sugar 1 time daily. **PT NEEDS FOLLOW UP APPT FOR FURTHER REFILLS**   Vitamin D, Ergocalciferol, (DRISDOL) 1.25 MG (50000 UNIT) CAPS capsule TAKE 1 CAPSULE BY MOUTH 1 TIME A WEEK   No facility-administered encounter medications on file as of 12/25/2020.    Allergies (verified) Patient has no known allergies.   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   Goals of care, counseling/discussion 12/15/2019   Hypertension    echo    Iron deficiency anemia, unspecified 03/15/2013   [facility]iferative neoplasm ([facility] 12/15/2019   Thrombocytosis    Past Surgical History:  Procedure Laterality Date   BONE MARROW BIOPSY     ECTOPIC PREGNANCY SURGERY  1984   Family History  Problem  Relation Age of Onset   Heart disease Mother        died age 74   Kidney disease Mother        family hx   Heart attack Father 1       massive   Hypertension Sister    Hypertension Sister    Hypertension Sister    Hypertension Sister    Arthritis Other        family hx   Diabetes Other        family hx   Stroke Other         dialysis   Social History   Socioeconomic History   Marital status: Married    Spouse name: Not on file   Number of  children: Not on file   Years of education: Not on file   Highest education level: Not on file  Occupational History   Occupation: retired  Tobacco Use   Smoking status: Never   Smokeless tobacco: Never   Tobacco comments:    never used tobacco  Vaping Use   Vaping Use: Never used  Substance and Sexual Activity   Alcohol use: No    Alcohol/week: 0.0 standard drinks   Drug use: No   Sexual activity: Not on file  Other Topics Concern   Not on file  Social History Narrative   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   No tobacco no ethoh little caffiene .    Social Determinants of Health   Financial Resource Strain: Low Risk    Difficulty of Paying Living Expenses: Not hard at all  Food Insecurity: No Food Insecurity   Worried About Charity fundraiser in the Last Year: Never true   [place] in the Last Year: Never true  Transportation Needs: No Transportation Needs   Lack of Transportation (Medical): No   Lack of Transportation (Non-Medical): No  Physical Activity: Insufficiently Active   Days of Exercise per Week: 3 days   Minutes of Exercise per Session: 30 min  Stress: No Stress Concern Present   Feeling of Stress : Not at all  Social Connections: Moderately Integrated   Frequency of Communication with Friends and Family: Three times a week   Frequency of Social Gatherings with Friends and Family: Once a week   Attends Religious Services: More than 4 times per year   Active Member of [employer] or Organizations: No   Attends Music therapist: Never   Marital Status: Married    Tobacco Counseling Counseling given: Not Answered Tobacco comments: never used tobacco   Clinical Intake:  Pre-visit preparation completed: Yes  Pain : No/denies pain     Nutritional Risks: None Diabetes: Yes CBG done?: No Did pt. bring in CBG monitor from home?: No  How often do you need to have someone help you when you read  instructions, pamphlets, or other written materials from your doctor or pharmacy?: 1 - Never  Diabetic?Nutrition Risk Assessment:  Has the patient had any N/V/D within the last 2 months?  No  Does the patient have any non-healing wounds?  No  Has the patient had any unintentional weight loss or weight gain?  No   Diabetes:  Is the patient diabetic?  Yes  If diabetic, was a CBG obtained today?  No  Did the patient bring in their glucometer from home?  No  How often do you monitor your CBG's? N/A.   Financial Strains and Diabetes Management:  Are you having any financial strains with the device, your supplies or your medication? No .  Does the patient want to be seen by Chronic Care Management for management of their diabetes?  No  Would the patient like to be referred to a Nutritionist or for Diabetic Management?  No   Diabetic Exams:  Diabetic Eye Exam: Overdue for diabetic eye exam. Pt has been advised about the importance in completing this exam. Patient advised to call and schedule an eye exam. Diabetic Foot Exam: Overdue, Pt has been advised about the importance in completing this exam. Pt is scheduled for diabetic foot exam on next appt .   Interpreter Needed?: No  Information entered by :: Ruvim Risko, LPN   Activities of Daily Living In your present state of health, do you have any difficulty performing the following activities: 12/25/2020 12/26/2019  Hearing? N N  Vision? N N  Difficulty concentrating or making decisions? N N  Walking or climbing stairs? N N  Comment - at times  Dressing or bathing? N N  Doing errands, shopping? N N  Preparing Food and eating ? N N  Using the Toilet? N N  In the past six months, have you accidently leaked urine? N N  Do you have problems with loss of bowel control? N N  Managing your Medications? N N  Managing your Finances? N N  Housekeeping or managing your Housekeeping? N N  Some recent data might be hidden    Patient  Care Team: Panosh, Standley Brooking, MD as PCP - General [place] Olp Rudell Cobb, MD as Attending Physician (Hematology and Oncology) Philemon Kingdom, MD as Consulting Physician (Internal Medicine)  Indicate any recent Medical Services you may have received from other than [facility] providers in the past year (date may be approximate).     Assessment:   This is a routine wellness examination for Aavya.  Hearing/Vision screen Hearing Screening - Comments:: Pt denies any hearing issues  Vision Screening - Comments:: Pt follows up with Dr Katy Fitch for annual eye exams   Dietary issues and exercise activities discussed: Current Exercise Habits: Home exercise routine, Type of exercise: walking, Time (Minutes): 30, Frequency (Times/Week): 3, Weekly Exercise (Minutes/Week): 90   Goals Addressed             This Visit's Progress    Patient Stated       Walk a little more        Depression Screen PHQ 2/9 Scores 12/25/2020 03/20/2020 12/26/2019 10/18/2017 10/18/2017 05/21/2016 05/19/2016  PHQ - 2 Score 0 0 0 0 0 0 0  PHQ- 9 Score - - - - 0 - -    Fall Risk Fall Risk  12/25/2020 03/20/2020 12/26/2019 10/18/2017 05/21/2016  Falls in the past year? 0 0 0 No No  Number falls in past yr: 0 0 0 - -  Injury with Fall? 0 - 0 - -  Risk for fall due to : Impaired vision - - - -  Follow up Falls prevention discussed - Falls prevention discussed - -    FALL RISK PREVENTION PERTAINING TO THE HOME:  Any stairs in or around the home? No  If so, are there any without handrails? No  Home free of loose throw rugs in walkways, pet beds, electrical cords, etc? Yes  Adequate lighting in your home to reduce risk of falls? Yes   ASSISTIVE DEVICES UTILIZED  TO PREVENT FALLS:  Life alert? No  Use of a cane, walker or w/c? No  Grab bars in the bathroom? No  Shower chair or bench in shower? No  Elevated toilet seat or a handicapped toilet? No   TIMED UP AND GO:  Was the test performed? No .    Cognitive  Function: MMSE - Mini Mental State Exam 05/19/2016  Not completed: (No Data)     6CIT Screen 12/25/2020 12/26/2019  What Year? 0 points 0 points  What month? 0 points 0 points  What time? 0 points -  Count back from 20 0 points 0 points  Months in reverse 0 points 0 points  Repeat phrase 0 points 4 points  Total Score 0 -    Immunizations Immunization History  Administered Date(s) Administered   PFIZER(Purple Top)SARS-COV-2 Vaccination 05/25/2019, 06/19/2019, 10/09/2020   PPD Test 03/15/2017, 02/27/2020    TDAP status: Up to date  Flu Vaccine status: Due, Education has been provided regarding the importance of this vaccine. Advised may receive this vaccine at local pharmacy or Health Dept. Aware to provide a copy of the vaccination record if obtained from local pharmacy or Health Dept. Verbalized acceptance and understanding.  Pneumococcal vaccine status: Due, Education has been provided regarding the importance of this vaccine. Advised may receive this vaccine at local pharmacy or Health Dept. Aware to provide a copy of the vaccination record if obtained from local pharmacy or Health Dept. Verbalized acceptance and understanding.  Covid-19 vaccine status: Completed vaccines  Qualifies for Shingles Vaccine? Yes   Zostavax completed No   Shingrix Completed?: No.    Education has been provided regarding the importance of this vaccine. Patient has been advised to call insurance company to determine out of pocket expense if they have not yet received this vaccine. Advised may also receive vaccine at local pharmacy or Health Dept. Verbalized acceptance and understanding.  Screening Tests Health Maintenance  Topic Date Due   Zoster Vaccines- Shingrix (1 of 2) Never done   OPHTHALMOLOGY EXAM  03/02/2020   FOOT EXAM  03/12/2020   HEMOGLOBIN A1C  10/20/2020   COVID-19 Vaccine (4 - Booster for Pfizer series) 12/04/2020   COLONOSCOPY (Pts 45-9yr Insurance coverage will need to be  confirmed)  12/25/2020 (Originally 05/02/2019)   Hepatitis C Screening  12/25/2020 (Originally 05/07/1968)   INFLUENZA VACCINE  05/30/2021 (Originally 09/30/2020)   Pneumonia Vaccine 70 Years old (1 - PCV) 12/25/2021 (Originally 05/07/1956)   TETANUS/TDAP  02/26/2021   MAMMOGRAM  10/22/2022   DEXA SCAN  Completed   HPV VACCINES  Aged Out    Health Maintenance  Health Maintenance Due  Topic Date Due   Zoster Vaccines- Shingrix (1 of 2) Never done   OPHTHALMOLOGY EXAM  03/02/2020   FOOT EXAM  03/12/2020   HEMOGLOBIN A1C  10/20/2020   COVID-19 Vaccine (4 - Booster for P[place]) 12/04/2020    Colorectal cancer screening: Type of screening: Colonoscopy. Completed 05/02/14. Repeat every 5 years  Mammogram status: Completed 10/21/20. Repeat every year  Bone Density status: Completed 07/20/16. Results reflect: Bone density results: NORMAL. Repeat every 2 years.  Additional Screening:  Hepatitis C Screening: does qualify;  Vision Screening: Recommended annual ophthalmology exams for early detection of glaucoma and other disorders of the eye. Is the patient up to date with their annual eye exam?  Yes  Who is the provider or what is the name of the office in which the patient attends annual eye exams? Dr SPatsy [place]  If pt is not established with a provider, would they like to be referred to a provider to establish care? No .   Dental Screening: Recommended annual dental exams for proper oral hygiene  Community Resource Referral / Chronic Care Management: CRR required this visit?  No   CCM required this visit?  No      Plan:     I have personally reviewed and noted the following in the patient's chart:   Medical and social history Use of alcohol, tobacco or illicit drugs  Current medications and supplements including opioid prescriptions.  Functional ability and status Nutritional status Physical activity Advanced directives List of other physicians Hospitalizations,  surgeries, and ER visits in previous 12 months Vitals Screenings to include cognitive, depression, and falls Referrals and appointments  In addition, I have reviewed and discussed with patient certain preventive protocols, quality metrics, and best practice recommendations. A written personalized care plan for preventive services as well as general preventive health recommendations were provided to patient.     Alexandra Brace, LPN   [id number]   Nurse Notes: None

## 2020-12-25 NOTE — Patient Instructions (Signed)
Alexandra [name] , Thank you for taking time to come for your Medicare Wellness Visit. I appreciate your ongoing commitment to your health goals. Please review the following plan we discussed and let me know if I can assist you in the future.   Screening recommendations/referrals: Colonoscopy: Pt will schedule appt  Mammogram: Done 10/21/20 repeat every year Bone Density: Done 07/20/16 Recommended yearly ophthalmology/optometry visit for glaucoma screening and checkup Recommended yearly dental visit for hygiene and checkup  Vaccinations: Influenza vaccine: Declined and discussed Pneumococcal vaccine: declined and discussed Tdap vaccine: Done 02/27/11 repeat every 10 years  Shingles vaccine: Shingrix discussed. Please contact your pharmacy for coverage information.    Covid-19:Completed 3/25, 4/19, & 10/09/20  Advanced directives: Advance directive discussed with you today. I have provided a copy for you to complete at home and have notarized. Once this is complete please bring a copy in to our office so we can scan it into your chart.  Conditions/risks identified: More walking exercise and maintain Blood pressure   Next appointment: Follow up in one year for your annual wellness visit    Preventive Care 65 Years and Older, Female Preventive care refers to lifestyle choices and visits with your health care provider that can promote health and wellness. What does preventive care include? A yearly physical exam. This is also called an annual well check. Dental exams once or twice a year. Routine eye exams. Ask your health care provider how often you should have your eyes checked. Personal lifestyle choices, including: Daily care of your teeth and gums. Regular physical activity. Eating a healthy diet. Avoiding tobacco and drug use. Limiting alcohol use. Practicing safe sex. Taking low-dose aspirin every day. Taking vitamin and mineral supplements as recommended by your health care  provider. What happens during an annual well check? The services and screenings done by your health care provider during your annual well check will depend on your age, overall health, lifestyle risk factors, and family history of disease. Counseling  Your health care provider may ask you questions about your: Alcohol use. Tobacco use. Drug use. Emotional well-being. Home and relationship well-being. Sexual activity. Eating habits. History of falls. Memory and ability to understand (cognition). Work and work Statistician. Reproductive health. Screening  You may have the following tests or measurements: Height, weight, and BMI. Blood pressure. Lipid and cholesterol levels. These may be checked every 5 years, or more frequently if you are over 8 years old. Skin check. Lung cancer screening. You may have this screening every year starting at age 70 if you have a 30-pack-year history of smoking and currently smoke or have quit within the past 15 years. Fecal occult blood test (FOBT) of the stool. You may have this test every year starting at age 70. Flexible sigmoidoscopy or colonoscopy. You may have a sigmoidoscopy every 5 years or a colonoscopy every 10 years starting at age 94. Hepatitis C blood test. Hepatitis B blood test. Sexually transmitted disease (STD) testing. Diabetes screening. This is done by checking your blood sugar (glucose) after you have not eaten for a while (fasting). You may have this done every 1-3 years. Bone density scan. This is done to screen for osteoporosis. You may have this done starting at age 70. Mammogram. This may be done every 1-2 years. Talk to your health care provider about how often you should have regular mammograms. Talk with your health care provider about your test results, treatment options, and if necessary, the need for more tests. Vaccines  Your health care provider may recommend certain vaccines, such as: Influenza vaccine. This is  recommended every year. Tetanus, diphtheria, and acellular pertussis (Tdap, Td) vaccine. You may need a Td booster every 10 years. Zoster vaccine. You may need this after age 70. Pneumococcal 13-valent conjugate (PCV13) vaccine. One dose is recommended after age 70. Pneumococcal polysaccharide (PPSV23) vaccine. One dose is recommended after age 70. Talk to your health care provider about which screenings and vaccines you need and how often you need them. This information is not intended to replace advice given to you by your health care provider. Make sure you discuss any questions you have with your health care provider. Document Released: 03/15/2015 Document Revised: 11/06/2015 Document Reviewed: 12/18/2014 Elsevier Interactive Patient Education  2017 [place] Prevention in the Home Falls can cause injuries. They can happen to people of all ages. There are many things you can do to make your home safe and to help prevent falls. What can I do on the outside of my home? Regularly fix the edges of walkways and driveways and fix any cracks. Remove anything that might make you trip as you walk through a door, such as a raised step or threshold. Trim any bushes or trees on the path to your home. Use bright outdoor lighting. Clear any walking paths of anything that might make someone trip, such as rocks or tools. Regularly check to see if handrails are loose or broken. Make sure that both sides of any steps have handrails. Any raised decks and porches should have guardrails on the edges. Have any leaves, snow, or ice cleared regularly. Use sand or salt on walking paths during winter. Clean up any spills in your garage right away. This includes oil or grease spills. What can I do in the bathroom? Use night lights. Install grab bars by the toilet and in the tub and shower. Do not use towel bars as grab bars. Use non-skid mats or decals in the tub or shower. If you need to sit down in  the shower, use a plastic, non-slip stool. Keep the floor dry. Clean up any water that spills on the floor as soon as it happens. Remove soap buildup in the tub or shower regularly. Attach bath mats securely with double-sided non-slip rug tape. Do not have throw rugs and other things on the floor that can make you trip. What can I do in the bedroom? Use night lights. Make sure that you have a light by your bed that is easy to reach. Do not use any sheets or blankets that are too big for your bed. They should not hang down onto the floor. Have a firm chair that has side arms. You can use this for support while you get dressed. Do not have throw rugs and other things on the floor that can make you trip. What can I do in the kitchen? Clean up any spills right away. Avoid walking on wet floors. Keep items that you use a lot in easy-to-reach places. If you need to reach something above you, use a strong step stool that has a grab bar. Keep electrical cords out of the way. Do not use floor polish or wax that makes floors slippery. If you must use wax, use non-skid floor wax. Do not have throw rugs and other things on the floor that can make you trip. What can I do with my stairs? Do not leave any items on the stairs. Make sure that there are  handrails on both sides of the stairs and use them. Fix handrails that are broken or loose. Make sure that handrails are as long as the stairways. Check any carpeting to make sure that it is firmly attached to the stairs. Fix any carpet that is loose or worn. Avoid having throw rugs at the top or bottom of the stairs. If you do have throw rugs, attach them to the floor with carpet tape. Make sure that you have a light switch at the top of the stairs and the bottom of the stairs. If you do not have them, ask someone to add them for you. What else can I do to help prevent falls? Wear shoes that: Do not have high heels. Have rubber bottoms. Are comfortable  and fit you well. Are closed at the toe. Do not wear sandals. If you use a stepladder: Make sure that it is fully opened. Do not climb a closed stepladder. Make sure that both sides of the stepladder are locked into place. Ask someone to hold it for you, if possible. Clearly mark and make sure that you can see: Any grab bars or handrails. First and last steps. Where the edge of each step is. Use tools that help you move around (mobility aids) if they are needed. These include: Canes. Walkers. Scooters. Crutches. Turn on the lights when you go into a dark area. Replace any light bulbs as soon as they burn out. Set up your furniture so you have a clear path. Avoid moving your furniture around. If any of your floors are uneven, fix them. If there are any pets around you, be aware of where they are. Review your medicines with your doctor. Some medicines can make you feel dizzy. This can increase your chance of falling. Ask your doctor what other things that you can do to help prevent falls. This information is not intended to replace advice given to you by your health care provider. Make sure you discuss any questions you have with your health care provider. Document Released: 12/13/2008 Document Revised: 07/25/2015 Document Reviewed: 03/23/2014 Elsevier Interactive Patient Education  2017 [employer].

## 2020-12-30 ENCOUNTER — Encounter: Payer: Self-pay | Admitting: Hematology & Oncology

## 2020-12-30 ENCOUNTER — Other Ambulatory Visit: Payer: Self-pay

## 2020-12-30 ENCOUNTER — Ambulatory Visit: Payer: Medicare PPO | Admitting: Hematology & Oncology

## 2020-12-30 ENCOUNTER — Inpatient Hospital Stay (HOSPITAL_BASED_OUTPATIENT_CLINIC_OR_DEPARTMENT_OTHER): Payer: Medicare PPO | Admitting: Hematology & Oncology

## 2020-12-30 ENCOUNTER — Other Ambulatory Visit: Payer: Medicare PPO

## 2020-12-30 ENCOUNTER — Inpatient Hospital Stay: Payer: Medicare PPO | Attending: Hematology & Oncology

## 2020-12-30 ENCOUNTER — Telehealth: Payer: Self-pay | Admitting: *Deleted

## 2020-12-30 VITALS — BP 145/78 | HR 80 | Temp 97.9°F | Resp 17 | Wt 128.0 lb

## 2020-12-30 DIAGNOSIS — N189 Chronic kidney disease, unspecified: Secondary | ICD-10-CM | POA: Diagnosis not present

## 2020-12-30 DIAGNOSIS — D473 Essential (hemorrhagic) thrombocythemia: Secondary | ICD-10-CM | POA: Diagnosis not present

## 2020-12-30 DIAGNOSIS — D471 Chronic myeloproliferative disease: Secondary | ICD-10-CM

## 2020-12-30 DIAGNOSIS — Z7982 Long term (current) use of aspirin: Secondary | ICD-10-CM | POA: Diagnosis not present

## 2020-12-30 DIAGNOSIS — D500 Iron deficiency anemia secondary to blood loss (chronic): Secondary | ICD-10-CM

## 2020-12-30 DIAGNOSIS — D631 Anemia in chronic kidney disease: Secondary | ICD-10-CM | POA: Insufficient documentation

## 2020-12-30 LAB — LACTATE DEHYDROGENASE: LDH: 206 U/L — ABNORMAL HIGH (ref 98–192)

## 2020-12-30 LAB — CBC WITH DIFFERENTIAL (CANCER CENTER ONLY)
Abs Immature Granulocytes: 0.04 10*3/uL (ref 0.00–0.07)
Basophils Absolute: 0.1 10*3/uL (ref 0.0–0.1)
Basophils Relative: 1 %
Eosinophils Absolute: 0.2 10*3/uL (ref 0.0–0.5)
Eosinophils Relative: 3 %
HCT: 24 % — ABNORMAL LOW (ref 36.0–46.0)
Hemoglobin: 7.6 g/dL — ABNORMAL LOW (ref 12.0–15.0)
Immature Granulocytes: 1 %
Lymphocytes Relative: 25 %
Lymphs Abs: 1.7 10*3/uL (ref 0.7–4.0)
MCH: 25.2 pg — ABNORMAL LOW (ref 26.0–34.0)
MCHC: 31.7 g/dL (ref 30.0–36.0)
MCV: 79.5 fL — ABNORMAL LOW (ref 80.0–100.0)
Monocytes Absolute: 0.7 10*3/uL (ref 0.1–1.0)
Monocytes Relative: 11 %
Neutro Abs: 4.1 10*3/uL (ref 1.7–7.7)
Neutrophils Relative %: 59 %
Platelet Count: 401 10*3/uL — ABNORMAL HIGH (ref 150–400)
RBC: 3.02 MIL/uL — ABNORMAL LOW (ref 3.87–5.11)
RDW: 20.2 % — ABNORMAL HIGH (ref 11.5–15.5)
WBC Count: 6.8 10*3/uL (ref 4.0–10.5)
nRBC: 0 % (ref 0.0–0.2)

## 2020-12-30 LAB — CMP (CANCER CENTER ONLY)
ALT: 11 U/L (ref 0–44)
AST: 13 U/L — ABNORMAL LOW (ref 15–41)
Albumin: 4.1 g/dL (ref 3.5–5.0)
Alkaline Phosphatase: 80 U/L (ref 38–126)
Anion gap: 6 (ref 5–15)
BUN: 27 mg/dL — ABNORMAL HIGH (ref 8–23)
CO2: 27 mmol/L (ref 22–32)
Calcium: 9.5 mg/dL (ref 8.9–10.3)
Chloride: 107 mmol/L (ref 98–111)
Creatinine: 1.11 mg/dL — ABNORMAL HIGH (ref 0.44–1.00)
GFR, Estimated: 53 mL/min — ABNORMAL LOW (ref >60)
Glucose, Bld: 102 mg/dL — ABNORMAL HIGH (ref 70–99)
Potassium: 3.8 mmol/L (ref 3.5–5.1)
Sodium: 140 mmol/L (ref 135–145)
Total Bilirubin: 0.6 mg/dL (ref 0.3–1.2)
Total Protein: 7 g/dL (ref 6.5–8.1)

## 2020-12-30 LAB — IRON AND TIBC
Iron: 71 ug/dL (ref 41–142)
Saturation Ratios: 33 % (ref 21–57)
TIBC: 218 ug/dL — ABNORMAL LOW (ref 236–444)
UIBC: 146 ug/dL (ref 120–384)

## 2020-12-30 LAB — RETICULOCYTES
Immature Retic Fract: 17.6 % — ABNORMAL HIGH (ref 2.3–15.9)
RBC.: 3.01 MIL/uL — ABNORMAL LOW (ref 3.87–5.11)
Retic Count, Absolute: 37 10*3/uL (ref 19.0–186.0)
Retic Ct Pct: 1.2 % (ref 0.4–3.1)

## 2020-12-30 LAB — FERRITIN: Ferritin: 284 ng/mL (ref 11–307)

## 2020-12-30 LAB — SAVE SMEAR(SSMR), FOR PROVIDER SLIDE REVIEW

## 2020-12-30 NOTE — Telephone Encounter (Signed)
Per 12/30/20 los - gave patient upcoming appointment - confirmed
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
## 2020-12-31 ENCOUNTER — Ambulatory Visit: Payer: Medicare PPO

## 2021-01-30 ENCOUNTER — Other Ambulatory Visit: Payer: Self-pay | Admitting: Hematology & Oncology

## 2021-01-30 DIAGNOSIS — D473 Essential (hemorrhagic) thrombocythemia: Secondary | ICD-10-CM

## 2021-01-30 DIAGNOSIS — D508 Other iron deficiency anemias: Secondary | ICD-10-CM

## 2021-02-26 ENCOUNTER — Other Ambulatory Visit: Payer: Self-pay | Admitting: Internal Medicine

## 2021-03-01 ENCOUNTER — Other Ambulatory Visit: Payer: Self-pay | Admitting: Internal Medicine

## 2021-03-04 ENCOUNTER — Inpatient Hospital Stay (HOSPITAL_BASED_OUTPATIENT_CLINIC_OR_DEPARTMENT_OTHER): Payer: Medicare PPO | Admitting: Hematology & Oncology

## 2021-03-04 ENCOUNTER — Inpatient Hospital Stay: Payer: Medicare PPO | Attending: Hematology & Oncology

## 2021-03-04 ENCOUNTER — Encounter: Payer: Self-pay | Admitting: Hematology & Oncology

## 2021-03-04 ENCOUNTER — Other Ambulatory Visit: Payer: Self-pay

## 2021-03-04 VITALS — BP 145/82 | HR 89 | Temp 98.2°F | Resp 16 | Wt 125.0 lb

## 2021-03-04 DIAGNOSIS — D631 Anemia in chronic kidney disease: Secondary | ICD-10-CM | POA: Insufficient documentation

## 2021-03-04 DIAGNOSIS — Z7982 Long term (current) use of aspirin: Secondary | ICD-10-CM | POA: Diagnosis not present

## 2021-03-04 DIAGNOSIS — Z79899 Other long term (current) drug therapy: Secondary | ICD-10-CM | POA: Insufficient documentation

## 2021-03-04 DIAGNOSIS — D473 Essential (hemorrhagic) thrombocythemia: Secondary | ICD-10-CM | POA: Diagnosis not present

## 2021-03-04 DIAGNOSIS — N189 Chronic kidney disease, unspecified: Secondary | ICD-10-CM | POA: Diagnosis not present

## 2021-03-04 DIAGNOSIS — D471 Chronic myeloproliferative disease: Secondary | ICD-10-CM

## 2021-03-04 LAB — CBC WITH DIFFERENTIAL (CANCER CENTER ONLY)
Abs Immature Granulocytes: 0.05 10*3/uL (ref 0.00–0.07)
Basophils Absolute: 0 10*3/uL (ref 0.0–0.1)
Basophils Relative: 1 %
Eosinophils Absolute: 0.1 10*3/uL (ref 0.0–0.5)
Eosinophils Relative: 2 %
HCT: 26.5 % — ABNORMAL LOW (ref 36.0–46.0)
Hemoglobin: 8.2 g/dL — ABNORMAL LOW (ref 12.0–15.0)
Immature Granulocytes: 1 %
Lymphocytes Relative: 25 %
Lymphs Abs: 1.5 10*3/uL (ref 0.7–4.0)
MCH: 25.1 pg — ABNORMAL LOW (ref 26.0–34.0)
MCHC: 30.9 g/dL (ref 30.0–36.0)
MCV: 81 fL (ref 80.0–100.0)
Monocytes Absolute: 0.7 10*3/uL (ref 0.1–1.0)
Monocytes Relative: 11 %
Neutro Abs: 3.8 10*3/uL (ref 1.7–7.7)
Neutrophils Relative %: 60 %
Platelet Count: 249 10*3/uL (ref 150–400)
RBC: 3.27 MIL/uL — ABNORMAL LOW (ref 3.87–5.11)
RDW: 19.4 % — ABNORMAL HIGH (ref 11.5–15.5)
WBC Count: 6.1 10*3/uL (ref 4.0–10.5)
nRBC: 0 % (ref 0.0–0.2)

## 2021-03-04 LAB — CMP (CANCER CENTER ONLY)
ALT: 13 U/L (ref 0–44)
AST: 14 U/L — ABNORMAL LOW (ref 15–41)
Albumin: 4.2 g/dL (ref 3.5–5.0)
Alkaline Phosphatase: 72 U/L (ref 38–126)
Anion gap: 7 (ref 5–15)
BUN: 28 mg/dL — ABNORMAL HIGH (ref 8–23)
CO2: 28 mmol/L (ref 22–32)
Calcium: 9.5 mg/dL (ref 8.9–10.3)
Chloride: 105 mmol/L (ref 98–111)
Creatinine: 1.2 mg/dL — ABNORMAL HIGH (ref 0.44–1.00)
GFR, Estimated: 49 mL/min — ABNORMAL LOW (ref >60)
Glucose, Bld: 98 mg/dL (ref 70–99)
Potassium: 3.8 mmol/L (ref 3.5–5.1)
Sodium: 140 mmol/L (ref 135–145)
Total Bilirubin: 0.6 mg/dL (ref 0.3–1.2)
Total Protein: 7.2 g/dL (ref 6.5–8.1)

## 2021-03-04 LAB — SAVE SMEAR(SSMR), FOR PROVIDER SLIDE REVIEW

## 2021-03-04 LAB — RETICULOCYTES
Immature Retic Fract: 15.2 % (ref 2.3–15.9)
RBC.: 3.34 MIL/uL — ABNORMAL LOW (ref 3.87–5.11)
Retic Count, Absolute: 26.7 10*3/uL (ref 19.0–186.0)
Retic Ct Pct: 0.8 % (ref 0.4–3.1)

## 2021-03-04 LAB — LACTATE DEHYDROGENASE: LDH: 227 U/L — ABNORMAL HIGH (ref 98–192)

## 2021-03-04 NOTE — Progress Notes (Signed)
[name]  [employer] is [employer].  Hematology and Oncology Follow Up Visit  Alexandra [name] [id number] 08/17/1950 71 y.o. 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 is back for followup.  So far, she is doing quite nicely.  She did have a wonderful Thanksgiving, Christmas and New Year's.  It was nice and quiet which she enjoyed.  She is doing go down to [place], [place] in February for a brother's 65th birthday.  She has had no problems with respect to the anagrelide.  She has had no problems with cardiac issues.  There is no fever.  She has had no nausea or vomiting.  She has had no change in bowel or bladder habits.  There is been no leg swelling.  Her iron studies back in October showed a ferritin of 284 with an iron saturation of 33%.  Overall, I would say performance status is ECOG 1.    Medications:  Current Outpatient Medications:    amLODipine (NORVASC) 10 MG tablet, TAKE 1 TABLET BY MOUTH EVERY DAY, Disp: 90 tablet, Rfl: 0   anagrelide (AGRYLIN) 1 MG capsule, TAKE 5 CAPSULES BY MOUTH DAILY, Disp: 450 capsule, Rfl: 3   aspirin 81 MG tablet, Take 81 mg by mouth daily., Disp: , Rfl:    Fe Fum-FePoly-Vit C-Vit B3 (INTEGRA) 62.5-62.5-40-3 MG CAPS, TAKE 1 CAPSULE BY MOUTH DAILY, Disp: 30 capsule, Rfl: 8   folic acid (FOLVITE) 1 MG tablet, Take 2 tablets by mouth once daily, Disp: 60 tablet, Rfl: 0   glucose blood (ONE TOUCH ULTRA TEST) test strip, Use to test blood sugar 1 times daily. **PT NEEDS FOLLOW UP APPT FOR FURTHER REFILLS**, Disp: 100 each, Rfl: 0   lisinopril (ZESTRIL) 20 MG tablet, TAKE 2 TABLETS BY MOUTH EVERY DAY, Disp: 180 tablet, Rfl: 3   Nebivolol HCl 20 MG TABS,  TAKE 1 TABLET(20 MG) BY MOUTH DAILY, Disp: 90 tablet, Rfl: 0   ONETOUCH DELICA LANCETS 06C MISC, Use to test blood sugar 1 time daily. **PT NEEDS FOLLOW UP APPT FOR FURTHER REFILLS**, Disp: 100 each, Rfl: 1   Vitamin D, Ergocalciferol, (DRISDOL) 1.25 MG (50000 UNIT) CAPS capsule, TAKE 1 CAPSULE BY MOUTH 1 TIME A WEEK, Disp: 12 capsule, Rfl: 2  Allergies: No Known Allergies  Past Medical History, Surgical history, Social history, and Family History were reviewed and updated.  Review of Systems: Review of Systems  All other systems reviewed and are negative.Marland Kitchen  Physical Exam:  weight is 125 lb (56.7 kg). Her oral temperature is 98.2 F (36.8 C). Her blood pressure is 145/82 (abnormal) and her pulse is 89. Her respiration is 16 and oxygen saturation is 100%. =jh  Physical Exam Vitals reviewed.  HENT:     Head: Normocephalic and atraumatic.  Eyes:     Pupils: Pupils are equal, round, and reactive to light.  Cardiovascular:     Rate and Rhythm: Normal rate and regular rhythm.     Heart sounds: Normal heart sounds.  Pulmonary:     Effort: Pulmonary effort is normal.     Breath sounds: Normal breath sounds.  Abdominal:     General: Bowel sounds are normal.  Palpations: Abdomen is soft.  Musculoskeletal:        General: No tenderness or deformity. Normal range of motion.     Cervical back: Normal range of motion.  Lymphadenopathy:     Cervical: No cervical adenopathy.  Skin:    General: Skin is warm and dry.     Findings: No erythema or rash.  Neurological:     Mental Status: She is alert and oriented to person, place, and time.  Psychiatric:        Behavior: Behavior normal.        Thought Content: Thought content normal.        Judgment: Judgment normal.     Lab Results  Component Value Date   WBC 6.8 12/30/2020   HGB 7.6 (L) 12/30/2020   HCT 24.0 (L) 12/30/2020   MCV 79.5 (L) 12/30/2020   PLT 401 (H) 12/30/2020     Chemistry      Component Value Date/Time   NA  140 03/04/2021 1148   NA 144 02/02/2017 1158   NA 140 07/24/2016 1007   K 3.8 03/04/2021 1148   K 3.7 02/02/2017 1158   K 3.6 07/24/2016 1007   CL 105 03/04/2021 1148   CL 103 02/02/2017 1158   CO2 28 03/04/2021 1148   CO2 29 02/02/2017 1158   CO2 26 07/24/2016 1007   BUN 28 (H) 03/04/2021 1148   BUN 20 02/02/2017 1158   BUN 30.3 (H) 07/24/2016 1007   CREATININE 1.20 (H) 03/04/2021 1148   CREATININE 0.9 02/02/2017 1158   CREATININE 1.1 07/24/2016 1007   GLU 129 02/02/2017 0000      Component Value Date/Time   CALCIUM 9.5 03/04/2021 1148   CALCIUM 9.0 02/02/2017 1158   CALCIUM 9.3 07/24/2016 1007   ALKPHOS 72 03/04/2021 1148   ALKPHOS 110 (H) 02/02/2017 1158   ALKPHOS 104 07/24/2016 1007   AST 14 (L) 03/04/2021 1148   AST 13 07/24/2016 1007   ALT 13 03/04/2021 1148   ALT 26 02/02/2017 1158   ALT 15 07/24/2016 1007   BILITOT 0.6 03/04/2021 1148   BILITOT 0.55 07/24/2016 1007      Impression and Plan: [name] is 71 year old [name]. She has essential thrombocythemia. She actually is Calreticulin positive.  I see that her platelet count is holding steady.  She is still quite anemic but is really not symptomatic with this.  Her iron studies have always looked fine.  I do not see that we have to make any changes with her anagrelide.  We will now try to get her back in March.  I think we can go 2 months given that her blood counts are holding steady.      [name], MD 1/3/202312:33 PM

## 2021-03-05 LAB — IRON AND IRON BINDING CAPACITY (CC-WL,HP ONLY)
Iron: 78 ug/dL (ref 28–170)
Saturation Ratios: 34 % — ABNORMAL HIGH (ref 10.4–31.8)
TIBC: 231 ug/dL — ABNORMAL LOW (ref 250–450)
UIBC: 153 ug/dL (ref 148–442)

## 2021-03-05 LAB — FERRITIN: Ferritin: 327 ng/mL — ABNORMAL HIGH (ref 11–307)

## 2021-03-07 ENCOUNTER — Other Ambulatory Visit: Payer: Self-pay | Admitting: Hematology & Oncology

## 2021-03-07 DIAGNOSIS — D473 Essential (hemorrhagic) thrombocythemia: Secondary | ICD-10-CM

## 2021-03-07 DIAGNOSIS — D508 Other iron deficiency anemias: Secondary | ICD-10-CM

## 2021-03-25 ENCOUNTER — Other Ambulatory Visit: Payer: Self-pay | Admitting: Internal Medicine

## 2021-04-04 ENCOUNTER — Other Ambulatory Visit: Payer: Self-pay | Admitting: Hematology & Oncology

## 2021-04-04 DIAGNOSIS — D508 Other iron deficiency anemias: Secondary | ICD-10-CM

## 2021-04-04 DIAGNOSIS — D473 Essential (hemorrhagic) thrombocythemia: Secondary | ICD-10-CM

## 2021-05-09 ENCOUNTER — Other Ambulatory Visit: Payer: Self-pay

## 2021-05-09 ENCOUNTER — Inpatient Hospital Stay: Payer: Medicare PPO

## 2021-05-09 ENCOUNTER — Encounter: Payer: Self-pay | Admitting: Hematology & Oncology

## 2021-05-09 ENCOUNTER — Inpatient Hospital Stay: Payer: Medicare PPO | Attending: Hematology & Oncology | Admitting: Hematology & Oncology

## 2021-05-09 VITALS — BP 144/75 | HR 84 | Temp 98.1°F | Resp 16 | Wt 129.0 lb

## 2021-05-09 DIAGNOSIS — Z79899 Other long term (current) drug therapy: Secondary | ICD-10-CM | POA: Diagnosis not present

## 2021-05-09 DIAGNOSIS — D473 Essential (hemorrhagic) thrombocythemia: Secondary | ICD-10-CM | POA: Insufficient documentation

## 2021-05-09 DIAGNOSIS — D631 Anemia in chronic kidney disease: Secondary | ICD-10-CM | POA: Diagnosis not present

## 2021-05-09 DIAGNOSIS — Z7982 Long term (current) use of aspirin: Secondary | ICD-10-CM | POA: Diagnosis not present

## 2021-05-09 DIAGNOSIS — N189 Chronic kidney disease, unspecified: Secondary | ICD-10-CM | POA: Insufficient documentation

## 2021-05-09 LAB — CBC WITH DIFFERENTIAL (CANCER CENTER ONLY)
Abs Immature Granulocytes: 0.02 10*3/uL (ref 0.00–0.07)
Basophils Absolute: 0.1 10*3/uL (ref 0.0–0.1)
Basophils Relative: 1 %
Eosinophils Absolute: 0.2 10*3/uL (ref 0.0–0.5)
Eosinophils Relative: 3 %
HCT: 24.8 % — ABNORMAL LOW (ref 36.0–46.0)
Hemoglobin: 7.7 g/dL — ABNORMAL LOW (ref 12.0–15.0)
Immature Granulocytes: 0 %
Lymphocytes Relative: 19 %
Lymphs Abs: 1.3 10*3/uL (ref 0.7–4.0)
MCH: 25.4 pg — ABNORMAL LOW (ref 26.0–34.0)
MCHC: 31 g/dL (ref 30.0–36.0)
MCV: 81.8 fL (ref 80.0–100.0)
Monocytes Absolute: 0.8 10*3/uL (ref 0.1–1.0)
Monocytes Relative: 12 %
Neutro Abs: 4.3 10*3/uL (ref 1.7–7.7)
Neutrophils Relative %: 65 %
Platelet Count: 392 10*3/uL (ref 150–400)
RBC: 3.03 MIL/uL — ABNORMAL LOW (ref 3.87–5.11)
RDW: 19.5 % — ABNORMAL HIGH (ref 11.5–15.5)
WBC Count: 6.6 10*3/uL (ref 4.0–10.5)
nRBC: 0 % (ref 0.0–0.2)

## 2021-05-09 LAB — CMP (CANCER CENTER ONLY)
ALT: 19 U/L (ref 0–44)
AST: 20 U/L (ref 15–41)
Albumin: 4 g/dL (ref 3.5–5.0)
Alkaline Phosphatase: 74 U/L (ref 38–126)
Anion gap: 8 (ref 5–15)
BUN: 28 mg/dL — ABNORMAL HIGH (ref 8–23)
CO2: 28 mmol/L (ref 22–32)
Calcium: 9 mg/dL (ref 8.9–10.3)
Chloride: 106 mmol/L (ref 98–111)
Creatinine: 1.07 mg/dL — ABNORMAL HIGH (ref 0.44–1.00)
GFR, Estimated: 56 mL/min — ABNORMAL LOW (ref >60)
Glucose, Bld: 101 mg/dL — ABNORMAL HIGH (ref 70–99)
Potassium: 3.8 mmol/L (ref 3.5–5.1)
Sodium: 142 mmol/L (ref 135–145)
Total Bilirubin: 0.7 mg/dL (ref 0.3–1.2)
Total Protein: 7 g/dL (ref 6.5–8.1)

## 2021-05-09 LAB — IRON AND IRON BINDING CAPACITY (CC-WL,HP ONLY)
Iron: 79 ug/dL (ref 28–170)
Saturation Ratios: 33 % — ABNORMAL HIGH (ref 10.4–31.8)
TIBC: 242 ug/dL — ABNORMAL LOW (ref 250–450)
UIBC: 163 ug/dL (ref 148–442)

## 2021-05-09 LAB — LACTATE DEHYDROGENASE: LDH: 228 U/L — ABNORMAL HIGH (ref 98–192)

## 2021-05-09 LAB — SAVE SMEAR(SSMR), FOR PROVIDER SLIDE REVIEW

## 2021-05-09 NOTE — Progress Notes (Signed)
Plan Alexandra LaraIt is a [employer].  Hematology and Oncology Follow Up Visit ? ?Alexandra [name] ?[id number] ?1950/09/21 71 y.o. ?05/09/2021 ? ? ?Principle Diagnosis:  ?Anagrelide 5 mg po q day - started on 17/20/2020  ?Aspirin 81 mg by mouth daily ?Folic acid 2 mg by mouth daily ?Aranesp 300 mcg sq for Hgb < 10 -- on hold for now ? 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 ?Interim History:  Alexandra [name] is back for followup.  As always, she been going down to [place], [place].  She is going back down there again this weekend for another birthday.   ? ?She has had no problems since we last saw her back in early January.  She has had no fatigue or weakness.  She is quite anemic but she has certainly lived with this.  She has accommodated the anemia nicely. ? ?She has had no problems with iron deficiency.  Her last iron saturation was 34%. ? ?She is on the anagrelide.  She is doing well on the anagrelide.  There is no leg swelling.  She has had no palpitations. ? ?Her appetite has been quite good.  She is trying to watch her weight.  She is trying to exercise. ? ?There is been no problems with bowels or bladder.  She has had no diarrhea or constipation. ? ?Overall, I would say performance status is probably ECOG 1.   ? ?Medications:  ?Current Outpatient Medications:  ?  amLODipine (NORVASC) 10 MG tablet, TAKE 1 TABLET BY MOUTH EVERY DAY, Disp: 90 tablet, Rfl: 0 ?  anagrelide (AGRYLIN) 1 MG capsule, TAKE 5 CAPSULES BY MOUTH DAILY, Disp: 450 capsule, Rfl: 3 ?  aspirin 81 MG tablet, Take 81 mg by mouth daily., Disp: , Rfl:  ?  Fe Fum-FePoly-Vit C-Vit B3 (INTEGRA) 62.5-62.5-40-3 MG CAPS, TAKE 1 CAPSULE BY MOUTH DAILY, Disp: 30 capsule, Rfl: 8 ?  folic acid (FOLVITE) 1 MG tablet, Take 2 tablets by mouth once daily, Disp: 60 tablet, Rfl: 6 ?  glucose blood (ONE TOUCH ULTRA TEST) test strip, Use to test blood sugar 1 times daily. **PT NEEDS FOLLOW  UP APPT FOR FURTHER REFILLS**, Disp: 100 each, Rfl: 0 ?  lisinopril (ZESTRIL) 20 MG tablet, TAKE 2 TABLETS BY MOUTH EVERY DAY, Disp: 180 tablet, Rfl: 3 ?  Nebivolol HCl 20 MG TABS, TAKE 1 TABLET(20 MG) BY MOUTH DAILY, Disp: 90 tablet, Rfl: 0 ?  ONETOUCH DELICA LANCETS 99M MISC, Use to test blood sugar 1 time daily. **PT NEEDS FOLLOW UP APPT FOR FURTHER REFILLS**, Disp: 100 each, Rfl: 1 ?  Vitamin D, Ergocalciferol, (DRISDOL) 1.25 MG (50000 UNIT) CAPS capsule, TAKE 1 CAPSULE BY MOUTH 1 TIME A WEEK, Disp: 12 capsule, Rfl: 2 ? ?Allergies: No Known Allergies ? ?Past Medical History, Surgical history, Social history, and Family History were reviewed and updated. ? ?Review of Systems: ?Review of Systems  ?All other systems reviewed and are negative.. ? ?Physical Exam: ? weight is 129 lb (58.5 kg). Her oral temperature is 98.1 ?F (36.7 ?C). Her blood pressure is 144/75 (abnormal) and her pulse is 84. Her respiration is 16 and oxygen saturation is 100%. =jh ? ?Physical Exam ?Vitals reviewed.  ?HENT:  ?   Head: Normocephalic and atraumatic.  ?Eyes:  ?   Pupils: Pupils are equal, round, and reactive to light.  ?Cardiovascular:  ?   Rate and Rhythm: Normal rate and regular rhythm.  ?   Heart  sounds: Normal heart sounds.  ?Pulmonary:  ?   Effort: Pulmonary effort is normal.  ?   Breath sounds: Normal breath sounds.  ?Abdominal:  ?   General: Bowel sounds are normal.  ?   Palpations: Abdomen is soft.  ?Musculoskeletal:     ?   General: No tenderness or deformity. Normal range of motion.  ?   Cervical back: Normal range of motion.  ?Lymphadenopathy:  ?   Cervical: No cervical adenopathy.  ?Skin: ?   General: Skin is warm and dry.  ?   Findings: No erythema or rash.  ?Neurological:  ?   Mental Status: She is alert and oriented to person, place, and time.  ?Psychiatric:     ?   Behavior: Behavior normal.     ?   Thought Content: Thought content normal.     ?   Judgment: Judgment normal.  ? ? ? ?Lab Results  ?Component Value Date  ?  WBC 6.6 05/09/2021  ? HGB 7.7 (L) 05/09/2021  ? HCT 24.8 (L) 05/09/2021  ? MCV 81.8 05/09/2021  ? PLT 392 05/09/2021  ? ?  Chemistry   ?   ?Component Value Date/Time  ? NA 142 05/09/2021 1035  ? NA 144 02/02/2017 1158  ? NA 140 07/24/2016 1007  ? K 3.8 05/09/2021 1035  ? K 3.7 02/02/2017 1158  ? K 3.6 07/24/2016 1007  ? CL 106 05/09/2021 1035  ? CL 103 02/02/2017 1158  ? CO2 28 05/09/2021 1035  ? CO2 29 02/02/2017 1158  ? CO2 26 07/24/2016 1007  ? BUN 28 (H) 05/09/2021 1035  ? BUN 20 02/02/2017 1158  ? BUN 30.3 (H) 07/24/2016 1007  ? CREATININE 1.07 (H) 05/09/2021 1035  ? CREATININE 0.9 02/02/2017 1158  ? CREATININE 1.1 07/24/2016 1007  ? GLU 129 02/02/2017 0000  ?    ?Component Value Date/Time  ? CALCIUM 9.0 05/09/2021 1035  ? CALCIUM 9.0 02/02/2017 1158  ? CALCIUM 9.3 07/24/2016 1007  ? ALKPHOS 74 05/09/2021 1035  ? ALKPHOS 110 (H) 02/02/2017 1158  ? ALKPHOS 104 07/24/2016 1007  ? AST 20 05/09/2021 1035  ? AST 13 07/24/2016 1007  ? ALT 19 05/09/2021 1035  ? ALT 26 02/02/2017 1158  ? ALT 15 07/24/2016 1007  ? BILITOT 0.7 05/09/2021 1035  ? BILITOT 0.55 07/24/2016 1007  ?  ? ? ?Impression and Plan: ?Alexandra [name] is 71 year old [name]. She has essential thrombocythemia. She actually is Calreticulin positive. ? ?I am happy to see that her platelet count is holding steady.  She certainly is anemic but yet she manages this quite well.  She does not want to have ESA to be given. ? ?I will plan to get her back in a couple months now.  I think this would be very reasonable. ? ?I do not see need for any kind of invasive studies that we have to do.   ? ? [name], MD ?3/10/202311:18 AM ? ?

## 2021-05-12 LAB — FERRITIN: Ferritin: 274 ng/mL (ref 11–307)

## 2021-06-02 ENCOUNTER — Other Ambulatory Visit: Payer: Self-pay | Admitting: Internal Medicine

## 2021-06-10 DIAGNOSIS — H25813 Combined forms of age-related cataract, bilateral: Secondary | ICD-10-CM | POA: Diagnosis not present

## 2021-06-10 DIAGNOSIS — H40013 Open angle with borderline findings, low risk, bilateral: Secondary | ICD-10-CM | POA: Diagnosis not present

## 2021-06-10 DIAGNOSIS — H33322 Round hole, left eye: Secondary | ICD-10-CM | POA: Diagnosis not present

## 2021-06-10 DIAGNOSIS — E113293 Type 2 diabetes mellitus with mild nonproliferative diabetic retinopathy without macular edema, bilateral: Secondary | ICD-10-CM | POA: Diagnosis not present

## 2021-06-10 DIAGNOSIS — H00021 Hordeolum internum right upper eyelid: Secondary | ICD-10-CM | POA: Diagnosis not present

## 2021-06-27 ENCOUNTER — Inpatient Hospital Stay (HOSPITAL_BASED_OUTPATIENT_CLINIC_OR_DEPARTMENT_OTHER): Payer: Medicare PPO | Admitting: Hematology & Oncology

## 2021-06-27 ENCOUNTER — Encounter: Payer: Self-pay | Admitting: Hematology & Oncology

## 2021-06-27 ENCOUNTER — Inpatient Hospital Stay: Payer: Medicare PPO | Attending: Hematology & Oncology

## 2021-06-27 ENCOUNTER — Telehealth: Payer: Self-pay | Admitting: *Deleted

## 2021-06-27 VITALS — BP 125/58 | HR 81 | Temp 97.7°F | Resp 18 | Ht 71.0 in | Wt 124.8 lb

## 2021-06-27 DIAGNOSIS — D75839 Thrombocytosis, unspecified: Secondary | ICD-10-CM | POA: Insufficient documentation

## 2021-06-27 DIAGNOSIS — D473 Essential (hemorrhagic) thrombocythemia: Secondary | ICD-10-CM | POA: Diagnosis not present

## 2021-06-27 DIAGNOSIS — D631 Anemia in chronic kidney disease: Secondary | ICD-10-CM | POA: Insufficient documentation

## 2021-06-27 DIAGNOSIS — N189 Chronic kidney disease, unspecified: Secondary | ICD-10-CM | POA: Diagnosis not present

## 2021-06-27 LAB — CMP (CANCER CENTER ONLY)
ALT: 10 U/L (ref 0–44)
AST: 12 U/L — ABNORMAL LOW (ref 15–41)
Albumin: 4.1 g/dL (ref 3.5–5.0)
Alkaline Phosphatase: 76 U/L (ref 38–126)
Anion gap: 4 — ABNORMAL LOW (ref 5–15)
BUN: 32 mg/dL — ABNORMAL HIGH (ref 8–23)
CO2: 30 mmol/L (ref 22–32)
Calcium: 9.3 mg/dL (ref 8.9–10.3)
Chloride: 107 mmol/L (ref 98–111)
Creatinine: 1.27 mg/dL — ABNORMAL HIGH (ref 0.44–1.00)
GFR, Estimated: 45 mL/min — ABNORMAL LOW (ref >60)
Glucose, Bld: 111 mg/dL — ABNORMAL HIGH (ref 70–99)
Potassium: 4.3 mmol/L (ref 3.5–5.1)
Sodium: 141 mmol/L (ref 135–145)
Total Bilirubin: 0.7 mg/dL (ref 0.3–1.2)
Total Protein: 7.1 g/dL (ref 6.5–8.1)

## 2021-06-27 LAB — CBC WITH DIFFERENTIAL (CANCER CENTER ONLY)
Abs Immature Granulocytes: 0.04 10*3/uL (ref 0.00–0.07)
Basophils Absolute: 0.1 10*3/uL (ref 0.0–0.1)
Basophils Relative: 1 %
Eosinophils Absolute: 0.2 10*3/uL (ref 0.0–0.5)
Eosinophils Relative: 3 %
HCT: 26.6 % — ABNORMAL LOW (ref 36.0–46.0)
Hemoglobin: 8.3 g/dL — ABNORMAL LOW (ref 12.0–15.0)
Immature Granulocytes: 1 %
Lymphocytes Relative: 24 %
Lymphs Abs: 1.6 10*3/uL (ref 0.7–4.0)
MCH: 25.5 pg — ABNORMAL LOW (ref 26.0–34.0)
MCHC: 31.2 g/dL (ref 30.0–36.0)
MCV: 81.6 fL (ref 80.0–100.0)
Monocytes Absolute: 0.6 10*3/uL (ref 0.1–1.0)
Monocytes Relative: 9 %
Neutro Abs: 4.1 10*3/uL (ref 1.7–7.7)
Neutrophils Relative %: 62 %
Platelet Count: 389 10*3/uL (ref 150–400)
RBC: 3.26 MIL/uL — ABNORMAL LOW (ref 3.87–5.11)
RDW: 19.5 % — ABNORMAL HIGH (ref 11.5–15.5)
WBC Count: 6.6 10*3/uL (ref 4.0–10.5)
nRBC: 0 % (ref 0.0–0.2)

## 2021-06-27 LAB — IRON AND IRON BINDING CAPACITY (CC-WL,HP ONLY)
Iron: 87 ug/dL (ref 28–170)
Saturation Ratios: 36 % — ABNORMAL HIGH (ref 10.4–31.8)
TIBC: 245 ug/dL — ABNORMAL LOW (ref 250–450)
UIBC: 158 ug/dL (ref 148–442)

## 2021-06-27 LAB — LACTATE DEHYDROGENASE: LDH: 198 U/L — ABNORMAL HIGH (ref 98–192)

## 2021-06-27 LAB — FERRITIN: Ferritin: 246 ng/mL (ref 11–307)

## 2021-06-27 NOTE — Telephone Encounter (Signed)
Per 06/27/21 los - gave upcoming appointments - confirmed ?

## 2021-06-27 NOTE — Progress Notes (Signed)
Plan Alexandra [name] ? ?[employer] is [employer].  Hematology and Oncology Follow Up Visit ? ?Alexandra [name] ?[id number] ?October 07, 1950 71 y.o. ?06/27/2021 ? ? ?Principle Diagnosis:  ?Anagrelide 5 mg po q day - started on 17/20/2020  ?Aspirin 81 mg by mouth daily ?Folic acid 2 mg by mouth daily ?Aranesp 300 mcg sq for Hgb < 10 -- on hold for now ? 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 ?Interim History:  Alexandra [name] is back for followup.  She looks fantastic.  She has been doing quite well.  The pollen does seem to bother her but she still is walking several times a week. ? ?She has had no problems with the anagrelide.  She is on quite a high dose but yet this works to help keep her platelet count stable. ? ?She has had no problems with nausea or vomiting.  She has had no cough or shortness of breath.  She has had no change in bowel or bladder habits.  There has been no bleeding.  She has had no fever.  There is been no rashes.  She has had no leg swelling. ? ?Last time that we saw her, her ferritin was 274 with an iron saturation of 33%. ? ?Overall, I would say her performance status is probably ECOG 0. ? ?Medications:  ?Current Outpatient Medications:  ?  amLODipine (NORVASC) 10 MG tablet, TAKE 1 TABLET BY MOUTH EVERY DAY, Disp: 30 tablet, Rfl: 0 ?  anagrelide (AGRYLIN) 1 MG capsule, TAKE 5 CAPSULES BY MOUTH DAILY, Disp: 450 capsule, Rfl: 3 ?  aspirin 81 MG tablet, Take 81 mg by mouth daily., Disp: , Rfl:  ?  Fe Fum-FePoly-Vit C-Vit B3 (INTEGRA) 62.5-62.5-40-3 MG CAPS, TAKE 1 CAPSULE BY MOUTH DAILY, Disp: 30 capsule, Rfl: 8 ?  folic acid (FOLVITE) 1 MG tablet, Take 2 tablets by mouth once daily, Disp: 60 tablet, Rfl: 6 ?  glucose blood (ONE TOUCH ULTRA TEST) test strip, Use to test blood sugar 1 times daily. **PT NEEDS FOLLOW UP APPT FOR FURTHER REFILLS**, Disp: 100 each, Rfl: 0 ?  lisinopril (ZESTRIL) 20 MG tablet, TAKE 2 TABLETS BY MOUTH EVERY DAY, Disp:  180 tablet, Rfl: 3 ?  Nebivolol HCl 20 MG TABS, TAKE 1 TABLET(20 MG) BY MOUTH DAILY, Disp: 90 tablet, Rfl: 0 ?  ONETOUCH DELICA LANCETS 93G MISC, Use to test blood sugar 1 time daily. **PT NEEDS FOLLOW UP APPT FOR FURTHER REFILLS**, Disp: 100 each, Rfl: 1 ?  Vitamin D, Ergocalciferol, (DRISDOL) 1.25 MG (50000 UNIT) CAPS capsule, TAKE 1 CAPSULE BY MOUTH 1 TIME A WEEK, Disp: 12 capsule, Rfl: 2 ? ?Allergies: No Known Allergies ? ?Past Medical History, Surgical history, Social history, and Family History were reviewed and updated. ? ?Review of Systems: ?Review of Systems  ?All other systems reviewed and are negative.. ? ?Physical Exam: ? height is '5\' 11"'$  (1.803 m) and weight is 124 lb 12 oz (56.6 kg). Her oral temperature is 97.7 ?F (36.5 ?C). Her blood pressure is 125/58 (abnormal) and her pulse is 81. Her respiration is 18 and oxygen saturation is 100%. =jh ? ?Physical Exam ?Vitals reviewed.  ?HENT:  ?   Head: Normocephalic and atraumatic.  ?Eyes:  ?   Pupils: Pupils are equal, round, and reactive to light.  ?Cardiovascular:  ?   Rate and Rhythm: Normal rate and regular rhythm.  ?   Heart sounds: Normal heart sounds.  ?Pulmonary:  ?   Effort: Pulmonary  effort is normal.  ?   Breath sounds: Normal breath sounds.  ?Abdominal:  ?   General: Bowel sounds are normal.  ?   Palpations: Abdomen is soft.  ?Musculoskeletal:     ?   General: No tenderness or deformity. Normal range of motion.  ?   Cervical back: Normal range of motion.  ?Lymphadenopathy:  ?   Cervical: No cervical adenopathy.  ?Skin: ?   General: Skin is warm and dry.  ?   Findings: No erythema or rash.  ?Neurological:  ?   Mental Status: She is alert and oriented to person, place, and time.  ?Psychiatric:     ?   Behavior: Behavior normal.     ?   Thought Content: Thought content normal.     ?   Judgment: Judgment normal.  ? ? ? ?Lab Results  ?Component Value Date  ? WBC 6.6 06/27/2021  ? HGB 8.3 (L) 06/27/2021  ? HCT 26.6 (L) 06/27/2021  ? MCV 81.6 06/27/2021   ? PLT 389 06/27/2021  ? ?  Chemistry   ?   ?Component Value Date/Time  ? NA 141 06/27/2021 1010  ? NA 144 02/02/2017 1158  ? NA 140 07/24/2016 1007  ? K 4.3 06/27/2021 1010  ? K 3.7 02/02/2017 1158  ? K 3.6 07/24/2016 1007  ? CL 107 06/27/2021 1010  ? CL 103 02/02/2017 1158  ? CO2 30 06/27/2021 1010  ? CO2 29 02/02/2017 1158  ? CO2 26 07/24/2016 1007  ? BUN 32 (H) 06/27/2021 1010  ? BUN 20 02/02/2017 1158  ? BUN 30.3 (H) 07/24/2016 1007  ? CREATININE 1.27 (H) 06/27/2021 1010  ? CREATININE 0.9 02/02/2017 1158  ? CREATININE 1.1 07/24/2016 1007  ? GLU 129 02/02/2017 0000  ?    ?Component Value Date/Time  ? CALCIUM 9.3 06/27/2021 1010  ? CALCIUM 9.0 02/02/2017 1158  ? CALCIUM 9.3 07/24/2016 1007  ? ALKPHOS 76 06/27/2021 1010  ? ALKPHOS 110 (H) 02/02/2017 1158  ? ALKPHOS 104 07/24/2016 1007  ? AST 12 (L) 06/27/2021 1010  ? AST 13 07/24/2016 1007  ? ALT 10 06/27/2021 1010  ? ALT 26 02/02/2017 1158  ? ALT 15 07/24/2016 1007  ? BILITOT 0.7 06/27/2021 1010  ? BILITOT 0.55 07/24/2016 1007  ?  ? ? ?Impression and Plan: ?Alexandra [name] is 71 year old [name]. She has essential thrombocythemia. She actually is Calreticulin positive. ? ?Everything is doing quite well.  I looked at her blood under the microscope.  Everything looked pretty stable.  I do not see any thing that look like any progression of her thrombocythemia. ? ?I really do not think that we had to make any changes with the anagrelide. ? ?We will now plan to get her back in a couple more months.  She enjoys coming to see [place] but she would much rather be away from the office, likely down in [place], [place] ? ? ? [name], MD ?4/28/202310:42 AM ? ?

## 2021-08-13 ENCOUNTER — Ambulatory Visit (INDEPENDENT_AMBULATORY_CARE_PROVIDER_SITE_OTHER): Payer: Medicare PPO | Admitting: Internal Medicine

## 2021-08-13 ENCOUNTER — Telehealth: Payer: Self-pay

## 2021-08-13 VITALS — BP 128/70 | HR 86 | Temp 97.3°F | Wt 125.8 lb

## 2021-08-13 DIAGNOSIS — N950 Postmenopausal bleeding: Secondary | ICD-10-CM | POA: Diagnosis not present

## 2021-08-13 NOTE — Progress Notes (Signed)
Acute office Visit     CC/Reason for Visit: Vaginal bleeding  HPI: Alexandra [name] is a 71 y.o. female who is coming in today for the above mentioned reasons.  For the past 10 days she has been having vaginal bleeding.  She has been postmenopausal for around 20 years.  She describes vaginal bleeding as light pink, no pain, no recent trauma.  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 She has never used smokeless tobacco. She reports that she does not drink alcohol and does not use drugs.  Allergies: No Known Allergies  Family History:  Family History  Problem Relation Age of Onset   Heart disease Mother        died age 28   Kidney disease Mother        family hx   Heart attack Father 72       massive   Hypertension Sister    Hypertension Sister    Hypertension Sister    Hypertension Sister    Arthritis Other        family hx   Diabetes Other        family hx   Stroke Other         dialysis     Current Outpatient Medications:    amLODipine (NORVASC) 10 MG tablet, TAKE 1 TABLET BY MOUTH EVERY DAY, Disp: 30 tablet, Rfl: 0   anagrelide (AGRYLIN) 1 MG capsule, TAKE 5 CAPSULES BY MOUTH DAILY, Disp: 450 capsule, Rfl: 3   aspirin 81 MG tablet, Take 81 mg by mouth daily., Disp: , Rfl:    Fe Fum-FePoly-Vit C-Vit B3 (INTEGRA) 62.5-62.5-40-3 MG CAPS, TAKE 1 CAPSULE BY MOUTH DAILY, Disp: 30 capsule, Rfl: 8   folic acid (FOLVITE) 1 MG tablet, Take 2 tablets by mouth once daily,  Disp: 60 tablet, Rfl: 6   glucose blood (ONE TOUCH ULTRA TEST) test strip, Use to test blood sugar 1 times daily. **PT NEEDS FOLLOW UP APPT FOR FURTHER REFILLS**, Disp: 100 each, Rfl: 0   lisinopril (ZESTRIL) 20 MG tablet, TAKE 2 TABLETS BY MOUTH EVERY DAY, Disp: 180 tablet, Rfl: 3   Nebivolol HCl 20 MG TABS, TAKE 1 TABLET(20 MG) BY MOUTH DAILY, Disp: 90 tablet, Rfl: 0   ONETOUCH DELICA LANCETS 66Y MISC, Use to test blood sugar 1 time daily. **PT NEEDS FOLLOW UP APPT FOR FURTHER REFILLS**, Disp: 100 each, Rfl: 1   Vitamin D, Ergocalciferol, (DRISDOL) 1.25 MG (50000 UNIT) CAPS capsule, TAKE 1 CAPSULE BY MOUTH 1 TIME A WEEK, Disp: 12 capsule, Rfl: 2  Review of Systems:  Constitutional: Denies fever, chills, diaphoresis, appetite change and fatigue.  HEENT: Denies photophobia, eye pain, redness, hearing loss, ear pain, congestion, sore throat, rhinorrhea, sneezing, mouth sores, trouble swallowing, neck pain, neck stiffness and tinnitus.   Respiratory: Denies SOB, DOE, cough, chest tightness,  and wheezing.   Cardiovascular: Denies chest pain, palpitations and leg swelling.  Gastrointestinal: Denies nausea, vomiting, abdominal pain, diarrhea, constipation, blood  in stool and abdominal distention.  Genitourinary: Denies dysuria, urgency, frequency, hematuria, flank pain and difficulty urinating.  Endocrine: Denies: hot or cold intolerance, sweats, changes in hair or nails, polyuria, polydipsia. Musculoskeletal: Denies myalgias, back pain, joint swelling, arthralgias and gait problem.  Skin: Denies pallor, rash and wound.  Neurological: Denies dizziness, seizures, syncope, weakness, light-headedness, numbness and headaches.  Hematological: Denies adenopathy. Easy bruising, personal or family bleeding history  Psychiatric/Behavioral: Denies suicidal ideation, mood changes, confusion, nervousness, sleep disturbance and agitation    Physical Exam: Vitals:   08/13/21 1509  BP: 128/70  Pulse: 86   Temp: (!) 97.3 F (36.3 C)  TempSrc: Oral  SpO2: 99%  Weight: 125 lb 12.8 oz (57.1 kg)    Body mass index is 17.55 kg/m.   Constitutional: NAD, calm, comfortable Eyes: PERRL, lids and conjunctivae normal ENMT: Mucous membranes are moist.   Psychiatric: Normal judgment and insight. Alert and oriented x 3. Normal mood.    Impression and Plan:  Postmenopausal vaginal bleeding  - Plan: Ambulatory referral to Gynecology for consideration of endometrial biopsy.    Time spent:21 minutes reviewing chart, interviewing and examining patient and formulating plan of care.     Lelon Frohlich, MD [facility] at [facility]

## 2021-08-13 NOTE — Telephone Encounter (Signed)
--  Caller states she has been having vaginal bleeding off and on. Caller states she has an appointment on June 26 for a physical. Started 6 days ago. Denies fever. Denies pain. Denies any other symptoms  08/12/2021 3:36:43 PM See PCP within 24 Hours Cervantes, RN, Dagoberto  Comments User: Zenaida Deed, RN Date/Time Eilene Ghazi Time): 08/12/2021 3:33:05 PM Medical History: Thrombocytopenia.  User: Zenaida Deed, RN Date/Time Eilene Ghazi Time): 08/12/2021 3:33:41 PM States bleeding is light. She is not soaking anything.  Referrals REFERRED TO PCP OFFICE  Pt has appt with Dr Jerilee Hoh on 08/13/21 at [facility]

## 2021-08-21 NOTE — Progress Notes (Signed)
GYNECOLOGY  VISIT   HPI: 71 y.o.   Married Black or [place] American Not Hispanic or Latino  female   No obstetric history on file. with No LMP recorded. Patient is postmenopausal.   here for PMB from 08/05/21 to 08/15/21. Light bleeding, mostly pink. Denies pelvic pain.  Never on HRT.  Last pap was in 6/14, negative.   Not recently sexually active.   GYNECOLOGIC HISTORY: No LMP recorded. Patient is postmenopausal. 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   Goals of care, counseling/discussion 12/15/2019   Iron deficiency anemia 03/15/2013   Diabetes mellitus type 2, controlled, without complications ([place]) 75/91/6384   Routine gynecological examination 08/24/2012   ABNORMAL ELECTROCARDIOGRAM 08/30/2008   THROMBOCYTHEMIA 07/31/2008   Vitamin D deficiency 07/31/2008   HYPOKALEMIA, MILD 07/31/2008   HYPERGLYCEMIA 07/31/2008   Essential hypertension 10/18/2006    Past Medical History:  Diagnosis Date   Diabetes mellitus without complication ([place])    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     Fe Fum-FePoly-Vit C-Vit B3 (INTEGRA) 62.5-62.5-40-3 MG CAPS TAKE 1 CAPSULE BY MOUTH DAILY 30 capsule 8   folic acid (FOLVITE) 1 MG tablet Take 2 tablets by mouth once daily 60 tablet 6   glucose blood (ONE TOUCH ULTRA TEST) test strip Use to test blood sugar 1 times daily. **PT NEEDS FOLLOW UP APPT FOR FURTHER REFILLS** 100 each 0   lisinopril (ZESTRIL) 20 MG tablet TAKE 2 TABLETS BY MOUTH EVERY DAY 180 tablet 3   Nebivolol HCl 20 MG TABS TAKE 1 TABLET(20 MG) BY MOUTH DAILY 90 tablet 0   ONETOUCH DELICA LANCETS 66Z MISC Use to test blood sugar 1 time daily. **PT NEEDS FOLLOW UP APPT FOR FURTHER REFILLS** 100 each 1   Vitamin D, Ergocalciferol, (DRISDOL) 1.25 MG (50000 UNIT) CAPS capsule TAKE 1 CAPSULE BY MOUTH 1 TIME A WEEK 12 capsule 2   No current facility-administered medications for this visit.     ALLERGIES: Patient has no known allergies.  Family History  Problem Relation Age of Onset   Heart disease Mother        died age 63   Kidney disease Mother        family hx   Heart attack  Father 22       massive   Hypertension Sister    Hypertension Sister    Hypertension Sister    Hypertension Sister    Arthritis Other        family hx   Diabetes Other        family hx   Stroke Other         dialysis    Social History   Socioeconomic History   Marital status: Married    Spouse name: Not on file   Number of children: Not on file   Years of education: Not on file   Highest education level: Not on file  Occupational History   Occupation: retired  Tobacco Use   Smoking status: Never   Smokeless tobacco: Never   Tobacco comments:    never used tobacco  Vaping Use   Vaping Use: Never used  Substance and Sexual Activity   Alcohol use: No    Alcohol/week: 0.0 standard drinks of alcohol   Drug use: No   Sexual activity: Yes    Partners: Male    Birth control/protection: Post-menopausal  Other Topics Concern   Not on file  Social  History Narrative   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   No tobacco no ethoh little caffiene .    Social Determinants of Health   Financial Resource Strain: Low Risk  (12/25/2020)   Overall Financial Resource Strain (CARDIA)    Difficulty of Paying Living Expenses: Not hard at all  Food Insecurity: No Food Insecurity (12/25/2020)   Hunger Vital Sign    Worried About Running Out of Food in the Last Year: Never true    Ran Out of Food in the Last Year: Never true  Transportation Needs: No Transportation Needs (12/25/2020)   PRAPARE - Hydrologist (Medical): No    Lack of Transportation (Non-Medical): No  Physical Activity: Insufficiently Active (12/25/2020)   Exercise Vital Sign    Days of Exercise per Week: 3 days    Minutes of Exercise per Session: 30 min  Stress: No Stress Concern Present (12/25/2020)   [place]    Feeling of Stress : Not at all  Social Connections: Moderately Integrated (12/25/2020)   Social Connection and Isolation Panel [NHANES]    Frequency of Communication with Friends and Family: Three times a week    Frequency of Social Gatherings with Friends and Family: Once a week    Attends Religious Services: More than 4 times per year    Active Member of [employer] or Organizations: No    Attends Archivist Meetings: Never    Marital Status: Married  Human resources officer Violence: Not At Risk (12/25/2020)   Humiliation, Afraid, Rape, and Kick questionnaire    Fear of Current or Ex-Partner: No    Emotionally Abused: No    Physically Abused: No    Sexually Abused: No    Review of Systems  All other systems reviewed and are negative.   PHYSICAL EXAMINATION:    BP 128/80   Pulse 85   SpO2 99%     General appearance: alert, cooperative and appears stated age Neck: no adenopathy, supple, symmetrical, trachea midline and  thyroid normal to inspection and palpation Abdomen: soft, non-tender; non distended, no masses,  no organomegaly  Pelvic: External genitalia:  no lesions              Urethra:  normal appearing urethra with no masses, tenderness or lesions              Bartholins and Skenes: normal                 Vagina: normal appearing vagina with normal color and discharge, no lesions              Cervix:  lesion at the external os, unclear if it is a polyp or a cyst, not friable with gentle palpation. Unable to take off with a ringed forceps. Removed with several bites using the cervical biopsy forceps. Not cystic. Base treated with silver nitrate and monsels.               Bimanual Exam:  Uterus:  normal size, contour, position, consistency, mobility, non-tender and anteverted              Adnexa: no mass, fullness, tenderness               Chaperone was present for exam.  1. Postmenopausal bleeding Cervical polyp noted, not friable with gentle palpation - Cytology - PAP - Surgical pathology( [facility]/ [facility]) - US PELVIS TRANSVAGINAL NON-OB (TV ONLY); Future  2. Cervical polyp Removed - Surgical pathology( / [facility])

## 2021-08-24 ENCOUNTER — Other Ambulatory Visit: Payer: Self-pay | Admitting: Hematology & Oncology

## 2021-08-24 DIAGNOSIS — E559 Vitamin D deficiency, unspecified: Secondary | ICD-10-CM

## 2021-08-25 ENCOUNTER — Encounter: Payer: Self-pay | Admitting: Internal Medicine

## 2021-08-25 ENCOUNTER — Encounter: Payer: Self-pay | Admitting: Family

## 2021-08-25 ENCOUNTER — Ambulatory Visit (INDEPENDENT_AMBULATORY_CARE_PROVIDER_SITE_OTHER): Payer: Medicare PPO | Admitting: Internal Medicine

## 2021-08-25 VITALS — BP 130/80 | HR 81 | Temp 98.1°F | Ht 70.25 in | Wt 127.6 lb

## 2021-08-25 DIAGNOSIS — Z8419 Family history of other disorders of kidney and ureter: Secondary | ICD-10-CM

## 2021-08-25 DIAGNOSIS — I10 Essential (primary) hypertension: Secondary | ICD-10-CM

## 2021-08-25 DIAGNOSIS — E118 Type 2 diabetes mellitus with unspecified complications: Secondary | ICD-10-CM

## 2021-08-25 DIAGNOSIS — E785 Hyperlipidemia, unspecified: Secondary | ICD-10-CM | POA: Diagnosis not present

## 2021-08-25 DIAGNOSIS — E119 Type 2 diabetes mellitus without complications: Secondary | ICD-10-CM

## 2021-08-25 DIAGNOSIS — Z0000 Encounter for general adult medical examination without abnormal findings: Secondary | ICD-10-CM | POA: Diagnosis not present

## 2021-08-25 DIAGNOSIS — Z841 Family history of disorders of kidney and ureter: Secondary | ICD-10-CM | POA: Diagnosis not present

## 2021-08-25 DIAGNOSIS — D471 Chronic myeloproliferative disease: Secondary | ICD-10-CM

## 2021-08-25 DIAGNOSIS — Z79899 Other long term (current) drug therapy: Secondary | ICD-10-CM

## 2021-08-25 LAB — MICROALBUMIN / CREATININE URINE RATIO
Creatinine,U: 162 mg/dL
Microalb Creat Ratio: 3.8 mg/g (ref 0.0–30.0)
Microalb, Ur: 6.1 mg/dL — ABNORMAL HIGH (ref 0.0–1.9)

## 2021-09-01 ENCOUNTER — Other Ambulatory Visit (HOSPITAL_COMMUNITY)
Admission: RE | Admit: 2021-09-01 | Discharge: 2021-09-01 | Disposition: A | Discharge status: Home / Self Care | Payer: Medicare PPO | Source: Ambulatory Visit

## 2021-09-01 ENCOUNTER — Encounter: Payer: Self-pay | Admitting: Obstetrics and Gynecology

## 2021-09-01 ENCOUNTER — Other Ambulatory Visit (HOSPITAL_COMMUNITY)
Admission: RE | Admit: 2021-09-01 | Discharge: 2021-09-01 | Disposition: A | Discharge status: Home / Self Care | Payer: Medicare PPO | Source: Ambulatory Visit | Attending: Obstetrics and Gynecology | Admitting: Obstetrics and Gynecology

## 2021-09-01 ENCOUNTER — Ambulatory Visit: Payer: Medicare PPO | Admitting: Obstetrics and Gynecology

## 2021-09-01 VITALS — BP 128/80 | HR 85

## 2021-09-01 DIAGNOSIS — N841 Polyp of cervix uteri: Secondary | ICD-10-CM | POA: Insufficient documentation

## 2021-09-01 DIAGNOSIS — N950 Postmenopausal bleeding: Secondary | ICD-10-CM

## 2021-09-01 DIAGNOSIS — Z124 Encounter for screening for malignant neoplasm of cervix: Secondary | ICD-10-CM | POA: Diagnosis not present

## 2021-09-03 LAB — CYTOLOGY - PAP: Diagnosis: NEGATIVE

## 2021-09-03 LAB — SURGICAL PATHOLOGY

## 2021-09-05 ENCOUNTER — Inpatient Hospital Stay: Payer: Medicare PPO | Attending: Hematology & Oncology

## 2021-09-05 ENCOUNTER — Encounter: Payer: Self-pay | Admitting: Hematology & Oncology

## 2021-09-05 ENCOUNTER — Other Ambulatory Visit: Payer: Self-pay

## 2021-09-05 ENCOUNTER — Inpatient Hospital Stay (HOSPITAL_BASED_OUTPATIENT_CLINIC_OR_DEPARTMENT_OTHER): Payer: Medicare PPO | Admitting: Hematology & Oncology

## 2021-09-05 VITALS — BP 141/69 | HR 81 | Temp 98.0°F | Resp 18 | Ht 70.0 in | Wt 127.1 lb

## 2021-09-05 DIAGNOSIS — E119 Type 2 diabetes mellitus without complications: Secondary | ICD-10-CM

## 2021-09-05 DIAGNOSIS — D631 Anemia in chronic kidney disease: Secondary | ICD-10-CM | POA: Insufficient documentation

## 2021-09-05 DIAGNOSIS — D500 Iron deficiency anemia secondary to blood loss (chronic): Secondary | ICD-10-CM

## 2021-09-05 DIAGNOSIS — Z7982 Long term (current) use of aspirin: Secondary | ICD-10-CM | POA: Insufficient documentation

## 2021-09-05 DIAGNOSIS — D473 Essential (hemorrhagic) thrombocythemia: Secondary | ICD-10-CM | POA: Diagnosis not present

## 2021-09-05 DIAGNOSIS — N189 Chronic kidney disease, unspecified: Secondary | ICD-10-CM | POA: Insufficient documentation

## 2021-09-05 DIAGNOSIS — Z79899 Other long term (current) drug therapy: Secondary | ICD-10-CM | POA: Diagnosis not present

## 2021-09-05 LAB — LACTATE DEHYDROGENASE: LDH: 193 U/L — ABNORMAL HIGH (ref 98–192)

## 2021-09-05 LAB — CMP (CANCER CENTER ONLY)
ALT: 9 U/L (ref 0–44)
AST: 13 U/L — ABNORMAL LOW (ref 15–41)
Albumin: 4.3 g/dL (ref 3.5–5.0)
Alkaline Phosphatase: 68 U/L (ref 38–126)
Anion gap: 7 (ref 5–15)
BUN: 30 mg/dL — ABNORMAL HIGH (ref 8–23)
CO2: 27 mmol/L (ref 22–32)
Calcium: 9.3 mg/dL (ref 8.9–10.3)
Chloride: 106 mmol/L (ref 98–111)
Creatinine: 1.11 mg/dL — ABNORMAL HIGH (ref 0.44–1.00)
GFR, Estimated: 53 mL/min — ABNORMAL LOW (ref >60)
Glucose, Bld: 99 mg/dL (ref 70–99)
Potassium: 3.9 mmol/L (ref 3.5–5.1)
Sodium: 140 mmol/L (ref 135–145)
Total Bilirubin: 0.6 mg/dL (ref 0.3–1.2)
Total Protein: 7 g/dL (ref 6.5–8.1)

## 2021-09-05 LAB — CBC WITH DIFFERENTIAL (CANCER CENTER ONLY)
Abs Immature Granulocytes: 0.02 10*3/uL (ref 0.00–0.07)
Basophils Absolute: 0.1 10*3/uL (ref 0.0–0.1)
Basophils Relative: 1 %
Eosinophils Absolute: 0.2 10*3/uL (ref 0.0–0.5)
Eosinophils Relative: 3 %
HCT: 24.8 % — ABNORMAL LOW (ref 36.0–46.0)
Hemoglobin: 7.6 g/dL — ABNORMAL LOW (ref 12.0–15.0)
Immature Granulocytes: 0 %
Lymphocytes Relative: 25 %
Lymphs Abs: 1.5 10*3/uL (ref 0.7–4.0)
MCH: 24.8 pg — ABNORMAL LOW (ref 26.0–34.0)
MCHC: 30.6 g/dL (ref 30.0–36.0)
MCV: 81 fL (ref 80.0–100.0)
Monocytes Absolute: 0.8 10*3/uL (ref 0.1–1.0)
Monocytes Relative: 14 %
Neutro Abs: 3.4 10*3/uL (ref 1.7–7.7)
Neutrophils Relative %: 57 %
Platelet Count: 394 10*3/uL (ref 150–400)
RBC: 3.06 MIL/uL — ABNORMAL LOW (ref 3.87–5.11)
RDW: 19.5 % — ABNORMAL HIGH (ref 11.5–15.5)
WBC Count: 6.1 10*3/uL (ref 4.0–10.5)
nRBC: 0 % (ref 0.0–0.2)

## 2021-09-05 LAB — HEMOGLOBIN A1C
Hgb A1c MFr Bld: 5.7 % — ABNORMAL HIGH (ref 4.8–5.6)
Mean Plasma Glucose: 116.89 mg/dL

## 2021-09-05 LAB — IRON AND IRON BINDING CAPACITY (CC-WL,HP ONLY)
Iron: 79 ug/dL (ref 28–170)
Saturation Ratios: 35 % — ABNORMAL HIGH (ref 10.4–31.8)
TIBC: 228 ug/dL — ABNORMAL LOW (ref 250–450)
UIBC: 149 ug/dL (ref 148–442)

## 2021-09-05 LAB — SAVE SMEAR(SSMR), FOR PROVIDER SLIDE REVIEW

## 2021-09-05 LAB — FERRITIN: Ferritin: 215 ng/mL (ref 11–307)

## 2021-09-05 NOTE — Progress Notes (Signed)
[name]  It is [employer].  Hematology and Oncology Follow Up Visit  Alexandra [name] [id number] 06-Sep-1950 71 y.o. 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 is back for followup.  She is doing quite well.  We last saw her back in April.  Since then, she has been quite busy.  She is staying active.  She walks quite a bit.  She sounds like she wants to go down to [place].  She wants to go down in a couple weeks to see her granddaughter.  I do not see a problem with her doing this.  She has had no issues with the anagrelide.  She has had no rashes.  There is been no leg swelling.  She has had no bleeding.  Her last iron studies back in April showed a ferritin of 246 with an iron saturation of 36%.  I think the big news is that she has been seen by Dr. Talbert Nan of gynecology.  She has been having some vaginal bleeding.  It sounds that she has a cyst on her cervix.  This can be worked up further.  I think she is going go for a ultrasound.  Hopefully, she will not need any count of surgery.  If she does need surgery, I do not see a problem with this.  She is still quite anemic.  She is totally asymptomatic with her anemia.  She has not noted any obvious change in bowel or bladder habits.    Overall, her performance status is ECOG 1.    Medications:  Current Outpatient Medications:    amLODipine (NORVASC) 10 MG tablet, TAKE 1 TABLET BY MOUTH EVERY DAY, Disp: 30 tablet, Rfl: 0   anagrelide (AGRYLIN) 1 MG capsule, TAKE 5 CAPSULES BY MOUTH DAILY, Disp: 450 capsule, Rfl: 3   aspirin 81 MG tablet, Take 81 mg by mouth daily., Disp: , Rfl:    Fe Fum-FePoly-Vit C-Vit B3 (INTEGRA) 62.5-62.5-40-3 MG  CAPS, TAKE 1 CAPSULE BY MOUTH DAILY, Disp: 30 capsule, Rfl: 8   folic acid (FOLVITE) 1 MG tablet, Take 2 tablets by mouth once daily, Disp: 60 tablet, Rfl: 6   glucose blood (ONE TOUCH ULTRA TEST) test strip, Use to test blood sugar 1 times daily. **PT NEEDS FOLLOW UP APPT FOR FURTHER REFILLS**, Disp: 100 each, Rfl: 0   lisinopril (ZESTRIL) 20 MG tablet, TAKE 2 TABLETS BY MOUTH EVERY DAY, Disp: 180 tablet, Rfl: 3   Nebivolol HCl 20 MG TABS, TAKE 1 TABLET(20 MG) BY MOUTH DAILY, Disp: 90 tablet, Rfl: 0   ONETOUCH DELICA LANCETS 26J MISC, Use to test blood sugar 1 time daily. **PT NEEDS FOLLOW UP APPT FOR FURTHER REFILLS**, Disp: 100 each, Rfl: 1   Vitamin D, Ergocalciferol, (DRISDOL) 1.25 MG (50000 UNIT) CAPS capsule, TAKE 1 CAPSULE BY MOUTH 1 TIME A WEEK, Disp: 12 capsule, Rfl: 2  Allergies: No Known Allergies  Past Medical History, Surgical history, Social history, and Family History were reviewed and updated.  Review of Systems: Review of Systems  All other systems reviewed and are negative. Marland Kitchen  Physical Exam:  height is '5\' 10"'$  (1.778 m) and weight is 127 lb 1.9 oz (  57.7 kg). Her oral temperature is 98 F (36.7 C). Her blood pressure is 141/69 (abnormal) and her pulse is 81. Her respiration is 18 and oxygen saturation is 100%. =jh  Physical Exam Vitals reviewed.  HENT:     Head: Normocephalic and atraumatic.  Eyes:     Pupils: Pupils are equal, round, and reactive to light.  Cardiovascular:     Rate and Rhythm: Normal rate and regular rhythm.     Heart sounds: Normal heart sounds.  Pulmonary:     Effort: Pulmonary effort is normal.     Breath sounds: Normal breath sounds.  Abdominal:     General: Bowel sounds are normal.     Palpations: Abdomen is soft.  Musculoskeletal:        General: No tenderness or deformity. Normal range of motion.     Cervical back: Normal range of motion.  Lymphadenopathy:     Cervical: No cervical adenopathy.  Skin:    General: Skin is warm and dry.      Findings: No erythema or rash.  Neurological:     Mental Status: She is alert and oriented to person, place, and time.  Psychiatric:        Behavior: Behavior normal.        Thought Content: Thought content normal.        Judgment: Judgment normal.      Lab Results  Component Value Date   WBC 6.1 09/05/2021   HGB 7.6 (L) 09/05/2021   HCT 24.8 (L) 09/05/2021   MCV 81.0 09/05/2021   PLT 394 09/05/2021     Chemistry      Component Value Date/Time   NA 140 09/05/2021 0948   NA 144 02/02/2017 1158   NA 140 07/24/2016 1007   K 3.9 09/05/2021 0948   K 3.7 02/02/2017 1158   K 3.6 07/24/2016 1007   CL 106 09/05/2021 0948   CL 103 02/02/2017 1158   CO2 27 09/05/2021 0948   CO2 29 02/02/2017 1158   CO2 26 07/24/2016 1007   BUN 30 (H) 09/05/2021 0948   BUN 20 02/02/2017 1158   BUN 30.3 (H) 07/24/2016 1007   CREATININE 1.11 (H) 09/05/2021 0948   CREATININE 0.9 02/02/2017 1158   CREATININE 1.1 07/24/2016 1007   GLU 129 02/02/2017 0000      Component Value Date/Time   CALCIUM 9.3 09/05/2021 0948   CALCIUM 9.0 02/02/2017 1158   CALCIUM 9.3 07/24/2016 1007   ALKPHOS 68 09/05/2021 0948   ALKPHOS 110 (H) 02/02/2017 1158   ALKPHOS 104 07/24/2016 1007   AST 13 (L) 09/05/2021 0948   AST 13 07/24/2016 1007   ALT 9 09/05/2021 0948   ALT 26 02/02/2017 1158   ALT 15 07/24/2016 1007   BILITOT 0.6 09/05/2021 0948   BILITOT 0.55 07/24/2016 1007      Impression and Plan: Alexandra [name] is 71 year old [name]. She has essential thrombocythemia. She actually is Calreticulin positive.  Everything is doing quite well.  I looked at her blood under the microscope.  Everything looked pretty stable.  I do not see any thing that look like any progression of her essential thrombocythemia.  Again, she is always anemic.  This really does not bother her.  I have offered her ESA which she would like to hold off with.  Again I do not see a problem to her going down to [place].  I  do not see a problem with her having any kind of invasive  procedure for her cervical cyst.  We will plan to get her back to see [place] in another 6 weeks.     [name], MD 7/7/202310:34 AM

## 2021-09-20 ENCOUNTER — Other Ambulatory Visit: Payer: Self-pay | Admitting: Hematology & Oncology

## 2021-09-20 DIAGNOSIS — D508 Other iron deficiency anemias: Secondary | ICD-10-CM

## 2021-09-20 DIAGNOSIS — D473 Essential (hemorrhagic) thrombocythemia: Secondary | ICD-10-CM

## 2021-09-22 ENCOUNTER — Encounter: Payer: Self-pay | Admitting: Family

## 2021-10-02 ENCOUNTER — Ambulatory Visit: Payer: Medicare PPO

## 2021-10-02 ENCOUNTER — Other Ambulatory Visit: Payer: Medicare PPO | Admitting: Obstetrics and Gynecology

## 2021-10-02 DIAGNOSIS — N950 Postmenopausal bleeding: Secondary | ICD-10-CM

## 2021-10-09 ENCOUNTER — Telehealth: Payer: Self-pay | Admitting: Obstetrics and Gynecology

## 2021-10-09 DIAGNOSIS — R9389 Abnormal findings on diagnostic imaging of other specified body structures: Secondary | ICD-10-CM

## 2021-10-09 NOTE — Telephone Encounter (Signed)
Please let the patient know that I have reviewed her ultrasound from last week. She has a thickened endometrium, possible endometrial polyps. She needs to return for an endometrial biopsy. Please explain and set this up.  You can add her on at 4 pm on 10/10/21 if the patient would like to do that.

## 2021-10-10 ENCOUNTER — Encounter: Payer: Self-pay | Admitting: Obstetrics and Gynecology

## 2021-10-10 ENCOUNTER — Ambulatory Visit: Payer: Medicare PPO | Admitting: Obstetrics and Gynecology

## 2021-10-10 ENCOUNTER — Other Ambulatory Visit (HOSPITAL_COMMUNITY)
Admission: RE | Admit: 2021-10-10 | Discharge: 2021-10-10 | Disposition: A | Discharge status: Home / Self Care | Payer: Medicare PPO | Source: Ambulatory Visit | Attending: Obstetrics and Gynecology | Admitting: Obstetrics and Gynecology

## 2021-10-10 VITALS — BP 110/78 | HR 82

## 2021-10-10 DIAGNOSIS — D631 Anemia in chronic kidney disease: Secondary | ICD-10-CM

## 2021-10-10 DIAGNOSIS — Z8639 Personal history of other endocrine, nutritional and metabolic disease: Secondary | ICD-10-CM

## 2021-10-10 DIAGNOSIS — R9389 Abnormal findings on diagnostic imaging of other specified body structures: Secondary | ICD-10-CM | POA: Diagnosis not present

## 2021-10-10 DIAGNOSIS — N950 Postmenopausal bleeding: Secondary | ICD-10-CM | POA: Diagnosis not present

## 2021-10-10 DIAGNOSIS — I10 Essential (primary) hypertension: Secondary | ICD-10-CM

## 2021-10-10 DIAGNOSIS — D473 Essential (hemorrhagic) thrombocythemia: Secondary | ICD-10-CM

## 2021-10-10 NOTE — Telephone Encounter (Signed)
Patient informed she will arrive today at 4pm.

## 2021-10-10 NOTE — Progress Notes (Signed)
GYNECOLOGY  VISIT   HPI: 71 y.o.   Married Black or [place] American Not Hispanic or Latino  female   [phone] with No LMP recorded. Patient is postmenopausal.   here for EMB for PMB and a thickened endometrium. Pt would like to know where bleeding is coming from. Pt reports she is concerned since she is on blood thinner.   GYNECOLOGIC HISTORY: No LMP recorded. Patient is postmenopausal. 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   Myeloproliferative neoplasm ([place]) 12/15/2019   Goals of care, counseling/discussion 12/15/2019   Iron deficiency anemia 03/15/2013   Diabetes mellitus type 2, controlled, without complications ([place]) 75/91/6384   Routine gynecological examination 08/24/2012   ABNORMAL ELECTROCARDIOGRAM 08/30/2008   THROMBOCYTHEMIA 07/31/2008   Vitamin D deficiency 07/31/2008   HYPOKALEMIA, MILD 07/31/2008   HYPERGLYCEMIA 07/31/2008   Essential hypertension 10/18/2006    Past Medical History:  Diagnosis Date   Diabetes mellitus without complication ([place])    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      Fe Fum-FePoly-Vit C-Vit B3 (INTEGRA) 62.5-62.5-40-3 MG CAPS TAKE 1 CAPSULE BY MOUTH DAILY 30 capsule 8   folic acid (FOLVITE) 1 MG tablet Take 2 tablets by mouth once daily 60 tablet 6   glucose blood (ONE TOUCH ULTRA TEST) test strip Use to test blood sugar 1 times daily. **PT NEEDS FOLLOW UP APPT FOR FURTHER REFILLS** 100 each 0   lisinopril (ZESTRIL) 20 MG tablet TAKE 2 TABLETS BY MOUTH EVERY DAY 180 tablet 3   Nebivolol HCl 20 MG TABS TAKE 1 TABLET(20 MG) BY MOUTH DAILY 90 tablet 0   ONETOUCH DELICA LANCETS 66Z MISC Use to test blood sugar 1 time daily. **PT NEEDS FOLLOW UP APPT FOR FURTHER REFILLS** 100 each 1   Vitamin D, Ergocalciferol, (DRISDOL) 1.25 MG (50000 UNIT) CAPS capsule TAKE 1 CAPSULE BY MOUTH 1 TIME A WEEK 12 capsule 2   No current facility-administered medications for this visit.     ALLERGIES: Patient has no known allergies.  Family History  Problem Relation Age of Onset   Heart disease Mother        died age 69   Kidney disease Mother        family hx   Heart attack Father 78  massive   Hypertension Sister    Hypertension Sister    Hypertension Sister    Hypertension Sister    Arthritis Other        family hx   Diabetes Other        family hx   Stroke Other         dialysis    Social History   Socioeconomic History   Marital status: Married    Spouse name: Not on file   Number of children: Not on file   Years of education: Not on file   Highest education level: Not on file  Occupational History   Occupation: retired  Tobacco Use   Smoking status: Never   Smokeless tobacco: Never   Tobacco comments:    never used tobacco  Vaping Use   Vaping Use: Never used  Substance and Sexual Activity   Alcohol use: No    Alcohol/week: 0.0 standard drinks of alcohol   Drug use: No   Sexual activity: Yes    Partners: Male    Birth control/protection: Post-menopausal  Other Topics Concern   Not on file  Social History Narrative   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   No tobacco no ethoh little caffiene .    Social Determinants of Health   Financial Resource Strain: Low Risk  (12/25/2020)   Overall Financial Resource Strain (CARDIA)    Difficulty of Paying Living Expenses: Not hard at all  Food Insecurity: No Food Insecurity (12/25/2020)   Hunger Vital Sign    Worried About Running Out of Food in the Last Year: Never true    Ran Out of Food in the Last Year: Never true  Transportation Needs: No Transportation Needs (12/25/2020)   PRAPARE - Hydrologist (Medical): No    Lack of Transportation (Non-Medical): No  Physical Activity: Insufficiently Active (12/25/2020)   Exercise Vital Sign    Days of Exercise per Week: 3 days    Minutes of Exercise per Session: 30 min  Stress: No Stress Concern Present (12/25/2020)   [place]    Feeling of Stress : Not at all  Social Connections: Moderately Integrated (12/25/2020)   Social Connection and Isolation Panel [NHANES]    Frequency of Communication with Friends and Family: Three times a week    Frequency of Social Gatherings with Friends and Family: Once a week    Attends Religious Services: More than 4 times per year    Active Member of [employer] or Organizations: No    Attends Archivist Meetings: Never    Marital Status: Married  Human resources officer Violence: Not At Risk (12/25/2020)   Humiliation, Afraid, Rape, and Kick questionnaire    Fear of Current or Ex-Partner: No    Emotionally Abused: No    Physically Abused: No    Sexually Abused: No    Review of Systems  All other systems reviewed and are negative.   PHYSICAL EXAMINATION:    BP 110/78   Pulse 82   SpO2 98%     General appearance: alert, cooperative and appears stated age Neck: no adenopathy, supple, symmetrical, trachea midline and thyroid normal to inspection and  palpation Heart: regular rate and rhythm Lungs: CTAB Abdomen: soft, non-tender; bowel sounds normal; no masses,  no organomegaly Extremities: normal, atraumatic, no cyanosis  Skin: normal color, texture and turgor, no rashes or lesions Lymph: normal cervical supraclavicular and inguinal nodes Neurologic: grossly normal   Pelvic: External genitalia:  no lesions              Urethra:  normal appearing urethra with no masses, tenderness or lesions              Bartholins and Skenes: normal                 Vagina: normal appearing vagina with normal color and discharge, no lesions              Cervix: no lesions                The risks of endometrial biopsy were reviewed and a consent was obtained.  A speculum was placed in the vagina and the cervix was cleansed with betadine. A tenaculum was placed on the cervix and the uterine evacuator was placed into the endometrial cavity. The uterus sounded to 7-8 cm. The endometrial biopsy was performed, taking care to get a representative sample, sampling 360 degrees of the uterine cavity. Minimal tissue was obtained. The tenaculum and speculum were removed. There were no complications.   Chaperone was present for exam.  1. Postmenopausal bleeding Thickened endometrium, concerning for endometrial polyps - Surgical pathology( [facility]) -Given the thickening of the endometrium and the minimal tissue on biopsy this goes along with endometrial polyps. Will await pathology report, but patient was counseled about hysteroscopy, D&C. Risks were reviewed. Preop was done  2. Thickened endometrium - Endometrial biopsy - Surgical pathology( [facility]/ [facility])  3. THROMBOCYTHEMIA On Agrylin and ASA, managed by Dr Marin Olp, will get his advice about medication management in regards to hysteroscopy, D&C  4. Erythropoietin deficiency anemia Asymptomatic anemia, followed by Dr Marin Olp  5. Essential hypertension Controlled on  medication  6. History of diabetes mellitus Last HgbA1C was 5.7 (09/05/21)  CC: Dr Marin Olp, Dr Regis Bill  In addition to the biopsy, time was spent in counseling and management

## 2021-10-10 NOTE — Patient Instructions (Signed)

## 2021-10-13 ENCOUNTER — Other Ambulatory Visit: Payer: Self-pay | Admitting: Internal Medicine

## 2021-10-13 DIAGNOSIS — Z1231 Encounter for screening mammogram for malignant neoplasm of breast: Secondary | ICD-10-CM

## 2021-10-14 ENCOUNTER — Telehealth: Payer: Self-pay | Admitting: Obstetrics and Gynecology

## 2021-10-14 LAB — SURGICAL PATHOLOGY

## 2021-10-14 NOTE — Telephone Encounter (Signed)
Surgery: CPT [phone] - Hysteroscopy/D&C/Myosure,   Diagnosis: N95.0 Postmenopausal Bleeding, N84.0 Endometrial Polyp,   Location: [facility]  Status: Outpatient  Time: 56 Minutes  Assistant: N/A  Urgency: First Available  Pre-Op Appointment: Completed  Post-Op Appointment(s): 1 Week,   Time Out Of Work: Day Of Surgery, 1 Day Post Op   She needs to hold her agrylin for 3-4 days prior to surgery.  I sent the patient a mychart message with pathology report. When she was here we discussed the likely need for hysteroscopy and a preop was done.

## 2021-10-16 NOTE — Telephone Encounter (Signed)
Spoke with patient. Reviewed surgery dates. Patient request to proceed with surgery on 11/18/21, declines earlier dates.  Advised patient I will forward to business office for return call. I will return call once surgery date and time confirmed. Patient verbalizes understanding and is agreeable.   Surgery request sent.

## 2021-10-19 ENCOUNTER — Other Ambulatory Visit: Payer: Self-pay | Admitting: Internal Medicine

## 2021-10-22 NOTE — Telephone Encounter (Signed)
Left message to call Tayvion Lauder, RN at [facility], [phone].  

## 2021-10-24 ENCOUNTER — Encounter: Payer: Self-pay | Admitting: Hematology & Oncology

## 2021-10-24 ENCOUNTER — Other Ambulatory Visit: Payer: Self-pay

## 2021-10-24 ENCOUNTER — Inpatient Hospital Stay: Payer: Medicare PPO | Attending: Hematology & Oncology

## 2021-10-24 ENCOUNTER — Inpatient Hospital Stay (HOSPITAL_BASED_OUTPATIENT_CLINIC_OR_DEPARTMENT_OTHER): Payer: Medicare PPO | Admitting: Hematology & Oncology

## 2021-10-24 VITALS — BP 135/66 | HR 84 | Temp 98.2°F | Resp 18 | Ht 70.0 in | Wt 128.1 lb

## 2021-10-24 DIAGNOSIS — Z79899 Other long term (current) drug therapy: Secondary | ICD-10-CM | POA: Insufficient documentation

## 2021-10-24 DIAGNOSIS — D500 Iron deficiency anemia secondary to blood loss (chronic): Secondary | ICD-10-CM | POA: Diagnosis not present

## 2021-10-24 DIAGNOSIS — N189 Chronic kidney disease, unspecified: Secondary | ICD-10-CM | POA: Diagnosis not present

## 2021-10-24 DIAGNOSIS — D75839 Thrombocytosis, unspecified: Secondary | ICD-10-CM | POA: Diagnosis not present

## 2021-10-24 DIAGNOSIS — D473 Essential (hemorrhagic) thrombocythemia: Secondary | ICD-10-CM

## 2021-10-24 DIAGNOSIS — Z7982 Long term (current) use of aspirin: Secondary | ICD-10-CM | POA: Insufficient documentation

## 2021-10-24 DIAGNOSIS — D631 Anemia in chronic kidney disease: Secondary | ICD-10-CM | POA: Diagnosis not present

## 2021-10-24 LAB — CBC WITH DIFFERENTIAL (CANCER CENTER ONLY)
Abs Immature Granulocytes: 0.09 10*3/uL — ABNORMAL HIGH (ref 0.00–0.07)
Basophils Absolute: 0.1 10*3/uL (ref 0.0–0.1)
Basophils Relative: 1 %
Eosinophils Absolute: 0.2 10*3/uL (ref 0.0–0.5)
Eosinophils Relative: 2 %
HCT: 26.2 % — ABNORMAL LOW (ref 36.0–46.0)
Hemoglobin: 8 g/dL — ABNORMAL LOW (ref 12.0–15.0)
Immature Granulocytes: 1 %
Lymphocytes Relative: 24 %
Lymphs Abs: 1.7 10*3/uL (ref 0.7–4.0)
MCH: 25.1 pg — ABNORMAL LOW (ref 26.0–34.0)
MCHC: 30.5 g/dL (ref 30.0–36.0)
MCV: 82.1 fL (ref 80.0–100.0)
Monocytes Absolute: 0.8 10*3/uL (ref 0.1–1.0)
Monocytes Relative: 12 %
Neutro Abs: 4.2 10*3/uL (ref 1.7–7.7)
Neutrophils Relative %: 60 %
Platelet Count: 457 10*3/uL — ABNORMAL HIGH (ref 150–400)
RBC: 3.19 MIL/uL — ABNORMAL LOW (ref 3.87–5.11)
RDW: 19.6 % — ABNORMAL HIGH (ref 11.5–15.5)
WBC Count: 7 10*3/uL (ref 4.0–10.5)
nRBC: 0 % (ref 0.0–0.2)

## 2021-10-24 LAB — CMP (CANCER CENTER ONLY)
ALT: 11 U/L (ref 0–44)
AST: 13 U/L — ABNORMAL LOW (ref 15–41)
Albumin: 4.3 g/dL (ref 3.5–5.0)
Alkaline Phosphatase: 80 U/L (ref 38–126)
Anion gap: 5 (ref 5–15)
BUN: 28 mg/dL — ABNORMAL HIGH (ref 8–23)
CO2: 29 mmol/L (ref 22–32)
Calcium: 9.4 mg/dL (ref 8.9–10.3)
Chloride: 107 mmol/L (ref 98–111)
Creatinine: 1.16 mg/dL — ABNORMAL HIGH (ref 0.44–1.00)
GFR, Estimated: 50 mL/min — ABNORMAL LOW (ref >60)
Glucose, Bld: 114 mg/dL — ABNORMAL HIGH (ref 70–99)
Potassium: 3.8 mmol/L (ref 3.5–5.1)
Sodium: 141 mmol/L (ref 135–145)
Total Bilirubin: 0.6 mg/dL (ref 0.3–1.2)
Total Protein: 7.4 g/dL (ref 6.5–8.1)

## 2021-10-24 LAB — FERRITIN: Ferritin: 211 ng/mL (ref 11–307)

## 2021-10-24 LAB — LACTATE DEHYDROGENASE: LDH: 223 U/L — ABNORMAL HIGH (ref 98–192)

## 2021-10-24 LAB — RETICULOCYTES
Immature Retic Fract: 16.1 % — ABNORMAL HIGH (ref 2.3–15.9)
RBC.: 3.14 MIL/uL — ABNORMAL LOW (ref 3.87–5.11)
Retic Count, Absolute: 35.2 10*3/uL (ref 19.0–186.0)
Retic Ct Pct: 1.1 % (ref 0.4–3.1)

## 2021-10-24 LAB — SAVE SMEAR(SSMR), FOR PROVIDER SLIDE REVIEW

## 2021-10-24 NOTE — Progress Notes (Signed)
[name]  [employer] is [employer].  Hematology and Oncology Follow Up Visit  JOJO GEVING [id number] Jul 17, 1950 71 y.o. 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  Else is back for followup.  As always, she has been quite busy.  She just got back from [place], [place].  She was down there for a funeral.  I just hate that her family is starting to decrease in their numbers.  I know that she is very close to all of her family.  She has had no problems with the anagrelide.  Her platelet count is up a little bit today but I would not make any changes to the dose.  She is supposed to have surgery to remove a polyp from the vaginal area.  I told her that she can stop the anagrelide about 3 days before hand.  I would not think that there should be any issue with her bleeding.  I know she is on baby aspirin.  We can also make sure she stops this.  She has had no fever.  There is been no cough or shortness of breath.  She has had no change in bowel or bladder habits.  She has had no rashes.  There is been no leg swelling.  Overall, I would say performance status is probably ECOG 1.    Medications:  Current Outpatient Medications:    amLODipine (NORVASC) 10 MG tablet, TAKE 1 TABLET BY MOUTH EVERY DAY, Disp: 30 tablet, Rfl: 0   anagrelide (AGRYLIN) 1 MG capsule, TAKE 5 CAPSULES BY MOUTH DAILY, Disp: 450 capsule, Rfl: 3   aspirin 81 MG tablet, Take 81 mg by mouth daily., Disp: , Rfl:    Fe Fum-FePoly-Vit C-Vit B3 (INTEGRA) 62.5-62.5-40-3 MG CAPS, TAKE 1 CAPSULE BY MOUTH DAILY, Disp: 30 capsule, Rfl: 8   folic acid (FOLVITE) 1 MG tablet, Take 2 tablets by mouth once daily, Disp: 60 tablet, Rfl: 6    glucose blood (ONE TOUCH ULTRA TEST) test strip, Use to test blood sugar 1 times daily. **PT NEEDS FOLLOW UP APPT FOR FURTHER REFILLS**, Disp: 100 each, Rfl: 0   lisinopril (ZESTRIL) 20 MG tablet, TAKE 2 TABLETS BY MOUTH EVERY DAY, Disp: 180 tablet, Rfl: 3   Nebivolol HCl 20 MG TABS, TAKE 1 TABLET(20 MG) BY MOUTH DAILY, Disp: 90 tablet, Rfl: 0   ONETOUCH DELICA LANCETS 14N MISC, Use to test blood sugar 1 time daily. **PT NEEDS FOLLOW UP APPT FOR FURTHER REFILLS**, Disp: 100 each, Rfl: 1   Vitamin D, Ergocalciferol, (DRISDOL) 1.25 MG (50000 UNIT) CAPS capsule, TAKE 1 CAPSULE BY MOUTH 1 TIME A WEEK, Disp: 12 capsule, Rfl: 2  Allergies: No Known Allergies  Past Medical History, Surgical history, Social history, and Family History were reviewed and updated.  Review of Systems: Review of Systems  All other systems reviewed and are negative. Marland Kitchen  Physical Exam:  height is '5\' 10"'$  (1.778 m) and weight is 128 lb 1.9 oz (58.1 kg). Her oral temperature is 98.2 F (36.8 C). Her blood pressure is 135/66 and her pulse is 84. Her respiration is 18 and oxygen saturation is 100%. =jh  Physical Exam Vitals reviewed.  HENT:  Head: Normocephalic and atraumatic.  Eyes:     Pupils: Pupils are equal, round, and reactive to light.  Cardiovascular:     Rate and Rhythm: Normal rate and regular rhythm.     Heart sounds: Normal heart sounds.  Pulmonary:     Effort: Pulmonary effort is normal.     Breath sounds: Normal breath sounds.  Abdominal:     General: Bowel sounds are normal.     Palpations: Abdomen is soft.  Musculoskeletal:        General: No tenderness or deformity. Normal range of motion.     Cervical back: Normal range of motion.  Lymphadenopathy:     Cervical: No cervical adenopathy.  Skin:    General: Skin is warm and dry.     Findings: No erythema or rash.  Neurological:     Mental Status: She is alert and oriented to person, place, and time.  Psychiatric:        Behavior: Behavior  normal.        Thought Content: Thought content normal.        Judgment: Judgment normal.      Lab Results  Component Value Date   WBC 7.0 10/24/2021   HGB 8.0 (L) 10/24/2021   HCT 26.2 (L) 10/24/2021   MCV 82.1 10/24/2021   PLT 457 (H) 10/24/2021     Chemistry      Component Value Date/Time   NA 141 10/24/2021 1014   NA 144 02/02/2017 1158   NA 140 07/24/2016 1007   K 3.8 10/24/2021 1014   K 3.7 02/02/2017 1158   K 3.6 07/24/2016 1007   CL 107 10/24/2021 1014   CL 103 02/02/2017 1158   CO2 29 10/24/2021 1014   CO2 29 02/02/2017 1158   CO2 26 07/24/2016 1007   BUN 28 (H) 10/24/2021 1014   BUN 20 02/02/2017 1158   BUN 30.3 (H) 07/24/2016 1007   CREATININE 1.16 (H) 10/24/2021 1014   CREATININE 0.9 02/02/2017 1158   CREATININE 1.1 07/24/2016 1007   GLU 129 02/02/2017 0000      Component Value Date/Time   CALCIUM 9.4 10/24/2021 1014   CALCIUM 9.0 02/02/2017 1158   CALCIUM 9.3 07/24/2016 1007   ALKPHOS 80 10/24/2021 1014   ALKPHOS 110 (H) 02/02/2017 1158   ALKPHOS 104 07/24/2016 1007   AST 13 (L) 10/24/2021 1014   AST 13 07/24/2016 1007   ALT 11 10/24/2021 1014   ALT 26 02/02/2017 1158   ALT 15 07/24/2016 1007   BILITOT 0.6 10/24/2021 1014   BILITOT 0.55 07/24/2016 1007      Impression and Plan: Ms. Colon is 71 year old [name]. She has essential thrombocythemia. She actually is Calreticulin positive.  Everything is doing quite well.  I looked at her blood under the microscope.  Everything looked pretty stable.  I do not see any thing that look like any progression of her essential thrombocythemia.  I am glad to see that her white cell count is stable and that her hemoglobin is a little bit better.  She is always anemic.  She does not wish to have any ESA.  Her iron studies avoids been fine.  When we last saw her back in July, her ferritin was 215 with an iron saturation of 35%.  For right now, we will still plan to get her back in 6 weeks.   Hopefully, we can work her schedule so that we can try to get her through the holiday season  without having to see her.    [name], MD 8/25/202311:30 AM

## 2021-10-27 NOTE — Telephone Encounter (Signed)
Spoke with patient. Surgery date request confirmed.  Advised surgery is scheduled for 11/18/21, [facility] at 0950.  Surgery instruction sheet and hospital brochure reviewed, printed copy will be mailed.  Patient verbalizes understanding and is agreeable.   Routing to [employer] for benefits.

## 2021-10-28 ENCOUNTER — Ambulatory Visit (INDEPENDENT_AMBULATORY_CARE_PROVIDER_SITE_OTHER): Payer: Medicare PPO | Admitting: Obstetrics and Gynecology

## 2021-10-28 ENCOUNTER — Encounter: Payer: Self-pay | Admitting: Obstetrics and Gynecology

## 2021-10-28 VITALS — BP 134/72 | HR 77 | Wt 129.0 lb

## 2021-10-28 DIAGNOSIS — Z8639 Personal history of other endocrine, nutritional and metabolic disease: Secondary | ICD-10-CM

## 2021-10-28 DIAGNOSIS — I10 Essential (primary) hypertension: Secondary | ICD-10-CM

## 2021-10-28 DIAGNOSIS — D631 Anemia in chronic kidney disease: Secondary | ICD-10-CM | POA: Diagnosis not present

## 2021-10-28 DIAGNOSIS — D473 Essential (hemorrhagic) thrombocythemia: Secondary | ICD-10-CM | POA: Diagnosis not present

## 2021-10-28 DIAGNOSIS — R9389 Abnormal findings on diagnostic imaging of other specified body structures: Secondary | ICD-10-CM

## 2021-10-28 DIAGNOSIS — N950 Postmenopausal bleeding: Secondary | ICD-10-CM

## 2021-10-28 NOTE — Progress Notes (Signed)
GYNECOLOGY  VISIT   HPI: 71 y.o.   Married Black or [place] American Not Hispanic or Latino  female   [phone] with No LMP recorded. Patient is postmenopausal.   here for pre op consult. The patient has had PMP bleeding, thickened endometrium on ultrasound concerning for polyps. Endometrial biopsy c/w benign endometrial polyp.   GYNECOLOGIC HISTORY: No LMP recorded. Patient is postmenopausal. 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   Goals of care, counseling/discussion 12/15/2019   Iron deficiency anemia 03/15/2013   Diabetes mellitus type 2, controlled, without complications ([place]) 27/78/2423   Routine gynecological examination 08/24/2012   ABNORMAL ELECTROCARDIOGRAM 08/30/2008   THROMBOCYTHEMIA 07/31/2008   Vitamin D deficiency 07/31/2008   HYPOKALEMIA, MILD 07/31/2008   HYPERGLYCEMIA 07/31/2008   Essential hypertension 10/18/2006    Past Medical History:  Diagnosis Date   Diabetes mellitus without complication ([place])    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     Fe Fum-FePoly-Vit C-Vit B3 (INTEGRA) 62.5-62.5-40-3 MG CAPS TAKE 1 CAPSULE BY MOUTH DAILY 30 capsule 8   folic acid (FOLVITE) 1 MG tablet Take 2 tablets by mouth once daily 60 tablet 6   glucose blood (ONE TOUCH ULTRA TEST) test strip Use to test blood sugar 1 times daily. **PT NEEDS FOLLOW UP APPT FOR FURTHER REFILLS** 100 each 0   lisinopril (ZESTRIL) 20 MG tablet TAKE 2 TABLETS BY MOUTH EVERY DAY 180 tablet 3   Nebivolol HCl 20 MG TABS TAKE 1 TABLET(20 MG) BY MOUTH DAILY 90 tablet 0   ONETOUCH DELICA LANCETS 53I MISC Use to test blood sugar 1 time daily. **PT NEEDS FOLLOW UP APPT FOR FURTHER REFILLS** 100 each 1   Vitamin D, Ergocalciferol, (DRISDOL) 1.25 MG (50000 UNIT) CAPS capsule TAKE 1 CAPSULE BY MOUTH 1 TIME A WEEK 12 capsule 2   No current facility-administered medications for this visit.     ALLERGIES: Patient has no known allergies.  Family History  Problem Relation Age of Onset   Heart disease Mother        died age 63   Kidney disease Mother        family hx   Heart attack Father 50       massive  Hypertension Sister    Hypertension Sister    Hypertension Sister    Hypertension Sister    Arthritis Other        family hx   Diabetes Other        family hx   Stroke Other         dialysis    Social History   Socioeconomic History   Marital status: Married    Spouse name: Not on file   Number of children: Not on file   Years of education: Not on file   Highest education level: Not on file  Occupational History   Occupation: retired  Tobacco Use   Smoking status: Never   Smokeless tobacco: Never   Tobacco comments:    never used tobacco  Vaping Use   Vaping Use: Never used  Substance and Sexual Activity   Alcohol use: No    Alcohol/week: 0.0 standard drinks of alcohol   Drug use: No   Sexual activity: Yes    Partners: Male    Birth control/protection: Post-menopausal  Other Topics Concern   Not on file  Social History Narrative   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   No tobacco no ethoh little caffiene .    Social Determinants of Health   Financial Resource Strain: Low Risk  (12/25/2020)   Overall Financial Resource Strain (CARDIA)    Difficulty of Paying Living Expenses: Not hard at all  Food Insecurity: No Food Insecurity (12/25/2020)   Hunger Vital Sign    Worried About Running Out of Food in the Last Year: Never true    Ran Out of Food in the Last Year: Never true  Transportation Needs: No Transportation Needs (12/25/2020)   PRAPARE - Hydrologist (Medical): No    Lack of Transportation (Non-Medical): No  Physical Activity: Insufficiently Active (12/25/2020)   Exercise Vital Sign    Days of Exercise per Week: 3 days    Minutes of Exercise per Session: 30 min  Stress: No Stress Concern Present (12/25/2020)   [place]    Feeling of Stress : Not at all  Social Connections: Moderately Integrated (12/25/2020)   Social Connection and Isolation Panel [NHANES]    Frequency of Communication with Friends and Family: Three times a week    Frequency of Social Gatherings with Friends and Family: Once a week    Attends Religious Services: More than 4 times per year    Active Member of [employer] or Organizations: No    Attends Archivist Meetings: Never    Marital Status: Married  Human resources officer Violence: Not At Risk (12/25/2020)   Humiliation, Afraid, Rape, and Kick questionnaire    Fear of Current or Ex-Partner: No    Emotionally Abused: No    Physically Abused: No    Sexually Abused: No    Review of Systems  All other systems reviewed and are negative.   PHYSICAL EXAMINATION:    BP 134/72   Pulse 77   Wt 129 lb (58.5 kg)   SpO2 99%   BMI 18.51 kg/m     General appearance: alert, cooperative and appears stated age Neck: no adenopathy, supple, symmetrical,  trachea midline and thyroid normal to inspection and palpation Heart: regular rate and rhythm Lungs: CTAB Abdomen: soft, non-tender; bowel sounds normal; no  masses,  no organomegaly Extremities: normal, atraumatic, no cyanosis Skin: normal color, texture and turgor, no rashes or lesions Lymph: normal cervical supraclavicular and inguinal nodes Neurologic: grossly normal  1. Postmenopausal bleeding Thickened endometrium on u/s, biopsy c/w polyps Plan: hysteroscopy, dilation and curettage. Reviewed risks, including: bleeding, infection, uterine perforation, fluid overload, need for further surgery  2. Thickened endometrium  3. THROMBOCYTHEMIA Managed by Dr Marin Olp, he recommended holding her agrylin for 3-4 days prior to surgery -will hold ASA for one week  4. Erythropoietin deficiency anemia Asymptomatic anemia, followed by Dr Marin Olp  5. Essential hypertension Controlled on medication  6. History of diabetes mellitus Last HgbA1C was 5.7 (09/05/21)

## 2021-10-28 NOTE — H&P (View-Only) (Signed)
GYNECOLOGY  VISIT   HPI: 71 y.o.   Married Black or [place] American Not Hispanic or Latino  female   [phone] with No LMP recorded. Patient is postmenopausal.   here for pre op consult. The patient has had PMP bleeding, thickened endometrium on ultrasound concerning for polyps. Endometrial biopsy c/w benign endometrial polyp.   GYNECOLOGIC HISTORY: No LMP recorded. Patient is postmenopausal. 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   Goals of care, counseling/discussion 12/15/2019   Iron deficiency anemia 03/15/2013   Diabetes mellitus type 2, controlled, without complications ([place]) 27/78/2423   Routine gynecological examination 08/24/2012   ABNORMAL ELECTROCARDIOGRAM 08/30/2008   THROMBOCYTHEMIA 07/31/2008   Vitamin D deficiency 07/31/2008   HYPOKALEMIA, MILD 07/31/2008   HYPERGLYCEMIA 07/31/2008   Essential hypertension 10/18/2006    Past Medical History:  Diagnosis Date   Diabetes mellitus without complication ([place])    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     Fe Fum-FePoly-Vit C-Vit B3 (INTEGRA) 62.5-62.5-40-3 MG CAPS TAKE 1 CAPSULE BY MOUTH DAILY 30 capsule 8   folic acid (FOLVITE) 1 MG tablet Take 2 tablets by mouth once daily 60 tablet 6   glucose blood (ONE TOUCH ULTRA TEST) test strip Use to test blood sugar 1 times daily. **PT NEEDS FOLLOW UP APPT FOR FURTHER REFILLS** 100 each 0   lisinopril (ZESTRIL) 20 MG tablet TAKE 2 TABLETS BY MOUTH EVERY DAY 180 tablet 3   Nebivolol HCl 20 MG TABS TAKE 1 TABLET(20 MG) BY MOUTH DAILY 90 tablet 0   ONETOUCH DELICA LANCETS 53I MISC Use to test blood sugar 1 time daily. **PT NEEDS FOLLOW UP APPT FOR FURTHER REFILLS** 100 each 1   Vitamin D, Ergocalciferol, (DRISDOL) 1.25 MG (50000 UNIT) CAPS capsule TAKE 1 CAPSULE BY MOUTH 1 TIME A WEEK 12 capsule 2   No current facility-administered medications for this visit.     ALLERGIES: Patient has no known allergies.  Family History  Problem Relation Age of Onset   Heart disease Mother        died age 63   Kidney disease Mother        family hx   Heart attack Father 50       massive  Hypertension Sister    Hypertension Sister    Hypertension Sister    Hypertension Sister    Arthritis Other        family hx   Diabetes Other        family hx   Stroke Other         dialysis    Social History   Socioeconomic History   Marital status: Married    Spouse name: Not on file   Number of children: Not on file   Years of education: Not on file   Highest education level: Not on file  Occupational History   Occupation: retired  Tobacco Use   Smoking status: Never   Smokeless tobacco: Never   Tobacco comments:    never used tobacco  Vaping Use   Vaping Use: Never used  Substance and Sexual Activity   Alcohol use: No    Alcohol/week: 0.0 standard drinks of alcohol   Drug use: No   Sexual activity: Yes    Partners: Male    Birth control/protection: Post-menopausal  Other Topics Concern   Not on file  Social History Narrative   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   No tobacco no ethoh little caffiene .    Social Determinants of Health   Financial Resource Strain: Low Risk  (12/25/2020)   Overall Financial Resource Strain (CARDIA)    Difficulty of Paying Living Expenses: Not hard at all  Food Insecurity: No Food Insecurity (12/25/2020)   Hunger Vital Sign    Worried About Running Out of Food in the Last Year: Never true    Ran Out of Food in the Last Year: Never true  Transportation Needs: No Transportation Needs (12/25/2020)   PRAPARE - Hydrologist (Medical): No    Lack of Transportation (Non-Medical): No  Physical Activity: Insufficiently Active (12/25/2020)   Exercise Vital Sign    Days of Exercise per Week: 3 days    Minutes of Exercise per Session: 30 min  Stress: No Stress Concern Present (12/25/2020)   [place]    Feeling of Stress : Not at all  Social Connections: Moderately Integrated (12/25/2020)   Social Connection and Isolation Panel [NHANES]    Frequency of Communication with Friends and Family: Three times a week    Frequency of Social Gatherings with Friends and Family: Once a week    Attends Religious Services: More than 4 times per year    Active Member of [employer] or Organizations: No    Attends Archivist Meetings: Never    Marital Status: Married  Human resources officer Violence: Not At Risk (12/25/2020)   Humiliation, Afraid, Rape, and Kick questionnaire    Fear of Current or Ex-Partner: No    Emotionally Abused: No    Physically Abused: No    Sexually Abused: No    Review of Systems  All other systems reviewed and are negative.   PHYSICAL EXAMINATION:    BP 134/72   Pulse 77   Wt 129 lb (58.5 kg)   SpO2 99%   BMI 18.51 kg/m     General appearance: alert, cooperative and appears stated age Neck: no adenopathy, supple, symmetrical,  trachea midline and thyroid normal to inspection and palpation Heart: regular rate and rhythm Lungs: CTAB Abdomen: soft, non-tender; bowel sounds normal; no  masses,  no organomegaly Extremities: normal, atraumatic, no cyanosis Skin: normal color, texture and turgor, no rashes or lesions Lymph: normal cervical supraclavicular and inguinal nodes Neurologic: grossly normal  1. Postmenopausal bleeding Thickened endometrium on u/s, biopsy c/w polyps Plan: hysteroscopy, dilation and curettage. Reviewed risks, including: bleeding, infection, uterine perforation, fluid overload, need for further surgery  2. Thickened endometrium  3. THROMBOCYTHEMIA Managed by Dr Marin Olp, he recommended holding her agrylin for 3-4 days prior to surgery -will hold ASA for one week  4. Erythropoietin deficiency anemia Asymptomatic anemia, followed by Dr Marin Olp  5. Essential hypertension Controlled on medication  6. History of diabetes mellitus Last HgbA1C was 5.7 (09/05/21)

## 2021-10-28 NOTE — Telephone Encounter (Signed)
Spoke with patient regarding surgery benefits. Patient acknowledges understanding of information presented. Patient is aware that benefits presented are professional benefits only. Patient is aware the hospital will call with facility benefits. See account note.    

## 2021-10-30 ENCOUNTER — Other Ambulatory Visit: Payer: Self-pay

## 2021-10-30 ENCOUNTER — Encounter (HOSPITAL_BASED_OUTPATIENT_CLINIC_OR_DEPARTMENT_OTHER): Payer: Self-pay | Admitting: Obstetrics and Gynecology

## 2021-10-30 NOTE — Progress Notes (Signed)
Spoke w/ via phone for pre-op interview---pt  Lab needs dos----CBC, I-stat and EKG             Lab results------ COVID test -----patient states asymptomatic no test needed Arrive at -------0745 NPO after MN NO Solid Food.  Clear liquids from MN until---0645 Med rec completed Medications to take morning of surgery -----Nebivolol and amolodipine Diabetic medication -----n/a  Patient instructed no nail polish to be worn day of surgery Patient instructed to bring photo id and insurance card day of surgery Patient aware to have Driver (ride ) / caregiver  husband Jashira Cotugno  for 24 hours after surgery  Patient Special Instructions ----- Pre-Op special Istructions -----per Dr. Marin Olp stop Agrylin3 days before surgery date.  Per pt she was told to stop aspirin 81 mg 3 days before surgery.  Patient verbalized understanding of instructions that were given at this phone interview. Patient denies shortness of breath, chest pain, fever, cough at this phone interview.   Dr. Therisa Doyne MDA, notified of pt Hgb was 8.0 on 10/24/2021, wants CBC day of surgery.  Dr. 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 Orders received to repeat CBC on DOS

## 2021-11-07 ENCOUNTER — Ambulatory Visit
Admission: RE | Admit: 2021-11-07 | Discharge: 2021-11-07 | Disposition: A | Discharge status: Home / Self Care | Payer: Medicare PPO | Source: Ambulatory Visit | Attending: Internal Medicine | Admitting: Internal Medicine

## 2021-11-07 DIAGNOSIS — Z1231 Encounter for screening mammogram for malignant neoplasm of breast: Secondary | ICD-10-CM

## 2021-11-11 ENCOUNTER — Other Ambulatory Visit: Payer: Self-pay | Admitting: Internal Medicine

## 2021-11-11 DIAGNOSIS — R928 Other abnormal and inconclusive findings on diagnostic imaging of breast: Secondary | ICD-10-CM

## 2021-11-18 ENCOUNTER — Ambulatory Visit (HOSPITAL_BASED_OUTPATIENT_CLINIC_OR_DEPARTMENT_OTHER): Payer: Medicare PPO | Admitting: Anesthesiology

## 2021-11-18 ENCOUNTER — Other Ambulatory Visit: Payer: Self-pay

## 2021-11-18 ENCOUNTER — Encounter (HOSPITAL_BASED_OUTPATIENT_CLINIC_OR_DEPARTMENT_OTHER): Payer: Self-pay | Admitting: Obstetrics and Gynecology

## 2021-11-18 ENCOUNTER — Encounter (HOSPITAL_BASED_OUTPATIENT_CLINIC_OR_DEPARTMENT_OTHER)
Admission: RE | Disposition: A | Discharge status: Home / Self Care | Payer: Self-pay | Source: Home / Self Care | Attending: Obstetrics and Gynecology

## 2021-11-18 ENCOUNTER — Ambulatory Visit (HOSPITAL_BASED_OUTPATIENT_CLINIC_OR_DEPARTMENT_OTHER)
Admission: RE | Admit: 2021-11-18 | Discharge: 2021-11-18 | Disposition: A | Discharge status: Home / Self Care | Payer: Medicare PPO | Attending: Obstetrics and Gynecology | Admitting: Obstetrics and Gynecology

## 2021-11-18 DIAGNOSIS — N950 Postmenopausal bleeding: Secondary | ICD-10-CM | POA: Diagnosis not present

## 2021-11-18 DIAGNOSIS — N189 Chronic kidney disease, unspecified: Secondary | ICD-10-CM | POA: Diagnosis not present

## 2021-11-18 DIAGNOSIS — Z8579 Personal history of other malignant neoplasms of lymphoid, hematopoietic and related tissues: Secondary | ICD-10-CM | POA: Diagnosis not present

## 2021-11-18 DIAGNOSIS — I129 Hypertensive chronic kidney disease with stage 1 through stage 4 chronic kidney disease, or unspecified chronic kidney disease: Secondary | ICD-10-CM | POA: Diagnosis not present

## 2021-11-18 DIAGNOSIS — N182 Chronic kidney disease, stage 2 (mild): Secondary | ICD-10-CM | POA: Diagnosis not present

## 2021-11-18 DIAGNOSIS — E1122 Type 2 diabetes mellitus with diabetic chronic kidney disease: Secondary | ICD-10-CM | POA: Diagnosis not present

## 2021-11-18 DIAGNOSIS — N840 Polyp of corpus uteri: Secondary | ICD-10-CM | POA: Diagnosis not present

## 2021-11-18 DIAGNOSIS — C541 Malignant neoplasm of endometrium: Secondary | ICD-10-CM | POA: Diagnosis not present

## 2021-11-18 DIAGNOSIS — D75839 Thrombocytosis, unspecified: Secondary | ICD-10-CM | POA: Diagnosis not present

## 2021-11-18 DIAGNOSIS — R7309 Other abnormal glucose: Secondary | ICD-10-CM

## 2021-11-18 DIAGNOSIS — D508 Other iron deficiency anemias: Secondary | ICD-10-CM

## 2021-11-18 DIAGNOSIS — Z79899 Other long term (current) drug therapy: Secondary | ICD-10-CM | POA: Diagnosis not present

## 2021-11-18 DIAGNOSIS — D631 Anemia in chronic kidney disease: Secondary | ICD-10-CM | POA: Diagnosis not present

## 2021-11-18 DIAGNOSIS — N841 Polyp of cervix uteri: Secondary | ICD-10-CM | POA: Insufficient documentation

## 2021-11-18 HISTORY — PX: DILATATION & CURETTAGE/HYSTEROSCOPY WITH MYOSURE: SHX6511

## 2021-11-18 HISTORY — DX: Disease of blood and blood-forming organs, unspecified: D75.9

## 2021-11-18 HISTORY — DX: Personal history of urinary calculi: Z87.442

## 2021-11-18 LAB — POCT I-STAT, CHEM 8
BUN: 29 mg/dL — ABNORMAL HIGH (ref 8–23)
Calcium, Ion: 1.24 mmol/L (ref 1.15–1.40)
Chloride: 105 mmol/L (ref 98–111)
Creatinine, Ser: 1.5 mg/dL — ABNORMAL HIGH (ref 0.44–1.00)
Glucose, Bld: 124 mg/dL — ABNORMAL HIGH (ref 70–99)
HCT: 28 % — ABNORMAL LOW (ref 36.0–46.0)
Hemoglobin: 9.5 g/dL — ABNORMAL LOW (ref 12.0–15.0)
Potassium: 3.5 mmol/L (ref 3.5–5.1)
Sodium: 143 mmol/L (ref 135–145)
TCO2: 28 mmol/L (ref 22–32)

## 2021-11-18 LAB — CBC
HCT: 27.3 % — ABNORMAL LOW (ref 36.0–46.0)
Hemoglobin: 8.3 g/dL — ABNORMAL LOW (ref 12.0–15.0)
MCH: 25.5 pg — ABNORMAL LOW (ref 26.0–34.0)
MCHC: 30.4 g/dL (ref 30.0–36.0)
MCV: 84 fL (ref 80.0–100.0)
Platelets: 117 10*3/uL — ABNORMAL LOW (ref 150–400)
RBC: 3.25 MIL/uL — ABNORMAL LOW (ref 3.87–5.11)
RDW: 19.3 % — ABNORMAL HIGH (ref 11.5–15.5)
WBC: 6.6 10*3/uL (ref 4.0–10.5)
nRBC: 0 % (ref 0.0–0.2)

## 2021-11-18 LAB — GLUCOSE, CAPILLARY: Glucose-Capillary: 87 mg/dL (ref 70–99)

## 2021-11-18 SURGERY — DILATATION & CURETTAGE/HYSTEROSCOPY WITH MYOSURE
Anesthesia: General | Site: Vagina

## 2021-11-18 MED ORDER — ONDANSETRON HCL 4 MG/2ML IJ SOLN: 4.0000 mg | Freq: Once | INTRAMUSCULAR | Status: DC | PRN

## 2021-11-18 MED ORDER — LACTATED RINGERS IV SOLN: INTRAVENOUS | Status: DC

## 2021-11-18 MED ORDER — ACETAMINOPHEN 10 MG/ML IV SOLN
INTRAVENOUS | Status: DC | PRN
  Administered 2021-11-18: 1000 mg via INTRAVENOUS

## 2021-11-18 MED ORDER — SODIUM CHLORIDE 0.9 % IR SOLN
Status: DC | PRN
  Administered 2021-11-18: 3000 mL

## 2021-11-18 MED ORDER — LIDOCAINE HCL (PF) 2 % IJ SOLN
INTRAMUSCULAR | Status: AC
  Filled 2021-11-18: qty 5

## 2021-11-18 MED ORDER — FENTANYL CITRATE (PF) 100 MCG/2ML IJ SOLN: 25.0000 ug | INTRAMUSCULAR | Status: DC | PRN

## 2021-11-18 MED ORDER — FENTANYL CITRATE (PF) 100 MCG/2ML IJ SOLN
INTRAMUSCULAR | Status: AC
  Filled 2021-11-18: qty 2

## 2021-11-18 MED ORDER — ONDANSETRON HCL 4 MG/2ML IJ SOLN
INTRAMUSCULAR | Status: AC
  Filled 2021-11-18: qty 2

## 2021-11-18 MED ORDER — DEXAMETHASONE SODIUM PHOSPHATE 4 MG/ML IJ SOLN
INTRAMUSCULAR | Status: DC | PRN
  Administered 2021-11-18: 5 mg via INTRAVENOUS

## 2021-11-18 MED ORDER — MIDAZOLAM HCL 2 MG/2ML IJ SOLN
INTRAMUSCULAR | Status: AC
  Filled 2021-11-18: qty 2

## 2021-11-18 MED ORDER — OXYCODONE HCL 5 MG PO TABS: 5.0000 mg | ORAL_TABLET | Freq: Once | ORAL | Status: DC | PRN

## 2021-11-18 MED ORDER — OXYCODONE HCL 5 MG/5ML PO SOLN: 5.0000 mg | Freq: Once | ORAL | Status: DC | PRN

## 2021-11-18 MED ORDER — DEXAMETHASONE SODIUM PHOSPHATE 10 MG/ML IJ SOLN
INTRAMUSCULAR | Status: AC
  Filled 2021-11-18: qty 1

## 2021-11-18 MED ORDER — FENTANYL CITRATE (PF) 100 MCG/2ML IJ SOLN
INTRAMUSCULAR | Status: DC | PRN
  Administered 2021-11-18: 25 ug via INTRAVENOUS
  Administered 2021-11-18: 75 ug via INTRAVENOUS

## 2021-11-18 MED ORDER — PROPOFOL 10 MG/ML IV BOLUS
INTRAVENOUS | Status: AC
  Filled 2021-11-18: qty 20

## 2021-11-18 MED ORDER — ONDANSETRON HCL 4 MG/2ML IJ SOLN
INTRAMUSCULAR | Status: DC | PRN
  Administered 2021-11-18: 4 mg via INTRAVENOUS

## 2021-11-18 MED ORDER — ACETAMINOPHEN 10 MG/ML IV SOLN
INTRAVENOUS | Status: AC
  Filled 2021-11-18: qty 100

## 2021-11-18 MED ORDER — LIDOCAINE 2% (20 MG/ML) 5 ML SYRINGE
INTRAMUSCULAR | Status: DC | PRN
  Administered 2021-11-18: 80 mg via INTRAVENOUS

## 2021-11-18 MED ORDER — PROPOFOL 10 MG/ML IV BOLUS
INTRAVENOUS | Status: DC | PRN
  Administered 2021-11-18: 120 mg via INTRAVENOUS

## 2021-11-18 MED ORDER — MIDAZOLAM HCL 5 MG/5ML IJ SOLN
INTRAMUSCULAR | Status: DC | PRN
  Administered 2021-11-18: 2 mg via INTRAVENOUS

## 2021-11-18 SURGICAL SUPPLY — 14 items
DEVICE MYOSURE REACH (MISCELLANEOUS) IMPLANT
DILATOR CANAL MILEX (MISCELLANEOUS) IMPLANT
DRSG TELFA 3X8 NADH STRL (GAUZE/BANDAGES/DRESSINGS) ×1 IMPLANT
GLOVE BIO SURGEON STRL SZ 6.5 (GLOVE) ×1 IMPLANT
GLOVE BIOGEL PI IND STRL 7.0 (GLOVE) IMPLANT
GOWN STRL REUS W/TWL LRG LVL3 (GOWN DISPOSABLE) ×1 IMPLANT
IV NS IRRIG 3000ML ARTHROMATIC (IV SOLUTION) ×1 IMPLANT
KIT PROCEDURE FLUENT (KITS) ×1 IMPLANT
KIT TURNOVER CYSTO (KITS) ×1 IMPLANT
PACK VAGINAL MINOR WOMEN LF (CUSTOM PROCEDURE TRAY) ×1 IMPLANT
PAD OB MATERNITY 4.3X12.25 (PERSONAL CARE ITEMS) ×1 IMPLANT
PAD PREP 24X48 CUFFED NSTRL (MISCELLANEOUS) ×1 IMPLANT
SEAL ROD LENS SCOPE MYOSURE (ABLATOR) ×1 IMPLANT
TOWEL OR 17X26 10 PK STRL BLUE (TOWEL DISPOSABLE) ×1 IMPLANT

## 2021-11-18 NOTE — Transfer of Care (Signed)
Immediate Anesthesia Transfer of Care Note  Patient: Alexandra [name]  Procedure(s) Performed: Procedure(s) (LRB): DILATATION & CURETTAGE/HYSTEROSCOPY WITH MYOSURE (N/A)  Patient Location: PACU  Anesthesia Type: General  Level of Consciousness: awake, sedated, patient cooperative and responds to stimulation  Airway & Oxygen Therapy: Patient Spontanous Breathing and Patient on RA  Post-op Assessment: Report given to PACU RN, Post -op Vital signs reviewed and stable and Patient moving all extremities  Post vital signs: Reviewed and stable  Complications: No apparent anesthesia complications

## 2021-11-18 NOTE — Discharge Instructions (Addendum)
  Post Anesthesia Home Care Instructions  Activity: Get plenty of rest for the remainder of the day. A responsible individual must stay with you for 24 hours following the procedure.  For the next 24 hours, DO NOT: -Drive a car -Paediatric nurse -Drink alcoholic beverages -Take any medication unless instructed by your physician -Make any legal decisions or sign important papers.  Meals: Start with liquid foods such as gelatin or soup. Progress to regular foods as tolerated. Avoid greasy, spicy, heavy foods. If nausea and/or vomiting occur, drink only clear liquids until the nausea and/or vomiting subsides. Call your physician if vomiting continues.  Special Instructions/Symptoms: Your throat may feel dry or sore from the anesthesia or the breathing tube placed in your throat during surgery. If this causes discomfort, gargle with warm salt water. The discomfort should disappear within 24 hours.  DISCHARGE INSTRUCTIONS: HYSTEROSCOPY / ENDOMETRIAL ABLATION The following instructions have been prepared to help you care for yourself upon your return home.  May take stool softner while taking narcotic pain medication to prevent constipation.  Drink plenty of water.  Personal hygiene:  Use sanitary pads for vaginal drainage, not tampons.  Shower the day after your procedure.  NO tub baths, pools or Jacuzzis for 2-3 weeks.  Wipe front to back after using the bathroom.  Activity and limitations:  Do NOT drive or operate any equipment for 24 hours. The effects of anesthesia are still present and drowsiness may result.  Do NOT rest in bed all day.  Walking is encouraged.  Walk up and down stairs slowly.  You may resume your normal activity in one to two days or as indicated by your physician. Sexual activity: NO intercourse for at least 2 weeks after the procedure, or as indicated by your Doctor.  Diet: Eat a light meal as desired this evening. You may resume your usual diet  tomorrow.  Return to Work: You may resume your work activities in one to two days or as indicated by Marine scientist.  What to expect after your surgery: Expect to have vaginal bleeding/discharge for 2-3 days and spotting for up to 10 days. It is not unusual to have soreness for up to 1-2 weeks. You may have a slight burning sensation when you urinate for the first day. Mild cramps may continue for a couple of days. You may have a regular period in 2-6 weeks.  Call your doctor for any of the following:  Excessive vaginal bleeding or clotting, saturating and changing one pad every hour.  Inability to urinate 6 hours after discharge from hospital.  Pain not relieved by pain medication.  Fever of 100.4 F or greater.  Unusual vaginal discharge or odor.  May take Tylenol for cramps/discomfort beginning at 4 PM.

## 2021-11-18 NOTE — Anesthesia Preprocedure Evaluation (Signed)
Anesthesia Evaluation  Patient identified by MRN, date of birth, ID band Patient awake    Reviewed: Allergy & Precautions, NPO status , Patient's Chart, lab work & pertinent test results, reviewed documented beta blocker date and time   Airway Mallampati: II  TM Distance: >3 FB Neck ROM: Full    Dental no notable dental hx. (+) Dental Advisory Given   Pulmonary neg pulmonary ROS,    Pulmonary exam normal breath sounds clear to auscultation       Cardiovascular hypertension, Pt. on medications and Pt. on home beta blockers Normal cardiovascular exam Rhythm:Regular Rate:Normal     Neuro/Psych negative neurological ROS     GI/Hepatic negative GI ROS, Neg liver ROS,   Endo/Other  diabetes, Well Controlled, Type 2  Renal/GU negative Renal ROS  negative genitourinary   Musculoskeletal negative musculoskeletal ROS (+)   Abdominal   Peds  Hematology  (+) Blood dyscrasia, anemia , Thrombocythemia Myeloproliferative disorder   Anesthesia Other Findings   Reproductive/Obstetrics PMB Endometrial polyp                             Anesthesia Physical Anesthesia Plan  ASA: 2  Anesthesia Plan: General   Post-op Pain Management: Minimal or no pain anticipated   Induction: Intravenous  PONV Risk Score and Plan: 4 or greater and Treatment may vary due to age or medical condition, Ondansetron and Dexamethasone  Airway Management Planned: LMA  Additional Equipment: None  Intra-op Plan:   Post-operative Plan: Extubation in OR  Informed Consent: I have reviewed the patients History and Physical, chart, labs and discussed the procedure including the risks, benefits and alternatives for the proposed anesthesia with the patient or authorized representative who has indicated his/her understanding and acceptance.     Dental advisory given  Plan Discussed with: CRNA and  Anesthesiologist  Anesthesia Plan Comments:         Anesthesia Quick Evaluation

## 2021-11-18 NOTE — Interval H&P Note (Signed)
History and Physical Interval Note:  11/18/2021 9:53 AM  Alexandra [name]  has presented today for surgery, with the diagnosis of postmenopausal bleeding, endometrial polyp.  The various methods of treatment have been discussed with the patient and family. After consideration of risks, benefits and other options for treatment, the patient has consented to  Procedure(s): [place] (N/A) as a surgical intervention.  The patient's history has been reviewed, patient examined, no change in status, stable for surgery.  I have reviewed the patient's chart and labs.  Questions were answered to the patient's satisfaction.     [name]

## 2021-11-18 NOTE — Anesthesia Procedure Notes (Signed)
Procedure Name: LMA Insertion Date/Time: 11/18/2021 10:08 AM  Performed by: Justice Rocher, CRNAPre-anesthesia Checklist: Patient identified, Emergency Drugs available, Suction available, Patient being monitored and Timeout performed Patient Re-evaluated:Patient Re-evaluated prior to induction Oxygen Delivery Method: Circle system utilized Preoxygenation: Pre-oxygenation with 100% oxygen Induction Type: IV induction Ventilation: Mask ventilation without difficulty LMA: LMA inserted LMA Size: 4.0 Number of attempts: 1 Airway Equipment and Method: Bite block Placement Confirmation: positive ETCO2, breath sounds checked- equal and bilateral and CO2 detector Tube secured with: Tape Dental Injury: Teeth and Oropharynx as per pre-operative assessment

## 2021-11-18 NOTE — Op Note (Signed)
Preoperative Diagnosis: Postmenopausal bleeding, thickened endometrium  Postoperative Diagnosis: Postmenopausal bleeding, endometrial polyps  Procedure: Hysteroscopy, polypectomy, dilation and curettage  Surgeon: Dr Sumner Boast  Assistants: None  Anesthesia: general via LMA  EBL: 5cc  Fluids: 500cc  Fluid deficit: 140cc  Urine output: not recorded  Indications for surgery: The patient is a 71 yo female, who presented with postmenopausal bleeding. Work up included an ultrasound with thickened endometrium and an endometrial biopsy with benign endometrial polyp The risks of the surgery were reviewed with the patient and the consent form was signed prior to her surgery.  Findings: EUA: enlarged, anteverted uterus, no adnexal mass, cervical polyp. Hysteroscopy: multiple endometrial polyps, some thickening of the uterine wall, normal tubal ostia.   Specimens: endocervical polyp, endometrial polyps, endometrial curettings   Procedure: The patient was taken to the operating room with an IV in place. She was placed in the dorsal lithotomy position and anesthesia was administered. She was prepped and draped in the usual sterile fashion for a vaginal procedure. She voided on the way to the OR. A weighted speculum was placed in the vagina and a single tooth tenaculum was placed on the anterior lip of the cervix. An endocervical polyp was removed with ringed forceps. The cervix was dilated to a #6 hagar dilator. The uterus was sounded to 11 cm. The myosure hysteroscope was inserted into the uterine cavity. With continuous infusion of normal saline, the uterine cavity was visualized with the above findings. The myosure reach was used to resect the endometrial polyps and remove some of the thickened endometrium. The myosure was then removed. The cavity was then curetted with the small sharp curette. The cavity had the characteristically gritty texture at the end of the procedure. The curette and the  single tooth tenaculum were removed. Oozing from the tenaculum site was stopped with pressure. The speculum was removed. The patients perineum was cleansed of betadine and she was taken out of the dorsal lithotomy position.  Upon awakening the LMA was removed and the patient was transferred to the recovery room in stable and awake condition.  The sponge and instrument count were correct. There were no complications.

## 2021-11-18 NOTE — Anesthesia Postprocedure Evaluation (Signed)
Anesthesia Post Note  Patient: Alexandra [name]  Procedure(s) Performed: DILATATION & CURETTAGE/HYSTEROSCOPY WITH MYOSURE (Vagina )     Patient location during evaluation: PACU Anesthesia Type: General Level of consciousness: awake and alert and oriented Pain management: pain level controlled Vital Signs Assessment: post-procedure vital signs reviewed and stable Respiratory status: spontaneous breathing, nonlabored ventilation and respiratory function stable Cardiovascular status: blood pressure returned to baseline and stable Postop Assessment: no apparent nausea or vomiting Anesthetic complications: no   No notable events documented.  Last Vitals:  Vitals:   11/18/21 1100 11/18/21 1115  BP: (!) 144/75 (!) 142/78  Pulse: 78 73  Resp: 12 20  Temp:    SpO2: 99% 100%    Last Pain:  Vitals:   11/18/21 1115  TempSrc:   PainSc: 0-No pain                 Edmund Rick A.

## 2021-11-19 ENCOUNTER — Encounter (HOSPITAL_BASED_OUTPATIENT_CLINIC_OR_DEPARTMENT_OTHER): Payer: Self-pay | Admitting: Obstetrics and Gynecology

## 2021-11-21 ENCOUNTER — Ambulatory Visit
Admission: RE | Admit: 2021-11-21 | Discharge: 2021-11-21 | Disposition: A | Discharge status: Home / Self Care | Payer: Medicare PPO | Source: Ambulatory Visit | Attending: Internal Medicine | Admitting: Internal Medicine

## 2021-11-21 DIAGNOSIS — R928 Other abnormal and inconclusive findings on diagnostic imaging of breast: Secondary | ICD-10-CM | POA: Diagnosis not present

## 2021-11-21 DIAGNOSIS — N6489 Other specified disorders of breast: Secondary | ICD-10-CM | POA: Diagnosis not present

## 2021-11-24 LAB — SURGICAL PATHOLOGY

## 2021-11-25 ENCOUNTER — Telehealth: Payer: Self-pay | Admitting: *Deleted

## 2021-11-25 NOTE — Telephone Encounter (Signed)
Spoke with patient.  Called patient to move post-op to earlier appt today at 3:15pm, patient declined due to work schedule. Patient asked why, advised for provider to discuss abnormal surgical pathology results. Patient again declined due to work schedule. Will keep OV as scheduled for 9/27 at 1130. Advised patient if she changes her mind, she can return call to me at [phone], OPT 5.   Patient verbalizes understanding.   Routing to Dr. Rosann Auerbach.   Encounter closed.

## 2021-11-26 ENCOUNTER — Encounter: Payer: Self-pay | Admitting: Obstetrics and Gynecology

## 2021-11-26 ENCOUNTER — Ambulatory Visit (INDEPENDENT_AMBULATORY_CARE_PROVIDER_SITE_OTHER): Payer: Medicare PPO | Admitting: Obstetrics and Gynecology

## 2021-11-26 ENCOUNTER — Telehealth: Payer: Self-pay | Admitting: *Deleted

## 2021-11-26 VITALS — BP 138/78 | HR 65 | Wt 129.0 lb

## 2021-11-26 DIAGNOSIS — C541 Malignant neoplasm of endometrium: Secondary | ICD-10-CM

## 2021-11-26 NOTE — Telephone Encounter (Signed)
-----   Message from [name], MD sent at 11/25/2021  9:41 AM EDT ----- Please set her up to see GYN Onc. Pathology from last week with endometrial cancer.

## 2021-11-26 NOTE — Telephone Encounter (Signed)
Referral placed at [facility] oncology they will call to schedule.

## 2021-11-26 NOTE — Patient Instructions (Signed)
Uterine Cancer  Uterine cancer is an abnormal growth of cancer tissue in the womb (uterus). Uterine cancer can spread to other parts of the body. Uterine cancer usually occurs after a woman stops having menstrual periods (menopause). However, it may also occur around the time that menopause starts. The wall of the uterus has an inner layer of tissue called the endometrium and an outer layer of muscle tissue called the myometrium. The most common type of uterine cancer is endometrial cancer, and it starts in the endometrium. Cancer that starts in the myometrium (uterine sarcoma) is very rare. What are the causes? The exact cause of this condition is not known. What increases the risk? You are more likely to develop this condition if you: Are older than 50. Have a thicker than normal endometrium (endometrial hyperplasia). Have never been pregnant or cannot have children. Started menstruating when you were younger than 71 years old or are older than 52 and having menstrual periods. Have a history of cancer, including: Cancer of the ovaries, or cancer of the intestines, colon, or rectum (colorectal cancer). A family history of uterine cancer or hereditary nonpolyposis colon cancer. Have a long-term (chronic) condition, such as: Diabetes. High blood pressure. Thyroid disease. Gallbladder disease. Obesity. A personal history of enlarged ovaries with small cysts (polycystic ovarian syndrome). Have been exposed to: Radiation. Cigarette smoke. Hormone therapy or a medicine called tamoxifen. What are the signs or symptoms? Symptoms of this condition include: Abnormal vaginal bleeding or discharge. Bleeding may start as a watery, blood-streaked flow that gradually contains more blood. This is the most common symptom. If you experience abnormal vaginal bleeding, do not assume that it is part of menopause. Vaginal bleeding after menopause. Unexplained weight loss. Bleeding between  periods. Urination that is difficult, painful, or more frequent than usual. A lump in the vagina. Pain, bloating, fullness in the abdomen, or pelvic pain. You may also have pain during sex. How is this diagnosed? This condition may be diagnosed based on your medical history, symptoms, a physical exam, and a pelvic exam. During the pelvic exam, your health care provider will feel your pelvis for any growths, as well as for any enlarged lymph nodes. You may also have tests, including: Blood and urine tests. Imaging tests, such as X-rays, CT scans, ultrasound, or MRIs. Hysteroscopy. This involves inserting a thin, flexible tube with a light and camera on the end through the vagina to look inside the uterus. A Pap test to check for abnormal cells in the lower part of the uterus (cervix) and the upper vagina. Removal of a tissue sample (biopsy) from the uterine lining. The sample will be examined in a lab to check for cancer cells. Dilation and curettage (D&C). This procedure involves stretching the cervix and scraping the lining of the uterus to get a tissue sample to check for cancer cells. The cancer is staged to determine its severity and extent. Staging is an assessment of the size of the tumor and if, or where, the cancer has spread. The stages of uterine cancer are: Stage I. The cancer is only in the uterus. Stage II. The cancer has spread to the cervix. Stage III. The cancer has spread outside the uterus, but not outside the pelvis. The cancer may have spread to the lymph nodes in the pelvis. Lymph nodes are part of your body's disease-fighting (immune) system. Stage IV. The cancer has spread to other parts of the body, such as the bladder, rectum, and other organs. How is  this treated? This condition is often treated with surgery to remove: The uterus, cervix, fallopian tubes, and ovaries (total hysterectomy). The uterus and cervix (simple hysterectomy). The type of hysterectomy you will  have depends on the extent of your cancer. Lymph nodes near the uterus may also be removed. Treatment may include one or more of the following: Chemotherapy. These are medicines to kill cancer cells or to slow their growth. Chemotherapy also kills other normal, healthy cells, including blood-forming cells in the bone marrow, hair follicles, and cells in the mouth, digestive tract, and reproductive system. Radiation therapy. This uses high-energy rays to kill cancer cells and prevent their spread. Chemoradiation. This treatment alternates chemotherapy with radiation treatments to enhance the effects of radiation. Brachytherapy. This involves placing small radioactive capsules inside the body where the cancer was removed. Hormone therapy. This includes taking medicines that lower the levels of estrogen in the body. Immunotherapy. These medicines help your immune system fight cancer. Targeted therapy. These medicines specifically kill cancer cells. Follow these instructions at home: Medicines Take over-the-counter and prescription medicines only as told by your health care provider. Ask your health care provider if the medicine prescribed to you requires you to avoid driving or using machinery. Activity Return to your normal activities as told by your health care provider. Ask your health care provider what activities are safe for you. Get regular exercise. Aim for 30 minutes of moderate-intensity activity 5 times a week. Examples of moderate-intensity activity include walking and yoga. Be sure to talk with your health care provider before starting any exercise routine. Lifestyle Eat a healthy diet. A healthy diet includes a lot of fruits and vegetables, low-fat dairy products, lean meats, and fiber. Do not use any products that contain nicotine or tobacco. These products include cigarettes, chewing tobacco, and vaping devices, such as e-cigarettes. If you need help quitting, ask your health care  provider. Consider joining a support group to help you cope with stress. Your health care provider may be able to recommend a local or online support group. General instructions Drink enough fluid to keep your urine pale yellow. Do not have sex, douche, or place anything in your vagina, such as a tampon or diaphragm, until your health care provider says that it is safe. Work with your health care provider to: Manage any chronic conditions you have. Manage any side effects of your treatment. Keep all follow-up visits. This is important. Where to find more information American Cancer Society: www.cancer.[place]: www.cancer.gov Contact a health care provider if: You have pain in your pelvis or abdomen that gets worse. You cannot urinate. You have abnormal vaginal bleeding. You have a fever. Get help right away if: You develop sudden or new severe symptoms, such as: Heavy bleeding. Severe weakness. Pain that is severe or does not get better with medicine. Summary Uterine cancer is an abnormal growth of cancer tissue in the uterus. The most common type of uterine cancer starts in the endometrium and is called endometrial cancer. This condition is often treated with surgery to remove the uterus, cervix, fallopian tubes, and ovaries (total hysterectomy) or the uterus and cervix (simple hysterectomy). Work with your health care provider to manage any long-term (chronic) conditions you have, such as diabetes, high blood pressure, thyroid disease, or gallbladder disease. Consider joining a support group to help you cope with stress. Your health care provider may be able to recommend a local or online support group. This information is not intended to replace  advice given to you by your health care provider. Make sure you discuss any questions you have with your health care provider. Document Revised: 10/03/2019 Document Reviewed: 10/03/2019 Elsevier Patient Education  [place].

## 2021-11-26 NOTE — Progress Notes (Signed)
GYNECOLOGY  VISIT   HPI: 71 y.o.   Married Black or [place] American Not Hispanic or Latino  female   [phone] with No LMP recorded. Patient is postmenopausal.   here for  1 week post op s/p hysteroscopy, D&C. She states that she is feel good. She had very little bleeding and no cramping after.   FINAL MICROSCOPIC DIAGNOSIS:   A. ENDOCERVIX, POLYPECTOMY:  - Benign cervical polyp with metaplastic changes  - Negative for dysplasia or malignancy   B. ENDOMETRIUM, POLYPECTOMY:  - High-grade serous carcinoma, see comment   C. ENDOMETRIUM, CURETTAGE:  - High-grade serous carcinoma, see comment   GYNECOLOGIC HISTORY: No LMP recorded. Patient is postmenopausal. 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   Goals of care, counseling/discussion 12/15/2019   Iron deficiency anemia 03/15/2013   Diabetes mellitus type 2, controlled, without complications ([place]) 16/38/4665   Routine gynecological examination 08/24/2012   ABNORMAL ELECTROCARDIOGRAM 08/30/2008   THROMBOCYTHEMIA 07/31/2008   Vitamin D deficiency 07/31/2008   HYPOKALEMIA, MILD 07/31/2008   HYPERGLYCEMIA 07/31/2008   Essential hypertension 10/18/2006    Past Medical History:  Diagnosis Date   Blood dyscrasia    Diabetes mellitus without complication ([place])    Goals of care, counseling/discussion 12/15/2019   History of kidney stones    5-6 yrs ago. 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  10/30/2021   DILATATION & CURETTAGE/HYSTEROSCOPY WITH MYOSURE N/A 11/18/2021   Procedure: DILATATION & CURETTAGE/HYSTEROSCOPY WITH MYOSURE;  Surgeon: [name], MD;  Location: [facility];  Service: Gynecology;  Laterality: N/A;   ECTOPIC PREGNANCY SURGERY  03/02/1982    Current Outpatient Medications  Medication Sig Dispense Refill   amLODipine (NORVASC) 10 MG tablet TAKE 1 TABLET BY MOUTH EVERY DAY (Patient taking differently: Take 10 mg by mouth daily. TAKE 1 TABLET BY MOUTH EVERY DAY) 30 tablet 0   anagrelide (AGRYLIN) 1 MG capsule TAKE 5 CAPSULES BY MOUTH DAILY (Patient taking differently: Take 1 mg by mouth daily. Pt takes 5 capsules every day) 450 capsule 3   aspirin 81 MG tablet Take 81 mg by mouth daily.     Fe Fum-FePoly-Vit C-Vit B3 (INTEGRA) 62.5-62.5-40-3 MG CAPS TAKE 1 CAPSULE BY MOUTH DAILY (Patient taking differently: Take 1 capsule by mouth daily.) 30 capsule 8   folic acid (FOLVITE) 1 MG tablet Take 2 tablets by mouth once daily 60 tablet 6   glucose blood (ONE TOUCH ULTRA TEST) test strip Use to test blood sugar 1 times daily. **PT NEEDS FOLLOW UP APPT FOR FURTHER REFILLS** 100 each 0   lisinopril (ZESTRIL) 20 MG  tablet TAKE 2 TABLETS BY MOUTH EVERY DAY (Patient taking differently: Take 20 mg by mouth daily. TAKE 2 TABLETS BY MOUTH EVERY DAY) 180 tablet 3   Nebivolol HCl 20 MG TABS TAKE 1 TABLET(20 MG) BY MOUTH DAILY (Patient taking differently: Take 20 mg by mouth daily.) 90 tablet 0   ONETOUCH DELICA LANCETS 07M MISC Use to test blood sugar 1 time daily. **PT NEEDS FOLLOW UP APPT FOR FURTHER REFILLS** 100 each 1   Vitamin D, Ergocalciferol, (DRISDOL) 1.25 MG (50000 UNIT) CAPS capsule TAKE 1 CAPSULE BY MOUTH 1 TIME A WEEK (Patient taking differently: Take 50,000 Units by mouth every 7 (seven) days. TAKE 1 CAPSULE BY MOUTH 1 TIME A WEEK) 12 capsule 2   No current facility-administered medications for this visit.      ALLERGIES: Patient has no known allergies.  Family History  Problem Relation Age of Onset   Heart disease Mother        died age 57   Kidney disease Mother        family hx   Heart attack Father 10       massive   Hypertension Sister    Hypertension Sister    Hypertension Sister    Hypertension Sister    Arthritis Other        family hx   Diabetes Other        family hx   Stroke Other         dialysis    Social History   Socioeconomic History   Marital status: Married    Spouse name: Not on file   Number of children: Not on file   Years of education: Not on file   Highest education level: Not on file  Occupational History   Occupation: retired  Tobacco Use   Smoking status: Never   Smokeless tobacco: Never   Tobacco comments:    never used tobacco  Vaping Use   Vaping Use: Never used  Substance and Sexual Activity   Alcohol use: No    Alcohol/week: 0.0 standard drinks of alcohol   Drug use: No   Sexual activity: Yes    Partners: Male    Birth control/protection: Post-menopausal  Other Topics Concern   Not on file  Social History Narrative   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   No tobacco no ethoh little caffiene .    Social Determinants of Health   Financial Resource Strain: Low Risk  (12/25/2020)   Overall Financial Resource Strain (CARDIA)    Difficulty of Paying Living Expenses: Not hard at all  Food Insecurity: No Food Insecurity (12/25/2020)   Hunger Vital Sign    Worried About Running Out of Food in the Last Year: Never true    Ran Out of Food in the Last Year: Never true  Transportation Needs: No Transportation Needs (12/25/2020)   PRAPARE - Hydrologist (Medical): No    Lack of Transportation (Non-Medical): No  Physical Activity: Insufficiently Active (12/25/2020)   Exercise Vital Sign    Days of Exercise per Week: 3 days    Minutes of Exercise per Session: 30 min   Stress: No Stress Concern Present (12/25/2020)   [place]    Feeling of Stress : Not at all  Social Connections: Moderately Integrated (12/25/2020)   Social  Connection and Isolation Panel [NHANES]    Frequency of Communication with Friends and Family: Three times a week    Frequency of Social Gatherings with Friends and Family: Once a week    Attends Religious Services: More than 4 times per year    Active Member of [employer] or Organizations: No    Attends Archivist Meetings: Never    Marital Status: Married  Human resources officer Violence: Not At Risk (12/25/2020)   Humiliation, Afraid, Rape, and Kick questionnaire    Fear of Current or Ex-Partner: No    Emotionally Abused: No    Physically Abused: No    Sexually Abused: No    ROS  PHYSICAL EXAMINATION:    There were no vitals taken for this visit.    General appearance: alert, cooperative and appears stated age  15. [facility]) Reviewed pathology with the patient Referral has been placed to GYN Oncology

## 2021-11-27 ENCOUNTER — Telehealth: Payer: Self-pay

## 2021-11-27 DIAGNOSIS — H40013 Open angle with borderline findings, low risk, bilateral: Secondary | ICD-10-CM | POA: Diagnosis not present

## 2021-11-27 DIAGNOSIS — H25813 Combined forms of age-related cataract, bilateral: Secondary | ICD-10-CM | POA: Diagnosis not present

## 2021-11-27 DIAGNOSIS — H33322 Round hole, left eye: Secondary | ICD-10-CM | POA: Diagnosis not present

## 2021-11-27 DIAGNOSIS — E113293 Type 2 diabetes mellitus with mild nonproliferative diabetic retinopathy without macular edema, bilateral: Secondary | ICD-10-CM | POA: Diagnosis not present

## 2021-11-27 LAB — HM DIABETES EYE EXAM

## 2021-11-27 NOTE — Telephone Encounter (Signed)
Spoke with the patient regarding the referral to GYN oncology. Patient scheduled as new patient with Dr Berline Lopes on 12/02/2021. Patient given an arrival time of 9:15am.  Explained to the patient the the doctor will perform a pelvic exam at this visit. Patient given the policy that no visitors under the 16 yrs are allowed in the [facility]. Patient given the address/phone number for the clinic and that the center offers free valet service.

## 2021-11-27 NOTE — Telephone Encounter (Signed)
Patient scheduled on 12/02/21 with Dr.Tucker.

## 2021-11-28 ENCOUNTER — Telehealth: Payer: Self-pay

## 2021-11-28 ENCOUNTER — Encounter: Payer: Self-pay | Admitting: Gynecologic Oncology

## 2021-11-28 ENCOUNTER — Other Ambulatory Visit: Payer: Self-pay | Admitting: Gynecologic Oncology

## 2021-11-28 ENCOUNTER — Encounter: Payer: Self-pay | Admitting: Internal Medicine

## 2021-11-28 DIAGNOSIS — R7989 Other specified abnormal findings of blood chemistry: Secondary | ICD-10-CM

## 2021-11-28 NOTE — Progress Notes (Signed)
Patient to see Dr. Berline Lopes for newly diagnosed high grade serous endometrial cancer. Plan for CT CAP to evaluate for metastatic disease.

## 2021-11-28 NOTE — Telephone Encounter (Signed)
Told Alexandra [name] that Dr. Berline Lopes would like to set up a CT Scan of her Chest, Abdomen, and Pelvis as a part of her cancer staging. This is scheduled for Friday 12-05-21 at [facility] at 4:30 pm with a 4:pm arrival. She cannot eat or drink 4 hours prior to the scan = 1230 pm. She will have her kidney function lab work done after Dr. Charisse March appointment on 12-02-21 and will pick up her contrast and directions. Sent CT scan to be prior auth via [facility]. Pt verbalized understanding.

## 2021-12-02 ENCOUNTER — Inpatient Hospital Stay: Payer: Medicare PPO

## 2021-12-02 ENCOUNTER — Inpatient Hospital Stay: Payer: Medicare PPO | Attending: Hematology & Oncology | Admitting: Gynecologic Oncology

## 2021-12-02 ENCOUNTER — Other Ambulatory Visit: Payer: Self-pay | Admitting: Hematology & Oncology

## 2021-12-02 ENCOUNTER — Encounter: Payer: Self-pay | Admitting: Gynecologic Oncology

## 2021-12-02 ENCOUNTER — Other Ambulatory Visit: Payer: Self-pay

## 2021-12-02 VITALS — BP 139/87 | HR 86 | Temp 97.6°F | Resp 18 | Ht 70.08 in | Wt 125.8 lb

## 2021-12-02 DIAGNOSIS — C549 Malignant neoplasm of corpus uteri, unspecified: Secondary | ICD-10-CM | POA: Diagnosis not present

## 2021-12-02 DIAGNOSIS — Z79899 Other long term (current) drug therapy: Secondary | ICD-10-CM | POA: Insufficient documentation

## 2021-12-02 DIAGNOSIS — Z87442 Personal history of urinary calculi: Secondary | ICD-10-CM | POA: Insufficient documentation

## 2021-12-02 DIAGNOSIS — C55 Malignant neoplasm of uterus, part unspecified: Secondary | ICD-10-CM | POA: Diagnosis not present

## 2021-12-02 DIAGNOSIS — D473 Essential (hemorrhagic) thrombocythemia: Secondary | ICD-10-CM

## 2021-12-02 DIAGNOSIS — D638 Anemia in other chronic diseases classified elsewhere: Secondary | ICD-10-CM | POA: Insufficient documentation

## 2021-12-02 DIAGNOSIS — D471 Chronic myeloproliferative disease: Secondary | ICD-10-CM

## 2021-12-02 DIAGNOSIS — E119 Type 2 diabetes mellitus without complications: Secondary | ICD-10-CM | POA: Diagnosis not present

## 2021-12-02 DIAGNOSIS — Z7982 Long term (current) use of aspirin: Secondary | ICD-10-CM | POA: Diagnosis not present

## 2021-12-02 DIAGNOSIS — D631 Anemia in chronic kidney disease: Secondary | ICD-10-CM

## 2021-12-02 NOTE — Progress Notes (Signed)
GYNECOLOGIC ONCOLOGY NEW PATIENT CONSULTATION   Patient Name: Alexandra [name]  Patient Age: 71 y.o. 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  We reviewed the nature of endometrial cancer and its recommended surgical staging, including total hysterectomy, bilateral salpingo-oophorectomy, and lymph node assessment. The patient is a suitable candidate for staging via a minimally invasive approach to surgery.  We reviewed that robotic assistance would be used to complete the surgery.   We discussed that most endometrial cancer is detected early, however, we reviewed that adjuvant therapy will likely be recommended based on the patient's biopsy, however, we will defer to final pathology results.  The patient expressed some reticence today about the idea of chemotherapy.  I suggested that we defer further discussion and decisions about systemic treatment as she seems quite overwhelmed with the diagnosis and our discussion about surgery today.  Given her high risk histology, I recommend CT scan preoperatively to rule out metastatic disease. This is scheduled for 10/6.  There was more free fluid than I would have expected on her ultrasound, but locally the patient is completely asymptomatic.  We discussed the possibility that her CT scan shows metastatic disease.  In this case, I may recommend neoadjuvant therapy prior to surgery.  We reviewed the sentinel lymph node technique. Risks and benefits of sentinel lymph node biopsy was reviewed. We reviewed the technique and ICG dye. The patient DOES NOT have an iodine allergy or known liver dysfunction. We reviewed the false negative rate (0.4%), and that 3% of patients with metastatic disease will not have it detected by SLN biopsy in endometrial cancer. A low risk of  allergic reaction to the dye, <0.2% for ICG, has been reported. We also discussed that in the case of failed mapping, which occurs 40% of the time, a bilateral or unilateral lymphadenectomy will be performed at the surgeon's discretion.   Potential benefits of sentinel nodes including a higher detection rate for metastasis due to ultrastaging and potential reduction in operative morbidity. However, there remains uncertainty as to the role for treatment of micrometastatic disease. Further, the benefit of operative morbidity associated with the SLN technique in endometrial cancer is not yet completely known. In other patient populations (e.g. the cervical cancer population) there has been observed reductions in morbidity with SLN biopsy compared to pelvic lymphadenectomy. Lymphedema, nerve dysfunction and lymphocysts are all potential risks with the SLN technique as with complete lymphadenectomy. Additional risks to the patient include the risk of damage to an internal organ while operating in an altered view (e.g. the black and white image of the robotic fluorescence imaging mode).   The patient was consented for a robotic assisted hysterectomy, bilateral salpingo-oophorectomy, sentinel lymph node evaluation, possible lymph node dissection, omentectomy, possible laparotomy. The risks of surgery were discussed in detail and she understands these to include infection; wound separation; hernia; vaginal cuff separation, injury to adjacent organs such as bowel, bladder, blood vessels, ureters and nerves; bleeding which may require blood transfusion; anesthesia risk; thromboembolic events; possible death; unforeseen complications; possible need for re-exploration; medical complications such as heart attack, stroke, pleural effusion and pneumonia; and, if full lymphadenectomy is performed the risk of lymphedema and lymphocyst. The patient will receive DVT and antibiotic prophylaxis as indicated. She voiced a clear  understanding. She had the opportunity to ask questions.   We will plan  to have her return for perioperative visit.  I will have the office reach out to her medical oncologist in the setting of her thrombocythemia.  She will need to come off her blood thinner prior to surgery and likely could restart it within a couple of days after surgery.  We will plan to give her phylactic heparin before surgery.  Plan to draw CA-125 her to surgery.  We will reach out to her medical oncology team to see if we can get this done at her visit tomorrow.  She will also have a repeat CMP with a creatinine prior to her CT scan.  A copy of this note was sent to the patient's referring provider.   70 minutes of total time was spent for this patient encounter, including preparation, face-to-face counseling with the patient and coordination of care, and documentation of the encounter.  Jeral Pinch, MD  Division of Gynecologic Oncology  Department of Obstetrics and Gynecology  [facility]  ___________________________________________  Chief Complaint: Chief Complaint  Patient presents with   Serous carcinoma of body of uterus [facility])    History of Present Illness:  Alexandra [name] is a 71 y.o. y.o. female who is seen in consultation at the request of Dr. Talbert Nan for an evaluation of high-grade endometrial cancer.  Patient was initially seen in July after having postmenopausal bleeding from 6/6 - 6/16.  She described having light intermittent vaginal spotting.  Was wearing a pad, but mostly just saw the spotting on tissue when she wiped after voiding.  She denied any cramping or pain.  She underwent cervical polypectomy at that time with benign cervical polyp noted, no dysplasia or malignancy.  Subsequently, she continued to have intermittent light vaginal spotting.  She thinks she may have had an episode of light spotting about a year ago.  Pelvic ultrasound exam performed on 8/3  shows a uterus measuring 11.1 x 6.5 x 4.6 cm with multiple fibroids measuring up to 2.2 cm.  Endocervical lesion thought to represent a polyp measures 7 mm.  Endometrium is asymmetrically thickened measuring up to 22 mm, noted to be cystic and vascular concerning for possible polyps.  Moderate free fluid noted.  Neither ovary well appreciated.  Subsequently, on 8/11, the patient underwent endometrial biopsy showing benign endometrial polyp, scant benign endocervix.  On 11/18/2021, the patient was taken for an endocervical polypectomy, endometrial polypectomy, and D&C.  Findings at the time of surgery included an enlarged anteverted uterus, no adnexal masses.  Cervical polyp noted.  On hysteroscopy, multiple endometrial polyps with some thickening of the uterine wall noted.  Final pathology revealed benign cervical polyp with metaplastic changes.  Endometrial polyp revealed high-grade serous carcinoma as did the endometrial curettage specimen.  She endorses good appetite without nausea or emesis.  She denies any recent weight changes.  She reports good energy levels.  She reports normal bowel and bladder function.  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  Follows with Dr. Marin Olp for this.  She is on agrylin.  Her last Hgb A1c was 5.7% in July.  PAST MEDICAL HISTORY:  Past Medical History:  Diagnosis Date   Blood dyscrasia    Diabetes mellitus without complication ([place])    Goals of care, counseling/discussion 12/15/2019   History of kidney stones    5-6 yrs ago. 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  10/30/2021   DILATATION & CURETTAGE/HYSTEROSCOPY WITH MYOSURE N/A 11/18/2021   Procedure: DILATATION &  CURETTAGE/HYSTEROSCOPY WITH MYOSURE;  Surgeon: [name], MD;  Location: [place];  Service: Gynecology;  Laterality: N/A;   ECTOPIC PREGNANCY SURGERY  03/02/1982   salpingostomy    OB/GYN HISTORY:  OB History  Gravida Para Term Preterm AB Living  _0 SAB IAB Ectopic Multiple Live Births    1 1        # Outcome Date GA Lbr Len/2nd Weight Sex Delivery Anes PTL Lv  4 IAB           3 Term           2 Term           1 Ectopic             No LMP recorded. Patient is postmenopausal.  Age at menarche: 89  Age at menopause: 88 Hx of HRT: denies Hx of STDs: denies Last pap: 08/2021 - [facility] History of abnormal pap smears: no  SCREENING STUDIES:  Last mammogram: 10/2021  Last colonoscopy: 05/2014 Last bone mineral density: 2018  MEDICATIONS: Outpatient Encounter Medications as of 12/02/2021  Medication Sig   amLODipine (NORVASC) 10 MG tablet TAKE 1 TABLET BY MOUTH EVERY DAY (Patient taking differently: Take 10 mg by mouth daily. TAKE 1 TABLET BY MOUTH EVERY DAY)   anagrelide (AGRYLIN) 1 MG capsule TAKE 5 CAPSULES BY MOUTH DAILY (Patient taking differently: Take 1 mg by mouth daily. Pt takes 5 capsules every day)   aspirin 81 MG tablet Take 81 mg by mouth daily.   Fe Fum-FePoly-Vit C-Vit B3 (INTEGRA) 62.5-62.5-40-3 MG CAPS TAKE 1 CAPSULE BY MOUTH DAILY (Patient taking differently: Take 1 capsule by mouth daily.)   folic acid (FOLVITE) 1 MG tablet Take 2 tablets by mouth once daily   lisinopril (ZESTRIL) 20 MG tablet TAKE 2 TABLETS BY MOUTH EVERY DAY (Patient taking differently: Take 20 mg by mouth daily. TAKE 2 TABLETS BY MOUTH EVERY DAY)   Nebivolol HCl 20 MG TABS TAKE 1 TABLET(20 MG) BY MOUTH DAILY (Patient taking differently: Take 20 mg by mouth daily.)   Vitamin D, Ergocalciferol, (DRISDOL) 1.25 MG (50000 UNIT) CAPS capsule TAKE 1 CAPSULE BY MOUTH 1 TIME A WEEK (Patient taking differently: Take 50,000 Units by mouth every 7 (seven) days. TAKE 1  CAPSULE BY MOUTH 1 TIME A WEEK)   glucose blood (ONE TOUCH ULTRA TEST) test strip Use to test blood sugar 1 times daily. **PT NEEDS FOLLOW UP APPT FOR FURTHER REFILLS** (Patient not taking: Reported on 2/63/7858)   ONETOUCH DELICA LANCETS 85O MISC Use to test blood sugar 1 time daily. **PT NEEDS FOLLOW UP APPT FOR FURTHER REFILLS** (Patient not taking: Reported on 11/28/2021)   No facility-administered encounter medications on file as of 12/02/2021.    ALLERGIES:  No Known Allergies   FAMILY HISTORY:  Family History  Problem Relation Age of Onset   Heart disease Mother        died age 60   Kidney disease Mother        family hx   Heart attack Father 37       massive   Hypertension Sister    Hypertension Sister    Hypertension Sister  Hypertension Sister    Arthritis Other        family hx   Diabetes Other        family hx   Stroke Other         dialysis   Colon cancer Neg Hx    Breast cancer Neg Hx    Ovarian cancer Neg Hx    Endometrial cancer Neg Hx    Pancreatic cancer Neg Hx    Prostate cancer Neg Hx      SOCIAL HISTORY:  Social Connections: Moderately Integrated (12/25/2020)   Social Connection and Isolation Panel [NHANES]    Frequency of Communication with Friends and Family: Three times a week    Frequency of Social Gatherings with Friends and Family: Once a week    Attends Religious Services: More than 4 times per year    Active Member of [employer] or Organizations: No    Attends Music therapist: Never    Marital Status: Married    REVIEW OF SYSTEMS:  + vaginal bleeding Denies appetite changes, fevers, chills, fatigue, unexplained weight changes. Denies hearing loss, neck lumps or masses, mouth sores, ringing in ears or voice changes. Denies cough or wheezing.  Denies shortness of breath. Denies chest pain or palpitations. Denies leg swelling. Denies abdominal distention, pain, blood in stools, constipation, diarrhea, nausea, vomiting, or early  satiety. Denies pain with intercourse, dysuria, frequency, hematuria or incontinence. Denies hot flashes, pelvic pain or vaginal discharge.   Denies joint pain, back pain or muscle pain/cramps. Denies itching, rash, or wounds. Denies dizziness, headaches, numbness or seizures. Denies swollen lymph nodes or glands, denies easy bruising or bleeding. Denies anxiety, depression, confusion, or decreased concentration.  Physical Exam:  Vital Signs for this encounter:  Blood pressure 139/87, pulse 86, temperature 97.6 F (36.4 C), temperature source Oral, resp. rate 18, height 5' 10.08" (1.78 m), weight 125 lb 12.8 oz (57.1 kg), SpO2 100 %. Body mass index is 18.01 kg/m. General: Alert, oriented, no acute distress.  HEENT: Normocephalic, atraumatic. Sclera anicteric.  Chest: Clear to auscultation bilaterally. No wheezes, rhonchi, or rales. Cardiovascular: Regular rate and rhythm, no murmurs, rubs, or gallops.  Abdomen: Normoactive bowel sounds. Soft, nondistended, nontender to palpation. No masses or hepatosplenomegaly appreciated. No palpable fluid wave. Well healed Pfannenstiel incision. Extremities: Grossly normal range of motion. Warm, well perfused. No edema bilaterally.  Skin: No rashes or lesions.  Lymphatics: No cervical, supraclavicular, or inguinal adenopathy.  GU:  Normal external female genitalia. No lesions. No discharge or bleeding.             Bladder/urethra:  No lesions or masses, well supported bladder             Vagina: Mildly atrophic, no lesions.             Cervix: Normal appearing, no lesions.             Uterus: Small, mobile, no parametrial involvement or nodularity.             Adnexa: No masses.  Rectal: Deferred.  LABORATORY AND RADIOLOGIC DATA:  Outside medical records were reviewed to synthesize the above history, along with the history and physical obtained during the visit.   Lab Results  Component Value Date   WBC 6.6 11/18/2021   HGB 9.5 (L) 11/18/2021    HCT 28.0 (L) 11/18/2021   PLT 117 (L) 11/18/2021   GLUCOSE 124 (H) 11/18/2021   CHOL 253 (H) 04/22/2020   TRIG 56  04/22/2020   HDL 64 04/22/2020   LDLDIRECT 127.3 04/19/2012   LDLCALC 178 (H) 04/22/2020   ALT 11 10/24/2021   AST 13 (L) 10/24/2021   NA 143 11/18/2021   K 3.5 11/18/2021   CL 105 11/18/2021   CREATININE 1.50 (H) 11/18/2021   BUN 29 (H) 11/18/2021   CO2 29 10/24/2021   TSH 1.62 05/14/2016   INR 1.1 05/13/2018   HGBA1C 5.7 (H) 09/05/2021   MICROALBUR 6.1 (H) 08/25/2021

## 2021-12-02 NOTE — H&P (View-Only) (Signed)
GYNECOLOGIC ONCOLOGY NEW PATIENT CONSULTATION   Patient Name: Alexandra [name]  Patient Age: 71 y.o. 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  We reviewed the nature of endometrial cancer and its recommended surgical staging, including total hysterectomy, bilateral salpingo-oophorectomy, and lymph node assessment. The patient is a suitable candidate for staging via a minimally invasive approach to surgery.  We reviewed that robotic assistance would be used to complete the surgery.   We discussed that most endometrial cancer is detected early, however, we reviewed that adjuvant therapy will likely be recommended based on the patient's biopsy, however, we will defer to final pathology results.  The patient expressed some reticence today about the idea of chemotherapy.  I suggested that we defer further discussion and decisions about systemic treatment as she seems quite overwhelmed with the diagnosis and our discussion about surgery today.  Given her high risk histology, I recommend CT scan preoperatively to rule out metastatic disease. This is scheduled for 10/6.  There was more free fluid than I would have expected on her ultrasound, but locally the patient is completely asymptomatic.  We discussed the possibility that her CT scan shows metastatic disease.  In this case, I may recommend neoadjuvant therapy prior to surgery.  We reviewed the sentinel lymph node technique. Risks and benefits of sentinel lymph node biopsy was reviewed. We reviewed the technique and ICG dye. The patient DOES NOT have an iodine allergy or known liver dysfunction. We reviewed the false negative rate (0.4%), and that 3% of patients with metastatic disease will not have it detected by SLN biopsy in endometrial cancer. A low risk of  allergic reaction to the dye, <0.2% for ICG, has been reported. We also discussed that in the case of failed mapping, which occurs 40% of the time, a bilateral or unilateral lymphadenectomy will be performed at the surgeon's discretion.   Potential benefits of sentinel nodes including a higher detection rate for metastasis due to ultrastaging and potential reduction in operative morbidity. However, there remains uncertainty as to the role for treatment of micrometastatic disease. Further, the benefit of operative morbidity associated with the SLN technique in endometrial cancer is not yet completely known. In other patient populations (e.g. the cervical cancer population) there has been observed reductions in morbidity with SLN biopsy compared to pelvic lymphadenectomy. Lymphedema, nerve dysfunction and lymphocysts are all potential risks with the SLN technique as with complete lymphadenectomy. Additional risks to the patient include the risk of damage to an internal organ while operating in an altered view (e.g. the black and white image of the robotic fluorescence imaging mode).   The patient was consented for a robotic assisted hysterectomy, bilateral salpingo-oophorectomy, sentinel lymph node evaluation, possible lymph node dissection, omentectomy, possible laparotomy. The risks of surgery were discussed in detail and she understands these to include infection; wound separation; hernia; vaginal cuff separation, injury to adjacent organs such as bowel, bladder, blood vessels, ureters and nerves; bleeding which may require blood transfusion; anesthesia risk; thromboembolic events; possible death; unforeseen complications; possible need for re-exploration; medical complications such as heart attack, stroke, pleural effusion and pneumonia; and, if full lymphadenectomy is performed the risk of lymphedema and lymphocyst. The patient will receive DVT and antibiotic prophylaxis as indicated. She voiced a clear  understanding. She had the opportunity to ask questions.   We will plan  to have her return for perioperative visit.  I will have the office reach out to her medical oncologist in the setting of her thrombocythemia.  She will need to come off her blood thinner prior to surgery and likely could restart it within a couple of days after surgery.  We will plan to give her phylactic heparin before surgery.  Plan to draw CA-125 her to surgery.  We will reach out to her medical oncology team to see if we can get this done at her visit tomorrow.  She will also have a repeat CMP with a creatinine prior to her CT scan.  A copy of this note was sent to the patient's referring provider.   70 minutes of total time was spent for this patient encounter, including preparation, face-to-face counseling with the patient and coordination of care, and documentation of the encounter.  Jeral Pinch, MD  Division of Gynecologic Oncology  Department of Obstetrics and Gynecology  [facility]  ___________________________________________  Chief Complaint: Chief Complaint  Patient presents with   Serous carcinoma of body of uterus [facility])    History of Present Illness:  Alexandra [name] is a 71 y.o. y.o. female who is seen in consultation at the request of Dr. Talbert Nan for an evaluation of high-grade endometrial cancer.  Patient was initially seen in July after having postmenopausal bleeding from 6/6 - 6/16.  She described having light intermittent vaginal spotting.  Was wearing a pad, but mostly just saw the spotting on tissue when she wiped after voiding.  She denied any cramping or pain.  She underwent cervical polypectomy at that time with benign cervical polyp noted, no dysplasia or malignancy.  Subsequently, she continued to have intermittent light vaginal spotting.  She thinks she may have had an episode of light spotting about a year ago.  Pelvic ultrasound exam performed on 8/3  shows a uterus measuring 11.1 x 6.5 x 4.6 cm with multiple fibroids measuring up to 2.2 cm.  Endocervical lesion thought to represent a polyp measures 7 mm.  Endometrium is asymmetrically thickened measuring up to 22 mm, noted to be cystic and vascular concerning for possible polyps.  Moderate free fluid noted.  Neither ovary well appreciated.  Subsequently, on 8/11, the patient underwent endometrial biopsy showing benign endometrial polyp, scant benign endocervix.  On 11/18/2021, the patient was taken for an endocervical polypectomy, endometrial polypectomy, and D&C.  Findings at the time of surgery included an enlarged anteverted uterus, no adnexal masses.  Cervical polyp noted.  On hysteroscopy, multiple endometrial polyps with some thickening of the uterine wall noted.  Final pathology revealed benign cervical polyp with metaplastic changes.  Endometrial polyp revealed high-grade serous carcinoma as did the endometrial curettage specimen.  She endorses good appetite without nausea or emesis.  She denies any recent weight changes.  She reports good energy levels.  She reports normal bowel and bladder function.  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  Follows with Dr. Marin Olp for this.  She is on agrylin.  Her last Hgb A1c was 5.7% in July.  PAST MEDICAL HISTORY:  Past Medical History:  Diagnosis Date   Blood dyscrasia    Diabetes mellitus without complication ([place])    Goals of care, counseling/discussion 12/15/2019   History of kidney stones    5-6 yrs ago. 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  10/30/2021   DILATATION & CURETTAGE/HYSTEROSCOPY WITH MYOSURE N/A 11/18/2021   Procedure: DILATATION &  CURETTAGE/HYSTEROSCOPY WITH MYOSURE;  Surgeon: [name], MD;  Location: [place];  Service: Gynecology;  Laterality: N/A;   ECTOPIC PREGNANCY SURGERY  03/02/1982   salpingostomy    OB/GYN HISTORY:  OB History  Gravida Para Term Preterm AB Living  _0 SAB IAB Ectopic Multiple Live Births    1 1        # Outcome Date GA Lbr Len/2nd Weight Sex Delivery Anes PTL Lv  4 IAB           3 Term           2 Term           1 Ectopic             No LMP recorded. Patient is postmenopausal.  Age at menarche: 89  Age at menopause: 88 Hx of HRT: denies Hx of STDs: denies Last pap: 08/2021 - [facility] History of abnormal pap smears: no  SCREENING STUDIES:  Last mammogram: 10/2021  Last colonoscopy: 05/2014 Last bone mineral density: 2018  MEDICATIONS: Outpatient Encounter Medications as of 12/02/2021  Medication Sig   amLODipine (NORVASC) 10 MG tablet TAKE 1 TABLET BY MOUTH EVERY DAY (Patient taking differently: Take 10 mg by mouth daily. TAKE 1 TABLET BY MOUTH EVERY DAY)   anagrelide (AGRYLIN) 1 MG capsule TAKE 5 CAPSULES BY MOUTH DAILY (Patient taking differently: Take 1 mg by mouth daily. Pt takes 5 capsules every day)   aspirin 81 MG tablet Take 81 mg by mouth daily.   Fe Fum-FePoly-Vit C-Vit B3 (INTEGRA) 62.5-62.5-40-3 MG CAPS TAKE 1 CAPSULE BY MOUTH DAILY (Patient taking differently: Take 1 capsule by mouth daily.)   folic acid (FOLVITE) 1 MG tablet Take 2 tablets by mouth once daily   lisinopril (ZESTRIL) 20 MG tablet TAKE 2 TABLETS BY MOUTH EVERY DAY (Patient taking differently: Take 20 mg by mouth daily. TAKE 2 TABLETS BY MOUTH EVERY DAY)   Nebivolol HCl 20 MG TABS TAKE 1 TABLET(20 MG) BY MOUTH DAILY (Patient taking differently: Take 20 mg by mouth daily.)   Vitamin D, Ergocalciferol, (DRISDOL) 1.25 MG (50000 UNIT) CAPS capsule TAKE 1 CAPSULE BY MOUTH 1 TIME A WEEK (Patient taking differently: Take 50,000 Units by mouth every 7 (seven) days. TAKE 1  CAPSULE BY MOUTH 1 TIME A WEEK)   glucose blood (ONE TOUCH ULTRA TEST) test strip Use to test blood sugar 1 times daily. **PT NEEDS FOLLOW UP APPT FOR FURTHER REFILLS** (Patient not taking: Reported on 2/63/7858)   ONETOUCH DELICA LANCETS 85O MISC Use to test blood sugar 1 time daily. **PT NEEDS FOLLOW UP APPT FOR FURTHER REFILLS** (Patient not taking: Reported on 11/28/2021)   No facility-administered encounter medications on file as of 12/02/2021.    ALLERGIES:  No Known Allergies   FAMILY HISTORY:  Family History  Problem Relation Age of Onset   Heart disease Mother        died age 60   Kidney disease Mother        family hx   Heart attack Father 37       massive   Hypertension Sister    Hypertension Sister    Hypertension Sister  Hypertension Sister    Arthritis Other        family hx   Diabetes Other        family hx   Stroke Other         dialysis   Colon cancer Neg Hx    Breast cancer Neg Hx    Ovarian cancer Neg Hx    Endometrial cancer Neg Hx    Pancreatic cancer Neg Hx    Prostate cancer Neg Hx      SOCIAL HISTORY:  Social Connections: Moderately Integrated (12/25/2020)   Social Connection and Isolation Panel [NHANES]    Frequency of Communication with Friends and Family: Three times a week    Frequency of Social Gatherings with Friends and Family: Once a week    Attends Religious Services: More than 4 times per year    Active Member of [employer] or Organizations: No    Attends Music therapist: Never    Marital Status: Married    REVIEW OF SYSTEMS:  + vaginal bleeding Denies appetite changes, fevers, chills, fatigue, unexplained weight changes. Denies hearing loss, neck lumps or masses, mouth sores, ringing in ears or voice changes. Denies cough or wheezing.  Denies shortness of breath. Denies chest pain or palpitations. Denies leg swelling. Denies abdominal distention, pain, blood in stools, constipation, diarrhea, nausea, vomiting, or early  satiety. Denies pain with intercourse, dysuria, frequency, hematuria or incontinence. Denies hot flashes, pelvic pain or vaginal discharge.   Denies joint pain, back pain or muscle pain/cramps. Denies itching, rash, or wounds. Denies dizziness, headaches, numbness or seizures. Denies swollen lymph nodes or glands, denies easy bruising or bleeding. Denies anxiety, depression, confusion, or decreased concentration.  Physical Exam:  Vital Signs for this encounter:  Blood pressure 139/87, pulse 86, temperature 97.6 F (36.4 C), temperature source Oral, resp. rate 18, height 5' 10.08" (1.78 m), weight 125 lb 12.8 oz (57.1 kg), SpO2 100 %. Body mass index is 18.01 kg/m. General: Alert, oriented, no acute distress.  HEENT: Normocephalic, atraumatic. Sclera anicteric.  Chest: Clear to auscultation bilaterally. No wheezes, rhonchi, or rales. Cardiovascular: Regular rate and rhythm, no murmurs, rubs, or gallops.  Abdomen: Normoactive bowel sounds. Soft, nondistended, nontender to palpation. No masses or hepatosplenomegaly appreciated. No palpable fluid wave. Well healed Pfannenstiel incision. Extremities: Grossly normal range of motion. Warm, well perfused. No edema bilaterally.  Skin: No rashes or lesions.  Lymphatics: No cervical, supraclavicular, or inguinal adenopathy.  GU:  Normal external female genitalia. No lesions. No discharge or bleeding.             Bladder/urethra:  No lesions or masses, well supported bladder             Vagina: Mildly atrophic, no lesions.             Cervix: Normal appearing, no lesions.             Uterus: Small, mobile, no parametrial involvement or nodularity.             Adnexa: No masses.  Rectal: Deferred.  LABORATORY AND RADIOLOGIC DATA:  Outside medical records were reviewed to synthesize the above history, along with the history and physical obtained during the visit.   Lab Results  Component Value Date   WBC 6.6 11/18/2021   HGB 9.5 (L) 11/18/2021    HCT 28.0 (L) 11/18/2021   PLT 117 (L) 11/18/2021   GLUCOSE 124 (H) 11/18/2021   CHOL 253 (H) 04/22/2020   TRIG 56  04/22/2020   HDL 64 04/22/2020   LDLDIRECT 127.3 04/19/2012   LDLCALC 178 (H) 04/22/2020   ALT 11 10/24/2021   AST 13 (L) 10/24/2021   NA 143 11/18/2021   K 3.5 11/18/2021   CL 105 11/18/2021   CREATININE 1.50 (H) 11/18/2021   BUN 29 (H) 11/18/2021   CO2 29 10/24/2021   TSH 1.62 05/14/2016   INR 1.1 05/13/2018   HGBA1C 5.7 (H) 09/05/2021   MICROALBUR 6.1 (H) 08/25/2021

## 2021-12-02 NOTE — Patient Instructions (Addendum)
Plan on having a CT scan and Dr. Berline Lopes will notify you of the results.  Plan to proceed with your appointment with Dr. Marin Olp tomorrow and we will plan to obtain the CA 125 tumor marker lab at that visit.  We will tentatively plan for surgery at [facility] with Dr. Jeral Pinch on December 24, 2021. There is a chance October 18 may become available and our office will reach out if this occurs to see about moving your surgery up to a sooner date). We will see you back in the office closer to the date for a preop appointment with Joylene John NP to discuss the instrcutions for before and after surgery.  You may also receive a phone call from the hospital to arrange for a pre-op appointment there as well. Usually both appointments can be combined on the same day.

## 2021-12-03 ENCOUNTER — Encounter: Payer: Self-pay | Admitting: Hematology & Oncology

## 2021-12-03 ENCOUNTER — Inpatient Hospital Stay (HOSPITAL_BASED_OUTPATIENT_CLINIC_OR_DEPARTMENT_OTHER): Payer: Medicare PPO | Admitting: Hematology & Oncology

## 2021-12-03 ENCOUNTER — Inpatient Hospital Stay: Payer: Medicare PPO

## 2021-12-03 ENCOUNTER — Other Ambulatory Visit: Payer: Self-pay

## 2021-12-03 VITALS — BP 149/70 | HR 82 | Temp 97.9°F | Resp 17 | Ht 70.08 in | Wt 127.0 lb

## 2021-12-03 DIAGNOSIS — D638 Anemia in other chronic diseases classified elsewhere: Secondary | ICD-10-CM | POA: Diagnosis not present

## 2021-12-03 DIAGNOSIS — C541 Malignant neoplasm of endometrium: Secondary | ICD-10-CM

## 2021-12-03 DIAGNOSIS — Z7982 Long term (current) use of aspirin: Secondary | ICD-10-CM | POA: Diagnosis not present

## 2021-12-03 DIAGNOSIS — D473 Essential (hemorrhagic) thrombocythemia: Secondary | ICD-10-CM

## 2021-12-03 DIAGNOSIS — C55 Malignant neoplasm of uterus, part unspecified: Secondary | ICD-10-CM | POA: Diagnosis not present

## 2021-12-03 DIAGNOSIS — Z87442 Personal history of urinary calculi: Secondary | ICD-10-CM | POA: Diagnosis not present

## 2021-12-03 DIAGNOSIS — C549 Malignant neoplasm of corpus uteri, unspecified: Secondary | ICD-10-CM

## 2021-12-03 DIAGNOSIS — D500 Iron deficiency anemia secondary to blood loss (chronic): Secondary | ICD-10-CM

## 2021-12-03 DIAGNOSIS — E119 Type 2 diabetes mellitus without complications: Secondary | ICD-10-CM | POA: Diagnosis not present

## 2021-12-03 DIAGNOSIS — D631 Anemia in chronic kidney disease: Secondary | ICD-10-CM

## 2021-12-03 DIAGNOSIS — Z79899 Other long term (current) drug therapy: Secondary | ICD-10-CM | POA: Diagnosis not present

## 2021-12-03 LAB — CBC WITH DIFFERENTIAL (CANCER CENTER ONLY)
Abs Immature Granulocytes: 0.04 10*3/uL (ref 0.00–0.07)
Basophils Absolute: 0.1 10*3/uL (ref 0.0–0.1)
Basophils Relative: 1 %
Eosinophils Absolute: 0.1 10*3/uL (ref 0.0–0.5)
Eosinophils Relative: 1 %
HCT: 26.2 % — ABNORMAL LOW (ref 36.0–46.0)
Hemoglobin: 8 g/dL — ABNORMAL LOW (ref 12.0–15.0)
Immature Granulocytes: 1 %
Lymphocytes Relative: 20 %
Lymphs Abs: 1.4 10*3/uL (ref 0.7–4.0)
MCH: 24.9 pg — ABNORMAL LOW (ref 26.0–34.0)
MCHC: 30.5 g/dL (ref 30.0–36.0)
MCV: 81.6 fL (ref 80.0–100.0)
Monocytes Absolute: 0.7 10*3/uL (ref 0.1–1.0)
Monocytes Relative: 10 %
Neutro Abs: 4.6 10*3/uL (ref 1.7–7.7)
Neutrophils Relative %: 67 %
Platelet Count: 359 10*3/uL (ref 150–400)
RBC: 3.21 MIL/uL — ABNORMAL LOW (ref 3.87–5.11)
RDW: 19.9 % — ABNORMAL HIGH (ref 11.5–15.5)
WBC Count: 6.9 10*3/uL (ref 4.0–10.5)
nRBC: 0 % (ref 0.0–0.2)

## 2021-12-03 LAB — CMP (CANCER CENTER ONLY)
ALT: 11 U/L (ref 0–44)
AST: 14 U/L — ABNORMAL LOW (ref 15–41)
Albumin: 4.4 g/dL (ref 3.5–5.0)
Alkaline Phosphatase: 67 U/L (ref 38–126)
Anion gap: 7 (ref 5–15)
BUN: 25 mg/dL — ABNORMAL HIGH (ref 8–23)
CO2: 29 mmol/L (ref 22–32)
Calcium: 9.8 mg/dL (ref 8.9–10.3)
Chloride: 105 mmol/L (ref 98–111)
Creatinine: 1.07 mg/dL — ABNORMAL HIGH (ref 0.44–1.00)
GFR, Estimated: 56 mL/min — ABNORMAL LOW (ref >60)
Glucose, Bld: 121 mg/dL — ABNORMAL HIGH (ref 70–99)
Potassium: 3.6 mmol/L (ref 3.5–5.1)
Sodium: 141 mmol/L (ref 135–145)
Total Bilirubin: 0.7 mg/dL (ref 0.3–1.2)
Total Protein: 7.5 g/dL (ref 6.5–8.1)

## 2021-12-03 LAB — LACTATE DEHYDROGENASE: LDH: 212 U/L — ABNORMAL HIGH (ref 98–192)

## 2021-12-03 LAB — RETICULOCYTES
Immature Retic Fract: 16.8 % — ABNORMAL HIGH (ref 2.3–15.9)
RBC.: 3.22 MIL/uL — ABNORMAL LOW (ref 3.87–5.11)
Retic Count, Absolute: 35.1 10*3/uL (ref 19.0–186.0)
Retic Ct Pct: 1.1 % (ref 0.4–3.1)

## 2021-12-03 LAB — FERRITIN: Ferritin: 200 ng/mL (ref 11–307)

## 2021-12-03 LAB — SAVE SMEAR(SSMR), FOR PROVIDER SLIDE REVIEW

## 2021-12-03 NOTE — Progress Notes (Signed)
[name]  It is a [employer].  Hematology and Oncology Follow Up Visit  Alexandra [name] [id number] [birth year] 71 y.o. 12/03/2021   Principle Diagnosis:  Essential thrombocythemia -Calreticulin (+)  High-grade serous carcinoma of the uterus  Anemia of erythropoietin deficiency   Current Therapy:   Anagrelide 5 mg p.o. daily  Folic acid 2 mg p.o. daily  Aspirin 81 mg p.o. daily  Procrit 40,000 units SQ weekly for hemoglobin less than 10-patient declined to date  Interim History:  Alexandra [name] is back for followup.  Unfortunately, we now have a new problem.  She had some postmenopausal bleeding.  She saw her gynecologist.  Dr. Talbert Nan, being incredibly thorough as she is, went ahead and did a biopsy.  The biopsy was done on 11/18/2021.  The pathology report [id number]) showed high-grade serous carcinoma.  She has seen Dr. Berline Lopes of Gynecologic Oncology.  Dr. Berline Lopes is going to do a hysterectomy.  I think the date for this is 12/24/2021.    I just really hate this for her.  I know that she is not looking forward to surgery.  She is very worried about having to take any radiation or chemotherapy after surgery.  She is incredibly reluctant to do this if she needs to.  I am worried about the fact that she is quite anemic.  Her hemoglobin is only 8.  I think it would be safe to try to get her hemoglobin up to make surgery safer for her.  I will have to talk to Dr. Berline Lopes about this.  She has had no abdominal pain.  There has been no cough or shortness of breath.  She has had no bony pain.  The she is due for a CT scan this Friday.  She has done well with the anagrelide.  Her platelet count is holding steady.  She has had no rashes.  There is been no leg swelling.  She has had no fever.  Overall, I would say performance status is probably ECOG 1.    Medications:  Current Outpatient Medications:    amLODipine (NORVASC) 10 MG tablet, TAKE 1 TABLET BY MOUTH EVERY DAY (Patient taking  differently: Take 10 mg by mouth daily. TAKE 1 TABLET BY MOUTH EVERY DAY), Disp: 30 tablet, Rfl: 0   anagrelide (AGRYLIN) 1 MG capsule, TAKE 5 CAPSULES BY MOUTH DAILY (Patient taking differently: Take 1 mg by mouth daily. Pt takes 5 capsules every day), Disp: 450 capsule, Rfl: 3   aspirin 81 MG tablet, Take 81 mg by mouth daily., Disp: , Rfl:    Fe Fum-FePoly-Vit C-Vit B3 (INTEGRA) 62.5-62.5-40-3 MG CAPS, TAKE 1 CAPSULE BY MOUTH DAILY (Patient taking differently: Take 1 capsule by mouth daily.), Disp: 30 capsule, Rfl: 8   folic acid (FOLVITE) 1 MG tablet, Take 2 tablets by mouth once daily, Disp: 60 tablet, Rfl: 6   glucose blood (ONE TOUCH ULTRA TEST) test strip, Use to test blood sugar 1 times daily. **PT NEEDS FOLLOW UP APPT FOR FURTHER REFILLS** (Patient not taking: Reported on 11/28/2021), Disp: 100 each, Rfl: 0   lisinopril (ZESTRIL) 20 MG tablet, TAKE 2 TABLETS BY MOUTH EVERY DAY (Patient taking differently: Take 20 mg by mouth daily. TAKE 2 TABLETS BY MOUTH EVERY DAY), Disp: 180 tablet, Rfl: 3   Nebivolol HCl 20 MG TABS, TAKE 1 TABLET(20 MG) BY MOUTH DAILY (Patient taking differently: Take 20 mg by mouth daily.), Disp: 90 tablet, Rfl: 0   ONETOUCH DELICA LANCETS 82N MISC, Use  to test blood sugar 1 time daily. **PT NEEDS FOLLOW UP APPT FOR FURTHER REFILLS** (Patient not taking: Reported on 11/28/2021), Disp: 100 each, Rfl: 1   Vitamin D, Ergocalciferol, (DRISDOL) 1.25 MG (50000 UNIT) CAPS capsule, TAKE 1 CAPSULE BY MOUTH 1 TIME A WEEK (Patient taking differently: Take 50,000 Units by mouth every 7 (seven) days. TAKE 1 CAPSULE BY MOUTH 1 TIME A WEEK), Disp: 12 capsule, Rfl: 2  Allergies: No Known Allergies  Past Medical History, Surgical history, Social history, and Family History were reviewed and updated.  Review of Systems: Review of Systems  All other systems reviewed and are negative. Marland Kitchen  Physical Exam:  height is 5' 10.08" (1.78 m) and weight is 127 lb (57.6 kg). Her oral temperature is  97.9 F (36.6 C). Her blood pressure is 149/70 (abnormal) and her pulse is 82. Her respiration is 17 and oxygen saturation is 100%. =jh  Physical Exam Vitals reviewed.  HENT:     Head: Normocephalic and atraumatic.  Eyes:     Pupils: Pupils are equal, round, and reactive to light.  Cardiovascular:     Rate and Rhythm: Normal rate and regular rhythm.     Heart sounds: Normal heart sounds.  Pulmonary:     Effort: Pulmonary effort is normal.     Breath sounds: Normal breath sounds.  Abdominal:     General: Bowel sounds are normal.     Palpations: Abdomen is soft.  Musculoskeletal:        General: No tenderness or deformity. Normal range of motion.     Cervical back: Normal range of motion.  Lymphadenopathy:     Cervical: No cervical adenopathy.  Skin:    General: Skin is warm and dry.     Findings: No erythema or rash.  Neurological:     Mental Status: She is alert and oriented to person, place, and time.  Psychiatric:        Behavior: Behavior normal.        Thought Content: Thought content normal.        Judgment: Judgment normal.     Lab Results  Component Value Date   WBC 6.9 12/03/2021   HGB 8.0 (L) 12/03/2021   HCT 26.2 (L) 12/03/2021   MCV 81.6 12/03/2021   PLT 359 12/03/2021     Chemistry      Component Value Date/Time   NA 143 11/18/2021 0822   NA 144 02/02/2017 1158   NA 140 07/24/2016 1007   K 3.5 11/18/2021 0822   K 3.7 02/02/2017 1158   K 3.6 07/24/2016 1007   CL 105 11/18/2021 0822   CL 103 02/02/2017 1158   CO2 29 10/24/2021 1014   CO2 29 02/02/2017 1158   CO2 26 07/24/2016 1007   BUN 29 (H) 11/18/2021 0822   BUN 20 02/02/2017 1158   BUN 30.3 (H) 07/24/2016 1007   CREATININE 1.50 (H) 11/18/2021 0822   CREATININE 1.16 (H) 10/24/2021 1014   CREATININE 0.9 02/02/2017 1158   CREATININE 1.1 07/24/2016 1007   GLU 129 02/02/2017 0000      Component Value Date/Time   CALCIUM 9.4 10/24/2021 1014   CALCIUM 9.0 02/02/2017 1158   CALCIUM 9.3  07/24/2016 1007   ALKPHOS 80 10/24/2021 1014   ALKPHOS 110 (H) 02/02/2017 1158   ALKPHOS 104 07/24/2016 1007   AST 13 (L) 10/24/2021 1014   AST 13 07/24/2016 1007   ALT 11 10/24/2021 1014   ALT 26 02/02/2017 1158   ALT  15 07/24/2016 1007   BILITOT 0.6 10/24/2021 1014   BILITOT 0.55 07/24/2016 1007      Impression and Plan: Alexandra [name] is 71 year old [name]. She has essential thrombocythemia. She actually is Calreticulin positive.  The problem now is as endometrial carcinoma.  We will have to see what the CT scan shows and what happens with surgery.  Again, I really believe that the hemoglobin really needs to get higher.  Again I will have to talk to Dr. Berline Lopes about this.  I think we can use Procrit weekly on her, we can have the best chance of getting her hemoglobin higher.  Her platelet count is adequate so I do not see a problem with her having any issues with bleeding with surgery.  She can stop the anagrelide 5 days before surgery if necessary.  I know this is a huge stressor for Alexandra [name].  However, she is incredibly strong.  She has a really strong constitution.  She we will get through this.  Hopefully, this will be a early stage endometrial cancer and that she will not need any kind of postop radiation or chemotherapy.  I am not to make an appointment for her as of yet until we know when surgery will be.  If we need to do Procrit on her, then we will have her come back weekly before she has surgery.     [name], MD 10/4/202311:28 AM

## 2021-12-03 NOTE — Addendum Note (Signed)
Addended by: Burney Gauze R on: 12/03/2021 05:16 PM   Modules accepted: Orders

## 2021-12-04 ENCOUNTER — Telehealth: Payer: Self-pay | Admitting: *Deleted

## 2021-12-04 LAB — IRON AND IRON BINDING CAPACITY (CC-WL,HP ONLY)
Iron: 68 ug/dL (ref 28–170)
Saturation Ratios: 28 % (ref 10.4–31.8)
TIBC: 244 ug/dL — ABNORMAL LOW (ref 250–450)
UIBC: 176 ug/dL (ref 148–442)

## 2021-12-04 NOTE — Telephone Encounter (Signed)
Dr Marin Olp left a VM on patients phone on 12/05/21 detailing need for ESA prior to surgery.  Received a call from patient today with issues regarding scheduling injections.  Called patient back to let her know Dr Marin Olp talked with her surgeon Dr Berline Lopes who wants her Hgb greater than 9 before surgery on 12/24/21.  Dr Marin Olp would like for patient to get 3 doses of Retacrit prior to surgery.  Medication is approved per Ross Ludwig.  Per Carolynne Edouard [facility] patient can get weekly injections x 3 if  patient wants to be scheduled. Told patient to call us back to let us know if this is possible to schedule

## 2021-12-04 NOTE — Telephone Encounter (Signed)
PAtient called back to let us know that she cant begin Retacrit injections until week of Oct 16.  Patient is going to call Dr Lulu Riding office to see if surgery date can be moved back so that she can have 3 doses of Retacrit a week apart.  She will call us after her surgery date is moved and schedule the injections with [place] at that time.

## 2021-12-05 ENCOUNTER — Ambulatory Visit (HOSPITAL_COMMUNITY)
Admission: RE | Admit: 2021-12-05 | Discharge: 2021-12-05 | Disposition: A | Discharge status: Home / Self Care | Payer: Medicare PPO | Source: Ambulatory Visit | Attending: Gynecologic Oncology | Admitting: Gynecologic Oncology

## 2021-12-05 ENCOUNTER — Telehealth: Payer: Self-pay | Admitting: *Deleted

## 2021-12-05 DIAGNOSIS — R7989 Other specified abnormal findings of blood chemistry: Secondary | ICD-10-CM | POA: Diagnosis not present

## 2021-12-05 DIAGNOSIS — I700 Atherosclerosis of aorta: Secondary | ICD-10-CM | POA: Insufficient documentation

## 2021-12-05 DIAGNOSIS — I3139 Other pericardial effusion (noninflammatory): Secondary | ICD-10-CM | POA: Diagnosis not present

## 2021-12-05 DIAGNOSIS — K769 Liver disease, unspecified: Secondary | ICD-10-CM | POA: Diagnosis not present

## 2021-12-05 DIAGNOSIS — N6342 Unspecified lump in left breast, subareolar: Secondary | ICD-10-CM | POA: Insufficient documentation

## 2021-12-05 DIAGNOSIS — C541 Malignant neoplasm of endometrium: Secondary | ICD-10-CM | POA: Insufficient documentation

## 2021-12-05 DIAGNOSIS — K661 Hemoperitoneum: Secondary | ICD-10-CM | POA: Diagnosis not present

## 2021-12-05 LAB — CA 125: Cancer Antigen (CA) 125: 17.1 U/mL (ref 0.0–38.1)

## 2021-12-05 MED ORDER — SODIUM CHLORIDE (PF) 0.9 % IJ SOLN
INTRAMUSCULAR | Status: AC
  Filled 2021-12-05: qty 50

## 2021-12-05 MED ORDER — IOHEXOL 300 MG/ML  SOLN
100.0000 mL | Freq: Once | INTRAMUSCULAR | Status: AC | PRN
  Administered 2021-12-05: 100 mL via INTRAVENOUS

## 2021-12-05 NOTE — Telephone Encounter (Signed)
Patient called and stated "I am to have surgery with Dr Berline Lopes on 10/25. Due to a low red blood cell count per Dr Marin Olp and Dr Berline Lopes I need to hac three doses of Retacrit one week apart. I can't start the medicine until the week of 10/16. So I need to have someone call be back to discuss new surgery date."

## 2021-12-05 NOTE — Telephone Encounter (Signed)
Spoke with pt to relay the following  from message from Joylene John NP:  Dr. Berline Lopes has reached out to Dr. Marin Olp. Dr. Berline Lopes does not want to delay surgery to a later date given it is almost one month out.

## 2021-12-08 ENCOUNTER — Other Ambulatory Visit: Payer: Medicare PPO

## 2021-12-09 ENCOUNTER — Telehealth: Payer: Self-pay | Admitting: Gynecologic Oncology

## 2021-12-09 NOTE — Telephone Encounter (Signed)
Discussed CT results.  Patient continues to voice hesitation about moving forward with the surgery on the date that we have scheduled.  She is very worried about her anemia.  She has seen Dr. Marin Olp with recommendation for IV infusions.  Given high risk histology, my recommendation would be not to delay for to administer 3 doses of this medication.  The patient will not be able to get her first dose until next week.  We can plan to check her blood level on the morning of surgery and if low or significant bleeding during surgery, give her blood transfusion.  All the patient's questions answered.  I stressed that while we can of course move surgery, my medical recommendation is that we proceed with surgery on the date that we have scheduled.  She will reach out to Dr. Antonieta Pert office and call my office later this week to let me know if we are keeping surgery on the 25th or rescheduling.  Jeral Pinch MD Gynecologic Oncology

## 2021-12-10 ENCOUNTER — Telehealth: Payer: Self-pay | Admitting: *Deleted

## 2021-12-10 DIAGNOSIS — D500 Iron deficiency anemia secondary to blood loss (chronic): Secondary | ICD-10-CM

## 2021-12-10 DIAGNOSIS — D473 Essential (hemorrhagic) thrombocythemia: Secondary | ICD-10-CM

## 2021-12-10 DIAGNOSIS — C541 Malignant neoplasm of endometrium: Secondary | ICD-10-CM

## 2021-12-10 DIAGNOSIS — C549 Malignant neoplasm of corpus uteri, unspecified: Secondary | ICD-10-CM

## 2021-12-10 NOTE — Telephone Encounter (Signed)
Call received from patient wanting to know if Dr Marin Olp is OK with her getting Retacrit on 12/15/21 and on 12/22/21 prior to her surgery on 12/24/21, instead of getting three injections of Retacrit like Dr. Marin Olp had previously recommended for her to receive.  Dr. Marin Olp notified.  Call placed back to patient and patient notified that Dr. Marin Olp would like for her to have labs and an injection of Retacrit on 12/15/21, 12/18/21 and 12/22/21, for a total of 3 doses of Retacrit.  Pt states that she will take the three Retacrit injections on those days and requests morning appts.  Message sent to scheduling and Dr. Marin Olp aware.

## 2021-12-13 ENCOUNTER — Other Ambulatory Visit: Payer: Self-pay | Admitting: Hematology & Oncology

## 2021-12-14 ENCOUNTER — Encounter: Payer: Medicare PPO | Admitting: Gynecologic Oncology

## 2021-12-15 ENCOUNTER — Inpatient Hospital Stay: Payer: Medicare PPO

## 2021-12-15 ENCOUNTER — Other Ambulatory Visit: Payer: Self-pay | Admitting: Internal Medicine

## 2021-12-15 ENCOUNTER — Other Ambulatory Visit: Payer: Self-pay | Admitting: Pharmacist

## 2021-12-15 ENCOUNTER — Encounter: Payer: Self-pay | Admitting: Family

## 2021-12-15 ENCOUNTER — Inpatient Hospital Stay (HOSPITAL_BASED_OUTPATIENT_CLINIC_OR_DEPARTMENT_OTHER): Payer: Medicare PPO | Admitting: Gynecologic Oncology

## 2021-12-15 VITALS — BP 142/80 | HR 110 | Temp 97.8°F | Resp 18 | Wt 130.2 lb

## 2021-12-15 VITALS — BP 148/74 | HR 90 | Temp 98.4°F | Resp 17

## 2021-12-15 DIAGNOSIS — C541 Malignant neoplasm of endometrium: Secondary | ICD-10-CM

## 2021-12-15 DIAGNOSIS — C55 Malignant neoplasm of uterus, part unspecified: Secondary | ICD-10-CM | POA: Diagnosis not present

## 2021-12-15 DIAGNOSIS — D473 Essential (hemorrhagic) thrombocythemia: Secondary | ICD-10-CM | POA: Diagnosis not present

## 2021-12-15 DIAGNOSIS — C549 Malignant neoplasm of corpus uteri, unspecified: Secondary | ICD-10-CM

## 2021-12-15 DIAGNOSIS — D631 Anemia in chronic kidney disease: Secondary | ICD-10-CM

## 2021-12-15 DIAGNOSIS — Z87442 Personal history of urinary calculi: Secondary | ICD-10-CM | POA: Diagnosis not present

## 2021-12-15 DIAGNOSIS — D500 Iron deficiency anemia secondary to blood loss (chronic): Secondary | ICD-10-CM

## 2021-12-15 DIAGNOSIS — E119 Type 2 diabetes mellitus without complications: Secondary | ICD-10-CM | POA: Diagnosis not present

## 2021-12-15 DIAGNOSIS — Z79899 Other long term (current) drug therapy: Secondary | ICD-10-CM | POA: Diagnosis not present

## 2021-12-15 DIAGNOSIS — D638 Anemia in other chronic diseases classified elsewhere: Secondary | ICD-10-CM | POA: Diagnosis not present

## 2021-12-15 DIAGNOSIS — D508 Other iron deficiency anemias: Secondary | ICD-10-CM

## 2021-12-15 DIAGNOSIS — Z7982 Long term (current) use of aspirin: Secondary | ICD-10-CM | POA: Diagnosis not present

## 2021-12-15 DIAGNOSIS — D471 Chronic myeloproliferative disease: Secondary | ICD-10-CM

## 2021-12-15 LAB — CBC WITH DIFFERENTIAL (CANCER CENTER ONLY)
Abs Immature Granulocytes: 0.07 10*3/uL (ref 0.00–0.07)
Basophils Absolute: 0.1 10*3/uL (ref 0.0–0.1)
Basophils Relative: 1 %
Eosinophils Absolute: 0.1 10*3/uL (ref 0.0–0.5)
Eosinophils Relative: 2 %
HCT: 25.9 % — ABNORMAL LOW (ref 36.0–46.0)
Hemoglobin: 8 g/dL — ABNORMAL LOW (ref 12.0–15.0)
Immature Granulocytes: 1 %
Lymphocytes Relative: 22 %
Lymphs Abs: 1.7 10*3/uL (ref 0.7–4.0)
MCH: 25.2 pg — ABNORMAL LOW (ref 26.0–34.0)
MCHC: 30.9 g/dL (ref 30.0–36.0)
MCV: 81.7 fL (ref 80.0–100.0)
Monocytes Absolute: 0.8 10*3/uL (ref 0.1–1.0)
Monocytes Relative: 10 %
Neutro Abs: 4.8 10*3/uL (ref 1.7–7.7)
Neutrophils Relative %: 64 %
Platelet Count: 322 10*3/uL (ref 150–400)
RBC: 3.17 MIL/uL — ABNORMAL LOW (ref 3.87–5.11)
RDW: 19.5 % — ABNORMAL HIGH (ref 11.5–15.5)
WBC Count: 7.6 10*3/uL (ref 4.0–10.5)
nRBC: 0 % (ref 0.0–0.2)

## 2021-12-15 MED ORDER — SENNOSIDES-DOCUSATE SODIUM 8.6-50 MG PO TABS: 2.0000 | ORAL_TABLET | Freq: Every day | ORAL | 0 refills | Status: DC

## 2021-12-15 MED ORDER — TRAMADOL HCL 50 MG PO TABS: 50.0000 mg | ORAL_TABLET | Freq: Four times a day (QID) | ORAL | 0 refills | Status: DC | PRN

## 2021-12-15 MED ORDER — EPOETIN ALFA-EPBX 40000 UNIT/ML IJ SOLN
40000.0000 [IU] | Freq: Once | INTRAMUSCULAR | Status: AC
  Administered 2021-12-15: 40000 [IU] via SUBCUTANEOUS
  Filled 2021-12-15: qty 1

## 2021-12-15 NOTE — Patient Instructions (Signed)
Preparing for your Surgery  Plan for surgery on December 24, 2021 with Dr. Jeral Pinch at [place] will be scheduled for robotic assisted hysterectomy (removal of uterus and cervix), bilateral salpingo-oophorectomy (removal of both ovaries and fallopian tubes), sentinel lymph node evaluation, possible lymph node dissection, omentectomy (removal of omentum), possible laparotomy (possible larger incision if needed).   Pre-operative Testing -(Done, 10/18) You will receive a phone call from presurgical testing at [facility] to arrange for a pre-operative appointment and lab work.  -Bring your insurance card, copy of an advanced directive if applicable, medication list  -At that visit, you will be asked to sign a consent for a possible blood transfusion in case a transfusion becomes necessary during surgery.  The need for a blood transfusion is rare but having consent is a necessary part of your care.     -You can continue taking your baby aspirin 81 mg with the last dose being the day before surgery.  -Do not take supplements such as fish oil (omega 3), red yeast rice, turmeric before your surgery. You want to avoid medications with aspirin in them including headache powders such as BC or Goody's), Excedrin migraine.  Day Before Surgery at [facility] will be asked to take in a light diet the day before surgery. You will be advised you can have clear liquids up until 3 hours before your surgery.    Eat a light diet the day before surgery.  Examples including soups, broths, toast, yogurt, mashed potatoes.  AVOID GAS PRODUCING FOODS. Things to avoid include carbonated beverages (fizzy beverages, sodas), raw fruits and raw vegetables (uncooked), or beans.   If your bowels are filled with gas, your surgeon will have difficulty visualizing your pelvic organs which increases your surgical risks.  Your role in recovery Your role is to become active as soon as directed by your  doctor, while still giving yourself time to heal.  Rest when you feel tired. You will be asked to do the following in order to speed your recovery:  - Cough and breathe deeply. This helps to clear and expand your lungs and can prevent pneumonia after surgery.  - [place]. Do mild physical activity. Walking or moving your legs help your circulation and body functions return to normal. Do not try to get up or walk alone the first time after surgery.   -If you develop swelling on one leg or the other, pain in the back of your leg, redness/warmth in one of your legs, please call the office or go to the Emergency Room to have a doppler to rule out a blood clot. For shortness of breath, chest pain-seek care in the Emergency Room as soon as possible. - Actively manage your pain. Managing your pain lets you move in comfort. We will ask you to rate your pain on a scale of zero to 10. It is your responsibility to tell your doctor or nurse where and how much you hurt so your pain can be treated.  Special Considerations -If you are diabetic, you may be placed on insulin after surgery to have closer control over your blood sugars to promote healing and recovery.  This does not mean that you will be discharged on insulin.  If applicable, your oral antidiabetics will be resumed when you are tolerating a solid diet.  -Your final pathology results from surgery should be available around one week after surgery and the results will be relayed  to you when available.  -FMLA forms can be faxed to [phone] and please allow 5-7 business days for completion.  Pain Management After Surgery -You have been prescribed your pain medication and bowel regimen medications before surgery so that you can have these available when you are discharged from the hospital. The pain medication is for use ONLY AFTER surgery and a new prescription will not be given.   -Make sure that you have Tylenol IF YOU ARE ABLE  TO TAKE THESE MEDICATION at home to use on a regular basis after surgery for pain control.   -Review the attached handout on narcotic use and their risks and side effects.   Bowel Regimen -You have been prescribed Sennakot-S to take nightly to prevent constipation especially if you are taking the narcotic pain medication intermittently.  It is important to prevent constipation and drink adequate amounts of liquids. You can stop taking this medication when you are not taking pain medication and you are back on your normal bowel routine.  Risks of Surgery Risks of surgery are low but include bleeding, infection, damage to surrounding structures, re-operation, blood clots, and very rarely death.   Blood Transfusion Information (For the consent to be signed before surgery)  We will be checking your blood type before surgery so in case of emergencies, we will know what type of blood you would need.                                            WHAT IS A BLOOD TRANSFUSION?  A transfusion is the replacement of blood or some of its parts. Blood is made up of multiple cells which provide different functions. Red blood cells carry oxygen and are used for blood loss replacement. White blood cells fight against infection. Platelets control bleeding. Plasma helps clot blood. Other blood products are available for specialized needs, such as hemophilia or other clotting disorders. BEFORE THE TRANSFUSION  Who gives blood for transfusions?  You may be able to donate blood to be used at a later date on yourself (autologous donation). Relatives can be asked to donate blood. This is generally not any safer than if you have received blood from a stranger. The same precautions are taken to ensure safety when a relative's blood is donated. Healthy volunteers who are fully evaluated to make sure their blood is safe. This is blood bank blood. Transfusion therapy is the safest it has ever been in the practice of  medicine. Before blood is taken from a donor, a complete history is taken to make sure that person has no history of diseases nor engages in risky social behavior (examples are intravenous drug use or sexual activity with multiple partners). The donor's travel history is screened to minimize risk of transmitting infections, such as malaria. The donated blood is tested for signs of infectious diseases, such as HIV and hepatitis. The blood is then tested to be sure it is compatible with you in order to minimize the chance of a transfusion reaction. If you or a relative donates blood, this is often done in anticipation of surgery and is not appropriate for emergency situations. It takes many days to process the donated blood. RISKS AND COMPLICATIONS Although transfusion therapy is very safe and saves many lives, the main dangers of transfusion include:  Getting an infectious disease. Developing a transfusion reaction. This is an allergic  reaction to something in the blood you were given. Every precaution is taken to prevent this. The decision to have a blood transfusion has been considered carefully by your caregiver before blood is given. Blood is not given unless the benefits outweigh the risks.  AFTER SURGERY INSTRUCTIONS  Return to work: 4-6 weeks if applicable  Activity: 1. Be up and out of the bed during the day.  Take a nap if needed.  You may walk up steps but be careful and use the hand rail.  Stair climbing will tire you more than you think, you may need to stop part way and rest.   2. No lifting or straining for 6 weeks over 10 pounds. No pushing, pulling, straining for 6 weeks.  3. No driving for around 1 week(s).  Do not drive if you are taking narcotic pain medicine and make sure that your reaction time has returned.   4. You can shower as soon as the next day after surgery. Shower daily.  Use your regular soap and water (not directly on the incision) and pat your incision(s) dry  afterwards; don't rub.  No tub baths or submerging your body in water until cleared by your surgeon. If you have the soap that was given to you by pre-surgical testing that was used before surgery, you do not need to use it afterwards because this can irritate your incisions.   5. No sexual activity and nothing in the vagina for 8-10 weeks.  6. You may experience a small amount of clear drainage from your incisions, which is normal.  If the drainage persists, increases, or changes color please call the office.  7. Do not use creams, lotions, or ointments such as neosporin on your incisions after surgery until advised by your surgeon because they can cause removal of the dermabond glue on your incisions.    8. You may experience vaginal spotting after surgery or around the 6-8 week mark from surgery when the stitches at the top of the vagina begin to dissolve.  The spotting is normal but if you experience heavy bleeding, call our office.  9. Take Tylenol first for pain if you are able to take these medication and only use narcotic pain medication for severe pain not relieved by the Tylenol.  Monitor your Tylenol intake to a max of 4,000 mg in a 24 hour period.   Diet: 1. Low sodium Heart Healthy Diet is recommended but you are cleared to resume your normal (before surgery) diet after your procedure.  2. It is safe to use a laxative, such as Miralax or Colace, if you have difficulty moving your bowels. You have been prescribed Sennakot-S to take at bedtime every evening after surgery to keep bowel movements regular and to prevent constipation.    Wound Care: 1. Keep clean and dry.  Shower daily.  Reasons to call the Doctor: Fever - Oral temperature greater than 100.4 degrees Fahrenheit Foul-smelling vaginal discharge Difficulty urinating Nausea and vomiting Increased pain at the site of the incision that is unrelieved with pain medicine. Difficulty breathing with or without chest pain New  calf pain especially if only on one side Sudden, continuing increased vaginal bleeding with or without clots.   Contacts: For questions or concerns you should contact:  Dr. Jeral Pinch at [phone]  Joylene John, NP at [phone]  After Hours: call [phone] and have the GYN Oncologist paged/contacted (after 5 pm or on the weekends).  Messages sent via mychart are for non-urgent  matters and are not responded to after hours so for urgent needs, please call the after hours number.

## 2021-12-15 NOTE — Patient Instructions (Signed)

## 2021-12-15 NOTE — Progress Notes (Signed)
Patient here for a pre-operative appointment prior to her scheduled surgery on December 24, 2021. She is scheduled for .  She has her pre-admission testing appointment on 10/18 at [facility].  The surgery was discussed in detail.  See after visit summary for additional details. Visual aids used to discuss items related to surgery.     Discussed post-op pain management in detail including the aspects of the enhanced recovery pathway.  Advised her that a new prescription would be sent in for tramadol and it is only to be used for after her upcoming surgery.  We discussed the use of tylenol post-op and to monitor for a maximum of 4,000 mg in a 24 hour period.  Also prescribed sennakot to be used after surgery and to hold if having loose stools.  Discussed bowel regimen in detail.     Discussed measures to take at home to prevent DVT including frequent mobility.  Reportable signs and symptoms of DVT discussed. Post-operative instructions discussed and expectations for after surgery. Incisional care discussed as well including reportable signs and symptoms including erythema, drainage, wound separation.     30 minutes spent with the patient.  Verbalizing understanding of material discussed. No needs or concerns voiced at the end of the visit.   Advised patient to call for any needs.  Advised that her post-operative medications had been prescribed and could be picked up at any time.  Advised our office will reach out to Dr. Myna Hidalgo about when to stop and resume her anagrelide and we will let her know.  This appointment is included in the global surgical bundle as pre-operative teaching and has no charge.

## 2021-12-16 ENCOUNTER — Inpatient Hospital Stay: Payer: Medicare PPO | Admitting: Gynecologic Oncology

## 2021-12-16 ENCOUNTER — Telehealth: Payer: Self-pay

## 2021-12-16 ENCOUNTER — Other Ambulatory Visit: Payer: Self-pay | Admitting: Hematology & Oncology

## 2021-12-16 DIAGNOSIS — D508 Other iron deficiency anemias: Secondary | ICD-10-CM

## 2021-12-16 DIAGNOSIS — D473 Essential (hemorrhagic) thrombocythemia: Secondary | ICD-10-CM

## 2021-12-16 NOTE — Telephone Encounter (Signed)
Called Dr. Marin Olp to request instructions on when to stop Agrylin prior to surgery on 12/24/21. Dr. Marin Olp instructed for pt to stop Agrylin 5 days prior to surgery and to restart Agrylin 2 weeks post-op. Called patient with this information and she voiced understanding.

## 2021-12-16 NOTE — Patient Instructions (Signed)
SURGICAL WAITING ROOM VISITATION Patients having surgery or a procedure may have no more than 2 support people in the waiting area - these visitors may rotate.   Children under the age of 12 must have an adult with them who is not the patient. If the patient needs to stay at the hospital during part of their recovery, the visitor guidelines for inpatient rooms apply. Pre-op nurse will coordinate an appropriate time for 1 support person to accompany patient in pre-op.  This support person may not rotate.    Please refer to the [facility] website for the visitor guidelines for Inpatients (after your surgery is over and you are in a regular room).      Your procedure is scheduled on: 12-24-21   Report to [facility] Main Entrance    Report to admitting at 5:15 AM   Call this number if you have problems the morning of surgery [phone]   Follow a light diet day before surgery (avoid gas producing foods and carbonated beverages)   Do not eat food :After Midnight.   After Midnight you may have the following liquids until 4:30 AM DAY OF SURGERY  Water Non-Citrus Juices (without pulp, NO RED) Carbonated Beverages Black Coffee (NO MILK/CREAM OR CREAMERS, sugar ok)  Clear Tea (NO MILK/CREAM OR CREAMERS, sugar ok) regular and decaf                             Plain Jell-O (NO RED)                                           Fruit ices (not with fruit pulp, NO RED)                                     Popsicles (NO RED)                                                               Sports drinks like Gatorade (NO RED)          If you have questions, please contact your surgeon's office.   FOLLOW ANY ADDITIONAL PRE OP INSTRUCTIONS YOU RECEIVED FROM YOUR SURGEON'S OFFICE!!!     Oral Hygiene is also important to reduce your risk of infection.                                    Remember - BRUSH YOUR TEETH THE MORNING OF SURGERY WITH YOUR REGULAR TOOTHPASTE   Take these medicines the  morning of surgery with A SIP OF WATER:  Tramadol if needed  DO NOT TAKE ANY ORAL DIABETIC MEDICATIONS DAY OF YOUR SURGERY  How to Manage Your Diabetes Before and After Surgery  Why is it important to control my blood sugar before and after surgery? Improving blood sugar levels before and after surgery helps healing and can limit problems. A way of improving blood sugar control is eating a healthy diet by:  Eating less sugar  and carbohydrates  Increasing activity/exercise  Talking with your doctor about reaching your blood sugar goals High blood sugars (greater than 180 mg/dL) can raise your risk of infections and slow your recovery, so you will need to focus on controlling your diabetes during the weeks before surgery. Make sure that the doctor who takes care of your diabetes knows about your planned surgery including the date and location.  How do I manage my blood sugar before surgery? Check your blood sugar at least 4 times a day, starting 2 days before surgery, to make sure that the level is not too high or low. Check your blood sugar the morning of your surgery when you wake up and every 2 hours until you get to the Short Stay unit. If your blood sugar is less than 70 mg/dL, you will need to treat for low blood sugar: Do not take insulin. Treat a low blood sugar (less than 70 mg/dL) with  cup of clear juice (cranberry or apple), 4 glucose tablets, OR glucose gel. Recheck blood sugar in 15 minutes after treatment (to make sure it is greater than 70 mg/dL). If your blood sugar is not greater than 70 mg/dL on recheck, call [phone] for further instructions. Report your blood sugar to the short stay nurse when you get to Short Stay.  If you are admitted to the hospital after surgery: Your blood sugar will be checked by the staff and you will probably be given insulin after surgery (instead of oral diabetes medicines) to make sure you have good blood sugar levels. The goal for blood  sugar control after surgery is 80-180 mg/dL.  Reviewed and Endorsed by [facility] Patient Education Committee, August 2015                           You may not have any metal on your body including hair pins, jewelry, and body piercing             Do not wear make-up, lotions, powders, perfumes or deodorant  Do not wear nail polish including gel and S&S, artificial/acrylic nails, or any other type of covering on natural nails including finger and toenails. If you have artificial nails, gel coating, etc. that needs to be removed by a nail salon please have this removed prior to surgery or surgery may need to be canceled/ delayed if the surgeon/ anesthesia feels like they are unable to be safely monitored.   Do not shave  48 hours prior to surgery.             Do not bring valuables to the hospital. [place].   Contacts, dentures or bridgework may not be worn into surgery.  DO NOT [place]. PHARMACY WILL DISPENSE MEDICATIONS LISTED ON YOUR MEDICATION LIST TO YOU DURING YOUR ADMISSION [place]!    Patients discharged on the day of surgery will not be allowed to drive home.  Someone NEEDS to stay with you for the first 24 hours after anesthesia.              Please read over the following fact sheets you were given: IF [place] Apolonio Schneiders  If you received a COVID test during your pre-op visit  it is requested that you wear a mask when out in public, stay away from anyone that may not  be feeling well and notify your surgeon if you develop symptoms. If you test positive for Covid or have been in contact with anyone that has tested positive in the last 10 days please notify you surgeon.  [place] - Preparing for Surgery Before surgery, you can play an important role.  Because skin is not sterile, your skin needs to be as free of germs as possible.  You  can reduce the number of germs on your skin by washing with CHG (chlorahexidine gluconate) soap before surgery.  CHG is an antiseptic cleaner which kills germs and bonds with the skin to continue killing germs even after washing. Please DO NOT use if you have an allergy to CHG or antibacterial soaps.  If your skin becomes reddened/irritated stop using the CHG and inform your nurse when you arrive at Short Stay. Do not shave (including legs and underarms) for at least 48 hours prior to the first CHG shower.  You may shave your face/neck.  Please follow these instructions carefully:  1.  Shower with CHG Soap the night before surgery and the  morning of surgery.  2.  If you choose to wash your hair, wash your hair first as usual with your normal  shampoo.  3.  After you shampoo, rinse your hair and body thoroughly to remove the shampoo.                             4.  Use CHG as you would any other liquid soap.  You can apply chg directly to the skin and wash.  Gently with a scrungie or clean washcloth.  5.  Apply the CHG Soap to your body ONLY FROM THE NECK DOWN.   Do   not use on face/ open                           Wound or open sores. Avoid contact with eyes, ears mouth and   genitals (private parts).                       Wash face,  Genitals (private parts) with your normal soap.             6.  Wash thoroughly, paying special attention to the area where your    surgery  will be performed.  7.  Thoroughly rinse your body with warm water from the neck down.  8.  DO NOT shower/wash with your normal soap after using and rinsing off the CHG Soap.                9.  Pat yourself dry with a clean towel.            10.  Wear clean pajamas.            11.  Place clean sheets on your bed the night of your first shower and do not  sleep with pets. Day of Surgery : Do not apply any lotions/deodorants the morning of surgery.  Please wear clean clothes to the hospital/surgery center.  FAILURE TO FOLLOW  THESE INSTRUCTIONS MAY RESULT IN THE CANCELLATION OF YOUR SURGERY  PATIENT SIGNATURE_________________________________  NURSE SIGNATURE__________________________________  ________________________________________________________________________    WHAT IS A BLOOD TRANSFUSION? Blood Transfusion Information  A transfusion is the replacement of blood or some of its parts. Blood is made up of multiple cells which provide  different functions. Red blood cells carry oxygen and are used for blood loss replacement. White blood cells fight against infection. Platelets control bleeding. Plasma helps clot blood. Other blood products are available for specialized needs, such as hemophilia or other clotting disorders. BEFORE THE TRANSFUSION  Who gives blood for transfusions?  Healthy volunteers who are fully evaluated to make sure their blood is safe. This is blood bank blood. Transfusion therapy is the safest it has ever been in the practice of medicine. Before blood is taken from a donor, a complete history is taken to make sure that person has no history of diseases nor engages in risky social behavior (examples are intravenous drug use or sexual activity with multiple partners). The donor's travel history is screened to minimize risk of transmitting infections, such as malaria. The donated blood is tested for signs of infectious diseases, such as HIV and hepatitis. The blood is then tested to be sure it is compatible with you in order to minimize the chance of a transfusion reaction. If you or a relative donates blood, this is often done in anticipation of surgery and is not appropriate for emergency situations. It takes many days to process the donated blood. RISKS AND COMPLICATIONS Although transfusion therapy is very safe and saves many lives, the main dangers of transfusion include:  Getting an infectious disease. Developing a transfusion reaction. This is an allergic reaction to something in the  blood you were given. Every precaution is taken to prevent this. The decision to have a blood transfusion has been considered carefully by your caregiver before blood is given. Blood is not given unless the benefits outweigh the risks. AFTER THE TRANSFUSION Right after receiving a blood transfusion, you will usually feel much better and more energetic. This is especially true if your red blood cells have gotten low (anemic). The transfusion raises the level of the red blood cells which carry oxygen, and this usually causes an energy increase. The nurse administering the transfusion will monitor you carefully for complications. HOME CARE INSTRUCTIONS  No special instructions are needed after a transfusion. You may find your energy is better. Speak with your caregiver about any limitations on activity for underlying diseases you may have. SEEK MEDICAL CARE IF:  Your condition is not improving after your transfusion. You develop redness or irritation at the intravenous (IV) site. SEEK IMMEDIATE MEDICAL CARE IF:  Any of the following symptoms occur over the next 12 hours: Shaking chills. You have a temperature by mouth above 102 F (38.9 C), not controlled by medicine. Chest, back, or muscle pain. People around you feel you are not acting correctly or are confused. Shortness of breath or difficulty breathing. Dizziness and fainting. You get a rash or develop hives. You have a decrease in urine output. Your urine turns a dark color or changes to pink, red, or brown. Any of the following symptoms occur over the next 10 days: You have a temperature by mouth above 102 F (38.9 C), not controlled by medicine. Shortness of breath. Weakness after normal activity. The white part of the eye turns yellow (jaundice). You have a decrease in the amount of urine or are urinating less often. Your urine turns a dark color or changes to pink, red, or brown. Document Released: 02/14/2000 Document Revised:  05/11/2011 Document Reviewed: 10/03/2007 [facility] Patient Information 2014 [place], [place].  _______________________________________________________________________

## 2021-12-17 ENCOUNTER — Encounter (HOSPITAL_COMMUNITY): Payer: Self-pay

## 2021-12-17 ENCOUNTER — Encounter (HOSPITAL_COMMUNITY)
Admission: RE | Admit: 2021-12-17 | Discharge: 2021-12-17 | Disposition: A | Discharge status: Home / Self Care | Payer: Medicare PPO | Source: Ambulatory Visit | Attending: Gynecologic Oncology | Admitting: Gynecologic Oncology

## 2021-12-17 ENCOUNTER — Encounter: Payer: Self-pay | Admitting: Family

## 2021-12-17 VITALS — BP 146/86 | HR 89 | Temp 97.6°F | Resp 12 | Ht 71.0 in | Wt 125.0 lb

## 2021-12-17 DIAGNOSIS — E119 Type 2 diabetes mellitus without complications: Secondary | ICD-10-CM

## 2021-12-17 DIAGNOSIS — C549 Malignant neoplasm of corpus uteri, unspecified: Secondary | ICD-10-CM | POA: Insufficient documentation

## 2021-12-17 DIAGNOSIS — Z01812 Encounter for preprocedural laboratory examination: Secondary | ICD-10-CM | POA: Insufficient documentation

## 2021-12-17 HISTORY — DX: Cardiac murmur, unspecified: R01.1

## 2021-12-17 LAB — HEMOGLOBIN A1C
Hgb A1c MFr Bld: 5.7 % — ABNORMAL HIGH (ref 4.8–5.6)
Mean Plasma Glucose: 116.89 mg/dL

## 2021-12-17 LAB — GLUCOSE, CAPILLARY: Glucose-Capillary: 93 mg/dL (ref 70–99)

## 2021-12-17 NOTE — Progress Notes (Signed)
COVID Vaccine Completed: yes  Date of COVID positive in last 90 days: no  PCP - Shanon Ace, MD Cardiologist - Minus Breeding, MD seen [phone] for heart murmur, has not needed follow up per pt  Chest x-ray - CT 12/05/21 Epic EKG - 11/18/21 Epic Stress Test - n/a ECHO - 2021 Epic Cardiac Cath - n/a Pacemaker/ICD device last checked: n/a Spinal Cord Stimulator: n/a  Bowel Prep - light diet day before  Sleep Study - n/a CPAP -   Fasting Blood Sugar - diet controlled  Checks Blood Sugar  monthly at home  Blood Thinner Instructions: Agrylin, hold 5 days Aspirin Instructions: ASA 81, no instructions pt will call Last Dose: 12/18/21 1800  Activity level: Can go up a flight of stairs and perform activities of daily living without stopping and without symptoms of chest pain or shortness of breath.   Anesthesia review: Hgb 8  Patient denies shortness of breath, fever, cough and chest pain at PAT appointment  Patient verbalized understanding of instructions that were given to them at the PAT appointment. Patient was also instructed that they will need to review over the PAT instructions again at home before surgery.

## 2021-12-18 ENCOUNTER — Inpatient Hospital Stay: Payer: Medicare PPO

## 2021-12-18 VITALS — BP 155/76 | HR 76 | Temp 97.8°F | Resp 18

## 2021-12-18 DIAGNOSIS — C541 Malignant neoplasm of endometrium: Secondary | ICD-10-CM

## 2021-12-18 DIAGNOSIS — D473 Essential (hemorrhagic) thrombocythemia: Secondary | ICD-10-CM | POA: Diagnosis not present

## 2021-12-18 DIAGNOSIS — C55 Malignant neoplasm of uterus, part unspecified: Secondary | ICD-10-CM | POA: Diagnosis not present

## 2021-12-18 DIAGNOSIS — D508 Other iron deficiency anemias: Secondary | ICD-10-CM

## 2021-12-18 DIAGNOSIS — Z7982 Long term (current) use of aspirin: Secondary | ICD-10-CM | POA: Diagnosis not present

## 2021-12-18 DIAGNOSIS — C549 Malignant neoplasm of corpus uteri, unspecified: Secondary | ICD-10-CM

## 2021-12-18 DIAGNOSIS — D631 Anemia in chronic kidney disease: Secondary | ICD-10-CM

## 2021-12-18 DIAGNOSIS — D500 Iron deficiency anemia secondary to blood loss (chronic): Secondary | ICD-10-CM

## 2021-12-18 DIAGNOSIS — D638 Anemia in other chronic diseases classified elsewhere: Secondary | ICD-10-CM | POA: Diagnosis not present

## 2021-12-18 DIAGNOSIS — Z87442 Personal history of urinary calculi: Secondary | ICD-10-CM | POA: Diagnosis not present

## 2021-12-18 DIAGNOSIS — Z79899 Other long term (current) drug therapy: Secondary | ICD-10-CM | POA: Diagnosis not present

## 2021-12-18 DIAGNOSIS — E119 Type 2 diabetes mellitus without complications: Secondary | ICD-10-CM | POA: Diagnosis not present

## 2021-12-18 DIAGNOSIS — D471 Chronic myeloproliferative disease: Secondary | ICD-10-CM

## 2021-12-18 LAB — CBC WITH DIFFERENTIAL (CANCER CENTER ONLY)
Abs Immature Granulocytes: 0.08 10*3/uL — ABNORMAL HIGH (ref 0.00–0.07)
Basophils Absolute: 0.1 10*3/uL (ref 0.0–0.1)
Basophils Relative: 1 %
Eosinophils Absolute: 0.1 10*3/uL (ref 0.0–0.5)
Eosinophils Relative: 2 %
HCT: 26.7 % — ABNORMAL LOW (ref 36.0–46.0)
Hemoglobin: 8.2 g/dL — ABNORMAL LOW (ref 12.0–15.0)
Immature Granulocytes: 1 %
Lymphocytes Relative: 18 %
Lymphs Abs: 1.5 10*3/uL (ref 0.7–4.0)
MCH: 25.1 pg — ABNORMAL LOW (ref 26.0–34.0)
MCHC: 30.7 g/dL (ref 30.0–36.0)
MCV: 81.7 fL (ref 80.0–100.0)
Monocytes Absolute: 0.9 10*3/uL (ref 0.1–1.0)
Monocytes Relative: 11 %
Neutro Abs: 5.5 10*3/uL (ref 1.7–7.7)
Neutrophils Relative %: 67 %
Platelet Count: 340 10*3/uL (ref 150–400)
RBC: 3.27 MIL/uL — ABNORMAL LOW (ref 3.87–5.11)
RDW: 19.6 % — ABNORMAL HIGH (ref 11.5–15.5)
WBC Count: 8.1 10*3/uL (ref 4.0–10.5)
nRBC: 0.4 % — ABNORMAL HIGH (ref 0.0–0.2)

## 2021-12-18 MED ORDER — EPOETIN ALFA-EPBX 40000 UNIT/ML IJ SOLN
40000.0000 [IU] | Freq: Once | INTRAMUSCULAR | Status: AC
  Administered 2021-12-18: 40000 [IU] via SUBCUTANEOUS
  Filled 2021-12-18: qty 1

## 2021-12-18 NOTE — Progress Notes (Signed)
Patient states she had swelling in her wrist and fingers after receiving her retacrit injection on Monday. No redness, swelling noted today. Dr. Marin Olp notified. Ok to proceed with injection today as planned. Patient verbalized understanding and will proceed with injection today. Patient wants to have injection given in left arm again today.

## 2021-12-22 ENCOUNTER — Inpatient Hospital Stay: Payer: Medicare PPO

## 2021-12-22 VITALS — BP 136/75 | HR 80 | Temp 97.8°F

## 2021-12-22 DIAGNOSIS — D631 Anemia in chronic kidney disease: Secondary | ICD-10-CM

## 2021-12-22 DIAGNOSIS — C541 Malignant neoplasm of endometrium: Secondary | ICD-10-CM

## 2021-12-22 DIAGNOSIS — E119 Type 2 diabetes mellitus without complications: Secondary | ICD-10-CM | POA: Diagnosis present

## 2021-12-22 DIAGNOSIS — N711 Chronic inflammatory disease of uterus: Secondary | ICD-10-CM | POA: Diagnosis not present

## 2021-12-22 DIAGNOSIS — C549 Malignant neoplasm of corpus uteri, unspecified: Secondary | ICD-10-CM

## 2021-12-22 DIAGNOSIS — Z7982 Long term (current) use of aspirin: Secondary | ICD-10-CM | POA: Diagnosis not present

## 2021-12-22 DIAGNOSIS — D251 Intramural leiomyoma of uterus: Secondary | ICD-10-CM | POA: Diagnosis not present

## 2021-12-22 DIAGNOSIS — D500 Iron deficiency anemia secondary to blood loss (chronic): Secondary | ICD-10-CM

## 2021-12-22 DIAGNOSIS — R188 Other ascites: Secondary | ICD-10-CM | POA: Diagnosis present

## 2021-12-22 DIAGNOSIS — I898 Other specified noninfective disorders of lymphatic vessels and lymph nodes: Secondary | ICD-10-CM | POA: Diagnosis not present

## 2021-12-22 DIAGNOSIS — Z833 Family history of diabetes mellitus: Secondary | ICD-10-CM | POA: Diagnosis not present

## 2021-12-22 DIAGNOSIS — Z8249 Family history of ischemic heart disease and other diseases of the circulatory system: Secondary | ICD-10-CM | POA: Diagnosis not present

## 2021-12-22 DIAGNOSIS — D508 Other iron deficiency anemias: Secondary | ICD-10-CM

## 2021-12-22 DIAGNOSIS — Z79899 Other long term (current) drug therapy: Secondary | ICD-10-CM | POA: Diagnosis not present

## 2021-12-22 DIAGNOSIS — N9489 Other specified conditions associated with female genital organs and menstrual cycle: Secondary | ICD-10-CM | POA: Diagnosis not present

## 2021-12-22 DIAGNOSIS — D471 Chronic myeloproliferative disease: Secondary | ICD-10-CM

## 2021-12-22 DIAGNOSIS — Z823 Family history of stroke: Secondary | ICD-10-CM | POA: Diagnosis not present

## 2021-12-22 DIAGNOSIS — D259 Leiomyoma of uterus, unspecified: Secondary | ICD-10-CM | POA: Diagnosis present

## 2021-12-22 DIAGNOSIS — D473 Essential (hemorrhagic) thrombocythemia: Secondary | ICD-10-CM

## 2021-12-22 DIAGNOSIS — K660 Peritoneal adhesions (postprocedural) (postinfection): Secondary | ICD-10-CM | POA: Diagnosis not present

## 2021-12-22 DIAGNOSIS — N736 Female pelvic peritoneal adhesions (postinfective): Secondary | ICD-10-CM | POA: Diagnosis present

## 2021-12-22 DIAGNOSIS — N8003 Adenomyosis of the uterus: Secondary | ICD-10-CM | POA: Diagnosis not present

## 2021-12-22 DIAGNOSIS — N879 Dysplasia of cervix uteri, unspecified: Secondary | ICD-10-CM | POA: Diagnosis not present

## 2021-12-22 DIAGNOSIS — N841 Polyp of cervix uteri: Secondary | ICD-10-CM | POA: Diagnosis not present

## 2021-12-22 DIAGNOSIS — K668 Other specified disorders of peritoneum: Secondary | ICD-10-CM | POA: Diagnosis not present

## 2021-12-22 DIAGNOSIS — I10 Essential (primary) hypertension: Secondary | ICD-10-CM | POA: Diagnosis present

## 2021-12-22 LAB — CBC WITH DIFFERENTIAL (CANCER CENTER ONLY)
Abs Immature Granulocytes: 0.1 10*3/uL — ABNORMAL HIGH (ref 0.00–0.07)
Basophils Absolute: 0.1 10*3/uL (ref 0.0–0.1)
Basophils Relative: 1 %
Eosinophils Absolute: 0.2 10*3/uL (ref 0.0–0.5)
Eosinophils Relative: 2 %
HCT: 28 % — ABNORMAL LOW (ref 36.0–46.0)
Hemoglobin: 8.5 g/dL — ABNORMAL LOW (ref 12.0–15.0)
Immature Granulocytes: 1 %
Lymphocytes Relative: 21 %
Lymphs Abs: 1.8 10*3/uL (ref 0.7–4.0)
MCH: 25.4 pg — ABNORMAL LOW (ref 26.0–34.0)
MCHC: 30.4 g/dL (ref 30.0–36.0)
MCV: 83.8 fL (ref 80.0–100.0)
Monocytes Absolute: 1.1 10*3/uL — ABNORMAL HIGH (ref 0.1–1.0)
Monocytes Relative: 13 %
Neutro Abs: 5.2 10*3/uL (ref 1.7–7.7)
Neutrophils Relative %: 62 %
Platelet Count: 501 10*3/uL — ABNORMAL HIGH (ref 150–400)
RBC: 3.34 MIL/uL — ABNORMAL LOW (ref 3.87–5.11)
RDW: 20.6 % — ABNORMAL HIGH (ref 11.5–15.5)
WBC Count: 8.4 10*3/uL (ref 4.0–10.5)
nRBC: 0.7 % — ABNORMAL HIGH (ref 0.0–0.2)

## 2021-12-22 MED ORDER — EPOETIN ALFA-EPBX 40000 UNIT/ML IJ SOLN
40000.0000 [IU] | Freq: Once | INTRAMUSCULAR | Status: AC
  Administered 2021-12-22: 40000 [IU] via SUBCUTANEOUS
  Filled 2021-12-22: qty 1

## 2021-12-22 NOTE — Progress Notes (Signed)
Retacrit authorized per Ross Ludwig

## 2021-12-22 NOTE — Patient Instructions (Signed)

## 2021-12-23 ENCOUNTER — Telehealth: Payer: Self-pay | Admitting: Surgery

## 2021-12-23 NOTE — Telephone Encounter (Signed)
Telephone call to check on pre-operative status.  Patient compliant with pre-operative instructions.  Reinforced nothing to eat after midnight. Clear liquids until 4am. Patient to arrive at 5am.  No questions or concerns voiced.  Instructed to call for any needs.

## 2021-12-24 ENCOUNTER — Inpatient Hospital Stay (HOSPITAL_COMMUNITY): Payer: Medicare PPO | Admitting: Physician Assistant

## 2021-12-24 ENCOUNTER — Other Ambulatory Visit: Payer: Self-pay

## 2021-12-24 ENCOUNTER — Encounter (HOSPITAL_COMMUNITY)
Admission: RE | Disposition: A | Discharge status: Home / Self Care | Payer: Self-pay | Source: Home / Self Care | Attending: Gynecologic Oncology

## 2021-12-24 ENCOUNTER — Encounter (HOSPITAL_COMMUNITY): Payer: Self-pay | Admitting: Gynecologic Oncology

## 2021-12-24 ENCOUNTER — Inpatient Hospital Stay (HOSPITAL_COMMUNITY)
Admission: RE | Admit: 2021-12-24 | Discharge: 2021-12-24 | DRG: 741 | Disposition: A | Discharge status: Home / Self Care | Payer: Medicare PPO | Attending: Gynecologic Oncology | Admitting: Gynecologic Oncology

## 2021-12-24 DIAGNOSIS — Z823 Family history of stroke: Secondary | ICD-10-CM

## 2021-12-24 DIAGNOSIS — C549 Malignant neoplasm of corpus uteri, unspecified: Secondary | ICD-10-CM

## 2021-12-24 DIAGNOSIS — I10 Essential (primary) hypertension: Secondary | ICD-10-CM

## 2021-12-24 DIAGNOSIS — E119 Type 2 diabetes mellitus without complications: Secondary | ICD-10-CM | POA: Diagnosis present

## 2021-12-24 DIAGNOSIS — D259 Leiomyoma of uterus, unspecified: Secondary | ICD-10-CM | POA: Diagnosis present

## 2021-12-24 DIAGNOSIS — Z7982 Long term (current) use of aspirin: Secondary | ICD-10-CM

## 2021-12-24 DIAGNOSIS — R188 Other ascites: Secondary | ICD-10-CM | POA: Diagnosis present

## 2021-12-24 DIAGNOSIS — Z833 Family history of diabetes mellitus: Secondary | ICD-10-CM

## 2021-12-24 DIAGNOSIS — Z8249 Family history of ischemic heart disease and other diseases of the circulatory system: Secondary | ICD-10-CM

## 2021-12-24 DIAGNOSIS — N736 Female pelvic peritoneal adhesions (postinfective): Secondary | ICD-10-CM

## 2021-12-24 DIAGNOSIS — C541 Malignant neoplasm of endometrium: Principal | ICD-10-CM | POA: Diagnosis present

## 2021-12-24 DIAGNOSIS — Z79899 Other long term (current) drug therapy: Secondary | ICD-10-CM | POA: Diagnosis not present

## 2021-12-24 DIAGNOSIS — C55 Malignant neoplasm of uterus, part unspecified: Secondary | ICD-10-CM | POA: Diagnosis present

## 2021-12-24 DIAGNOSIS — D251 Intramural leiomyoma of uterus: Secondary | ICD-10-CM | POA: Diagnosis not present

## 2021-12-24 HISTORY — PX: SENTINEL NODE BIOPSY: SHX6608

## 2021-12-24 HISTORY — PX: ROBOTIC ASSISTED LAPAROSCOPIC LYSIS OF ADHESION: SHX6080

## 2021-12-24 LAB — TYPE AND SCREEN
ABO/RH(D): B POS
Antibody Screen: NEGATIVE

## 2021-12-24 LAB — ABO/RH: ABO/RH(D): B POS

## 2021-12-24 LAB — GLUCOSE, CAPILLARY
Glucose-Capillary: 90 mg/dL (ref 70–99)
Glucose-Capillary: 123 mg/dL — ABNORMAL HIGH (ref 70–99)

## 2021-12-24 SURGERY — XI ROBOTIC ASSISTED TOTAL HYSTERECTOMY BILATERAL SALPINGO OOPHORECTOMY WITH OMENTECTOMY AND DEBULKING
Anesthesia: General

## 2021-12-24 MED ORDER — ORAL CARE MOUTH RINSE: 15.0000 mL | Freq: Once | OROMUCOSAL | Status: AC

## 2021-12-24 MED ORDER — LIDOCAINE 2% (20 MG/ML) 5 ML SYRINGE
INTRAMUSCULAR | Status: DC | PRN
  Administered 2021-12-24: 40 mg via INTRAVENOUS
  Administered 2021-12-24: 1.5 mg/kg/h via INTRAVENOUS

## 2021-12-24 MED ORDER — DEXAMETHASONE SODIUM PHOSPHATE 10 MG/ML IJ SOLN
INTRAMUSCULAR | Status: DC | PRN
  Administered 2021-12-24: 10 mg via INTRAVENOUS

## 2021-12-24 MED ORDER — SUGAMMADEX SODIUM 200 MG/2ML IV SOLN
INTRAVENOUS | Status: DC | PRN
  Administered 2021-12-24: 150 mg via INTRAVENOUS

## 2021-12-24 MED ORDER — BUPIVACAINE-EPINEPHRINE (PF) 0.25% -1:200000 IJ SOLN
INTRAMUSCULAR | Status: AC
  Filled 2021-12-24: qty 60

## 2021-12-24 MED ORDER — ONDANSETRON HCL 4 MG PO TABS: 4.0000 mg | ORAL_TABLET | Freq: Four times a day (QID) | ORAL | Status: DC | PRN

## 2021-12-24 MED ORDER — PHENYLEPHRINE 80 MCG/ML (10ML) SYRINGE FOR IV PUSH (FOR BLOOD PRESSURE SUPPORT)
PREFILLED_SYRINGE | INTRAVENOUS | Status: DC | PRN
  Administered 2021-12-24: 120 ug via INTRAVENOUS

## 2021-12-24 MED ORDER — HEMOSTATIC AGENTS (NO CHARGE) OPTIME
TOPICAL | Status: DC | PRN
  Administered 2021-12-24: 1 via TOPICAL

## 2021-12-24 MED ORDER — FENTANYL CITRATE (PF) 100 MCG/2ML IJ SOLN
INTRAMUSCULAR | Status: DC | PRN
  Administered 2021-12-24: 50 ug via INTRAVENOUS
  Administered 2021-12-24: 100 ug via INTRAVENOUS

## 2021-12-24 MED ORDER — HEPARIN SODIUM (PORCINE) 5000 UNIT/ML IJ SOLN
5000.0000 [IU] | INTRAMUSCULAR | Status: AC
  Administered 2021-12-24: 5000 [IU] via SUBCUTANEOUS
  Filled 2021-12-24: qty 1

## 2021-12-24 MED ORDER — STERILE WATER FOR IRRIGATION IR SOLN
Status: DC | PRN
  Administered 2021-12-24: 1000 mL

## 2021-12-24 MED ORDER — LACTATED RINGERS IR SOLN
Status: DC | PRN
  Administered 2021-12-24: 1000 mL

## 2021-12-24 MED ORDER — DEXAMETHASONE SODIUM PHOSPHATE 4 MG/ML IJ SOLN: 4.0000 mg | INTRAMUSCULAR | Status: DC

## 2021-12-24 MED ORDER — STERILE WATER FOR INJECTION IJ SOLN
INTRAMUSCULAR | Status: DC | PRN
  Administered 2021-12-24: 1 mL via INTRAVENOUS

## 2021-12-24 MED ORDER — HYDROMORPHONE HCL 1 MG/ML IJ SOLN
0.2500 mg | INTRAMUSCULAR | Status: DC | PRN
  Administered 2021-12-24 (×3): 0.5 mg via INTRAVENOUS

## 2021-12-24 MED ORDER — SODIUM CHLORIDE (PF) 0.9 % IJ SOLN
INTRAMUSCULAR | Status: AC
  Filled 2021-12-24: qty 10

## 2021-12-24 MED ORDER — STERILE WATER FOR INJECTION IJ SOLN
INTRAMUSCULAR | Status: AC
  Filled 2021-12-24: qty 10

## 2021-12-24 MED ORDER — ONDANSETRON HCL 4 MG/2ML IJ SOLN
INTRAMUSCULAR | Status: DC | PRN
  Administered 2021-12-24: 4 mg via INTRAVENOUS

## 2021-12-24 MED ORDER — PROPOFOL 10 MG/ML IV BOLUS
INTRAVENOUS | Status: DC | PRN
  Administered 2021-12-24: 120 mg via INTRAVENOUS

## 2021-12-24 MED ORDER — PHENYLEPHRINE 80 MCG/ML (10ML) SYRINGE FOR IV PUSH (FOR BLOOD PRESSURE SUPPORT)
PREFILLED_SYRINGE | INTRAVENOUS | Status: AC
  Filled 2021-12-24: qty 10

## 2021-12-24 MED ORDER — STERILE WATER FOR INJECTION IJ SOLN
INTRAMUSCULAR | Status: DC | PRN
  Administered 2021-12-24: 10 mL

## 2021-12-24 MED ORDER — LACTATED RINGERS IV SOLN: INTRAVENOUS | Status: DC

## 2021-12-24 MED ORDER — KETAMINE HCL 10 MG/ML IJ SOLN
INTRAMUSCULAR | Status: DC | PRN
  Administered 2021-12-24: 30 mg via INTRAVENOUS

## 2021-12-24 MED ORDER — CHLORHEXIDINE GLUCONATE 0.12 % MT SOLN
15.0000 mL | Freq: Once | OROMUCOSAL | Status: AC
  Administered 2021-12-24: 15 mL via OROMUCOSAL

## 2021-12-24 MED ORDER — ACETAMINOPHEN 500 MG PO TABS
1000.0000 mg | ORAL_TABLET | ORAL | Status: AC
  Administered 2021-12-24: 1000 mg via ORAL
  Filled 2021-12-24: qty 2

## 2021-12-24 MED ORDER — ONDANSETRON HCL 4 MG/2ML IJ SOLN
INTRAMUSCULAR | Status: AC
  Filled 2021-12-24: qty 2

## 2021-12-24 MED ORDER — FENTANYL CITRATE (PF) 250 MCG/5ML IJ SOLN
INTRAMUSCULAR | Status: AC
  Filled 2021-12-24: qty 5

## 2021-12-24 MED ORDER — ROCURONIUM BROMIDE 10 MG/ML (PF) SYRINGE
PREFILLED_SYRINGE | INTRAVENOUS | Status: DC | PRN
  Administered 2021-12-24: 20 mg via INTRAVENOUS
  Administered 2021-12-24: 60 mg via INTRAVENOUS

## 2021-12-24 MED ORDER — BUPIVACAINE HCL 0.25 % IJ SOLN
INTRAMUSCULAR | Status: DC | PRN
  Administered 2021-12-24: 28 mL

## 2021-12-24 MED ORDER — KETAMINE HCL 50 MG/5ML IJ SOSY
PREFILLED_SYRINGE | INTRAMUSCULAR | Status: AC
  Filled 2021-12-24: qty 5

## 2021-12-24 MED ORDER — HYDROMORPHONE HCL 1 MG/ML IJ SOLN
INTRAMUSCULAR | Status: AC
  Filled 2021-12-24: qty 1

## 2021-12-24 MED ORDER — CEFAZOLIN SODIUM-DEXTROSE 2-4 GM/100ML-% IV SOLN
2.0000 g | INTRAVENOUS | Status: AC
  Administered 2021-12-24: 2 g via INTRAVENOUS
  Filled 2021-12-24: qty 100

## 2021-12-24 MED ORDER — BUPIVACAINE HCL 0.25 % IJ SOLN
INTRAMUSCULAR | Status: AC
  Filled 2021-12-24: qty 1

## 2021-12-24 MED ORDER — TRAMADOL HCL 50 MG PO TABS: 50.0000 mg | ORAL_TABLET | Freq: Four times a day (QID) | ORAL | Status: DC | PRN

## 2021-12-24 MED ORDER — PROPOFOL 10 MG/ML IV BOLUS
INTRAVENOUS | Status: AC
  Filled 2021-12-24: qty 20

## 2021-12-24 MED ORDER — OXYCODONE HCL 5 MG PO TABS: 5.0000 mg | ORAL_TABLET | ORAL | Status: DC | PRN

## 2021-12-24 MED ORDER — ONDANSETRON HCL 4 MG/2ML IJ SOLN: 4.0000 mg | Freq: Four times a day (QID) | INTRAMUSCULAR | Status: DC | PRN

## 2021-12-24 SURGICAL SUPPLY — 80 items
ADH SKN CLS APL DERMABOND .7 (GAUZE/BANDAGES/DRESSINGS) ×2
AGENT HMST KT MTR STRL THRMB (HEMOSTASIS)
APL ESCP 34 STRL LF DISP (HEMOSTASIS)
APL SRG 38 LTWT LNG FL B (MISCELLANEOUS) ×2
APPLICATOR ARISTA FLEXITIP XL (MISCELLANEOUS) IMPLANT
APPLICATOR SURGIFLO ENDO (HEMOSTASIS) IMPLANT
BAG LAPAROSCOPIC 12 15 PORT 16 (BASKET) IMPLANT
BAG RETRIEVAL 12/15 (BASKET) ×2
BLADE SURG SZ10 CARB STEEL (BLADE) IMPLANT
COVER BACK TABLE 60X90IN (DRAPES) ×2 IMPLANT
COVER TIP SHEARS 8 DVNC (MISCELLANEOUS) ×2 IMPLANT
COVER TIP SHEARS 8MM DA VINCI (MISCELLANEOUS) ×2
DERMABOND ADVANCED .7 DNX12 (GAUZE/BANDAGES/DRESSINGS) ×2 IMPLANT
DRAPE ARM DVNC X/XI (DISPOSABLE) ×8 IMPLANT
DRAPE COLUMN DVNC XI (DISPOSABLE) ×2 IMPLANT
DRAPE DA VINCI XI ARM (DISPOSABLE) ×8
DRAPE DA VINCI XI COLUMN (DISPOSABLE) ×2
DRAPE SHEET LG 3/4 BI-LAMINATE (DRAPES) ×2 IMPLANT
DRAPE SURG IRRIG POUCH 19X23 (DRAPES) ×2 IMPLANT
DRSG OPSITE POSTOP 4X6 (GAUZE/BANDAGES/DRESSINGS) IMPLANT
DRSG OPSITE POSTOP 4X8 (GAUZE/BANDAGES/DRESSINGS) IMPLANT
ELECT PENCIL ROCKER SW 15FT (MISCELLANEOUS) IMPLANT
ELECT REM PT RETURN 15FT ADLT (MISCELLANEOUS) ×2 IMPLANT
GAUZE 4X4 16PLY ~~LOC~~+RFID DBL (SPONGE) ×4 IMPLANT
GLOVE BIO SURGEON STRL SZ 6 (GLOVE) ×8 IMPLANT
GLOVE BIO SURGEON STRL SZ 6.5 (GLOVE) ×2 IMPLANT
GOWN STRL REUS W/ TWL LRG LVL3 (GOWN DISPOSABLE) ×8 IMPLANT
GOWN STRL REUS W/TWL LRG LVL3 (GOWN DISPOSABLE) ×8
GRASPER SUT TROCAR 14GX15 (MISCELLANEOUS) IMPLANT
HEMOSTAT ARISTA ABSORB 3G PWDR (HEMOSTASIS) IMPLANT
HOLDER FOLEY CATH W/STRAP (MISCELLANEOUS) IMPLANT
IRRIG SUCT STRYKERFLOW 2 WTIP (MISCELLANEOUS) ×2
IRRIGATION SUCT STRKRFLW 2 WTP (MISCELLANEOUS) ×2 IMPLANT
KIT PROCEDURE DA VINCI SI (MISCELLANEOUS)
KIT PROCEDURE DVNC SI (MISCELLANEOUS) IMPLANT
KIT TURNOVER KIT A (KITS) IMPLANT
LIGASURE IMPACT 36 18CM CVD LR (INSTRUMENTS) IMPLANT
MANIPULATOR ADVINCU DEL 3.0 PL (MISCELLANEOUS) IMPLANT
MANIPULATOR ADVINCU DEL 3.5 PL (MISCELLANEOUS) IMPLANT
MANIPULATOR UTERINE 4.5 ZUMI (MISCELLANEOUS) IMPLANT
NDL HYPO 21X1.5 SAFETY (NEEDLE) ×2 IMPLANT
NDL SPNL 18GX3.5 QUINCKE PK (NEEDLE) IMPLANT
NEEDLE HYPO 21X1.5 SAFETY (NEEDLE) ×2 IMPLANT
NEEDLE SPNL 18GX3.5 QUINCKE PK (NEEDLE) IMPLANT
OBTURATOR OPTICAL STANDARD 8MM (TROCAR) ×2
OBTURATOR OPTICAL STND 8 DVNC (TROCAR) ×2
OBTURATOR OPTICALSTD 8 DVNC (TROCAR) ×2 IMPLANT
PACK ROBOT GYN CUSTOM WL (TRAY / TRAY PROCEDURE) ×2 IMPLANT
PAD POSITIONING PINK XL (MISCELLANEOUS) ×2 IMPLANT
PORT ACCESS TROCAR AIRSEAL 12 (TROCAR) ×2 IMPLANT
PORT ACCESS TROCAR AIRSEAL 5M (TROCAR) ×2
SEAL CANN UNIV 5-8 DVNC XI (MISCELLANEOUS) ×8 IMPLANT
SEAL XI 5MM-8MM UNIVERSAL (MISCELLANEOUS) ×8
SEALER VESSEL DA VINCI XI (MISCELLANEOUS) ×2
SEALER VESSEL EXT DVNC XI (MISCELLANEOUS) IMPLANT
SET TRI-LUMEN FLTR TB AIRSEAL (TUBING) ×2 IMPLANT
SOL PREP POV-IOD 4OZ 10% (MISCELLANEOUS) ×4 IMPLANT
SPIKE FLUID TRANSFER (MISCELLANEOUS) ×2 IMPLANT
SPONGE T-LAP 18X18 ~~LOC~~+RFID (SPONGE) IMPLANT
SURGIFLO W/THROMBIN 8M KIT (HEMOSTASIS) IMPLANT
SUT MNCRL AB 4-0 PS2 18 (SUTURE) IMPLANT
SUT PDS AB 1 TP1 96 (SUTURE) IMPLANT
SUT VIC AB 0 CT1 27 (SUTURE) ×2
SUT VIC AB 0 CT1 27XBRD ANTBC (SUTURE) IMPLANT
SUT VIC AB 2-0 CT1 27 (SUTURE)
SUT VIC AB 2-0 CT1 TAPERPNT 27 (SUTURE) IMPLANT
SUT VIC AB 4-0 PS2 18 (SUTURE) ×4 IMPLANT
SUT VLOC 180 0 9IN  GS21 (SUTURE)
SUT VLOC 180 0 9IN GS21 (SUTURE) IMPLANT
SYR 10ML LL (SYRINGE) IMPLANT
SYS BAG RETRIEVAL 10MM (BASKET) ×2
SYS WOUND ALEXIS 18CM MED (MISCELLANEOUS)
SYSTEM BAG RETRIEVAL 10MM (BASKET) IMPLANT
SYSTEM WOUND ALEXIS 18CM MED (MISCELLANEOUS) IMPLANT
TOWEL OR NON WOVEN STRL DISP B (DISPOSABLE) IMPLANT
TRAP SPECIMEN MUCUS 40CC (MISCELLANEOUS) IMPLANT
TRAY FOLEY MTR SLVR 16FR STAT (SET/KITS/TRAYS/PACK) ×2 IMPLANT
UNDERPAD 30X36 HEAVY ABSORB (UNDERPADS AND DIAPERS) ×4 IMPLANT
WATER STERILE IRR 1000ML POUR (IV SOLUTION) ×2 IMPLANT
YANKAUER SUCT BULB TIP 10FT TU (MISCELLANEOUS) IMPLANT

## 2021-12-24 NOTE — Anesthesia Postprocedure Evaluation (Signed)
Anesthesia Post Note  Patient: Alexandra [name]  Procedure(s) Performed: XI ROBOTIC ASSISTED TOTAL HYSTERECTOMY BILATERAL SALPINGO OOPHORECTOMY WITH OMENTECTOMY (Bilateral) SENTINEL NODE BIOPSY (Bilateral) XI ROBOTIC ASSISTED LAPAROSCOPIC LYSIS OF ADHESION     Patient location during evaluation: PACU Anesthesia Type: General Level of consciousness: awake Pain management: pain level controlled Respiratory status: spontaneous breathing Cardiovascular status: stable Postop Assessment: no apparent nausea or vomiting Anesthetic complications: no   No notable events documented.  Last Vitals:  Vitals:   12/24/21 0601 12/24/21 1115  BP: (!) 146/74 (!) 159/84  Pulse:  78  Resp:  13  Temp:  (!) 36.4 C  SpO2:  100%    Last Pain:  Vitals:   12/24/21 1115  TempSrc:   PainSc: Asleep                 Earlee Herald

## 2021-12-24 NOTE — Anesthesia Procedure Notes (Signed)
Procedure Name: Intubation Date/Time: 12/24/2021 7:52 AM  Performed by: Gean Maidens, CRNAPre-anesthesia Checklist: Patient identified, Emergency Drugs available, Suction available, Patient being monitored and Timeout performed Patient Re-evaluated:Patient Re-evaluated prior to induction Oxygen Delivery Method: Circle system utilized Preoxygenation: Pre-oxygenation with 100% oxygen Induction Type: IV induction Ventilation: Mask ventilation without difficulty Laryngoscope Size: Mac and 3 Grade View: Grade II Tube type: Oral Tube size: 7.0 mm Number of attempts: 1 Airway Equipment and Method: Stylet Placement Confirmation: ETT inserted through vocal cords under direct vision, positive ETCO2 and breath sounds checked- equal and bilateral Secured at: 21 cm Tube secured with: Tape Dental Injury: Teeth and Oropharynx as per pre-operative assessment

## 2021-12-24 NOTE — Discharge Instructions (Addendum)
AFTER SURGERY INSTRUCTIONS   Return to work: 4-6 weeks if applicable  Restart Agrylin 2 weeks after surgery.   Activity: 1. Be up and out of the bed during the day.  Take a nap if needed.  You may walk up steps but be careful and use the hand rail.  Stair climbing will tire you more than you think, you may need to stop part way and rest.    2. No lifting or straining for 6 weeks over 10 pounds. No pushing, pulling, straining for 6 weeks.   3. No driving for around 1 week(s).  Do not drive if you are taking narcotic pain medicine and make sure that your reaction time has returned.    4. You can shower as soon as the next day after surgery. Shower daily.  Use your regular soap and water (not directly on the incision) and pat your incision(s) dry afterwards; don't rub.  No tub baths or submerging your body in water until cleared by your surgeon. If you have the soap that was given to you by pre-surgical testing that was used before surgery, you do not need to use it afterwards because this can irritate your incisions.    5. No sexual activity and nothing in the vagina for 8-10 weeks.   6. You may experience a small amount of clear drainage from your incisions, which is normal.  If the drainage persists, increases, or changes color please call the office.   7. Do not use creams, lotions, or ointments such as neosporin on your incisions after surgery until advised by your surgeon because they can cause removal of the dermabond glue on your incisions.     8. You may experience vaginal spotting after surgery or around the 6-8 week mark from surgery when the stitches at the top of the vagina begin to dissolve.  The spotting is normal but if you experience heavy bleeding, call our office.   9. Take Tylenol first for pain if you are able to take these medication and only use narcotic pain medication for severe pain not relieved by the Tylenol.  Monitor your Tylenol intake to a max of 4,000 mg in a 24  hour period.    Diet: 1. Low sodium Heart Healthy Diet is recommended but you are cleared to resume your normal (before surgery) diet after your procedure.   2. It is safe to use a laxative, such as Miralax or Colace, if you have difficulty moving your bowels. You have been prescribed Sennakot-S to take at bedtime every evening after surgery to keep bowel movements regular and to prevent constipation.     Wound Care: 1. Keep clean and dry.  Shower daily.   Reasons to call the Doctor: Fever - Oral temperature greater than 100.4 degrees Fahrenheit Foul-smelling vaginal discharge Difficulty urinating Nausea and vomiting Increased pain at the site of the incision that is unrelieved with pain medicine. Difficulty breathing with or without chest pain New calf pain especially if only on one side Sudden, continuing increased vaginal bleeding with or without clots.   Contacts: For questions or concerns you should contact:   Dr. Jeral Pinch at [phone]   Joylene John, NP at [phone]   After Hours: call [phone] and have the GYN Oncologist paged/contacted (after 5 pm or on the weekends).   Messages sent via mychart are for non-urgent matters and are not responded to after hours so for urgent needs, please call the after hours number.

## 2021-12-24 NOTE — Interval H&P Note (Signed)
History and Physical Interval Note:  12/24/2021 6:55 AM  Alexandra [name]  has presented today for surgery, with the diagnosis of HIGH GRADE ENDOMETRIAL CANCER.  The various methods of treatment have been discussed with the patient and family. After consideration of risks, benefits and other options for treatment, the patient has consented to  Procedure(s): XI ROBOTIC ASSISTED TOTAL HYSTERECTOMY BILATERAL SALPINGO OOPHORECTOMY WITH OMENTECTOMY (Bilateral) SENTINEL NODE BIOPSY (N/A) POSSIBLE LYMPH NODE DISSECTION;POSSIBLE LAPAROTOMY (N/A) as a surgical intervention.  The patient's history has been reviewed, patient examined, no change in status, stable for surgery.  I have reviewed the patient's chart and labs.  Questions were answered to the patient's satisfaction.     Lafonda Mosses

## 2021-12-24 NOTE — Transfer of Care (Signed)
Immediate Anesthesia Transfer of Care Note  Patient: Alexandra [name]  Procedure(s) Performed: XI ROBOTIC ASSISTED TOTAL HYSTERECTOMY BILATERAL SALPINGO OOPHORECTOMY WITH OMENTECTOMY (Bilateral) SENTINEL NODE BIOPSY (Bilateral) XI ROBOTIC ASSISTED LAPAROSCOPIC LYSIS OF ADHESION  Patient Location: PACU  Anesthesia Type:General  Level of Consciousness: sedated, patient cooperative and responds to stimulation  Airway & Oxygen Therapy: Patient Spontanous Breathing and Patient connected to face mask oxygen  Post-op Assessment: Report given to RN and Post -op Vital signs reviewed and stable  Post vital signs: Reviewed and stable  Last Vitals:  Vitals Value Taken Time  BP 159/84 12/24/21 1115  Temp    Pulse 70 12/24/21 1116  Resp 12 12/24/21 1117  SpO2 96 % 12/24/21 1116  Vitals shown include unvalidated device data.  Last Pain:  Vitals:   12/24/21 0625  TempSrc:   PainSc: 0-No pain         Complications: No notable events documented.

## 2021-12-24 NOTE — Anesthesia Preprocedure Evaluation (Signed)
Anesthesia Evaluation  Patient identified by MRN, date of birth, ID band Patient awake    Reviewed: Allergy & Precautions, NPO status , Patient's Chart, lab work & pertinent test results  Airway Mallampati: II       Dental   Pulmonary neg pulmonary ROS,    breath sounds clear to auscultation       Cardiovascular hypertension, + Valvular Problems/Murmurs  Rhythm:Regular Rate:Normal     Neuro/Psych negative neurological ROS     GI/Hepatic negative GI ROS, Neg liver ROS,   Endo/Other  diabetes  Renal/GU Renal disease     Musculoskeletal   Abdominal   Peds  Hematology   Anesthesia Other Findings   Reproductive/Obstetrics                             Anesthesia Physical Anesthesia Plan  ASA: 3  Anesthesia Plan: General   Post-op Pain Management:    Induction: Intravenous  PONV Risk Score and Plan: 4 or greater and Ondansetron, Dexamethasone and Midazolam  Airway Management Planned: Oral ETT  Additional Equipment:   Intra-op Plan:   Post-operative Plan: Extubation in OR  Informed Consent: I have reviewed the patients History and Physical, chart, labs and discussed the procedure including the risks, benefits and alternatives for the proposed anesthesia with the patient or authorized representative who has indicated his/her understanding and acceptance.     Dental advisory given  Plan Discussed with: Anesthesiologist and CRNA  Anesthesia Plan Comments:         Anesthesia Quick Evaluation

## 2021-12-24 NOTE — Op Note (Signed)
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 Normal appearing liver otherwise, stomach, and diaphragm. Normal appearing omentum. Some adhesions of the cecum and ascending colon to the right abdominal sidewall and of the appendix to the right IP ligament and medial aspect of the broad ligament. Otherwise normal appearing small and large bowel. Small volume ascites in the pelvis. Normal appearing adnexa. No obvious adenopathy. Mapping successful to right presacral and left external iliac SLN. Some adhesions between the bladder peritoneum and the anterior uterus. Two small (<1-2 cm) intramural anterior LUS fibroids. Uterus somewhat enlarged and bulbous at the fundus. Numerous bulbous/cystic appearing lesions along the peritoneum - posterior cul de sac, uterosacral ligaments, anterior cul de sac. Findings most c/w endosalpingosis versus endometriosis. Frozen section of some of these lesions sent and not c/w metastatic clear cell carcinoma, defer further characterization to final pathology.   Estimated Blood Loss:  75 cc      Total IV Fluids: see I&O flowsheet         Specimens: uterus, cervix, bilateral tubes and ovaries, pelvic washings, right presacral SLN, left external iliac SLN, peritoneal lesions, infracolic omentum         Complications:  None apparent; patient tolerated the procedure  well.         Disposition: PACU - hemodynamically stable.  Procedure Details  The patient was seen in the Holding Room. The risks, benefits, complications, treatment options, and expected outcomes were discussed with the patient.  The patient concurred with the proposed plan, giving informed consent.  The site of surgery properly noted/marked. The patient was identified as Alexandra [name] and the procedure verified as a Robotic-assisted hysterectomy with bilateral salpingo oophorectomy with SLN biopsy, staging.   After induction of anesthesia, the patient was draped and prepped in the usual sterile manner. Patient was placed in supine position after anesthesia and draped and prepped in the usual sterile manner as follows: Her arms were tucked to her side with all appropriate precautions.  The patient was secure to the OR table with padding across her chest and tape.  The patient was placed in the semi-lithotomy position in [place].  The perineum and vagina were prepped with betadine. The patient was prepped with CholoraPrep and draped after the prep had been allowed to dry for 3 minutes.  A Time Out was held and the above information confirmed.  The urethra was prepped with Betadine. Foley catheter was placed.  A sterile speculum was placed in the vagina.  The cervix was grasped with a single-tooth tenaculum. '2mg'$  total of ICG was injected into the cervical stroma at 2 and 9 o'clock with 1cc injected at a 1cm and 77m depth (concentration 0.'5mg'$ /ml) in all locations. The cervix was dilated with PKennon Roundsdilators.  The ZUMI uterine manipulator with a medium colpotomizer ring was placed without difficulty.  A pneum occluder balloon was placed over the manipulator.  OG tube placement was confirmed and to suction.   Next, a 10 mm skin incision was made 1 cm below the subcostal margin in the  midclavicular line.  The 5 mm Optiview port and scope was used for direct entry.  Opening pressure was under 10 mm CO2.   The abdomen was insufflated and the findings were noted as above.   At this point and all points during the procedure, the patient's intra-abdominal pressure did not exceed 15 mmHg. Next, an 8 mm skin incision was made superior to the umbilicus and a right and left port were placed about 8 cm lateral to the robot port on the right and left side.  The 5 mm assist trocar was exchanged for a 10-12 mm port. All ports were placed under direct visualization.  The patient was placed in steep Trendelenburg.  Bowel was folded away into the upper abdomen.  Pelvic washings were collected. The robot was docked in the normal manner.  Attention was turned to the right and sharp lysis of adhesions was performed to mobilize the cecum and ascending colon from the abdominal sidewall. Once mobilized sufficiently, a fourth arm robotic trocar was placed on the right under direct visualization. This trocar was docked to the robot.   The right and left peritoneum were opened parallel to the IP ligament to open the retroperitoneal spaces bilaterally. On the right, further sharp dissection was necessary to mobilize the cecum from the pelvic brim and the appendix from its attachment to the medial aspect of the broad ligament. The round ligaments were transected. The SLN mapping was performed in bilateral pelvic basins. After identifying the ureters, the para rectal and paravesical spaces were opened up entirely with careful dissection below the level of the ureters bilaterally and to the depth of the uterine artery origin in order to skeletonize the uterine "web" and ensure visualization of all parametrial channels. The para-aortic basins were carefully exposed and evaluated for isolated para-aortic SLN's. Lymphatic channels were identified travelling to the following visualized sentinel lymph node's: right presacral lymph node and left external iliac lymph node. These SLN's were separated from their surrounding lymphatic tissue, removed  and sent for permanent pathology.  Given peritoneal findings, an area of peritoneum was resected overlying the left uterosacral ligament and sent for frozen section.   The hysterectomy was started.  The ureter was again noted to be on the medial leaf of the broad ligament.  The peritoneum above the ureter was incised and stretched and the infundibulopelvic ligament was skeletonized, cauterized and cut.  The posterior peritoneum was taken down to the level of the KOH ring.  The anterior peritoneum was also taken down. Care was taken during this dissection given adhesions of the bladder peritoneum quite superiorly to the uterus on either lateral aspect of the uterus. The bladder flap was created to the level of the KOH ring.  The uterine artery on the right side was skeletonized, cauterized and cut in the normal manner.  A similar procedure was performed on the left.  The colpotomy was made and the uterus, cervix, bilateral ovaries and tubes were amputated, placed in an Endocath bag, and delivered through the vagina.  Pedicles were inspected and excellent hemostasis was achieved.    One robotic instrument was removed and the patient was flattened using paired table motion. Using a combination of the vessel sealer and monopolar electrocautery, an infracolic omentectomy was performed. The omentum was placed in an endocatch bag. The patient was then placed in steep Trendelenburg again and the omentum was passed off the field through the vagina.   The colpotomy at the vaginal cuff was closed with Vicryl  on a CT1 needle in a running manner.  Irrigation was used and excellent hemostasis was achieved. Intra-abdominal pressure was decreased to 5 mm Hg with excellent hemostasis noted. Arista was placed over the presacral and bilateral retroperitoneum surgical beds. At this point in the procedure was completed.  Robotic instruments were removed under direct visulaization.  The robot was undocked. The fascia at the 10-12  mm port was closed with 0 Vicryl using a PMI fascial closure device under direct visualization.  The subcuticular tissue was closed with 4-0 Vicryl and the skin was closed with 4-0 Monocryl in a subcuticular manner.  Dermabond was applied.    The vagina was swabbed with minimal bleeding noted. Foley catheter was removed.  All sponge, lap and needle counts were correct x  3.   The patient was transferred to the recovery room in stable condition.  Jeral Pinch, MD

## 2021-12-25 ENCOUNTER — Telehealth: Payer: Self-pay

## 2021-12-25 ENCOUNTER — Encounter (HOSPITAL_COMMUNITY): Payer: Self-pay | Admitting: Gynecologic Oncology

## 2021-12-25 LAB — CYTOLOGY - NON PAP

## 2021-12-25 NOTE — Discharge Summary (Signed)
Physician Discharge Summary  Patient ID: Alexandra [name] MRN: [id number] DOB/AGE: Jul 15, 1950 71 y.o.  Admit date: 12/24/2021 Discharge date: 12/25/2021  Admission Diagnoses: Endometrial cancer [facility])  Discharge Diagnoses:  Principal Problem:   Endometrial cancer [facility]) Active Problems:   Uterine cancer [facility])   Discharged Condition:  The patient is in good condition and stable for discharge.    Hospital Course: On 12/24/2021, the patient underwent the following: Procedure(s): XI ROBOTIC ASSISTED TOTAL HYSTERECTOMY BILATERAL SALPINGO OOPHORECTOMY WITH OMENTECTOMY SENTINEL NODE BIOPSY XI ROBOTIC ASSISTED LAPAROSCOPIC LYSIS OF ADHESION.  The postoperative course was uneventful.  She was discharged to home as an outpatient.   Consults: None  Significant Diagnostic Studies: CT imaging preop  Treatments: surgery: see above  Discharge Exam: Blood pressure (!) (P) 152/79, pulse (P) 75, temperature (P) 97.9 F (36.6 C), temperature source (P) Oral, resp. rate (P) 16, weight 125 lb (56.7 kg), SpO2 (P) 96 %. See H&P  Disposition: Discharge disposition: 01-Home or Self Care       Discharge Instructions     Call MD for:  difficulty breathing, headache or visual disturbances   Complete by: As directed    Call MD for:  extreme fatigue   Complete by: As directed    Call MD for:  hives   Complete by: As directed    Call MD for:  persistant dizziness or light-headedness   Complete by: As directed    Call MD for:  persistant nausea and vomiting   Complete by: As directed    Call MD for:  redness, tenderness, or signs of infection (pain, swelling, redness, odor or green/yellow discharge around incision site)   Complete by: As directed    Call MD for:  severe uncontrolled pain   Complete by: As directed    Call MD for:  temperature >100.4   Complete by: As directed    Diet - low sodium heart healthy   Complete by: As directed    Driving Restrictions   Complete by: As directed     No driving for 1 week(s).  Do not take narcotics and drive. You need to make sure your reaction time has returned.   Increase activity slowly   Complete by: As directed    Lifting restrictions   Complete by: As directed    No lifting greater than 10 lbs, pushing, pulling, straining for 6 weeks.   Sexual Activity Restrictions   Complete by: As directed    No sexual activity, nothing in the vagina, for 8-10 weeks.      Allergies as of 12/24/2021   No Known Allergies      Medication List     STOP taking these medications    anagrelide 1 MG capsule Commonly known as: AGRYLIN       TAKE these medications    amLODipine 10 MG tablet Commonly known as: NORVASC TAKE 1 TABLET BY MOUTH EVERY DAY   aspirin EC 81 MG tablet Take 81 mg by mouth every evening.   folic acid 1 MG tablet Commonly known as: FOLVITE Take 2 tablets by mouth once daily   glucose blood test strip Commonly known as: ONE TOUCH ULTRA TEST Use to test blood sugar 1 times daily. **PT NEEDS FOLLOW UP APPT FOR FURTHER REFILLS**   Integra 62.5-62.5-40-3 MG Caps TAKE 1 CAPSULE BY MOUTH DAILY What changed: when to take this   lisinopril 20 MG tablet Commonly known as: ZESTRIL TAKE 2 TABLETS BY MOUTH EVERY DAY What changed: when to  take this   Nebivolol HCl 20 MG Tabs TAKE 1 TABLET(20 MG) BY MOUTH DAILY What changed: See the new instructions.   OneTouch Delica Lancets 42J Misc Use to test blood sugar 1 time daily. **PT NEEDS FOLLOW UP APPT FOR FURTHER REFILLS**   senna-docusate 8.6-50 MG tablet Commonly known as: Senokot-S Take 2 tablets by mouth at bedtime. For AFTER surgery, do not take if having diarrhea   traMADol 50 MG tablet Commonly known as: ULTRAM Take 1 tablet (50 mg total) by mouth every 6 (six) hours as needed for severe pain. For AFTER surgery only, do not take and drive   Vitamin D (Ergocalciferol) 1.25 MG (50000 UNIT) Caps capsule Commonly known as: DRISDOL TAKE 1 CAPSULE BY MOUTH 1  TIME A WEEK What changed: See the new instructions.        Follow-up Information     Lafonda Mosses, MD Follow up on 12/31/2021.   Specialty: Gynecologic Oncology Why: at 4:00 pm will be a PHONE visit with Dr. Berline Lopes to check in and discuss final path. IN PERSON visit will be on 01/16/22 at 8:45am at the [place] attached to [place] information: [address] [place] [address] [phone]                 Greater than thirty minutes were spend for face to face discharge instructions and discharge orders/summary in EPIC.   Signed: Dorothyann Gibbs 12/25/2021, 4:46 PM

## 2021-12-25 NOTE — Telephone Encounter (Signed)
Spoke with Alexandra [name] this morning. She states she is eating, drinking and urinating well. She has not had a BM yet but is passing gas. She is taking senokot as prescribed and encouraged her to drink plenty of water. She denies fever or chills. Incisions are dry and intact. She rates her pain 5/10. Pt states she has not had to take any Tylenol/Ibuprofen/Tramadol since she was discharged.  She will take Tylenol if needed today since she is ambulating more.  Instructed to call office with any fever, chills, purulent drainage, uncontrolled pain or any other questions or concerns. Patient verbalizes understanding.   Pt aware of post op appointments as well as the office number [phone] and after hours number [phone] to call if she has any questions or concerns

## 2021-12-26 ENCOUNTER — Other Ambulatory Visit: Payer: Self-pay

## 2021-12-26 DIAGNOSIS — C55 Malignant neoplasm of uterus, part unspecified: Secondary | ICD-10-CM | POA: Diagnosis not present

## 2021-12-26 DIAGNOSIS — C541 Malignant neoplasm of endometrium: Secondary | ICD-10-CM | POA: Diagnosis not present

## 2021-12-31 ENCOUNTER — Inpatient Hospital Stay: Payer: Medicare PPO | Attending: Hematology & Oncology | Admitting: Gynecologic Oncology

## 2021-12-31 ENCOUNTER — Encounter: Payer: Self-pay | Admitting: Gynecologic Oncology

## 2021-12-31 DIAGNOSIS — Z90722 Acquired absence of ovaries, bilateral: Secondary | ICD-10-CM

## 2021-12-31 DIAGNOSIS — Z7189 Other specified counseling: Secondary | ICD-10-CM

## 2021-12-31 DIAGNOSIS — Z90710 Acquired absence of both cervix and uterus: Secondary | ICD-10-CM

## 2021-12-31 DIAGNOSIS — C549 Malignant neoplasm of corpus uteri, unspecified: Secondary | ICD-10-CM

## 2021-12-31 NOTE — Progress Notes (Signed)
Gynecologic Oncology Telehealth Note: Gyn-Onc  I connected with Alexandra [name] on 12/31/21 at  4:00 PM EDT by telephone and verified that I am speaking with the correct person using two identifiers.  I discussed the limitations, risks, security and privacy concerns of performing an evaluation and management service by telemedicine and the availability of in-person appointments. I also discussed with the patient that there may be a patient responsible charge related to this service. The patient expressed understanding and agreed to proceed.  Other persons participating in the visit and their role in the encounter: none.  Patient's location: home Provider's location: [facility]  Reason for Visit: follow-up after surgery, treatment discussion  Treatment History: 12/24/21: Robotic-assisted lysis of adhesions, total hysterectomy with bilateral salpingoophorectomy, SLN biopsy bilaterally, resection of peritoneal lesions, omentectomy   Interval History: Recovery is going well. Soreness is improving daily. Bowels improving with use of stool softener. Denies urinary symptoms, vaginal bleeding or discharge. Appetite good, no nausea or emesis.   Past Medical/Surgical History: Past Medical History:  Diagnosis Date   Blood dyscrasia    Diabetes mellitus without complication ([place])    Goals of care, counseling/discussion 12/15/2019   Heart murmur    History of kidney stones    5-6 yrs ago. 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  10/30/2021   DILATATION & CURETTAGE/HYSTEROSCOPY WITH MYOSURE N/A 11/18/2021   Procedure: DILATATION & CURETTAGE/HYSTEROSCOPY WITH MYOSURE;  Surgeon: [name], MD;  Location: [place];  Service: Gynecology;   Laterality: N/A;   ECTOPIC PREGNANCY SURGERY  03/02/1982   salpingostomy   ROBOTIC ASSISTED LAPAROSCOPIC LYSIS OF ADHESION N/A 12/24/2021   Procedure: XI ROBOTIC ASSISTED LAPAROSCOPIC LYSIS OF ADHESION;  Surgeon: Lafonda Mosses, MD;  Location: [facility];  Service: Gynecology;  Laterality: N/A;   SENTINEL NODE BIOPSY Bilateral 12/24/2021   Procedure: SENTINEL NODE BIOPSY;  Surgeon: Lafonda Mosses, MD;  Location: [facility];  Service: Gynecology;  Laterality: Bilateral;    Family History  Problem Relation Age of Onset   Heart disease Mother        died age 71   Kidney disease Mother        family hx   Heart attack Father 42       massive   Hypertension Sister    Hypertension Sister    Hypertension Sister    Hypertension Sister    Arthritis Other        family hx   Diabetes Other        family hx   Stroke Other         dialysis   Colon cancer Neg Hx    Breast cancer Neg Hx    Ovarian cancer Neg Hx    Endometrial cancer Neg Hx    Pancreatic cancer Neg Hx    Prostate cancer Neg Hx     Social History   Socioeconomic History   Marital status: Married    Spouse name: Not on file   Number of children: Not on file   Years of education: Not on file   Highest education level: Not on file  Occupational History   Occupation: retired   Occupation: part-time works for her church  Tobacco  Use   Smoking status: Never   Smokeless tobacco: Never   Tobacco comments:    never used tobacco  Vaping Use   Vaping Use: Never used  Substance and Sexual Activity   Alcohol use: No    Alcohol/week: 0.0 standard drinks of alcohol   Drug use: No   Sexual activity: Not Currently    Partners: Male    Birth control/protection: Post-menopausal  Other Topics Concern   Not on file  Social History Narrative   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   No tobacco no ethoh little caffiene .    Social Determinants of Health   Financial Resource Strain:  Low Risk  (12/25/2020)   Overall Financial Resource Strain (CARDIA)    Difficulty of Paying Living Expenses: Not hard at all  Food Insecurity: No Food Insecurity (12/25/2020)   Hunger Vital Sign    Worried About Running Out of Food in the Last Year: Never true    Ran Out of Food in the Last Year: Never true  Transportation Needs: No Transportation Needs (12/25/2020)   PRAPARE - Hydrologist (Medical): No    Lack of Transportation (Non-Medical): No  Physical Activity: Insufficiently Active (12/25/2020)   Exercise Vital Sign    Days of Exercise per Week: 3 days    Minutes of Exercise per Session: 30 min  Stress: No Stress Concern Present (12/25/2020)   [place]    Feeling of Stress : Not at all  Social Connections: Moderately Integrated (12/25/2020)   Social Connection and Isolation Panel [NHANES]    Frequency of Communication with Friends and Family: Three times a week    Frequency of Social Gatherings with Friends and Family: Once a week    Attends Religious Services: More than 4 times per year    Active Member of [employer] or Organizations: No    Attends Music therapist: Never    Marital Status: Married    Current Medications:  Current Outpatient Medications:    amLODipine (NORVASC) 10 MG tablet, TAKE 1 TABLET BY MOUTH EVERY DAY, Disp: 90 tablet, Rfl: 1   aspirin EC 81 MG tablet, Take 81 mg by mouth every evening., Disp: , Rfl:    Fe Fum-FePoly-Vit C-Vit B3 (INTEGRA) 62.5-62.5-40-3 MG CAPS, TAKE 1 CAPSULE BY MOUTH DAILY (Patient taking differently: Take 1 capsule by mouth every evening.), Disp: 30 capsule, Rfl: 8   folic acid (FOLVITE) 1 MG tablet, Take 2 tablets by mouth once daily, Disp: 60 tablet, Rfl: 0   glucose blood (ONE TOUCH ULTRA TEST) test strip, Use to test blood sugar 1 times daily. **PT NEEDS FOLLOW UP APPT FOR FURTHER REFILLS** (Patient not taking: Reported on  11/28/2021), Disp: 100 each, Rfl: 0   lisinopril (ZESTRIL) 20 MG tablet, TAKE 2 TABLETS BY MOUTH EVERY DAY (Patient taking differently: Take 40 mg by mouth every evening.), Disp: 180 tablet, Rfl: 3   Nebivolol HCl 20 MG TABS, TAKE 1 TABLET(20 MG) BY MOUTH DAILY (Patient taking differently: Take 20 mg by mouth every evening.), Disp: 90 tablet, Rfl: 0   ONETOUCH DELICA LANCETS 60A MISC, Use to test blood sugar 1 time daily. **PT NEEDS FOLLOW UP APPT FOR FURTHER REFILLS** (Patient not taking: Reported on 11/28/2021), Disp: 100 each, Rfl: 1   senna-docusate (SENOKOT-S) 8.6-50 MG tablet, Take 2 tablets  by mouth at bedtime. For AFTER surgery, do not take if having diarrhea, Disp: 30 tablet, Rfl: 0   traMADol (ULTRAM) 50 MG tablet, Take 1 tablet (50 mg total) by mouth every 6 (six) hours as needed for severe pain. For AFTER surgery only, do not take and drive, Disp: 10 tablet, Rfl: 0   Vitamin D, Ergocalciferol, (DRISDOL) 1.25 MG (50000 UNIT) CAPS capsule, TAKE 1 CAPSULE BY MOUTH 1 TIME A WEEK (Patient taking differently: Take 50,000 Units by mouth every Sunday. TAKE 1 CAPSULE BY MOUTH 1 TIME A WEEK), Disp: 12 capsule, Rfl: 2  Review of Symptoms: Pertinent positives as per HPI.  Physical Exam: Deferred given limitations of phone visit.  Laboratory & Radiologic Studies: A. PERITONEAL IMPLANTS, EXCISION:  Serosal adhesions with extensive endosalpingosis  Negative for carcinoma   B. RIGHT PRE SACRAL SENTINEL LYMPH NODE, EXCISION:  One benign lymph node, negative for carcinoma (0/1)   C. LEFT EXTERNAL ILIAC SENTINEL LYMPH NODE, EXCISION:  Two benign lymph nodes, negative for carcinoma (0/2)  Focal endosalpingosis   D. UTERUS WITH RIGHT AND LEFT FALLOPIAN TUBE AND OVARY, HYSTERECTOMY AND  BILATERAL SALPINGO-OOPHORECTOMY:  High-grade endometrial carcinoma consistent with serous carcinoma (see  comment)  Tumor confined to endometrium (pT1a)  Adenomyosis  Benign leiomyomata, intramural, largest  measuring 1.3 centimeters in  greatest dimension  Benign endocervical polyps  Chronic cervicitis with squamous metaplasia and nabothian cysts  Bilateral ovarian Leydig cell hyperplasia  Endosalpingosis of right fallopian tube and bilateral ovaries  Serosal adhesions of left fallopian tube   E. OMENTUM, RESECTION:  Benign omentum with focal endosalpingosis   FINAL MICROSCOPIC DIAGNOSIS:  - No malignant cells identified  - Benign mesothelial cells and chronic inflammatory cells  - Minute fragment of benign omentum (cell block)   Assessment & Plan: Alexandra [name] is a 71 y.o. woman with Stage IA uterine serous carcinoma who presents for phone follow-up after surgery.  Doing well. Meeting post-operative milestones.  I discussed pathology with her.  High-grade malignancy confirmed on final pathology however it is limited to the endometrium.  Discussed that given high risk histology, adjuvant treatment is recommended.  We discussed that this is typically vaginal brachytherapy with consideration of systemic chemotherapy.  Patient is quite hesitant about additional treatment, especially systemic chemotherapy.  I have asked her to think about this and whether she would be willing to meet with Dr. Marin Olp, her medical oncologist, as well as one of our radiation oncologist to speak about both treatments.  I will check in with her next week.  I discussed the assessment and treatment plan with the patient. The patient was provided with an opportunity to ask questions and all were answered. The patient agreed with the plan and demonstrated an understanding of the instructions.   The patient was advised to call back or see an in-person evaluation if the symptoms worsen or if the condition fails to improve as anticipated.   8 minutes of total time was spent for this patient encounter, including preparation, phone counseling with the patient and coordination of care, and documentation of the  encounter.   Jeral Pinch, MD  Division of Gynecologic Oncology  Department of Obstetrics and Gynecology  [facility]

## 2022-01-06 ENCOUNTER — Other Ambulatory Visit: Payer: Self-pay | Admitting: Hematology & Oncology

## 2022-01-06 DIAGNOSIS — D473 Essential (hemorrhagic) thrombocythemia: Secondary | ICD-10-CM

## 2022-01-06 DIAGNOSIS — D508 Other iron deficiency anemias: Secondary | ICD-10-CM

## 2022-01-12 ENCOUNTER — Encounter: Payer: Self-pay | Admitting: Gynecologic Oncology

## 2022-01-12 NOTE — Progress Notes (Signed)
HER2 testing ordered on [id number] with Suanne Marker at [facility].

## 2022-01-13 LAB — SURGICAL PATHOLOGY

## 2022-01-14 ENCOUNTER — Other Ambulatory Visit: Payer: Self-pay | Admitting: *Deleted

## 2022-01-14 NOTE — Progress Notes (Signed)
The proposed treatment discussed in conference is for discussion purpose only and is not a binding recommendation.  The patients have not been physically examined, or presented with their treatment options.  Therefore, final treatment plans cannot be decided.  

## 2022-01-16 ENCOUNTER — Encounter: Payer: Self-pay | Admitting: Gynecologic Oncology

## 2022-01-16 ENCOUNTER — Other Ambulatory Visit: Payer: Self-pay

## 2022-01-16 ENCOUNTER — Inpatient Hospital Stay (HOSPITAL_BASED_OUTPATIENT_CLINIC_OR_DEPARTMENT_OTHER): Payer: Medicare PPO | Admitting: Gynecologic Oncology

## 2022-01-16 ENCOUNTER — Encounter: Payer: Medicare PPO | Admitting: Gynecologic Oncology

## 2022-01-16 VITALS — BP 136/76 | HR 86 | Temp 97.7°F | Resp 16 | Ht 71.0 in | Wt 121.0 lb

## 2022-01-16 DIAGNOSIS — C549 Malignant neoplasm of corpus uteri, unspecified: Secondary | ICD-10-CM

## 2022-01-16 DIAGNOSIS — Z9889 Other specified postprocedural states: Secondary | ICD-10-CM

## 2022-01-16 DIAGNOSIS — Z7189 Other specified counseling: Secondary | ICD-10-CM

## 2022-01-16 NOTE — Patient Instructions (Signed)
It was good to see you today.  You are healing very well after surgery.  Please remember, no heavy lifting until at least 6 weeks after surgery and nothing in the vagina for at least 8-10.  If you decide that you are interested in meeting with our radiation oncologist, please call the clinic and we will help set this visit up.  I will see you for follow-up in 3 months.  As always, if you develop any new and concerning symptoms before your next visit, please call to see me sooner.

## 2022-01-16 NOTE — Progress Notes (Signed)
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  Reports good appetite without nausea or emesis.  Has some abdominal soreness although this is improving.  Denies any vaginal bleeding or discharge.  Denies any urinary symptoms.  Is having bowel function, but notes some constipation and is taking Dulcolax every several days.  She had a reaction to Senokot and had to stop taking this.  Past Medical/Surgical History: Past Medical History:  Diagnosis Date   Blood dyscrasia    Diabetes mellitus without complication ([place])    Goals of care, counseling/discussion 12/15/2019   Heart murmur    History of kidney stones    5-6 yrs ago. 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  10/30/2021   DILATATION & CURETTAGE/HYSTEROSCOPY WITH MYOSURE N/A 11/18/2021   Procedure: DILATATION & CURETTAGE/HYSTEROSCOPY WITH MYOSURE;  Surgeon: [name], MD;  Location: [facility];  Service: Gynecology;  Laterality: N/A;   ECTOPIC PREGNANCY SURGERY  03/02/1982   salpingostomy   ROBOTIC ASSISTED LAPAROSCOPIC LYSIS OF ADHESION N/A 12/24/2021   Procedure: XI ROBOTIC ASSISTED LAPAROSCOPIC LYSIS OF ADHESION;  Surgeon: Lafonda Mosses, MD;  Location: [facility];  Service: Gynecology;  Laterality: N/A;   SENTINEL NODE BIOPSY Bilateral 12/24/2021   Procedure: SENTINEL NODE BIOPSY;  Surgeon: Lafonda Mosses, MD;  Location: [facility];  Service: Gynecology;  Laterality: Bilateral;    Family History   Problem Relation Age of Onset   Heart disease Mother        died age 72   Kidney disease Mother        family hx   Heart attack Father 20       massive   Hypertension Sister    Hypertension Sister    Hypertension Sister    Hypertension Sister    Arthritis Other        family hx   Diabetes Other        family hx   Stroke Other         dialysis   Colon cancer Neg Hx    Breast cancer Neg Hx    Ovarian cancer Neg Hx    Endometrial cancer Neg Hx    Pancreatic cancer Neg Hx    Prostate cancer Neg Hx     Social History   Socioeconomic History   Marital status: Married    Spouse name: Not on file   Number of children: Not on file   Years of education: Not on file   Highest education level: Not on file  Occupational History   Occupation: retired   Occupation: part-time works for her church  Tobacco Use   Smoking status: Never   Smokeless tobacco: Never   Tobacco comments:    never used tobacco  Vaping Use   Vaping Use: Never used  Substance and Sexual Activity   Alcohol use: No    Alcohol/week: 0.0 standard drinks of alcohol   Drug use: No  Sexual activity: Not Currently    Partners: Male    Birth control/protection: Post-menopausal  Other Topics Concern   Not on file  Social History Narrative   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   No tobacco no ethoh little caffiene .    Social Determinants of Health   Financial Resource Strain: Low Risk  (12/25/2020)   Overall Financial Resource Strain (CARDIA)    Difficulty of Paying Living Expenses: Not hard at all  Food Insecurity: No Food Insecurity (12/25/2020)   Hunger Vital Sign    Worried About Running Out of Food in the Last Year: Never true    Ran Out of Food in the Last Year: Never true  Transportation Needs: No Transportation Needs (12/25/2020)   PRAPARE - Hydrologist (Medical): No    Lack of Transportation (Non-Medical): No  Physical  Activity: Insufficiently Active (12/25/2020)   Exercise Vital Sign    Days of Exercise per Week: 3 days    Minutes of Exercise per Session: 30 min  Stress: No Stress Concern Present (12/25/2020)   [place]    Feeling of Stress : Not at all  Social Connections: Moderately Integrated (12/25/2020)   Social Connection and Isolation Panel [NHANES]    Frequency of Communication with Friends and Family: Three times a week    Frequency of Social Gatherings with Friends and Family: Once a week    Attends Religious Services: More than 4 times per year    Active Member of [employer] or Organizations: No    Attends Music therapist: Never    Marital Status: Married    Current Medications:  Current Outpatient Medications:    amLODipine (NORVASC) 10 MG tablet, TAKE 1 TABLET BY MOUTH EVERY DAY, Disp: 90 tablet, Rfl: 1   aspirin EC 81 MG tablet, Take 81 mg by mouth every evening., Disp: , Rfl:    Fe Fum-FePoly-Vit C-Vit B3 (INTEGRA) 62.5-62.5-40-3 MG CAPS, TAKE 1 CAPSULE BY MOUTH DAILY (Patient taking differently: Take 1 capsule by mouth every evening.), Disp: 30 capsule, Rfl: 8   folic acid (FOLVITE) 1 MG tablet, Take 2 tablets by mouth once daily, Disp: 60 tablet, Rfl: 0   lisinopril (ZESTRIL) 20 MG tablet, TAKE 2 TABLETS BY MOUTH EVERY DAY (Patient taking differently: Take 40 mg by mouth every evening.), Disp: 180 tablet, Rfl: 3   Nebivolol HCl 20 MG TABS, TAKE 1 TABLET(20 MG) BY MOUTH DAILY (Patient taking differently: Take 20 mg by mouth every evening.), Disp: 90 tablet, Rfl: 0   Vitamin D, Ergocalciferol, (DRISDOL) 1.25 MG (50000 UNIT) CAPS capsule, TAKE 1 CAPSULE BY MOUTH 1 TIME A WEEK (Patient taking differently: Take 50,000 Units by mouth every Sunday. TAKE 1 CAPSULE BY MOUTH 1 TIME A WEEK), Disp: 12 capsule, Rfl: 2  Review of Systems: + Bloating, constipation, swelling of the legs. Denies appetite changes, fevers,  chills, fatigue, unexplained weight changes. Denies hearing loss, neck lumps or masses, mouth sores, ringing in ears or voice changes. Denies cough or wheezing.  Denies shortness of breath. Denies chest pain or palpitations.  Denies abdominal pain, blood in stools, diarrhea, nausea, vomiting, or early satiety. Denies pain with intercourse, dysuria, frequency, hematuria or incontinence. Denies hot flashes, pelvic pain, vaginal bleeding or vaginal discharge.   Denies joint pain, back pain or muscle pain/cramps. Denies  itching, rash, or wounds. Denies dizziness, headaches, numbness or seizures. Denies swollen lymph nodes or glands, denies easy bruising or bleeding. Denies anxiety, depression, confusion, or decreased concentration.  Physical Exam: BP 136/76 (BP Location: Left Arm, Patient Position: Sitting)   Pulse 86   Temp 97.7 F (36.5 C) (Oral)   Resp 16   Ht _0  (1.803 m)   Wt 121 lb (54.9 kg)   BMI 16.88 kg/m  General: Alert, oriented, no acute distress. HEENT: Normocephalic, atraumatic, sclera anicteric. Chest: Unlabored breathing on room air. Abdomen: soft, nontender.  Normoactive bowel sounds.  No masses or hepatosplenomegaly appreciated.  Well-healed incisions.  Remaining Dermabond removed. Extremities: Grossly normal range of motion.  Warm, well perfused.  No edema bilaterally. GU: Normal appearing external genitalia without erythema, excoriation, or lesions.  Speculum exam reveals cuff intact, suture visible, no blood or discharge within the vaginal vault.  Bimanual exam reveals cuff intact, no fluctuance or tenderness to palpation.    Laboratory & Radiologic Studies: None new  Assessment & Plan: Alexandra [name] is a 71 y.o. 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  Tumor confined to endometrium, negative washings. MMR intact, MS-stable, Her 2 +.  The patient is doing well, meeting  milestones.  Discussed trial of MiraLAX daily for a couple weeks to see if this helps improve her bowel function.  Reviewed continued expectations and restrictions.  We discussed again findings from her surgery.  She was given a copy of her pathology report today. Given uterine serous carcinoma with negative washings and no myometrial invasion, we discussed retrospective studies looking at the utility of adjuvant therapy.  Some suggest benefit to chemotherapy as well as vaginal brachytherapy to decrease the risk of local recurrence.  Other studies suggest that for this patient population, there is not significant benefit to adjuvant therapy.  I reviewed the difficulty in treating this cancer if/when it recurs.  The patient is not interested in pursuing chemotherapy.  She would like to think a little bit longer about the idea of vaginal radiation.  I gave her the patient oncologist name here at the cancer center.  She will call my office if she would like referral placed.  If she decides against any adjuvant therapy, we will plan on follow-up visits every 3 months.  She was scheduled for this today.  We reviewed signs and symptoms that would be concerning for cancer recurrence, and I stressed the importance of calling if she develops any of these.  28 minutes of total time was spent for this patient encounter, including preparation, face-to-face counseling with the patient and coordination of care, and documentation of the encounter.  Jeral Pinch, MD  Division of Gynecologic Oncology  Department of Obstetrics and Gynecology  [facility]

## 2022-01-23 ENCOUNTER — Other Ambulatory Visit: Payer: Self-pay | Admitting: Internal Medicine

## 2022-01-27 ENCOUNTER — Ambulatory Visit (INDEPENDENT_AMBULATORY_CARE_PROVIDER_SITE_OTHER): Payer: Medicare PPO

## 2022-01-27 VITALS — Ht 71.0 in | Wt 121.0 lb

## 2022-01-27 DIAGNOSIS — Z0000 Encounter for general adult medical examination without abnormal findings: Secondary | ICD-10-CM | POA: Diagnosis not present

## 2022-01-27 NOTE — Patient Instructions (Addendum)
Alexandra [name] , Thank you for taking time to come for your Medicare Wellness Visit. I appreciate your ongoing commitment to your health goals. Please review the following plan we discussed and let me know if I can assist you in the future.   These are the goals we discussed:  Goals       Exercise 150 minutes per week (moderate activity)      Try to walk 5 days a week x 30 minutes       Increase physical activity (pt-stated)      I want to prepare my own food and less eating out.      Patient Stated      Keep blood pressure controlled and increase walking to 5 days a week and 30 minutes      Patient Stated      Walk a little more         This is a list of the screening recommended for you and due dates:  Health Maintenance  Topic Date Due   COVID-19 Vaccine (4 - 2023-24 season) 02/12/2022*   Colon Cancer Screening  02/24/2022*   Hepatitis C Screening: USPSTF Recommendation to screen - Ages 18-79 yo.  02/24/2022*   Zoster (Shingles) Vaccine (1 of 2) 02/24/2022*   Flu Shot  05/31/2022*   Pneumonia Vaccine (1 - PCV) 01/28/2023*   Hemoglobin A1C  06/18/2022   Yearly kidney health urinalysis for diabetes  08/26/2022   Complete foot exam   08/26/2022   Eye exam for diabetics  11/28/2022   Yearly kidney function blood test for diabetes  12/04/2022   Medicare Annual Wellness Visit  01/28/2023   Mammogram  11/08/2023   DEXA scan (bone density measurement)  Completed   HPV Vaccine  Aged Out  *Topic was postponed. The date shown is not the original due date.    Advanced directives: Advance directive discussed with you today. Even though you declined this today, please call our office should you change your mind, and we can give you the proper paperwork for you to fill out.   Conditions/risks identified: None  Next appointment: Follow up in one year for your annual wellness visit     Preventive Care 65 Years and Older, Female Preventive care refers to lifestyle choices and visits  with your health care provider that can promote health and wellness. What does preventive care include? A yearly physical exam. This is also called an annual well check. Dental exams once or twice a year. Routine eye exams. Ask your health care provider how often you should have your eyes checked. Personal lifestyle choices, including: Daily care of your teeth and gums. Regular physical activity. Eating a healthy diet. Avoiding tobacco and drug use. Limiting alcohol use. Practicing safe sex. Taking low-dose aspirin every day. Taking vitamin and mineral supplements as recommended by your health care provider. What happens during an annual well check? The services and screenings done by your health care provider during your annual well check will depend on your age, overall health, lifestyle risk factors, and family history of disease. Counseling  Your health care provider may ask you questions about your: Alcohol use. Tobacco use. Drug use. Emotional well-being. Home and relationship well-being. Sexual activity. Eating habits. History of falls. Memory and ability to understand (cognition). Work and work Statistician. Reproductive health. Screening  You may have the following tests or measurements: Height, weight, and BMI. Blood pressure. Lipid and cholesterol levels. These may be checked every 5 years, or more  frequently if you are over 88 years old. Skin check. Lung cancer screening. You may have this screening every year starting at age 87 if you have a 30-pack-year history of smoking and currently smoke or have quit within the past 15 years. Fecal occult blood test (FOBT) of the stool. You may have this test every year starting at age 46. Flexible sigmoidoscopy or colonoscopy. You may have a sigmoidoscopy every 5 years or a colonoscopy every 10 years starting at age 64. Hepatitis C blood test. Hepatitis B blood test. Sexually transmitted disease (STD) testing. Diabetes  screening. This is done by checking your blood sugar (glucose) after you have not eaten for a while (fasting). You may have this done every 1-3 years. Bone density scan. This is done to screen for osteoporosis. You may have this done starting at age 49. Mammogram. This may be done every 1-2 years. Talk to your health care provider about how often you should have regular mammograms. Talk with your health care provider about your test results, treatment options, and if necessary, the need for more tests. Vaccines  Your health care provider may recommend certain vaccines, such as: Influenza vaccine. This is recommended every year. Tetanus, diphtheria, and acellular pertussis (Tdap, Td) vaccine. You may need a Td booster every 10 years. Zoster vaccine. You may need this after age 68. Pneumococcal 13-valent conjugate (PCV13) vaccine. One dose is recommended after age 61. Pneumococcal polysaccharide (PPSV23) vaccine. One dose is recommended after age 16. Talk to your health care provider about which screenings and vaccines you need and how often you need them. This information is not intended to replace advice given to you by your health care provider. Make sure you discuss any questions you have with your health care provider. Document Released: 03/15/2015 Document Revised: 11/06/2015 Document Reviewed: 12/18/2014 Elsevier Interactive Patient Education  2017 [place] Prevention in the Home Falls can cause injuries. They can happen to people of all ages. There are many things you can do to make your home safe and to help prevent falls. What can I do on the outside of my home? Regularly fix the edges of walkways and driveways and fix any cracks. Remove anything that might make you trip as you walk through a door, such as a raised step or threshold. Trim any bushes or trees on the path to your home. Use bright outdoor lighting. Clear any walking paths of anything that might make someone  trip, such as rocks or tools. Regularly check to see if handrails are loose or broken. Make sure that both sides of any steps have handrails. Any raised decks and porches should have guardrails on the edges. Have any leaves, snow, or ice cleared regularly. Use sand or salt on walking paths during winter. Clean up any spills in your garage right away. This includes oil or grease spills. What can I do in the bathroom? Use night lights. Install grab bars by the toilet and in the tub and shower. Do not use towel bars as grab bars. Use non-skid mats or decals in the tub or shower. If you need to sit down in the shower, use a plastic, non-slip stool. Keep the floor dry. Clean up any water that spills on the floor as soon as it happens. Remove soap buildup in the tub or shower regularly. Attach bath mats securely with double-sided non-slip rug tape. Do not have throw rugs and other things on the floor that can make you trip. What can  I do in the bedroom? Use night lights. Make sure that you have a light by your bed that is easy to reach. Do not use any sheets or blankets that are too big for your bed. They should not hang down onto the floor. Have a firm chair that has side arms. You can use this for support while you get dressed. Do not have throw rugs and other things on the floor that can make you trip. What can I do in the kitchen? Clean up any spills right away. Avoid walking on wet floors. Keep items that you use a lot in easy-to-reach places. If you need to reach something above you, use a strong step stool that has a grab bar. Keep electrical cords out of the way. Do not use floor polish or wax that makes floors slippery. If you must use wax, use non-skid floor wax. Do not have throw rugs and other things on the floor that can make you trip. What can I do with my stairs? Do not leave any items on the stairs. Make sure that there are handrails on both sides of the stairs and use them.  Fix handrails that are broken or loose. Make sure that handrails are as long as the stairways. Check any carpeting to make sure that it is firmly attached to the stairs. Fix any carpet that is loose or worn. Avoid having throw rugs at the top or bottom of the stairs. If you do have throw rugs, attach them to the floor with carpet tape. Make sure that you have a light switch at the top of the stairs and the bottom of the stairs. If you do not have them, ask someone to add them for you. What else can I do to help prevent falls? Wear shoes that: Do not have high heels. Have rubber bottoms. Are comfortable and fit you well. Are closed at the toe. Do not wear sandals. If you use a stepladder: Make sure that it is fully opened. Do not climb a closed stepladder. Make sure that both sides of the stepladder are locked into place. Ask someone to hold it for you, if possible. Clearly mark and make sure that you can see: Any grab bars or handrails. First and last steps. Where the edge of each step is. Use tools that help you move around (mobility aids) if they are needed. These include: Canes. Walkers. Scooters. Crutches. Turn on the lights when you go into a dark area. Replace any light bulbs as soon as they burn out. Set up your furniture so you have a clear path. Avoid moving your furniture around. If any of your floors are uneven, fix them. If there are any pets around you, be aware of where they are. Review your medicines with your doctor. Some medicines can make you feel dizzy. This can increase your chance of falling. Ask your doctor what other things that you can do to help prevent falls. This information is not intended to replace advice given to you by your health care provider. Make sure you discuss any questions you have with your health care provider. Document Released: 12/13/2008 Document Revised: 07/25/2015 Document Reviewed: 03/23/2014 Elsevier Interactive Patient Education  2017  [employer].

## 2022-01-27 NOTE — Progress Notes (Signed)
Subjective:   Alexandra [name] is a 71 y.o. female who presents for Medicare Annual (Subsequent) preventive examination.  Review of Systems    Virtual Visit via Telephone Note  I connected with  Alexandra [name] on 01/27/22 at  8:45 AM EST by telephone and verified that I am speaking with the correct person using two identifiers.  Location: Patient: Home Provider: Office Persons participating in the virtual visit: patient/Nurse Health Advisor   I discussed the limitations, risks, security and privacy concerns of performing an evaluation and management service by telephone and the availability of in person appointments. The patient expressed understanding and agreed to proceed.  Interactive audio and video telecommunications were attempted between this nurse and patient, however failed, due to patient having technical difficulties OR patient did not have access to video capability.  We continued and completed visit with audio only.  Some vital signs may be absent or patient reported.   Alexandra Peaches, LPN  Cardiac Risk Factors include: advanced age (>74mn, >>9women);diabetes mellitus;hypertension     Objective:    Today's Vitals   01/27/22 0849  Weight: 121 lb (54.9 kg)  Height: _0  (1.803 m)   Body mass index is 16.88 kg/m.     01/27/2022    8:59 AM 12/18/2021   10:05 AM 12/17/2021   10:08 AM 12/03/2021   11:04 AM 11/28/2021    5:36 PM 11/18/2021    8:00 AM 10/24/2021   10:43 AM  Advanced Directives  Does Patient Have a Medical Advance Directive? _1  No No  Does patient want to make changes to medical advance directive?  No - Patient declined No - Patient declined No - Patient declined   No - Patient declined  Would patient like information on creating a medical advance directive? No - Patient declined No - Patient declined   No - Patient declined No - Patient declined No - Patient declined    Current Medications (verified) Outpatient Encounter  Medications as of 01/27/2022  Medication Sig   amLODipine (NORVASC) 10 MG tablet TAKE 1 TABLET BY MOUTH EVERY DAY   aspirin EC 81 MG tablet Take 81 mg by mouth every evening.   Fe Fum-FePoly-Vit C-Vit B3 (INTEGRA) 62.5-62.5-40-3 MG CAPS TAKE 1 CAPSULE BY MOUTH DAILY (Patient taking differently: Take 1 capsule by mouth every evening.)   folic acid (FOLVITE) 1 MG tablet Take 2 tablets by mouth once daily   lisinopril (ZESTRIL) 20 MG tablet TAKE 2 TABLETS BY MOUTH EVERY DAY (Patient taking differently: Take 40 mg by mouth every evening.)   Nebivolol HCl 20 MG TABS TAKE 1 TABLET(20 MG) BY MOUTH DAILY (Patient taking differently: Take 20 mg by mouth every evening.)   Vitamin D, Ergocalciferol, (DRISDOL) 1.25 MG (50000 UNIT) CAPS capsule TAKE 1 CAPSULE BY MOUTH 1 TIME A WEEK (Patient taking differently: Take 50,000 Units by mouth every Sunday. TAKE 1 CAPSULE BY MOUTH 1 TIME A WEEK)   No facility-administered encounter medications on file as of 01/27/2022.    Allergies (verified) Patient has no known allergies.   History: Past Medical History:  Diagnosis Date   Blood dyscrasia    Diabetes mellitus without complication ([facility]    Goals of care, counseling/discussion 12/15/2019   Heart murmur    History of kidney stones    5-6 yrs ago. 10/30/2021   Hypertension    echo    Iron deficiency anemia, unspecified 03/15/2013   Myeloproliferative neoplasm ([facility] 12/15/2019   Thrombocytosis  Past Surgical History:  Procedure Laterality Date   ABDOMINAL HYSTERECTOMY     BONE MARROW BIOPSY     has had 3 of them but doesnt remember when.  10/30/2021   DILATATION & CURETTAGE/HYSTEROSCOPY WITH MYOSURE N/A 11/18/2021   Procedure: DILATATION & CURETTAGE/HYSTEROSCOPY WITH MYOSURE;  Surgeon: [name], MD;  Location: [place];  Service: Gynecology;  Laterality: N/A;   ECTOPIC PREGNANCY SURGERY  03/02/1982   salpingostomy   ROBOTIC ASSISTED LAPAROSCOPIC LYSIS OF ADHESION N/A  12/24/2021   Procedure: XI ROBOTIC ASSISTED LAPAROSCOPIC LYSIS OF ADHESION;  Surgeon: Lafonda Mosses, MD;  Location: [facility];  Service: Gynecology;  Laterality: N/A;   SENTINEL NODE BIOPSY Bilateral 12/24/2021   Procedure: SENTINEL NODE BIOPSY;  Surgeon: Lafonda Mosses, MD;  Location: [facility];  Service: Gynecology;  Laterality: Bilateral;   Family History  Problem Relation Age of Onset   Heart disease Mother        died age 48   Kidney disease Mother        family hx   Heart attack Father 52       massive   Hypertension Sister    Hypertension Sister    Hypertension Sister    Hypertension Sister    Arthritis Other        family hx   Diabetes Other        family hx   Stroke Other         dialysis   Colon cancer Neg Hx    Breast cancer Neg Hx    Ovarian cancer Neg Hx    Endometrial cancer Neg Hx    Pancreatic cancer Neg Hx    Prostate cancer Neg Hx    Social History   Socioeconomic History   Marital status: Married    Spouse name: Not on file   Number of children: Not on file   Years of education: Not on file   Highest education level: Not on file  Occupational History   Occupation: retired   Occupation: part-time works for her church  Tobacco Use   Smoking status: Never   Smokeless tobacco: Never   Tobacco comments:    never used tobacco  Scientific laboratory technician Use: Never used  Substance and Sexual Activity   Alcohol use: No    Alcohol/week: 0.0 standard drinks of alcohol   Drug use: No   Sexual activity: Not Currently    Partners: Male    Birth control/protection: Post-menopausal  Other Topics Concern   Not on file  Social History Narrative   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   No tobacco no ethoh little caffiene .    Social Determinants of Health   Financial Resource Strain: Low Risk  (01/27/2022)   Overall Financial Resource Strain (CARDIA)    Difficulty of Paying Living Expenses: Not hard at all  Food  Insecurity: No Food Insecurity (01/27/2022)   Hunger Vital Sign    Worried About Running Out of Food in the Last Year: Never true    Ran Out of Food in the Last Year: Never true  Transportation Needs: No Transportation Needs (01/27/2022)   PRAPARE - Hydrologist (Medical): No    Lack of Transportation (Non-Medical): No  Physical Activity: Insufficiently Active (01/27/2022)   Exercise Vital Sign    Days of Exercise  per Week: 3 days    Minutes of Exercise per Session: 30 min  Stress: No Stress Concern Present (01/27/2022)   [place]    Feeling of Stress : Not at all  Social Connections: [place] (01/27/2022)   Social Connection and Isolation Panel [NHANES]    Frequency of Communication with Friends and Family: More than three times a week    Frequency of Social Gatherings with Friends and Family: More than three times a week    Attends Religious Services: More than 4 times per year    Active Member of [employer] or Organizations: Yes    Attends Music therapist: More than 4 times per year    Marital Status: Married    Tobacco Counseling Counseling given: Not Answered Tobacco comments: never used tobacco   Clinical Intake:  Pre-visit preparation completed: No Nutrition Risk Assessment:  Has the patient had any N/V/D within the last 2 months?  No  Does the patient have any non-healing wounds?  No  Has the patient had any unintentional weight loss or weight gain?  No   Diabetes:  Is the patient diabetic?  Yes  If diabetic, was a CBG obtained today?  No  Did the patient bring in their glucometer from home?  No  How often do you monitor your CBG's? PRN.   Financial Strains and Diabetes Management:  Are you having any financial strains with the device, your supplies or your medication? No .  Does the patient want to be seen by Chronic Care Management for  management of their diabetes?  No  Would the patient like to be referred to a Nutritionist or for Diabetic Management?  No   Diabetic Exams:  Diabetic Eye Exam: Completed No. Overdue for diabetic eye exam. Pt has been advised about the importance in completing this exam. A referral has been placed today. Message sent to referral coordinator for scheduling purposes. Advised pt to expect a call from office referred to regarding appt.  Diabetic Foot Exam: Completed No. Pt has been advised about the importance in completing this exam. Pt is scheduled for diabetic foot exam on Followed by Dr Remonia Richter.   Pain : No/denies pain     BMI - recorded: 16.88 Nutritional Status: BMI <19  Underweight Nutritional Risks: None Diabetes: Yes CBG done?: No Did pt. bring in CBG monitor from home?: No  How often do you need to have someone help you when you read instructions, pamphlets, or other written materials from your doctor or pharmacy?: 1 - Never  Diabetic?  Yes  Interpreter Needed?: No  Information entered by :: Rolene Arbour LPN   Activities of Daily Living    01/27/2022    8:57 AM 12/17/2021   10:10 AM  In your present state of health, do you have any difficulty performing the following activities:  Hearing? 0   Vision? 0   Difficulty concentrating or making decisions? 0   Walking or climbing stairs? 0   Dressing or bathing? 0   Doing errands, shopping? 0 0  Preparing Food and eating ? N   Using the Toilet? N   In the past six months, have you accidently leaked urine? N   Do you have problems with loss of bowel control? N   Managing your Medications? N   Managing your Finances? N   Housekeeping or managing your Housekeeping? N     Patient Care Team: Burnis Medin, MD as  PCP - General [name], MD as Attending Physician (Hematology and Oncology) Philemon Kingdom, MD as Consulting Physician (Internal Medicine)  Indicate any recent Medical Services you may have  received from other than [facility] providers in the past year (date may be approximate).     Assessment:   This is a routine wellness examination for Shanisha.  Hearing/Vision screen Hearing Screening - Comments:: Denies hearing difficulties   Vision Screening - Comments::  - up to date with routine eye exams with  Dr Katy Fitch  Dietary issues and exercise activities discussed: Exercise limited by: None identified   Goals Addressed               This Visit's Progress     Increase physical activity (pt-stated)        I want to prepare my own food and less eating out.       Depression Screen    01/27/2022    8:56 AM 08/25/2021   11:53 AM 12/25/2020    9:34 AM 03/20/2020   11:26 AM 12/26/2019    9:07 AM 10/18/2017   10:54 AM 10/18/2017   10:51 AM  PHQ 2/9 Scores  PHQ - 2 Score 0 0 0 0 0 0 0  PHQ- 9 Score  0     0    Fall Risk    08/25/2021   11:52 AM 12/25/2020    9:37 AM 03/20/2020   11:26 AM 12/26/2019    9:13 AM 10/18/2017   10:54 AM  Fall Risk   Falls in the past year? 0 0 0 0 No  Number falls in past yr: 0 0 0 0   Injury with Fall? 0 0  0   Risk for fall due to : No Fall Risks Impaired vision     Follow up Falls prevention discussed Falls prevention discussed  Falls prevention discussed     FALL RISK PREVENTION PERTAINING TO THE HOME:  Any stairs in or around the home? No  If so, are there any without handrails? No  Home free of loose throw rugs in walkways, pet beds, electrical cords, etc? Yes  Adequate lighting in your home to reduce risk of falls? Yes   ASSISTIVE DEVICES UTILIZED TO PREVENT FALLS:  Life alert? No  Use of a cane, walker or w/c? No  Grab bars in the bathroom? No  Shower chair or bench in shower? No  Elevated toilet seat or a handicapped toilet? No   TIMED UP AND GO:  Was the test performed? No . Audio Visit  Cognitive Function:        01/27/2022    8:59 AM 12/25/2020    9:38 AM 12/26/2019    9:15 AM  6CIT Screen  What Year? 0  points 0 points 0 points  What month? 0 points 0 points 0 points  What time? 0 points 0 points   Count back from 20 0 points 0 points 0 points  Months in reverse 0 points 0 points 0 points  Repeat phrase 0 points 0 points 4 points  Total Score 0 points 0 points     Immunizations Immunization History  Administered Date(s) Administered   PFIZER(Purple Top)SARS-COV-2 Vaccination 05/25/2019, 06/19/2019, 10/09/2020   PPD Test 03/15/2017, 02/27/2020      Flu Vaccine status: Declined, Education has been provided regarding the importance of this vaccine but patient still declined. Advised may receive this vaccine at local pharmacy or Health Dept. Aware to provide a copy of the vaccination  record if obtained from local pharmacy or Health Dept. Verbalized acceptance and understanding.  Pneumococcal vaccine status: Declined,  Education has been provided regarding the importance of this vaccine but patient still declined. Advised may receive this vaccine at local pharmacy or Health Dept. Aware to provide a copy of the vaccination record if obtained from local pharmacy or Health Dept. Verbalized acceptance and understanding.   Covid-19 vaccine status: Completed vaccines  Qualifies for Shingles Vaccine? Yes   Zostavax completed No   Shingrix Completed?: No.    Education has been provided regarding the importance of this vaccine. Patient has been advised to call insurance company to determine out of pocket expense if they have not yet received this vaccine. Advised may also receive vaccine at local pharmacy or Health Dept. Verbalized acceptance and understanding.  Screening Tests Health Maintenance  Topic Date Due   COVID-19 Vaccine (4 - 2023-24 season) 02/12/2022 (Originally 10/31/2021)   COLONOSCOPY (Pts 45-69yr Insurance coverage will need to be confirmed)  02/24/2022 (Originally 05/02/2019)   Hepatitis C Screening  02/24/2022 (Originally 05/07/1968)   Zoster Vaccines- Shingrix (1 of 2) 02/24/2022  (Originally 05/07/1969)   INFLUENZA VACCINE  05/31/2022 (Originally 09/30/2021)   Pneumonia Vaccine 71 Years old (1 - PCV) 01/28/2023 (Originally 05/08/2015)   HEMOGLOBIN A1C  06/18/2022   Diabetic kidney evaluation - Urine ACR  08/26/2022   FOOT EXAM  08/26/2022   OPHTHALMOLOGY EXAM  11/28/2022   Diabetic kidney evaluation - GFR measurement  12/04/2022   Medicare Annual Wellness (AWV)  01/28/2023   MAMMOGRAM  11/08/2023   DEXA SCAN  Completed   HPV VACCINES  Aged Out    Health Maintenance  There are no preventive care reminders to display for this patient.   Colorectal cancer screening: Type of screening: Colonoscopy. Completed 05/02/14. Repeat every 5 years  Mammogram status: Completed 11/07/21. Repeat every year  Bone Density status: Completed 07/20/16. Results reflect: Bone density results: OSTEOPOROSIS. Repeat every   years.  Lung Cancer Screening: (Low Dose CT Chest recommended if Age 71-80years, 30 pack-year currently smoking OR have quit w/in 15years.) does not qualify.    Additional Screening:  Hepatitis C Screening: does qualify; Deferred  Vision Screening: Recommended annual ophthalmology exams for early detection of glaucoma and other disorders of the eye. Is the patient up to date with their annual eye exam?  Yes  Who is the provider or what is the name of the office in which the patient attends annual eye exams? Dr GKaty FitchIf pt is not established with a provider, would they like to be referred to a provider to establish care? No .   Dental Screening: Recommended annual dental exams for proper oral hygiene  Community Resource Referral / Chronic Care Management:  CRR required this visit?  No   CCM required this visit?  No      Plan:     I have personally reviewed and noted the following in the patient's chart:   Medical and social history Use of alcohol, tobacco or illicit drugs  Current medications and supplements including opioid prescriptions. Patient is not  currently taking opioid prescriptions. Functional ability and status Nutritional status Physical activity Advanced directives List of other physicians Hospitalizations, surgeries, and ER visits in previous 12 months Vitals Screenings to include cognitive, depression, and falls Referrals and appointments  In addition, I have reviewed and discussed with patient certain preventive protocols, quality metrics, and best practice recommendations. A written personalized care plan for preventive services as well as  general preventive health recommendations were provided to patient.     Alexandra Peaches, LPN   [id number]   Nurse Notes: None

## 2022-01-29 ENCOUNTER — Telehealth: Payer: Self-pay | Admitting: *Deleted

## 2022-01-29 NOTE — Telephone Encounter (Signed)
Message received from patient wanting to know when to come in for next appointment.  Message sent to scheduling.

## 2022-01-31 ENCOUNTER — Other Ambulatory Visit: Payer: Self-pay | Admitting: Hematology & Oncology

## 2022-01-31 DIAGNOSIS — D508 Other iron deficiency anemias: Secondary | ICD-10-CM

## 2022-01-31 DIAGNOSIS — D473 Essential (hemorrhagic) thrombocythemia: Secondary | ICD-10-CM

## 2022-02-12 ENCOUNTER — Other Ambulatory Visit: Payer: Self-pay | Admitting: *Deleted

## 2022-02-12 DIAGNOSIS — D508 Other iron deficiency anemias: Secondary | ICD-10-CM

## 2022-02-12 DIAGNOSIS — C541 Malignant neoplasm of endometrium: Secondary | ICD-10-CM

## 2022-02-12 DIAGNOSIS — D631 Anemia in chronic kidney disease: Secondary | ICD-10-CM

## 2022-02-13 ENCOUNTER — Inpatient Hospital Stay (HOSPITAL_BASED_OUTPATIENT_CLINIC_OR_DEPARTMENT_OTHER): Payer: Medicare PPO | Admitting: Hematology & Oncology

## 2022-02-13 ENCOUNTER — Encounter: Payer: Self-pay | Admitting: Hematology & Oncology

## 2022-02-13 ENCOUNTER — Inpatient Hospital Stay: Payer: Medicare PPO | Attending: Hematology & Oncology

## 2022-02-13 ENCOUNTER — Inpatient Hospital Stay: Payer: Medicare PPO

## 2022-02-13 VITALS — BP 147/79 | HR 91 | Temp 97.8°F | Resp 20 | Ht 71.0 in | Wt 125.1 lb

## 2022-02-13 DIAGNOSIS — Z90722 Acquired absence of ovaries, bilateral: Secondary | ICD-10-CM | POA: Diagnosis not present

## 2022-02-13 DIAGNOSIS — D471 Chronic myeloproliferative disease: Secondary | ICD-10-CM

## 2022-02-13 DIAGNOSIS — Z9079 Acquired absence of other genital organ(s): Secondary | ICD-10-CM | POA: Insufficient documentation

## 2022-02-13 DIAGNOSIS — C541 Malignant neoplasm of endometrium: Secondary | ICD-10-CM | POA: Insufficient documentation

## 2022-02-13 DIAGNOSIS — N189 Chronic kidney disease, unspecified: Secondary | ICD-10-CM | POA: Diagnosis not present

## 2022-02-13 DIAGNOSIS — D631 Anemia in chronic kidney disease: Secondary | ICD-10-CM | POA: Insufficient documentation

## 2022-02-13 DIAGNOSIS — Z90710 Acquired absence of both cervix and uterus: Secondary | ICD-10-CM | POA: Diagnosis not present

## 2022-02-13 DIAGNOSIS — D473 Essential (hemorrhagic) thrombocythemia: Secondary | ICD-10-CM | POA: Insufficient documentation

## 2022-02-13 DIAGNOSIS — D508 Other iron deficiency anemias: Secondary | ICD-10-CM

## 2022-02-13 LAB — CBC WITH DIFFERENTIAL (CANCER CENTER ONLY)
Abs Immature Granulocytes: 0.02 10*3/uL (ref 0.00–0.07)
Basophils Absolute: 0.1 10*3/uL (ref 0.0–0.1)
Basophils Relative: 1 %
Eosinophils Absolute: 0.2 10*3/uL (ref 0.0–0.5)
Eosinophils Relative: 4 %
HCT: 25.4 % — ABNORMAL LOW (ref 36.0–46.0)
Hemoglobin: 8 g/dL — ABNORMAL LOW (ref 12.0–15.0)
Immature Granulocytes: 0 %
Lymphocytes Relative: 27 %
Lymphs Abs: 1.7 10*3/uL (ref 0.7–4.0)
MCH: 25.5 pg — ABNORMAL LOW (ref 26.0–34.0)
MCHC: 31.5 g/dL (ref 30.0–36.0)
MCV: 80.9 fL (ref 80.0–100.0)
Monocytes Absolute: 0.8 10*3/uL (ref 0.1–1.0)
Monocytes Relative: 12 %
Neutro Abs: 3.6 10*3/uL (ref 1.7–7.7)
Neutrophils Relative %: 56 %
Platelet Count: 314 10*3/uL (ref 150–400)
RBC: 3.14 MIL/uL — ABNORMAL LOW (ref 3.87–5.11)
RDW: 18.7 % — ABNORMAL HIGH (ref 11.5–15.5)
WBC Count: 6.3 10*3/uL (ref 4.0–10.5)
nRBC: 0 % (ref 0.0–0.2)

## 2022-02-13 LAB — CMP (CANCER CENTER ONLY)
ALT: 13 U/L (ref 0–44)
AST: 15 U/L (ref 15–41)
Albumin: 4.3 g/dL (ref 3.5–5.0)
Alkaline Phosphatase: 78 U/L (ref 38–126)
Anion gap: 8 (ref 5–15)
BUN: 31 mg/dL — ABNORMAL HIGH (ref 8–23)
CO2: 28 mmol/L (ref 22–32)
Calcium: 9.2 mg/dL (ref 8.9–10.3)
Chloride: 107 mmol/L (ref 98–111)
Creatinine: 1.05 mg/dL — ABNORMAL HIGH (ref 0.44–1.00)
GFR, Estimated: 57 mL/min — ABNORMAL LOW (ref >60)
Glucose, Bld: 104 mg/dL — ABNORMAL HIGH (ref 70–99)
Potassium: 3.8 mmol/L (ref 3.5–5.1)
Sodium: 143 mmol/L (ref 135–145)
Total Bilirubin: 0.6 mg/dL (ref 0.3–1.2)
Total Protein: 7.2 g/dL (ref 6.5–8.1)

## 2022-02-13 NOTE — Progress Notes (Signed)
Plan Alexandra [name]  It is a [employer].  Hematology and Oncology Follow Up Visit  Alexandra [name] [id number] January 31, 1951 71 y.o. 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 daily  Folic acid 2 mg p.o. daily  Aspirin 81 mg p.o. daily  Procrit 40,000 units SQ weekly for hemoglobin less than 10-patient declined to date S/p TAH-BSO -- 12/24/2021  Interim History:  [name] is back for followup.  Since her last saw her, she underwent surgery because of a uterine cancer.  She had a laparoscopic procedure on 12/24/2021.  Dr. Berline [name], as always, did a very thorough job.  The pathology report [id number]) showed a high-grade endometrial carcinoma.  This was confined to the endometrium.  There were 3 lymph nodes that were all negative.  She had a peritoneal implant that was removed again this was negative.  Omentum did not show any disease.  The tumor was MMR proficient.  She did not wish to have any adjuvant radiation therapy.  She really looks good.  We did have to give her Procrit to try to get her hemoglobin little bit higher for further surgery.  I think this did go well.  She feels well.  She looks good.  She has had no specific complaints.  She has had no problems with nausea or vomiting.  There is been no change in bowel or bladder habits.  She has had no cough or shortness of breath.  She has had no leg swelling.  She is little bit worried because she had to be off anagrelide for a while.  However, everything certainly has come quite nicely.  She has had no headache.  She has had no bleeding.  I think she sees Dr. Berline [name] probably in February.  Currently, I would say performance status is probably ECOG 1.    Medications:  Current Outpatient Medications:    amLODipine (NORVASC) 10 MG tablet, TAKE 1 TABLET  BY MOUTH EVERY DAY, Disp: 90 tablet, Rfl: 1   aspirin EC 81 MG tablet, Take 81 mg by mouth every evening., Disp: , Rfl:    Fe Fum-FePoly-Vit C-Vit B3 (INTEGRA) 62.5-62.5-40-3 MG CAPS, TAKE 1 CAPSULE BY MOUTH DAILY (Patient taking differently: Take 1 capsule by mouth every evening.), Disp: 30 capsule, Rfl: 8   folic acid (FOLVITE) 1 MG tablet, Take 2 tablets by mouth once daily, Disp: 60 tablet, Rfl: 0   lisinopril (ZESTRIL) 20 MG tablet, TAKE 2 TABLETS BY MOUTH EVERY DAY (Patient taking differently: Take 40 mg by mouth every evening.), Disp: 180 tablet, Rfl: 3   Nebivolol HCl 20 MG TABS, TAKE 1 TABLET(20 MG) BY MOUTH DAILY, Disp: 90 tablet, Rfl: 1   Vitamin D, Ergocalciferol, (DRISDOL) 1.25 MG (50000 UNIT) CAPS capsule, TAKE 1 CAPSULE BY MOUTH 1 TIME A WEEK (Patient taking differently: Take 50,000 Units by mouth every Sunday. TAKE 1 CAPSULE BY MOUTH 1 TIME A WEEK), Disp: 12 capsule, Rfl: 2  Allergies: No Known Allergies  Past Medical History, Surgical history, Social history, and Family History were reviewed and updated.  Review of Systems: Review of Systems  All other systems reviewed and are negative. Marland Kitchen  Physical Exam:  height is _0  (1.803 m) and weight is 125 lb 1.9 oz (56.8 kg). Her oral temperature is 97.8 F (36.6 C). Her blood pressure is 147/79 (abnormal)  and her pulse is 91. Her respiration is 20 and oxygen saturation is 100%. =jh  Physical Exam Vitals reviewed.  HENT:     Head: Normocephalic and atraumatic.  Eyes:     Pupils: Pupils are equal, round, and reactive to light.  Cardiovascular:     Rate and Rhythm: Normal rate and regular rhythm.     Heart sounds: Normal heart sounds.  Pulmonary:     Effort: Pulmonary effort is normal.     Breath sounds: Normal breath sounds.  Abdominal:     General: Bowel sounds are normal.     Palpations: Abdomen is soft.     Comments: Abdominal exam shows well-healed laparoscopic scars.  There is no fluid wave.  There is no guarding or  rebound tenderness.  There is no palpable hepatosplenomegaly.  Musculoskeletal:        General: No tenderness or deformity. Normal range of motion.     Cervical back: Normal range of motion.  Lymphadenopathy:     Cervical: No cervical adenopathy.  Skin:    General: Skin is warm and dry.     Findings: No erythema or rash.  Neurological:     Mental Status: She is alert and oriented to person, place, and time.  Psychiatric:        Behavior: Behavior normal.        Thought Content: Thought content normal.        Judgment: Judgment normal.      Lab Results  Component Value Date   WBC 6.3 02/13/2022   HGB 8.0 (L) 02/13/2022   HCT 25.4 (L) 02/13/2022   MCV 80.9 02/13/2022   PLT 314 02/13/2022     Chemistry      Component Value Date/Time   NA 141 12/03/2021 1031   NA 144 02/02/2017 1158   NA 140 07/24/2016 1007   K 3.6 12/03/2021 1031   K 3.7 02/02/2017 1158   K 3.6 07/24/2016 1007   CL 105 12/03/2021 1031   CL 103 02/02/2017 1158   CO2 29 12/03/2021 1031   CO2 29 02/02/2017 1158   CO2 26 07/24/2016 1007   BUN 25 (H) 12/03/2021 1031   BUN 20 02/02/2017 1158   BUN 30.3 (H) 07/24/2016 1007   CREATININE 1.07 (H) 12/03/2021 1031   CREATININE 0.9 02/02/2017 1158   CREATININE 1.1 07/24/2016 1007   GLU 129 02/02/2017 0000      Component Value Date/Time   CALCIUM 9.8 12/03/2021 1031   CALCIUM 9.0 02/02/2017 1158   CALCIUM 9.3 07/24/2016 1007   ALKPHOS 67 12/03/2021 1031   ALKPHOS 110 (H) 02/02/2017 1158   ALKPHOS 104 07/24/2016 1007   AST 14 (L) 12/03/2021 1031   AST 13 07/24/2016 1007   ALT 11 12/03/2021 1031   ALT 26 02/02/2017 1158   ALT 15 07/24/2016 1007   BILITOT 0.7 12/03/2021 1031   BILITOT 0.55 07/24/2016 1007      Impression and Plan: Alexandra [name] is 71 year old [name]. She has essential thrombocythemia. She actually is Calreticulin positive.  Thankfully, she has a very early stage endometrial carcinoma.  I think she will be followed by Dr.  Berline [name] for this.  I am not sure Dr. Berline [name] feels that any follow-up CT scans need to be done for such a early stage tumor.  I do not think she needs any Procrit.  Again, she got Procrit just so that we could get her hemoglobin little bit better for her surgery.  She is  always try to avoid Procrit before she needed surgery.  She is doing well with a hemoglobin of 8-9.  At this point, we will plan to get her back to see [place] in about 6 weeks or so.  Is so happy that everything turned out well with respect to this endometrial cancer.  I am just not surprised by the exquisite care and tentacle expertise by Dr. Berline [name].     [name], MD 12/15/20238:13 AM

## 2022-03-05 DIAGNOSIS — H40013 Open angle with borderline findings, low risk, bilateral: Secondary | ICD-10-CM | POA: Diagnosis not present

## 2022-03-06 ENCOUNTER — Other Ambulatory Visit: Payer: Self-pay | Admitting: Internal Medicine

## 2022-03-30 ENCOUNTER — Other Ambulatory Visit: Payer: Self-pay | Admitting: Hematology & Oncology

## 2022-03-30 DIAGNOSIS — D473 Essential (hemorrhagic) thrombocythemia: Secondary | ICD-10-CM

## 2022-03-30 DIAGNOSIS — D508 Other iron deficiency anemias: Secondary | ICD-10-CM

## 2022-04-02 NOTE — Progress Notes (Signed)
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  Endocervical lesion thought to represent a polyp measures 7 mm.  Endometrium is asymmetrically thickened measuring up to 22 mm, noted to be cystic and vascular concerning for possible polyps.  Moderate free fluid noted.  Neither ovary well appreciated.    10/10/2021 Initial Biopsy   EMB: benign endometrial polyp, scant benign endocervix.    11/18/2021 Surgery   Endocervical polypectomy, endometrial polypectomy, and D&C. Findings at the time of surgery included an enlarged anteverted uterus, no adnexal masses. Cervical polyp noted. On hysteroscopy, multiple endometrial polyps with some thickening of the uterine wall noted. Final pathology revealed benign cervical polyp with metaplastic changes. Endometrial polyp revealed high-grade serous carcinoma as did the endometrial curettage specimen.    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 Results of the tumor marker test revealed normal level, 17.1.   12/05/2021 Imaging   CT C/A/P: 1. Heterogeneity of the uterus and cervix with endometrial fluid, compatible with the provided history of endometrial cancer. No evidence of metastatic disease. 2. Small pericardial effusion. 3. Liver may be mildly steatotic. 4. Retroareolar left breast nodule measures 1.5 cm. Patient underwent diagnostic left mammogram and ultrasound on 11/21/2021. 5. Overall dense appearance of the bones, compatible with a known history of myeloproliferative neoplasm. 6.  Aortic atherosclerosis (ICD10-I70.0).    12/24/2021 Surgery   Robotic-assisted lysis of adhesions, total hysterectomy with bilateral salpingoophorectomy, SLN biopsy bilaterally, resection of peritoneal lesions, omentectomy    Findings:  On EUA, 10-12 cm moderately mobile uterus. 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 Normal appearing liver otherwise, stomach, and diaphragm. Normal appearing omentum. Some adhesions of the cecum and ascending colon to the right abdominal sidewall and of the appendix to the right IP ligament and medial aspect of the broad ligament. Otherwise normal appearing small and large bowel. Small volume ascites in the pelvis. Normal appearing adnexa. No obvious adenopathy. Mapping successful to right presacral and left external iliac SLN. Some adhesions between the bladder peritoneum and the anterior uterus. Two small (<1-2 cm) intramural anterior LUS fibroids. Uterus somewhat enlarged and bulbous at the fundus. Numerous bulbous/cystic appearing lesions along the peritoneum - posterior cul de sac, uterosacral ligaments, anterior cul de sac. Findings most c/w endosalpingosis versus endometriosis. Frozen section of some of these lesions sent and not c/w metastatic clear cell carcinoma, defer further characterization to final pathology.     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  Denies any abdominal or pelvic pain.  Denies vaginal bleeding or discharge.  Reports good appetite.  Bowels working normally, took somewhat longer after surgery.  Denies any bladder symptoms.  Past Medical/Surgical History: Past Medical History:  Diagnosis Date   Blood dyscrasia    Diabetes mellitus without complication ([place])    Goals of  care, counseling/discussion 12/15/2019  Heart murmur    History of kidney stones    5-6 yrs ago. 10/30/2021   Hypertension    echo    Iron deficiency anemia, unspecified 03/15/2013   Myeloproliferative neoplasm ([place]) 12/15/2019   Thrombocytosis     Past Surgical History:  Procedure Laterality Date   ABDOMINAL HYSTERECTOMY     BONE MARROW BIOPSY     has had 3 of them but doesnt remember when.  10/30/2021   DILATATION & CURETTAGE/HYSTEROSCOPY WITH MYOSURE N/A 11/18/2021   Procedure: DILATATION & CURETTAGE/HYSTEROSCOPY WITH MYOSURE;  Surgeon: [name], MD;  Location: [place];  Service: Gynecology;  Laterality: N/A;   ECTOPIC PREGNANCY SURGERY  03/02/1982   salpingostomy   ROBOTIC ASSISTED LAPAROSCOPIC LYSIS OF ADHESION N/A 12/24/2021   Procedure: XI ROBOTIC ASSISTED LAPAROSCOPIC LYSIS OF ADHESION;  Surgeon: Lafonda Mosses, MD;  Location: [facility];  Service: Gynecology;  Laterality: N/A;   SENTINEL NODE BIOPSY Bilateral 12/24/2021   Procedure: SENTINEL NODE BIOPSY;  Surgeon: Lafonda Mosses, MD;  Location: [facility];  Service: Gynecology;  Laterality: Bilateral;    Family History  Problem Relation Age of Onset   Heart disease Mother        died age 28   Kidney disease Mother        family hx   Heart attack Father 1       massive   Hypertension Sister    Hypertension Sister    Hypertension Sister    Hypertension Sister    Arthritis Other        family hx   Diabetes Other        family hx   Stroke Other         dialysis   Colon cancer Neg Hx    Breast cancer Neg Hx    Ovarian cancer Neg Hx    Endometrial cancer Neg Hx    Pancreatic cancer Neg Hx    Prostate cancer Neg Hx     Social History   Socioeconomic History   Marital status: Married    Spouse name: Not on file   Number of children: Not on file   Years of education: Not on file   Highest education level: Not on file  Occupational History   Occupation: retired    Occupation: part-time works for her church  Tobacco Use   Smoking status: Never   Smokeless tobacco: Never   Tobacco comments:    never used tobacco  Scientific laboratory technician Use: Never used  Substance and Sexual Activity   Alcohol use: No    Alcohol/week: 0.0 standard drinks of alcohol   Drug use: No   Sexual activity: Not Currently    Partners: Male    Birth control/protection: Post-menopausal  Other Topics Concern   Not on file  Social History Narrative   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   No tobacco no ethoh little caffiene .    Social Determinants of Health   Financial Resource Strain: Low Risk  (01/27/2022)   Overall Financial Resource Strain (CARDIA)    Difficulty of Paying Living Expenses: Not hard at all  Food Insecurity: No Food Insecurity (01/27/2022)   Hunger Vital Sign    Worried About Running Out of Food in the Last Year: Never true    Ran Out of Food in the Last Year: Never true  Transportation Needs: No Transportation Needs (01/27/2022)   PRAPARE - Hydrologist (Medical): No    Lack of Transportation (Non-Medical): No  Physical Activity: Insufficiently Active (01/27/2022)   Exercise Vital Sign    Days of Exercise per Week: 3 days    Minutes of Exercise per Session: 30 min  Stress: No Stress Concern Present (01/27/2022)   [place]    Feeling of Stress : Not at all  Social Connections: [place] (01/27/2022)   Social Connection and Isolation Panel [NHANES]    Frequency of Communication with Friends and Family: More than three times a week    Frequency of Social Gatherings with Friends and Family: More than three times a week    Attends Religious Services: More than 4 times per year    Active Member of [employer] or Organizations: Yes    Attends Music therapist: More than 4 times per year    Marital  Status: Married    Current Medications:  Current Outpatient Medications:    amLODipine (NORVASC) 10 MG tablet, TAKE 1 TABLET BY MOUTH EVERY DAY, Disp: 90 tablet, Rfl: 1   anagrelide (AGRYLIN) 1 MG capsule, Take by mouth., Disp: , Rfl:    aspirin EC 81 MG tablet, Take 81 mg by mouth every evening., Disp: , Rfl:    Fe Fum-FePoly-Vit C-Vit B3 (INTEGRA) 62.5-62.5-40-3 MG CAPS, TAKE 1 CAPSULE BY MOUTH DAILY (Patient taking differently: Take 1 capsule by mouth every evening.), Disp: 30 capsule, Rfl: 8   folic acid (FOLVITE) 1 MG tablet, Take 2 tablets by mouth once daily, Disp: 60 tablet, Rfl: 0   lisinopril (ZESTRIL) 20 MG tablet, TAKE 2 TABLETS BY MOUTH EVERY DAY, Disp: 180 tablet, Rfl: 3   Nebivolol HCl 20 MG TABS, TAKE 1 TABLET(20 MG) BY MOUTH DAILY, Disp: 90 tablet, Rfl: 1   Vitamin D, Ergocalciferol, (DRISDOL) 1.25 MG (50000 UNIT) CAPS capsule, TAKE 1 CAPSULE BY MOUTH 1 TIME A WEEK (Patient taking differently: Take 50,000 Units by mouth every Sunday. TAKE 1 CAPSULE BY MOUTH 1 TIME A WEEK), Disp: 12 capsule, Rfl: 2  Review of Systems: Denies appetite changes, fevers, chills, fatigue, unexplained weight changes. Denies hearing loss, neck lumps or masses, mouth sores, ringing in ears or voice changes. Denies cough or wheezing.  Denies shortness of breath. Denies chest pain or palpitations. Denies leg swelling. Denies abdominal distention, pain, blood in stools, constipation, diarrhea, nausea, vomiting, or early satiety. Denies pain with intercourse, dysuria, frequency, hematuria or incontinence. Denies hot flashes, pelvic pain, vaginal bleeding or vaginal discharge.   Denies joint pain, back pain or muscle pain/cramps. Denies itching, rash, or wounds. Denies dizziness, headaches, numbness or seizures. Denies swollen lymph nodes or glands, denies easy bruising or bleeding. Denies anxiety, depression, confusion, or decreased concentration.  Physical Exam: BP (!) 140/83   Pulse 91   Temp  97.6 F (36.4 C)   Resp 18   Wt 127 lb 6.4 oz (57.8 kg)   SpO2 100%   BMI 17.77 kg/m  General: Alert, oriented, no acute distress. HEENT: Normocephalic, atraumatic, sclera anicteric. Chest: Clear to auscultation bilaterally.  No wheezes or rhonchi. Cardiovascular: Regular rate and rhythm, no murmurs. Abdomen: soft, nontender.  Normoactive bowel sounds.  No masses or hepatosplenomegaly appreciated.  Well-healed incisions. Extremities: Grossly normal range of motion.  Warm, well perfused.  No edema bilaterally. Skin: No rashes or lesions noted. Lymphatics: No cervical, supraclavicular, or inguinal adenopathy.  GU: Normal appearing external genitalia without erythema, excoriation, or lesions.  Speculum exam reveals mildly atrophic vaginal mucosa.  Cuff is intact, mild discoloration along the entire prior suture line, no masses or atypical vascularity.  Bimanual exam reveals cuff intact, no nodularity.  Rectovaginal exam confirms findings, no masses palpated.  Laboratory & Radiologic Studies: None new  Assessment & Plan: SYRAI GLADWIN is a 72 y.o. 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  Tumor confined to endometrium, negative washings. MMR intact, MS-stable, Her 2 +.   The patient is doing very well, is NED on exam today.  We will repeat a CA125 -the patient requested that this be drawn at the time of her labs with her medical oncologist later this morning.  I reached out to him to ask if this could be added.  In the setting of her high risk cancer with decision not to pursue any adjuvant treatment, we will plan to get a CT scan 6 months following her surgery.  This was ordered today.  Per NCCN surveillance recommendations, in the setting of high risk disease, we will continue with visits every 3 months for the first 2-3 years before transitioning to 39-monthsurveillance visits.  We again reviewed signs and  symptoms that would be concerning for cancer recurrence and I stressed the importance of calling if she develops any of these before her next visit.  20 minutes of total time was spent for this patient encounter, including preparation, face-to-face counseling with the patient and coordination of care, and documentation of the encounter.  KJeral Pinch MD  Division of Gynecologic Oncology  Department of Obstetrics and Gynecology  [facility]

## 2022-04-03 ENCOUNTER — Inpatient Hospital Stay (HOSPITAL_BASED_OUTPATIENT_CLINIC_OR_DEPARTMENT_OTHER): Payer: Medicare PPO | Admitting: Hematology & Oncology

## 2022-04-03 ENCOUNTER — Encounter: Payer: Self-pay | Admitting: Hematology & Oncology

## 2022-04-03 ENCOUNTER — Inpatient Hospital Stay: Payer: Medicare PPO

## 2022-04-03 ENCOUNTER — Other Ambulatory Visit: Payer: Self-pay

## 2022-04-03 ENCOUNTER — Ambulatory Visit: Payer: Medicare PPO

## 2022-04-03 ENCOUNTER — Encounter: Payer: Self-pay | Admitting: Gynecologic Oncology

## 2022-04-03 ENCOUNTER — Inpatient Hospital Stay: Payer: Medicare PPO | Attending: Hematology & Oncology | Admitting: Gynecologic Oncology

## 2022-04-03 VITALS — BP 143/75 | HR 85 | Temp 97.9°F | Resp 20 | Ht 71.0 in | Wt 127.1 lb

## 2022-04-03 VITALS — BP 140/83 | HR 91 | Temp 97.6°F | Resp 18 | Wt 127.4 lb

## 2022-04-03 DIAGNOSIS — Z79899 Other long term (current) drug therapy: Secondary | ICD-10-CM | POA: Insufficient documentation

## 2022-04-03 DIAGNOSIS — C549 Malignant neoplasm of corpus uteri, unspecified: Secondary | ICD-10-CM

## 2022-04-03 DIAGNOSIS — D471 Chronic myeloproliferative disease: Secondary | ICD-10-CM

## 2022-04-03 DIAGNOSIS — D473 Essential (hemorrhagic) thrombocythemia: Secondary | ICD-10-CM

## 2022-04-03 DIAGNOSIS — Z90710 Acquired absence of both cervix and uterus: Secondary | ICD-10-CM | POA: Insufficient documentation

## 2022-04-03 DIAGNOSIS — C541 Malignant neoplasm of endometrium: Secondary | ICD-10-CM | POA: Diagnosis not present

## 2022-04-03 DIAGNOSIS — D649 Anemia, unspecified: Secondary | ICD-10-CM | POA: Insufficient documentation

## 2022-04-03 DIAGNOSIS — Z90722 Acquired absence of ovaries, bilateral: Secondary | ICD-10-CM | POA: Diagnosis not present

## 2022-04-03 DIAGNOSIS — Z7982 Long term (current) use of aspirin: Secondary | ICD-10-CM | POA: Insufficient documentation

## 2022-04-03 LAB — CMP (CANCER CENTER ONLY)
ALT: 13 U/L (ref 0–44)
AST: 17 U/L (ref 15–41)
Albumin: 4.4 g/dL (ref 3.5–5.0)
Alkaline Phosphatase: 86 U/L (ref 38–126)
Anion gap: 9 (ref 5–15)
BUN: 34 mg/dL — ABNORMAL HIGH (ref 8–23)
CO2: 29 mmol/L (ref 22–32)
Calcium: 9.9 mg/dL (ref 8.9–10.3)
Chloride: 104 mmol/L (ref 98–111)
Creatinine: 1.26 mg/dL — ABNORMAL HIGH (ref 0.44–1.00)
GFR, Estimated: 46 mL/min — ABNORMAL LOW (ref >60)
Glucose, Bld: 112 mg/dL — ABNORMAL HIGH (ref 70–99)
Potassium: 4 mmol/L (ref 3.5–5.1)
Sodium: 142 mmol/L (ref 135–145)
Total Bilirubin: 0.6 mg/dL (ref 0.3–1.2)
Total Protein: 7.9 g/dL (ref 6.5–8.1)

## 2022-04-03 LAB — CBC WITH DIFFERENTIAL (CANCER CENTER ONLY)
Abs Immature Granulocytes: 0.11 10*3/uL — ABNORMAL HIGH (ref 0.00–0.07)
Basophils Absolute: 0.1 10*3/uL (ref 0.0–0.1)
Basophils Relative: 1 %
Eosinophils Absolute: 0.1 10*3/uL (ref 0.0–0.5)
Eosinophils Relative: 2 %
HCT: 26.3 % — ABNORMAL LOW (ref 36.0–46.0)
Hemoglobin: 8 g/dL — ABNORMAL LOW (ref 12.0–15.0)
Immature Granulocytes: 2 %
Lymphocytes Relative: 18 %
Lymphs Abs: 1.2 10*3/uL (ref 0.7–4.0)
MCH: 25.1 pg — ABNORMAL LOW (ref 26.0–34.0)
MCHC: 30.4 g/dL (ref 30.0–36.0)
MCV: 82.4 fL (ref 80.0–100.0)
Monocytes Absolute: 0.7 10*3/uL (ref 0.1–1.0)
Monocytes Relative: 10 %
Neutro Abs: 4.6 10*3/uL (ref 1.7–7.7)
Neutrophils Relative %: 67 %
Platelet Count: 307 10*3/uL (ref 150–400)
RBC: 3.19 MIL/uL — ABNORMAL LOW (ref 3.87–5.11)
RDW: 18.7 % — ABNORMAL HIGH (ref 11.5–15.5)
WBC Count: 6.8 10*3/uL (ref 4.0–10.5)
nRBC: 0 % (ref 0.0–0.2)

## 2022-04-03 LAB — LACTATE DEHYDROGENASE: LDH: 219 U/L — ABNORMAL HIGH (ref 98–192)

## 2022-04-03 LAB — RETICULOCYTES
Immature Retic Fract: 16.9 % — ABNORMAL HIGH (ref 2.3–15.9)
RBC.: 3.26 MIL/uL — ABNORMAL LOW (ref 3.87–5.11)
Retic Count, Absolute: 33.3 10*3/uL (ref 19.0–186.0)
Retic Ct Pct: 1 % (ref 0.4–3.1)

## 2022-04-03 LAB — FERRITIN: Ferritin: 240 ng/mL (ref 11–307)

## 2022-04-03 NOTE — Patient Instructions (Signed)
It was good to see you today.  I do not see or feel any evidence of cancer recurrence on your exam.  I will see you for follow-up in 3 months. We will plan to get a CT scan at that time.  As always, if you develop any new and concerning symptoms before your next visit, please call to see me sooner.

## 2022-04-03 NOTE — Progress Notes (Signed)
Plan Alexandra [name]  It is a [employer].  Hematology and Oncology Follow Up Visit  Alexandra [name] [id number] April 17, 1950 72 y.o. 04/03/2022   Principle Diagnosis:  Essential thrombocythemia -Calreticulin (+)  High-grade serous carcinoma of the uterus  -- stage 1 (T1aN0M0) Anemia of erythropoietin deficiency   Current Therapy:   Anagrelide 5 mg p.o. daily  Folic acid 2 mg p.o. daily  Aspirin 81 mg p.o. daily  Procrit 40,000 units SQ weekly for hemoglobin less than 10-patient declined to date S/p TAH-BSO -- 12/24/2021  Interim History:  [name] is back for followup.  Since her last saw her, she underwent surgery because of a uterine cancer.  She had a laparoscopic procedure on 12/24/2021.  Alexandra [name], as always, did a very thorough job.  The pathology report [id number]) showed a high-grade endometrial carcinoma.  This was confined to the endometrium.  There were 3 lymph nodes that were all negative.  She had a peritoneal implant that was removed again this was negative.  Omentum did not show any disease.  The tumor was MMR proficient.  She did not wish to have any adjuvant radiation therapy.  She saw Alexandra [name] today.  We will see what the CA-125 looks like.  I think she will be due for some scans in 3 months.  She has had no problems with cough or shortness of breath.  She is trying to walk a little bit more.  She has had little bit of abdominal pain but no problems with bowels or bladder.  She has had no fever.  She has had no rashes.  There is been no leg swelling.  She is doing well with the anagrelide.  She also is on folic acid and aspirin.  Currently, I would say that her performance status is probably ECOG 1.  Medications:  Current Outpatient Medications:    amLODipine (NORVASC) 10 MG tablet, TAKE 1 TABLET BY MOUTH EVERY DAY, Disp: 90 tablet, Rfl: 1   anagrelide (AGRYLIN) 1 MG capsule, Take by mouth., Disp: , Rfl:    aspirin EC 81 MG tablet, Take 81 mg by mouth every  evening., Disp: , Rfl:    Fe Fum-FePoly-Vit C-Vit B3 (INTEGRA) 62.5-62.5-40-3 MG CAPS, TAKE 1 CAPSULE BY MOUTH DAILY (Patient taking differently: Take 1 capsule by mouth every evening.), Disp: 30 capsule, Rfl: 8   folic acid (FOLVITE) 1 MG tablet, Take 2 tablets by mouth once daily, Disp: 60 tablet, Rfl: 0   lisinopril (ZESTRIL) 20 MG tablet, TAKE 2 TABLETS BY MOUTH EVERY DAY, Disp: 180 tablet, Rfl: 3   Nebivolol HCl 20 MG TABS, TAKE 1 TABLET(20 MG) BY MOUTH DAILY, Disp: 90 tablet, Rfl: 1   Vitamin D, Ergocalciferol, (DRISDOL) 1.25 MG (50000 UNIT) CAPS capsule, TAKE 1 CAPSULE BY MOUTH 1 TIME A WEEK (Patient taking differently: Take 50,000 Units by mouth every Sunday. TAKE 1 CAPSULE BY MOUTH 1 TIME A WEEK), Disp: 12 capsule, Rfl: 2  Allergies: No Known Allergies  Past Medical History, Surgical history, Social history, and Family History were reviewed and updated.  Review of Systems: Review of Systems  All other systems reviewed and are negative. Marland Kitchen  Physical Exam:  height is '5\' 11"'$  (1.803 m) and weight is 127 lb 1.3 oz (57.6 kg). Her oral temperature is 97.9 F (36.6 C). Her blood pressure is 143/75 (abnormal) and her pulse is 85. Her respiration is 20 and oxygen saturation is 100%. =jh  Physical Exam Vitals reviewed.  HENT:  Head: Normocephalic and atraumatic.  Eyes:     Pupils: Pupils are equal, round, and reactive to light.  Cardiovascular:     Rate and Rhythm: Normal rate and regular rhythm.     Heart sounds: Normal heart sounds.  Pulmonary:     Effort: Pulmonary effort is normal.     Breath sounds: Normal breath sounds.  Abdominal:     General: Bowel sounds are normal.     Palpations: Abdomen is soft.     Comments: Abdominal exam shows well-healed laparoscopic scars.  There is no fluid wave.  There is no guarding or rebound tenderness.  There is no palpable hepatosplenomegaly.  Musculoskeletal:        General: No tenderness or deformity. Normal range of motion.      Cervical back: Normal range of motion.  Lymphadenopathy:     Cervical: No cervical adenopathy.  Skin:    General: Skin is warm and dry.     Findings: No erythema or rash.  Neurological:     Mental Status: She is alert and oriented to person, place, and time.  Psychiatric:        Behavior: Behavior normal.        Thought Content: Thought content normal.        Judgment: Judgment normal.      Lab Results  Component Value Date   WBC 6.8 04/03/2022   HGB 8.0 (L) 04/03/2022   HCT 26.3 (L) 04/03/2022   MCV 82.4 04/03/2022   PLT 307 04/03/2022     Chemistry      Component Value Date/Time   NA 142 04/03/2022 0959   NA 144 02/02/2017 1158   NA 140 07/24/2016 1007   K 4.0 04/03/2022 0959   K 3.7 02/02/2017 1158   K 3.6 07/24/2016 1007   CL 104 04/03/2022 0959   CL 103 02/02/2017 1158   CO2 29 04/03/2022 0959   CO2 29 02/02/2017 1158   CO2 26 07/24/2016 1007   BUN 34 (H) 04/03/2022 0959   BUN 20 02/02/2017 1158   BUN 30.3 (H) 07/24/2016 1007   CREATININE 1.26 (H) 04/03/2022 0959   CREATININE 0.9 02/02/2017 1158   CREATININE 1.1 07/24/2016 1007   GLU 129 02/02/2017 0000      Component Value Date/Time   CALCIUM 9.9 04/03/2022 0959   CALCIUM 9.0 02/02/2017 1158   CALCIUM 9.3 07/24/2016 1007   ALKPHOS 86 04/03/2022 0959   ALKPHOS 110 (H) 02/02/2017 1158   ALKPHOS 104 07/24/2016 1007   AST 17 04/03/2022 0959   AST 13 07/24/2016 1007   ALT 13 04/03/2022 0959   ALT 26 02/02/2017 1158   ALT 15 07/24/2016 1007   BILITOT 0.6 04/03/2022 0959   BILITOT 0.55 07/24/2016 1007      Impression and Plan: Alexandra [name] is 72 year old [name]. She has essential thrombocythemia. She actually is Calreticulin positive.  Thankfully, she has a very early stage endometrial carcinoma.  She is followed by Alexandra [name] for this.  Alexandra [name] is a Alexandra [name] will get a CAT scan about 3 months.  I do not think she needs any Procrit.  Again, she got Procrit just so that we could get  her hemoglobin little bit better for her surgery.  She is always try to avoid Procrit before she needed surgery.  She is doing well with a hemoglobin of 8-9.  Her [birth year] is in early [birth year].  She may go to the beach for her birthday.  At  this point, we will plan to get her back to see [place] in about 6 weeks or so.     [name], MD 2/2/202411:03 AM

## 2022-04-05 LAB — CA 125: Cancer Antigen (CA) 125: 7.2 U/mL (ref 0.0–38.1)

## 2022-04-06 ENCOUNTER — Telehealth: Payer: Self-pay

## 2022-04-06 NOTE — Telephone Encounter (Signed)
-----   Message from [name], MD sent at 04/06/2022  5:59 AM EST ----- Call - the cancer marker is normal -- it is only 7.2!!!   Laurey Arrow

## 2022-04-24 ENCOUNTER — Other Ambulatory Visit: Payer: Self-pay | Admitting: Hematology & Oncology

## 2022-04-24 DIAGNOSIS — D508 Other iron deficiency anemias: Secondary | ICD-10-CM

## 2022-04-24 DIAGNOSIS — D473 Essential (hemorrhagic) thrombocythemia: Secondary | ICD-10-CM

## 2022-05-01 ENCOUNTER — Other Ambulatory Visit: Payer: Self-pay | Admitting: Hematology & Oncology

## 2022-05-01 DIAGNOSIS — E559 Vitamin D deficiency, unspecified: Secondary | ICD-10-CM

## 2022-05-15 ENCOUNTER — Inpatient Hospital Stay (HOSPITAL_BASED_OUTPATIENT_CLINIC_OR_DEPARTMENT_OTHER): Payer: Medicare PPO | Admitting: Hematology & Oncology

## 2022-05-15 ENCOUNTER — Encounter: Payer: Self-pay | Admitting: Hematology & Oncology

## 2022-05-15 ENCOUNTER — Inpatient Hospital Stay: Payer: Medicare PPO | Attending: Hematology & Oncology

## 2022-05-15 VITALS — BP 142/76 | HR 76 | Temp 97.4°F | Resp 20 | Ht 71.0 in | Wt 128.0 lb

## 2022-05-15 DIAGNOSIS — Z90722 Acquired absence of ovaries, bilateral: Secondary | ICD-10-CM | POA: Insufficient documentation

## 2022-05-15 DIAGNOSIS — Z7982 Long term (current) use of aspirin: Secondary | ICD-10-CM | POA: Insufficient documentation

## 2022-05-15 DIAGNOSIS — D631 Anemia in chronic kidney disease: Secondary | ICD-10-CM | POA: Insufficient documentation

## 2022-05-15 DIAGNOSIS — C541 Malignant neoplasm of endometrium: Secondary | ICD-10-CM | POA: Diagnosis not present

## 2022-05-15 DIAGNOSIS — N189 Chronic kidney disease, unspecified: Secondary | ICD-10-CM | POA: Insufficient documentation

## 2022-05-15 DIAGNOSIS — C549 Malignant neoplasm of corpus uteri, unspecified: Secondary | ICD-10-CM

## 2022-05-15 DIAGNOSIS — D473 Essential (hemorrhagic) thrombocythemia: Secondary | ICD-10-CM | POA: Diagnosis not present

## 2022-05-15 DIAGNOSIS — Z90710 Acquired absence of both cervix and uterus: Secondary | ICD-10-CM | POA: Insufficient documentation

## 2022-05-15 DIAGNOSIS — Z9079 Acquired absence of other genital organ(s): Secondary | ICD-10-CM | POA: Insufficient documentation

## 2022-05-15 DIAGNOSIS — Z79899 Other long term (current) drug therapy: Secondary | ICD-10-CM | POA: Insufficient documentation

## 2022-05-15 LAB — CBC WITH DIFFERENTIAL (CANCER CENTER ONLY)
Abs Immature Granulocytes: 0.07 10*3/uL (ref 0.00–0.07)
Basophils Absolute: 0 10*3/uL (ref 0.0–0.1)
Basophils Relative: 1 %
Eosinophils Absolute: 0.1 10*3/uL (ref 0.0–0.5)
Eosinophils Relative: 2 %
HCT: 24.6 % — ABNORMAL LOW (ref 36.0–46.0)
Hemoglobin: 7.6 g/dL — ABNORMAL LOW (ref 12.0–15.0)
Immature Granulocytes: 1 %
Lymphocytes Relative: 26 %
Lymphs Abs: 1.7 10*3/uL (ref 0.7–4.0)
MCH: 25.2 pg — ABNORMAL LOW (ref 26.0–34.0)
MCHC: 30.9 g/dL (ref 30.0–36.0)
MCV: 81.7 fL (ref 80.0–100.0)
Monocytes Absolute: 0.7 10*3/uL (ref 0.1–1.0)
Monocytes Relative: 11 %
Neutro Abs: 3.9 10*3/uL (ref 1.7–7.7)
Neutrophils Relative %: 59 %
Platelet Count: 325 10*3/uL (ref 150–400)
RBC: 3.01 MIL/uL — ABNORMAL LOW (ref 3.87–5.11)
RDW: 19.7 % — ABNORMAL HIGH (ref 11.5–15.5)
WBC Count: 6.5 10*3/uL (ref 4.0–10.5)
nRBC: 0 % (ref 0.0–0.2)

## 2022-05-15 LAB — CMP (CANCER CENTER ONLY)
ALT: 13 U/L (ref 0–44)
AST: 15 U/L (ref 15–41)
Albumin: 4.3 g/dL (ref 3.5–5.0)
Alkaline Phosphatase: 82 U/L (ref 38–126)
Anion gap: 7 (ref 5–15)
BUN: 36 mg/dL — ABNORMAL HIGH (ref 8–23)
CO2: 28 mmol/L (ref 22–32)
Calcium: 9.4 mg/dL (ref 8.9–10.3)
Chloride: 106 mmol/L (ref 98–111)
Creatinine: 1.2 mg/dL — ABNORMAL HIGH (ref 0.44–1.00)
GFR, Estimated: 48 mL/min — ABNORMAL LOW (ref >60)
Glucose, Bld: 117 mg/dL — ABNORMAL HIGH (ref 70–99)
Potassium: 4.1 mmol/L (ref 3.5–5.1)
Sodium: 141 mmol/L (ref 135–145)
Total Bilirubin: 0.7 mg/dL (ref 0.3–1.2)
Total Protein: 7.3 g/dL (ref 6.5–8.1)

## 2022-05-15 LAB — IRON AND IRON BINDING CAPACITY (CC-WL,HP ONLY)
Iron: 84 ug/dL (ref 28–170)
Saturation Ratios: 33 % — ABNORMAL HIGH (ref 10.4–31.8)
TIBC: 256 ug/dL (ref 250–450)
UIBC: 172 ug/dL (ref 148–442)

## 2022-05-15 LAB — RETICULOCYTES
Immature Retic Fract: 15.1 % (ref 2.3–15.9)
RBC.: 3 MIL/uL — ABNORMAL LOW (ref 3.87–5.11)
Retic Count, Absolute: 37.2 10*3/uL (ref 19.0–186.0)
Retic Ct Pct: 1.2 % (ref 0.4–3.1)

## 2022-05-15 LAB — LACTATE DEHYDROGENASE: LDH: 200 U/L — ABNORMAL HIGH (ref 98–192)

## 2022-05-15 LAB — SAVE SMEAR(SSMR), FOR PROVIDER SLIDE REVIEW

## 2022-05-15 LAB — FERRITIN: Ferritin: 241 ng/mL (ref 11–307)

## 2022-05-15 NOTE — Progress Notes (Signed)
[name]  It is a [employer].  Hematology and Oncology Follow Up Visit  Alexandra [name] [id number] 03-Feb-1951 72 y.o. 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 daily  Folic acid 2 mg p.o. daily  Aspirin 81 mg p.o. daily  Procrit 40,000 units SQ weekly for hemoglobin less than 10-patient declined to date S/p TAH-BSO -- 12/24/2021  Interim History:  [name] is back for followup.  She seems to be doing pretty well.  She had a good time down to [facility].  She is down there I think a couple weeks ends ago.  She has had no problems with abdominal pain.  She sees her gynecologic oncologist I think in May.  She has had no problems with bowels or bladder.  She has had no nausea or vomiting.  She has had no cough or shortness of breath.  There is been no leg swelling.  She has had no rashes.  Of note, her last CA 125 was 7.2 in early February.  She has had no issues with fever.  She has had no obvious bleeding.  There is been no headache.  Overall, her performance status is ECOG 1.  Medications:  Current Outpatient Medications:    amLODipine (NORVASC) 10 MG tablet, TAKE 1 TABLET BY MOUTH EVERY DAY, Disp: 90 tablet, Rfl: 1   anagrelide (AGRYLIN) 1 MG capsule, Take by mouth., Disp: , Rfl:    aspirin EC 81 MG tablet, Take 81 mg by mouth every evening., Disp: , Rfl:    Fe Fum-FePoly-Vit C-Vit B3 (INTEGRA) 62.5-62.5-40-3 MG CAPS, TAKE 1 CAPSULE BY MOUTH DAILY (Patient taking differently: Take 1 capsule by mouth every evening.), Disp: 30 capsule, Rfl: 8   folic acid (FOLVITE) 1 MG tablet, Take 2 tablets by mouth once daily, Disp: 60 tablet, Rfl: 0   lisinopril (ZESTRIL) 20 MG tablet, TAKE 2 TABLETS BY MOUTH EVERY DAY, Disp: 180 tablet, Rfl: 3   Nebivolol HCl 20 MG TABS, TAKE 1 TABLET(20 MG) BY MOUTH  DAILY, Disp: 90 tablet, Rfl: 1   Vitamin D, Ergocalciferol, (DRISDOL) 1.25 MG (50000 UNIT) CAPS capsule, TAKE 1 CAPSULE BY MOUTH 1 TIME A WEEK, Disp: 12 capsule, Rfl: 2  Allergies: No Known Allergies  Past Medical History, Surgical history, Social history, and Family History were reviewed and updated.  Review of Systems: Review of Systems  All other systems reviewed and are negative. Marland Kitchen  Physical Exam:  height is 5\' 11"  (1.803 m) and weight is 128 lb (58.1 kg). Her oral temperature is 97.4 F (36.3 C) (abnormal). Her blood pressure is 142/76 (abnormal) and her pulse is 76. Her respiration is 20. =jh  Physical Exam Vitals reviewed.  HENT:     Head: Normocephalic and atraumatic.  Eyes:     Pupils: Pupils are equal, round, and reactive to light.  Cardiovascular:     Rate and Rhythm: Normal rate and regular rhythm.     Heart sounds: Normal heart sounds.  Pulmonary:     Effort: Pulmonary effort is normal.     Breath sounds: Normal breath sounds.  Abdominal:     General: Bowel sounds are normal.     Palpations: Abdomen is soft.     Comments: Abdominal exam shows well-healed laparoscopic scars.  There is no fluid wave.  There is no guarding or  rebound tenderness.  There is no palpable hepatosplenomegaly.  Musculoskeletal:        General: No tenderness or deformity. Normal range of motion.     Cervical back: Normal range of motion.  Lymphadenopathy:     Cervical: No cervical adenopathy.  Skin:    General: Skin is warm and dry.     Findings: No erythema or rash.  Neurological:     Mental Status: She is alert and oriented to person, place, and time.  Psychiatric:        Behavior: Behavior normal.        Thought Content: Thought content normal.        Judgment: Judgment normal.      Lab Results  Component Value Date   WBC 6.5 05/15/2022   HGB 7.6 (L) 05/15/2022   HCT 24.6 (L) 05/15/2022   MCV 81.7 05/15/2022   PLT 325 05/15/2022     Chemistry      Component Value  Date/Time   NA 141 05/15/2022 0852   NA 144 02/02/2017 1158   NA 140 07/24/2016 1007   K 4.1 05/15/2022 0852   K 3.7 02/02/2017 1158   K 3.6 07/24/2016 1007   CL 106 05/15/2022 0852   CL 103 02/02/2017 1158   CO2 28 05/15/2022 0852   CO2 29 02/02/2017 1158   CO2 26 07/24/2016 1007   BUN 36 (H) 05/15/2022 0852   BUN 20 02/02/2017 1158   BUN 30.3 (H) 07/24/2016 1007   CREATININE 1.20 (H) 05/15/2022 0852   CREATININE 0.9 02/02/2017 1158   CREATININE 1.1 07/24/2016 1007   GLU 129 02/02/2017 0000      Component Value Date/Time   CALCIUM 9.4 05/15/2022 0852   CALCIUM 9.0 02/02/2017 1158   CALCIUM 9.3 07/24/2016 1007   ALKPHOS 82 05/15/2022 0852   ALKPHOS 110 (H) 02/02/2017 1158   ALKPHOS 104 07/24/2016 1007   AST 15 05/15/2022 0852   AST 13 07/24/2016 1007   ALT 13 05/15/2022 0852   ALT 26 02/02/2017 1158   ALT 15 07/24/2016 1007   BILITOT 0.7 05/15/2022 0852   BILITOT 0.55 07/24/2016 1007      Impression and Plan: Alexandra [name] is 72 year old [name]. She has essential thrombocythemia. She actually is Calreticulin positive.  Everything looks fine with her platelet count.  As always, she is quite anemic.  However, she is not symptomatic with her anemia.  Her endometrial cancer also should not be a problem for her.  I know that Dr. Berline Lopes is following this.  I would think that another CT scan probably will be done in May.  We will plan to get her back in another 6-8 weeks.     [name], MD 3/15/202410:00 AM

## 2022-05-16 LAB — CA 125: Cancer Antigen (CA) 125: 7.1 U/mL (ref 0.0–38.1)

## 2022-06-04 ENCOUNTER — Other Ambulatory Visit: Payer: Self-pay | Admitting: Hematology & Oncology

## 2022-06-04 DIAGNOSIS — D473 Essential (hemorrhagic) thrombocythemia: Secondary | ICD-10-CM

## 2022-06-04 DIAGNOSIS — D508 Other iron deficiency anemias: Secondary | ICD-10-CM

## 2022-06-05 ENCOUNTER — Encounter: Payer: Self-pay | Admitting: Family

## 2022-06-30 ENCOUNTER — Telehealth: Payer: Self-pay | Admitting: *Deleted

## 2022-06-30 NOTE — Telephone Encounter (Signed)
Patient called and rescheduled her MD and CT appts. MD appt moved to 6/7. Patient to call and reschedule her CT scan

## 2022-07-02 ENCOUNTER — Ambulatory Visit (HOSPITAL_COMMUNITY): Payer: Medicare PPO

## 2022-07-03 ENCOUNTER — Inpatient Hospital Stay (HOSPITAL_BASED_OUTPATIENT_CLINIC_OR_DEPARTMENT_OTHER): Payer: Medicare PPO | Admitting: Hematology & Oncology

## 2022-07-03 ENCOUNTER — Inpatient Hospital Stay: Payer: Medicare PPO | Attending: Hematology & Oncology

## 2022-07-03 ENCOUNTER — Encounter: Payer: Self-pay | Admitting: Hematology & Oncology

## 2022-07-03 VITALS — BP 136/77 | HR 80 | Temp 97.7°F | Resp 20 | Ht 71.0 in | Wt 129.1 lb

## 2022-07-03 DIAGNOSIS — Z90710 Acquired absence of both cervix and uterus: Secondary | ICD-10-CM | POA: Insufficient documentation

## 2022-07-03 DIAGNOSIS — N189 Chronic kidney disease, unspecified: Secondary | ICD-10-CM | POA: Insufficient documentation

## 2022-07-03 DIAGNOSIS — C541 Malignant neoplasm of endometrium: Secondary | ICD-10-CM | POA: Insufficient documentation

## 2022-07-03 DIAGNOSIS — Z79899 Other long term (current) drug therapy: Secondary | ICD-10-CM | POA: Insufficient documentation

## 2022-07-03 DIAGNOSIS — D473 Essential (hemorrhagic) thrombocythemia: Secondary | ICD-10-CM

## 2022-07-03 DIAGNOSIS — Z9079 Acquired absence of other genital organ(s): Secondary | ICD-10-CM | POA: Insufficient documentation

## 2022-07-03 DIAGNOSIS — D631 Anemia in chronic kidney disease: Secondary | ICD-10-CM | POA: Diagnosis not present

## 2022-07-03 DIAGNOSIS — Z7982 Long term (current) use of aspirin: Secondary | ICD-10-CM | POA: Insufficient documentation

## 2022-07-03 DIAGNOSIS — Z90722 Acquired absence of ovaries, bilateral: Secondary | ICD-10-CM | POA: Diagnosis not present

## 2022-07-03 DIAGNOSIS — C549 Malignant neoplasm of corpus uteri, unspecified: Secondary | ICD-10-CM

## 2022-07-03 LAB — CBC WITH DIFFERENTIAL (CANCER CENTER ONLY)
Abs Immature Granulocytes: 0.03 10*3/uL (ref 0.00–0.07)
Basophils Absolute: 0.1 10*3/uL (ref 0.0–0.1)
Basophils Relative: 1 %
Eosinophils Absolute: 0.2 10*3/uL (ref 0.0–0.5)
Eosinophils Relative: 2 %
HCT: 26.2 % — ABNORMAL LOW (ref 36.0–46.0)
Hemoglobin: 8.2 g/dL — ABNORMAL LOW (ref 12.0–15.0)
Immature Granulocytes: 1 %
Lymphocytes Relative: 25 %
Lymphs Abs: 1.6 10*3/uL (ref 0.7–4.0)
MCH: 24.9 pg — ABNORMAL LOW (ref 26.0–34.0)
MCHC: 31.3 g/dL (ref 30.0–36.0)
MCV: 79.6 fL — ABNORMAL LOW (ref 80.0–100.0)
Monocytes Absolute: 0.6 10*3/uL (ref 0.1–1.0)
Monocytes Relative: 10 %
Neutro Abs: 3.8 10*3/uL (ref 1.7–7.7)
Neutrophils Relative %: 61 %
Platelet Count: 358 10*3/uL (ref 150–400)
RBC: 3.29 MIL/uL — ABNORMAL LOW (ref 3.87–5.11)
RDW: 18.7 % — ABNORMAL HIGH (ref 11.5–15.5)
WBC Count: 6.2 10*3/uL (ref 4.0–10.5)
nRBC: 0 % (ref 0.0–0.2)

## 2022-07-03 LAB — CMP (CANCER CENTER ONLY)
ALT: 11 U/L (ref 0–44)
AST: 14 U/L — ABNORMAL LOW (ref 15–41)
Albumin: 4.1 g/dL (ref 3.5–5.0)
Alkaline Phosphatase: 85 U/L (ref 38–126)
Anion gap: 9 (ref 5–15)
BUN: 38 mg/dL — ABNORMAL HIGH (ref 8–23)
CO2: 28 mmol/L (ref 22–32)
Calcium: 9.3 mg/dL (ref 8.9–10.3)
Chloride: 105 mmol/L (ref 98–111)
Creatinine: 1.25 mg/dL — ABNORMAL HIGH (ref 0.44–1.00)
GFR, Estimated: 46 mL/min — ABNORMAL LOW (ref >60)
Glucose, Bld: 100 mg/dL — ABNORMAL HIGH (ref 70–99)
Potassium: 3.4 mmol/L — ABNORMAL LOW (ref 3.5–5.1)
Sodium: 142 mmol/L (ref 135–145)
Total Bilirubin: 0.5 mg/dL (ref 0.3–1.2)
Total Protein: 7.6 g/dL (ref 6.5–8.1)

## 2022-07-03 LAB — FERRITIN: Ferritin: 198 ng/mL (ref 11–307)

## 2022-07-03 LAB — RETICULOCYTES
Immature Retic Fract: 18.6 % — ABNORMAL HIGH (ref 2.3–15.9)
RBC.: 3.27 MIL/uL — ABNORMAL LOW (ref 3.87–5.11)
Retic Count, Absolute: 30.1 10*3/uL (ref 19.0–186.0)
Retic Ct Pct: 0.9 % (ref 0.4–3.1)

## 2022-07-03 LAB — IRON AND IRON BINDING CAPACITY (CC-WL,HP ONLY)
Iron: 79 ug/dL (ref 28–170)
Saturation Ratios: 30 % (ref 10.4–31.8)
TIBC: 260 ug/dL (ref 250–450)
UIBC: 181 ug/dL (ref 148–442)

## 2022-07-03 LAB — LACTATE DEHYDROGENASE: LDH: 196 U/L — ABNORMAL HIGH (ref 98–192)

## 2022-07-03 NOTE — Progress Notes (Signed)
Plan Alexandra [name]  It is a [employer].  Hematology and Oncology Follow Up Visit  Alexandra [name] [id number] 04-Aug-1950 72 y.o. 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 daily  Folic acid 2 mg p.o. daily  Aspirin 81 mg p.o. daily  Procrit 40,000 units SQ weekly for hemoglobin less than 10-patient declined to date S/p TAH-BSO -- 12/24/2021  Interim History:  Alexandra [name] is back for followup.  So far, she is doing quite nicely.  We last saw her back in March.  She had a wonderful Easter.  She is looking forward to a nice May and June.  As always, she travels and goes to reunions and sees her family.  She has recovered nicely from her surgery for the uterine cancer.  I think she still follows up with the Gynecologic Oncologist.  Her last CA-125 was 7.1.  She has had no problems with nausea or vomiting.  She has had no change in bowel or bladder habits.  There is been no bleeding.  She has had no leg swelling.  There has been no rashes.  She has had no cough or shortness of breath.  She has had no headache.  She is trying to get back into walking.  I know that she we will continue to be incredibly motivated to stay healthy.  Overall, I would have said that her performance status is probably ECOG 1. .  Medications:  Current Outpatient Medications:    amLODipine (NORVASC) 10 MG tablet, TAKE 1 TABLET BY MOUTH EVERY DAY, Disp: 90 tablet, Rfl: 1   anagrelide (AGRYLIN) 1 MG capsule, Take by mouth., Disp: , Rfl:    aspirin EC 81 MG tablet, Take 81 mg by mouth every evening., Disp: , Rfl:    Fe Fum-FePoly-Vit C-Vit B3 (INTEGRA) 62.5-62.5-40-3 MG CAPS, TAKE 1 CAPSULE BY MOUTH DAILY (Patient taking differently: Take 1 capsule by mouth every evening.), Disp: 30 capsule, Rfl: 8   folic acid (FOLVITE) 1 MG tablet, Take 2  tablets by mouth once daily, Disp: 60 tablet, Rfl: 0   lisinopril (ZESTRIL) 20 MG tablet, TAKE 2 TABLETS BY MOUTH EVERY DAY, Disp: 180 tablet, Rfl: 3   Nebivolol HCl 20 MG TABS, TAKE 1 TABLET(20 MG) BY MOUTH DAILY, Disp: 90 tablet, Rfl: 1   Vitamin D, Ergocalciferol, (DRISDOL) 1.25 MG (50000 UNIT) CAPS capsule, TAKE 1 CAPSULE BY MOUTH 1 TIME A WEEK, Disp: 12 capsule, Rfl: 2  Allergies: No Known Allergies  Past Medical History, Surgical history, Social history, and Family History were reviewed and updated.  Review of Systems: Review of Systems  All other systems reviewed and are negative. Marland Kitchen  Physical Exam:  height is 5\' 11"  (1.803 m) and weight is 129 lb 1.9 oz (58.6 kg). Her oral temperature is 97.7 F (36.5 C). Her blood pressure is 136/77 and her pulse is 80. Her respiration is 20 and oxygen saturation is 100%. =jh  Physical Exam Vitals reviewed.  HENT:     Head: Normocephalic and atraumatic.  Eyes:     Pupils: Pupils are equal, round, and reactive to light.  Cardiovascular:     Rate and Rhythm: Normal rate and regular rhythm.     Heart sounds: Normal heart sounds.  Pulmonary:     Effort: Pulmonary effort is normal.     Breath sounds: Normal  breath sounds.  Abdominal:     General: Bowel sounds are normal.     Palpations: Abdomen is soft.     Comments: Abdominal exam shows well-healed laparoscopic scars.  There is no fluid wave.  There is no guarding or rebound tenderness.  There is no palpable hepatosplenomegaly.  Musculoskeletal:        General: No tenderness or deformity. Normal range of motion.     Cervical back: Normal range of motion.  Lymphadenopathy:     Cervical: No cervical adenopathy.  Skin:    General: Skin is warm and dry.     Findings: No erythema or rash.  Neurological:     Mental Status: She is alert and oriented to person, place, and time.  Psychiatric:        Behavior: Behavior normal.        Thought Content: Thought content normal.        Judgment:  Judgment normal.      Lab Results  Component Value Date   WBC 6.2 07/03/2022   HGB 8.2 (L) 07/03/2022   HCT 26.2 (L) 07/03/2022   MCV 79.6 (L) 07/03/2022   PLT 358 07/03/2022     Chemistry      Component Value Date/Time   NA 141 05/15/2022 0852   NA 144 02/02/2017 1158   NA 140 07/24/2016 1007   K 4.1 05/15/2022 0852   K 3.7 02/02/2017 1158   K 3.6 07/24/2016 1007   CL 106 05/15/2022 0852   CL 103 02/02/2017 1158   CO2 28 05/15/2022 0852   CO2 29 02/02/2017 1158   CO2 26 07/24/2016 1007   BUN 36 (H) 05/15/2022 0852   BUN 20 02/02/2017 1158   BUN 30.3 (H) 07/24/2016 1007   CREATININE 1.20 (H) 05/15/2022 0852   CREATININE 0.9 02/02/2017 1158   CREATININE 1.1 07/24/2016 1007   GLU 129 02/02/2017 0000      Component Value Date/Time   CALCIUM 9.4 05/15/2022 0852   CALCIUM 9.0 02/02/2017 1158   CALCIUM 9.3 07/24/2016 1007   ALKPHOS 82 05/15/2022 0852   ALKPHOS 110 (H) 02/02/2017 1158   ALKPHOS 104 07/24/2016 1007   AST 15 05/15/2022 0852   AST 13 07/24/2016 1007   ALT 13 05/15/2022 0852   ALT 26 02/02/2017 1158   ALT 15 07/24/2016 1007   BILITOT 0.7 05/15/2022 0852   BILITOT 0.55 07/24/2016 1007      Impression and Plan: Alexandra [name] is 72 year old [place] female. She has essential thrombocythemia. She actually is Calreticulin positive.  Her platelet count is trending up slowly.  Will have to watch this closely.  The platelet count is still normal however.  I did look at her blood smear.  I do not see any thing on the blood smear that looked unusual.  She was has some nucleated red blood cells.  She was has some teardrop cells.  The white blood cells appear without hypersegmented polys.  Her platelets are somewhat large.  She has a endometrial cancer.  She had surgery for this.  No she is followed by Gynecologic Oncology.  I would think that they would set her up with scans.  We will plan to get her back in another 6 weeks.     Josph Macho,  MD 5/3/202410:49 AM

## 2022-07-04 LAB — CA 125: Cancer Antigen (CA) 125: 6.3 U/mL (ref 0.0–38.1)

## 2022-07-07 ENCOUNTER — Other Ambulatory Visit: Payer: Self-pay | Admitting: Hematology & Oncology

## 2022-07-07 DIAGNOSIS — D508 Other iron deficiency anemias: Secondary | ICD-10-CM

## 2022-07-07 DIAGNOSIS — D473 Essential (hemorrhagic) thrombocythemia: Secondary | ICD-10-CM

## 2022-07-09 ENCOUNTER — Ambulatory Visit: Payer: Medicare PPO | Admitting: Gynecologic Oncology

## 2022-07-09 DIAGNOSIS — H401121 Primary open-angle glaucoma, left eye, mild stage: Secondary | ICD-10-CM | POA: Diagnosis not present

## 2022-07-09 DIAGNOSIS — H40011 Open angle with borderline findings, low risk, right eye: Secondary | ICD-10-CM | POA: Diagnosis not present

## 2022-07-20 ENCOUNTER — Other Ambulatory Visit (HOSPITAL_COMMUNITY): Payer: Medicare PPO

## 2022-08-01 ENCOUNTER — Other Ambulatory Visit (HOSPITAL_COMMUNITY): Payer: Medicare PPO

## 2022-08-01 ENCOUNTER — Other Ambulatory Visit: Payer: Self-pay | Admitting: Hematology & Oncology

## 2022-08-01 DIAGNOSIS — D508 Other iron deficiency anemias: Secondary | ICD-10-CM

## 2022-08-01 DIAGNOSIS — D473 Essential (hemorrhagic) thrombocythemia: Secondary | ICD-10-CM

## 2022-08-02 ENCOUNTER — Encounter: Payer: Self-pay | Admitting: Family

## 2022-08-07 ENCOUNTER — Ambulatory Visit: Payer: Medicare PPO | Admitting: Gynecologic Oncology

## 2022-08-10 ENCOUNTER — Ambulatory Visit (HOSPITAL_COMMUNITY)
Admission: RE | Admit: 2022-08-10 | Discharge: 2022-08-10 | Disposition: A | Discharge status: Home / Self Care | Payer: Medicare PPO | Source: Ambulatory Visit | Attending: Gynecologic Oncology | Admitting: Gynecologic Oncology

## 2022-08-10 DIAGNOSIS — C55 Malignant neoplasm of uterus, part unspecified: Secondary | ICD-10-CM | POA: Diagnosis not present

## 2022-08-10 DIAGNOSIS — C549 Malignant neoplasm of corpus uteri, unspecified: Secondary | ICD-10-CM

## 2022-08-10 DIAGNOSIS — K7689 Other specified diseases of liver: Secondary | ICD-10-CM | POA: Diagnosis not present

## 2022-08-10 MED ORDER — SODIUM CHLORIDE (PF) 0.9 % IJ SOLN
INTRAMUSCULAR | Status: AC
  Filled 2022-08-10: qty 50

## 2022-08-10 MED ORDER — IOHEXOL 9 MG/ML PO SOLN
500.0000 mL | ORAL | Status: AC
  Administered 2022-08-10: 1000 mL via ORAL

## 2022-08-10 MED ORDER — IOHEXOL 300 MG/ML  SOLN
80.0000 mL | Freq: Once | INTRAMUSCULAR | Status: AC | PRN
  Administered 2022-08-10: 80 mL via INTRAVENOUS

## 2022-08-10 MED ORDER — IOHEXOL 9 MG/ML PO SOLN
ORAL | Status: AC
  Filled 2022-08-10: qty 1000

## 2022-08-12 ENCOUNTER — Encounter: Payer: Self-pay | Admitting: Gynecologic Oncology

## 2022-08-13 ENCOUNTER — Other Ambulatory Visit: Payer: Self-pay

## 2022-08-13 ENCOUNTER — Inpatient Hospital Stay: Payer: Medicare PPO | Attending: Hematology & Oncology | Admitting: Gynecologic Oncology

## 2022-08-13 VITALS — BP 141/83 | HR 90 | Temp 98.3°F | Resp 16 | Ht 71.0 in | Wt 130.0 lb

## 2022-08-13 DIAGNOSIS — Z79899 Other long term (current) drug therapy: Secondary | ICD-10-CM | POA: Insufficient documentation

## 2022-08-13 DIAGNOSIS — Z7982 Long term (current) use of aspirin: Secondary | ICD-10-CM | POA: Diagnosis not present

## 2022-08-13 DIAGNOSIS — Z8542 Personal history of malignant neoplasm of other parts of uterus: Secondary | ICD-10-CM | POA: Diagnosis not present

## 2022-08-13 DIAGNOSIS — Z90710 Acquired absence of both cervix and uterus: Secondary | ICD-10-CM | POA: Diagnosis not present

## 2022-08-13 DIAGNOSIS — I517 Cardiomegaly: Secondary | ICD-10-CM

## 2022-08-13 DIAGNOSIS — Z90722 Acquired absence of ovaries, bilateral: Secondary | ICD-10-CM | POA: Diagnosis not present

## 2022-08-13 DIAGNOSIS — D649 Anemia, unspecified: Secondary | ICD-10-CM | POA: Diagnosis not present

## 2022-08-13 DIAGNOSIS — Z08 Encounter for follow-up examination after completed treatment for malignant neoplasm: Secondary | ICD-10-CM

## 2022-08-13 DIAGNOSIS — C549 Malignant neoplasm of corpus uteri, unspecified: Secondary | ICD-10-CM

## 2022-08-13 DIAGNOSIS — D473 Essential (hemorrhagic) thrombocythemia: Secondary | ICD-10-CM | POA: Diagnosis not present

## 2022-08-13 DIAGNOSIS — K769 Liver disease, unspecified: Secondary | ICD-10-CM

## 2022-08-13 DIAGNOSIS — J90 Pleural effusion, not elsewhere classified: Secondary | ICD-10-CM | POA: Insufficient documentation

## 2022-08-13 NOTE — Patient Instructions (Addendum)
It was good to see you today.  I do not see or feel any evidence of cancer recurrence on your exam.   Plan on having an MRI of the abdomen to further evaluate the areas in the liver. Nothing to eat or drink 4 hours before your MRI. We will contact you with the results.   Dr. Pricilla Holm will see you for follow-up in 3 months or sooner if needed.   As always, if you develop any new and concerning symptoms before your next visit, please call to see me sooner.  Symptoms to report to your health care team include vaginal bleeding, rectal bleeding, bloating, weight loss without effort, new and persistent pain, new and  persistent fatigue, new leg swelling, new masses (i.e., bumps in your neck or groin), new and persistent cough, new and persistent nausea and vomiting, change in bowel or bladder habits, and any other concerns.

## 2022-08-13 NOTE — Progress Notes (Signed)
Gynecologic Oncology Return Clinic Visit  08/13/2022  Reason for Visit: surveillance in the setting of uterine cancer  Treatment History: Oncology History Overview Note  MMR intact, MS stable HER2 +   Serous carcinoma of body of uterus (HCC)  10/02/2021 Imaging   Pelvic ultrasound: 11.1 x 6.5 x 4.6 cm with multiple fibroids measuring up to 2.2 cm.  Endocervical lesion thought to represent a polyp measures 7 mm.  Endometrium is asymmetrically thickened measuring up to 22 mm, noted to be cystic and vascular concerning for possible polyps.  Moderate free fluid noted.  Neither ovary well appreciated.    10/10/2021 Initial Biopsy   EMB: benign endometrial polyp, scant benign endocervix.    11/18/2021 Surgery   Endocervical polypectomy, endometrial polypectomy, and D&C. Findings at the time of surgery included an enlarged anteverted uterus, no adnexal masses. Cervical polyp noted. On hysteroscopy, multiple endometrial polyps with some thickening of the uterine wall noted. Final pathology revealed benign cervical polyp with metaplastic changes. Endometrial polyp revealed high-grade serous carcinoma as did the endometrial curettage specimen.    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 Results of the tumor marker test revealed normal level, 17.1.   12/05/2021 Imaging   CT C/A/P: 1. Heterogeneity of the uterus and cervix with endometrial fluid, compatible with the provided history of endometrial cancer. No evidence of metastatic disease. 2. Small pericardial effusion. 3. Liver may be mildly steatotic. 4. Retroareolar left breast nodule measures 1.5 cm. Patient underwent diagnostic left mammogram and ultrasound on 11/21/2021. 5. Overall dense appearance of the bones, compatible with a known history of myeloproliferative neoplasm. 6.  Aortic atherosclerosis (ICD10-I70.0).    12/24/2021 Surgery   Robotic-assisted lysis of adhesions, total hysterectomy with bilateral salpingoophorectomy, SLN biopsy bilaterally, resection of peritoneal lesions, omentectomy    Findings:  On EUA, 10-12 cm moderately mobile uterus. 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 Normal appearing liver otherwise, stomach, and diaphragm. Normal appearing omentum. Some adhesions of the cecum and ascending colon to the right abdominal sidewall and of the appendix to the right IP ligament and medial aspect of the broad ligament. Otherwise normal appearing small and large bowel. Small volume ascites in the pelvis. Normal appearing adnexa. No obvious adenopathy. Mapping successful to right presacral and left external iliac SLN. Some adhesions between the bladder peritoneum and the anterior uterus. Two small (<1-2 cm) intramural anterior LUS fibroids. Uterus somewhat enlarged and bulbous at the fundus. Numerous bulbous/cystic appearing lesions along the peritoneum - posterior cul de sac, uterosacral ligaments, anterior cul de sac. Findings most c/w endosalpingosis versus endometriosis. Frozen section of some of these lesions sent and not c/w metastatic clear cell carcinoma, defer further characterization to final pathology.     12/24/2021 Pathology Results   Serous endometrial cancer Confined to endometrium Negative SLNs, no LVI Washings negative Peritoneal implant - negative for carcinoma Omentum negative MMR intact, MS-stable, HER2 3+   CA 125 on 08/14/2022 at 6.6. Additional labs on this day as well: CBC, CMP, LDH  CT scan from 08/10/2022: 1. Interval hysterectomy. No evidence of local recurrence. 2. Cystic appearing lesions of the left liver dome, largest of the two lesions is increased in size when compared with the prior exam. Recommend further evaluation with MRI of the abdomen with and without contrast. 3.  Unchanged diffuse osseous  sclerosis, consistent with history of myeloproliferative disease.  Interval History: She reports doing well since her last visit. She has been walking 3 miles and wants to strive for 5 miles. Bowels and bladder functioning without difficulty. Tolerating diet with no nausea or emesis. No vaginal discharge or bleeding. Denies any abdominal or pelvic pain. She recently traveled for a college graduation. No concerning symptoms of recurrence voiced today.     Past Medical/Surgical History: Past Medical History:  Diagnosis Date   Blood dyscrasia    Diabetes mellitus without complication (HCC)    Goals of care, counseling/discussion 12/15/2019   Heart murmur    History of kidney stones    5-6 yrs ago. 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  10/30/2021   DILATATION & CURETTAGE/HYSTEROSCOPY WITH MYOSURE N/A 11/18/2021   Procedure: DILATATION & CURETTAGE/HYSTEROSCOPY WITH MYOSURE;  Surgeon: Romualdo Bolk, MD;  Location: [facility] [place];  Service: Gynecology;  Laterality: N/A;   ECTOPIC PREGNANCY SURGERY  03/02/1982   salpingostomy   ROBOTIC ASSISTED LAPAROSCOPIC LYSIS OF ADHESION N/A 12/24/2021   Procedure: XI ROBOTIC ASSISTED LAPAROSCOPIC LYSIS OF ADHESION;  Surgeon: Carver Fila, MD;  Location: [facility];  Service: Gynecology;  Laterality: N/A;   SENTINEL NODE BIOPSY Bilateral 12/24/2021   Procedure: SENTINEL NODE BIOPSY;  Surgeon: Carver Fila, MD;  Location: [facility];  Service: Gynecology;  Laterality: Bilateral;    Family History  Problem Relation Age of Onset   Heart disease Mother        died age 19   Kidney disease Mother        family hx   Heart attack Father 22        massive   Hypertension Sister    Hypertension Sister    Hypertension Sister    Hypertension Sister    Arthritis Other        family hx   Diabetes Other        family hx   Stroke Other         dialysis   Colon cancer Neg Hx    Breast cancer Neg Hx    Ovarian cancer Neg Hx    Endometrial cancer Neg Hx    Pancreatic cancer Neg Hx    Prostate cancer Neg Hx     Social History   Socioeconomic History   Marital status: Married    Spouse name: Not on file   Number of children: Not on file   Years of education: Not on file   Highest education level: Not on file  Occupational History   Occupation: retired   Occupation: part-time works for her church  Tobacco Use   Smoking status: Never   Smokeless tobacco: Never   Tobacco comments:    never used tobacco  Building services engineer Use: Never used  Substance and Sexual Activity   Alcohol use: No    Alcohol/week: 0.0 standard drinks of alcohol   Drug use: No   Sexual activity: Not Currently    Partners: Male    Birth control/protection: Post-menopausal  Other Topics Concern   Not on file  Social History Narrative   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   No tobacco no ethoh little caffiene .    Social Determinants of Health   Financial Resource Strain: Low Risk  (01/27/2022)   Overall Financial Resource Strain (CARDIA)    Difficulty of Paying Living Expenses: Not hard at all  Food Insecurity: No Food Insecurity (01/27/2022)   Hunger Vital Sign    Worried About Running Out of Food in the Last Year: Never true    Ran Out of Food in the Last Year: Never true  Transportation Needs: No Transportation Needs (01/27/2022)   PRAPARE - Administrator, Civil Service (Medical): No    Lack of Transportation (Non-Medical): No  Physical Activity: Insufficiently Active (01/27/2022)   Exercise Vital Sign    Days of Exercise per Week: 3 days    Minutes of Exercise per Session: 30 min   Stress: No Stress Concern Present (01/27/2022)   [employer] of Occupational Health - Occupational Stress Questionnaire    Feeling of Stress : Not at all  Social Connections: Socially Integrated (01/27/2022)   Social Connection and Isolation Panel [NHANES]    Frequency of Communication with Friends and Family: More than three times a week    Frequency of Social Gatherings with Friends and Family: More than three times a week    Attends Religious Services: More than 4 times per year    Active Member of [employer] or Organizations: Yes    Attends Engineer, structural: More than 4 times per year    Marital Status: Married    Current Medications:  Current Outpatient Medications:    amLODipine (NORVASC) 10 MG tablet, TAKE 1 TABLET BY MOUTH EVERY DAY, Disp: 90 tablet, Rfl: 1   aspirin EC 81 MG tablet, Take 81 mg by mouth every evening., Disp: , Rfl:    Fe Fum-FePoly-Vit C-Vit B3 (INTEGRA) 62.5-62.5-40-3 MG CAPS, TAKE 1 CAPSULE BY MOUTH DAILY (Patient taking differently: Take 1 capsule by mouth every evening.), Disp: 30 capsule, Rfl: 8   folic acid (FOLVITE) 1 MG tablet, Take 2 tablets by mouth once daily, Disp: 60 tablet, Rfl: 0   lisinopril (ZESTRIL) 20 MG tablet, TAKE 2 TABLETS BY MOUTH EVERY DAY, Disp: 180 tablet, Rfl: 3   Nebivolol HCl 20 MG TABS, TAKE 1 TABLET(20 MG) BY MOUTH DAILY, Disp: 90 tablet, Rfl: 1   Vitamin D, Ergocalciferol, (DRISDOL) 1.25 MG (50000 UNIT) CAPS capsule, TAKE 1 CAPSULE BY MOUTH 1 TIME A WEEK, Disp: 12 capsule, Rfl: 2   anagrelide (AGRYLIN) 1 MG capsule, Take 4 capsules (4 mg total) by mouth daily., Disp: 120 capsule, Rfl: 5  Review of Systems: See interval. ROS intake form negative for below. Denies appetite changes, fevers, chills, fatigue, unexplained weight changes. Denies hearing loss, neck lumps or masses, mouth sores, ringing in ears or voice changes. Denies cough or wheezing.  Denies shortness of breath. Denies chest pain or palpitations.  Denies leg swelling. Denies abdominal distention, pain, blood in stools, constipation, diarrhea, nausea, vomiting, or early satiety. Denies pain with intercourse, dysuria, frequency, hematuria or incontinence. Denies hot flashes, pelvic pain, vaginal bleeding or vaginal discharge.   Denies joint pain, back pain or muscle pain/cramps. Denies itching, rash, or wounds. Denies dizziness, headaches, numbness or seizures. Denies swollen lymph nodes or glands, denies easy bruising or bleeding. Denies anxiety, depression, confusion, or decreased concentration.  Physical Exam: BP (!) 141/83 (BP Location: Left Arm,  Patient Position: Sitting)   Pulse 90   Temp 98.3 F (36.8 C) (Oral)   Resp 16   Ht 5\' 11"  (1.803 m)   Wt 130 lb (59 kg)   BMI 18.13 kg/m  General: Alert, oriented, no acute distress. HEENT: Normocephalic, atraumatic, sclera anicteric. Chest: Clear to auscultation bilaterally.  No wheezes or rhonchi. Cardiovascular: Regular rate and rhythm, no murmurs. Abdomen: Soft, non-tender, non-obese.  Normoactive bowel sounds.  No masses or hepatosplenomegaly appreciated.  Well-healed incisions. Extremities: Grossly normal range of motion.  Warm, well perfused.  No edema bilaterally. Skin: No rashes or lesions noted. Lymphatics: No cervical, supraclavicular, or inguinal adenopathy. GU: Normal appearing external genitalia without erythema, excoriation, or lesions.  Speculum exam reveals mildly atrophic vaginal mucosa.  Cuff is well healed and intact. No masses or atypical vascularity.  Bimanual exam reveals cuff well healed/intact, no nodularity.  Rectovaginal exam confirms findings, no masses palpated.  Laboratory & Radiologic Studies: CT AP 08/10/2022  Assessment & Plan: Alexandra [name] is a 72 y.o. woman with Stage IC (by 2023 FIGO staging) uterine serous carcinoma who presents for her surveillance examination. Her tumor was confined to the endometrium, negative washings. MMR intact,  MS-stable, Her 2 +.   The patient is doing very well and is NED on exam today.   Her recent CT scan was discussed in detail. To further evaluate the liver lesions, we will plan on obtaining a MRI of the abdomen. She will be contacted with these results by Dr. Pricilla Holm when available.  Scan results also faxed to PCP for continued monitoring of cardiomegaly, small pleural effusion.    Per NCCN surveillance recommendations, in the setting of high risk disease, we will continue with visits every 3 months for the first 2-3 years before transitioning to 15-month surveillance visits.  Signs and symptoms that would be concerning for cancer recurrence reviewed and listed in AVS. She is advised to call for any needs, concerns, or new symptoms.  20 minutes of total time was spent for this patient encounter, including preparation, face-to-face counseling with the patient and coordination of care, and documentation of the encounter.  Warner Mccreedy NP [facility] G[facility] Oncology

## 2022-08-14 ENCOUNTER — Inpatient Hospital Stay: Payer: Medicare PPO

## 2022-08-14 ENCOUNTER — Other Ambulatory Visit: Payer: Self-pay

## 2022-08-14 ENCOUNTER — Encounter: Payer: Self-pay | Admitting: Hematology & Oncology

## 2022-08-14 ENCOUNTER — Inpatient Hospital Stay (HOSPITAL_BASED_OUTPATIENT_CLINIC_OR_DEPARTMENT_OTHER): Payer: Medicare PPO | Admitting: Hematology & Oncology

## 2022-08-14 VITALS — BP 138/72 | HR 83 | Temp 97.9°F | Resp 18 | Ht 71.0 in | Wt 130.0 lb

## 2022-08-14 DIAGNOSIS — I517 Cardiomegaly: Secondary | ICD-10-CM | POA: Diagnosis not present

## 2022-08-14 DIAGNOSIS — Z8542 Personal history of malignant neoplasm of other parts of uterus: Secondary | ICD-10-CM | POA: Diagnosis not present

## 2022-08-14 DIAGNOSIS — Z90710 Acquired absence of both cervix and uterus: Secondary | ICD-10-CM | POA: Diagnosis not present

## 2022-08-14 DIAGNOSIS — D473 Essential (hemorrhagic) thrombocythemia: Secondary | ICD-10-CM | POA: Diagnosis not present

## 2022-08-14 DIAGNOSIS — Z90722 Acquired absence of ovaries, bilateral: Secondary | ICD-10-CM | POA: Diagnosis not present

## 2022-08-14 DIAGNOSIS — J90 Pleural effusion, not elsewhere classified: Secondary | ICD-10-CM | POA: Diagnosis not present

## 2022-08-14 DIAGNOSIS — D649 Anemia, unspecified: Secondary | ICD-10-CM | POA: Diagnosis not present

## 2022-08-14 DIAGNOSIS — Z7982 Long term (current) use of aspirin: Secondary | ICD-10-CM | POA: Diagnosis not present

## 2022-08-14 DIAGNOSIS — Z79899 Other long term (current) drug therapy: Secondary | ICD-10-CM | POA: Diagnosis not present

## 2022-08-14 LAB — CMP (CANCER CENTER ONLY)
ALT: 7 U/L (ref 0–44)
AST: 14 U/L — ABNORMAL LOW (ref 15–41)
Albumin: 4.3 g/dL (ref 3.5–5.0)
Alkaline Phosphatase: 79 U/L (ref 38–126)
Anion gap: 7 (ref 5–15)
BUN: 26 mg/dL — ABNORMAL HIGH (ref 8–23)
CO2: 29 mmol/L (ref 22–32)
Calcium: 10 mg/dL (ref 8.9–10.3)
Chloride: 106 mmol/L (ref 98–111)
Creatinine: 1.34 mg/dL — ABNORMAL HIGH (ref 0.44–1.00)
GFR, Estimated: 42 mL/min — ABNORMAL LOW (ref >60)
Glucose, Bld: 115 mg/dL — ABNORMAL HIGH (ref 70–99)
Potassium: 4.4 mmol/L (ref 3.5–5.1)
Sodium: 142 mmol/L (ref 135–145)
Total Bilirubin: 0.6 mg/dL (ref 0.3–1.2)
Total Protein: 7.3 g/dL (ref 6.5–8.1)

## 2022-08-14 LAB — CBC WITH DIFFERENTIAL (CANCER CENTER ONLY)
Abs Immature Granulocytes: 0.05 10*3/uL (ref 0.00–0.07)
Basophils Absolute: 0.1 10*3/uL (ref 0.0–0.1)
Basophils Relative: 1 %
Eosinophils Absolute: 0.2 10*3/uL (ref 0.0–0.5)
Eosinophils Relative: 2 %
HCT: 25.4 % — ABNORMAL LOW (ref 36.0–46.0)
Hemoglobin: 7.8 g/dL — ABNORMAL LOW (ref 12.0–15.0)
Immature Granulocytes: 1 %
Lymphocytes Relative: 24 %
Lymphs Abs: 1.7 10*3/uL (ref 0.7–4.0)
MCH: 24.8 pg — ABNORMAL LOW (ref 26.0–34.0)
MCHC: 30.7 g/dL (ref 30.0–36.0)
MCV: 80.6 fL (ref 80.0–100.0)
Monocytes Absolute: 0.7 10*3/uL (ref 0.1–1.0)
Monocytes Relative: 11 %
Neutro Abs: 4.3 10*3/uL (ref 1.7–7.7)
Neutrophils Relative %: 61 %
Platelet Count: 294 10*3/uL (ref 150–400)
RBC: 3.15 MIL/uL — ABNORMAL LOW (ref 3.87–5.11)
RDW: 19.2 % — ABNORMAL HIGH (ref 11.5–15.5)
WBC Count: 7.1 10*3/uL (ref 4.0–10.5)
nRBC: 0.3 % — ABNORMAL HIGH (ref 0.0–0.2)

## 2022-08-14 LAB — LACTATE DEHYDROGENASE: LDH: 217 U/L — ABNORMAL HIGH (ref 98–192)

## 2022-08-14 MED ORDER — ANAGRELIDE HCL 1 MG PO CAPS: 4.0000 mg | ORAL_CAPSULE | Freq: Every day | ORAL | 5 refills | Status: DC

## 2022-08-14 NOTE — Progress Notes (Signed)
Plan Alexandra [name]  It is a [employer].  Hematology and Oncology Follow Up Visit  Alexandra [name] [id number] 1951-02-02 72 y.o. 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 daily --changed on 08/14/2022 Folic acid 2 mg p.o. daily  Aspirin 81 mg p.o. daily  Procrit 40,000 units SQ weekly for hemoglobin less than 10-patient declined to date S/p TAH-BSO -- 12/24/2021  Interim History:  Ms.  Asselin is back for followup.  She looks fantastic.  She really is doing a great job.  She really has had no specific complaints.  She is exercising.  She is walking quite a bit.  She had a CT scan done on 08/10/2022.  She says this came out normal.  There is no evidence of recurrence of the uterine cancer.  Her last CEA-125 was normal at think 6.8.  She has maintained a relatively stable platelet count.  Will go and try to decrease her anagrelide to 4 mg a day.  There has been no fever.  Her appetite has been good.  She has had no nausea or vomiting.  She has had no cough.  There is been no change in bowel or bladder habits.  She has had no rashes.  There is been no bleeding.  Overall, I would say that her performance status is probably ECOG 1.   Medications:  Current Outpatient Medications:    amLODipine (NORVASC) 10 MG tablet, TAKE 1 TABLET BY MOUTH EVERY DAY, Disp: 90 tablet, Rfl: 1   anagrelide (AGRYLIN) 1 MG capsule, Take by mouth., Disp: , Rfl:    aspirin EC 81 MG tablet, Take 81 mg by mouth every evening., Disp: , Rfl:    Fe Fum-FePoly-Vit C-Vit B3 (INTEGRA) 62.5-62.5-40-3 MG CAPS, TAKE 1 CAPSULE BY MOUTH DAILY (Patient taking differently: Take 1 capsule by mouth every evening.), Disp: 30 capsule, Rfl: 8   folic acid (FOLVITE) 1 MG tablet, Take 2 tablets by mouth once daily, Disp: 60 tablet, Rfl: 0   lisinopril  (ZESTRIL) 20 MG tablet, TAKE 2 TABLETS BY MOUTH EVERY DAY, Disp: 180 tablet, Rfl: 3   Nebivolol HCl 20 MG TABS, TAKE 1 TABLET(20 MG) BY MOUTH DAILY, Disp: 90 tablet, Rfl: 1   Vitamin D, Ergocalciferol, (DRISDOL) 1.25 MG (50000 UNIT) CAPS capsule, TAKE 1 CAPSULE BY MOUTH 1 TIME A WEEK, Disp: 12 capsule, Rfl: 2  Allergies: No Known Allergies  Past Medical History, Surgical history, Social history, and Family History were reviewed and updated.  Review of Systems: Review of Systems  Constitutional: Negative.   HENT: Negative.    Eyes: Negative.   Respiratory: Negative.    Cardiovascular: Negative.   Gastrointestinal: Negative.   Genitourinary: Negative.   Musculoskeletal: Negative.   Skin: Negative.   Neurological: Negative.   Endo/Heme/Allergies: Negative.   Psychiatric/Behavioral: Negative.    Marland Kitchen  Physical Exam:  height is 5\' 11"  (1.803 m) and weight is 130 lb (59 kg). Her oral temperature is 97.9 F (36.6 C). Her blood pressure is 138/72 and her pulse is 83. Her respiration is 18 and oxygen saturation is 100%. =jh  Physical Exam Vitals reviewed.  HENT:     Head: Normocephalic and atraumatic.  Eyes:     Pupils: Pupils are equal, round, and reactive to light.  Cardiovascular:     Rate and Rhythm: Normal rate and regular rhythm.  Heart sounds: Normal heart sounds.  Pulmonary:     Effort: Pulmonary effort is normal.     Breath sounds: Normal breath sounds.  Abdominal:     General: Bowel sounds are normal.     Palpations: Abdomen is soft.     Comments: Abdominal exam shows well-healed laparoscopic scars.  There is no fluid wave.  There is no guarding or rebound tenderness.  There is no palpable hepatosplenomegaly.  Musculoskeletal:        General: No tenderness or deformity. Normal range of motion.     Cervical back: Normal range of motion.  Lymphadenopathy:     Cervical: No cervical adenopathy.  Skin:    General: Skin is warm and dry.     Findings: No erythema or rash.   Neurological:     Mental Status: She is alert and oriented to person, place, and time.  Psychiatric:        Behavior: Behavior normal.        Thought Content: Thought content normal.        Judgment: Judgment normal.      Lab Results  Component Value Date   WBC 7.1 08/14/2022   HGB 7.8 (L) 08/14/2022   HCT 25.4 (L) 08/14/2022   MCV 80.6 08/14/2022   PLT 294 08/14/2022     Chemistry      Component Value Date/Time   NA 142 08/14/2022 1132   NA 144 02/02/2017 1158   NA 140 07/24/2016 1007   K 4.4 08/14/2022 1132   K 3.7 02/02/2017 1158   K 3.6 07/24/2016 1007   CL 106 08/14/2022 1132   CL 103 02/02/2017 1158   CO2 29 08/14/2022 1132   CO2 29 02/02/2017 1158   CO2 26 07/24/2016 1007   BUN 26 (H) 08/14/2022 1132   BUN 20 02/02/2017 1158   BUN 30.3 (H) 07/24/2016 1007   CREATININE 1.34 (H) 08/14/2022 1132   CREATININE 0.9 02/02/2017 1158   CREATININE 1.1 07/24/2016 1007   GLU 129 02/02/2017 0000      Component Value Date/Time   CALCIUM 10.0 08/14/2022 1132   CALCIUM 9.0 02/02/2017 1158   CALCIUM 9.3 07/24/2016 1007   ALKPHOS 79 08/14/2022 1132   ALKPHOS 110 (H) 02/02/2017 1158   ALKPHOS 104 07/24/2016 1007   AST 14 (L) 08/14/2022 1132   AST 13 07/24/2016 1007   ALT 7 08/14/2022 1132   ALT 26 02/02/2017 1158   ALT 15 07/24/2016 1007   BILITOT 0.6 08/14/2022 1132   BILITOT 0.55 07/24/2016 1007      Impression and Plan: Ms. Malloch is 72 year old [place] female. She has essential thrombocythemia. She actually is Calreticulin positive.  Her platelet count is doing quite nicely.  Again, we are going to decrease the anagrelide down to 4 mg a day.  I know that she will have a very busy summer.  As always, she goes down to [place], [place] for a high school reunion.  I am just happy that her quality life is doing well.  I am happy that there is no problems with respect to the uterine cancer.  We will plan to get her back to see [place] in another 2  months.  Josph Macho, MD 6/14/202412:20 PM

## 2022-08-15 LAB — CA 125: Cancer Antigen (CA) 125: 6.6 U/mL (ref 0.0–38.1)

## 2022-08-17 ENCOUNTER — Encounter: Payer: Self-pay | Admitting: Family

## 2022-08-17 DIAGNOSIS — I517 Cardiomegaly: Secondary | ICD-10-CM | POA: Insufficient documentation

## 2022-08-17 DIAGNOSIS — K769 Liver disease, unspecified: Secondary | ICD-10-CM | POA: Insufficient documentation

## 2022-08-26 ENCOUNTER — Ambulatory Visit (HOSPITAL_COMMUNITY): Payer: Medicare PPO

## 2022-08-27 ENCOUNTER — Other Ambulatory Visit: Payer: Self-pay | Admitting: Internal Medicine

## 2022-08-27 ENCOUNTER — Other Ambulatory Visit: Payer: Self-pay | Admitting: Hematology & Oncology

## 2022-08-27 DIAGNOSIS — D473 Essential (hemorrhagic) thrombocythemia: Secondary | ICD-10-CM

## 2022-08-27 DIAGNOSIS — D508 Other iron deficiency anemias: Secondary | ICD-10-CM

## 2022-09-01 ENCOUNTER — Ambulatory Visit (HOSPITAL_COMMUNITY)
Admission: RE | Admit: 2022-09-01 | Discharge: 2022-09-01 | Disposition: A | Discharge status: Home / Self Care | Payer: Medicare PPO | Source: Ambulatory Visit | Attending: Gynecologic Oncology | Admitting: Gynecologic Oncology

## 2022-09-01 DIAGNOSIS — K7689 Other specified diseases of liver: Secondary | ICD-10-CM | POA: Diagnosis not present

## 2022-09-01 DIAGNOSIS — K769 Liver disease, unspecified: Secondary | ICD-10-CM | POA: Insufficient documentation

## 2022-09-01 DIAGNOSIS — D1803 Hemangioma of intra-abdominal structures: Secondary | ICD-10-CM | POA: Diagnosis not present

## 2022-09-01 DIAGNOSIS — C549 Malignant neoplasm of corpus uteri, unspecified: Secondary | ICD-10-CM | POA: Insufficient documentation

## 2022-09-01 DIAGNOSIS — K760 Fatty (change of) liver, not elsewhere classified: Secondary | ICD-10-CM | POA: Diagnosis not present

## 2022-09-01 MED ORDER — GADOBUTROL 1 MMOL/ML IV SOLN
6.0000 mL | Freq: Once | INTRAVENOUS | Status: AC | PRN
  Administered 2022-09-01: 6 mL via INTRAVENOUS

## 2022-09-08 ENCOUNTER — Telehealth: Payer: Self-pay | Admitting: Gynecologic Oncology

## 2022-09-08 NOTE — Telephone Encounter (Signed)
Called patient - discussed MRI results. Benign appearing liver cysts despite some growth in larger lesion. No malignant or aggressive features. Will follow this on future imaging.  Alexandra Garnet MD Gynecologic Oncology

## 2022-09-17 ENCOUNTER — Other Ambulatory Visit: Payer: Self-pay | Admitting: Internal Medicine

## 2022-10-08 ENCOUNTER — Other Ambulatory Visit: Payer: Self-pay | Admitting: Hematology & Oncology

## 2022-10-08 DIAGNOSIS — D473 Essential (hemorrhagic) thrombocythemia: Secondary | ICD-10-CM

## 2022-10-08 DIAGNOSIS — D508 Other iron deficiency anemias: Secondary | ICD-10-CM

## 2022-10-09 ENCOUNTER — Encounter: Payer: Self-pay | Admitting: Family

## 2022-10-14 ENCOUNTER — Other Ambulatory Visit: Payer: Self-pay

## 2022-10-14 ENCOUNTER — Encounter: Payer: Self-pay | Admitting: Hematology & Oncology

## 2022-10-14 ENCOUNTER — Inpatient Hospital Stay: Payer: Medicare PPO | Admitting: Hematology & Oncology

## 2022-10-14 ENCOUNTER — Inpatient Hospital Stay: Payer: Medicare PPO | Attending: Hematology & Oncology

## 2022-10-14 VITALS — BP 146/68 | HR 81 | Temp 97.9°F | Resp 18 | Ht 71.0 in | Wt 130.8 lb

## 2022-10-14 DIAGNOSIS — Z90710 Acquired absence of both cervix and uterus: Secondary | ICD-10-CM | POA: Diagnosis not present

## 2022-10-14 DIAGNOSIS — Z7982 Long term (current) use of aspirin: Secondary | ICD-10-CM | POA: Diagnosis not present

## 2022-10-14 DIAGNOSIS — C549 Malignant neoplasm of corpus uteri, unspecified: Secondary | ICD-10-CM | POA: Diagnosis not present

## 2022-10-14 DIAGNOSIS — D473 Essential (hemorrhagic) thrombocythemia: Secondary | ICD-10-CM

## 2022-10-14 DIAGNOSIS — Z90722 Acquired absence of ovaries, bilateral: Secondary | ICD-10-CM | POA: Insufficient documentation

## 2022-10-14 DIAGNOSIS — Z8542 Personal history of malignant neoplasm of other parts of uterus: Secondary | ICD-10-CM | POA: Diagnosis not present

## 2022-10-14 DIAGNOSIS — D75839 Thrombocytosis, unspecified: Secondary | ICD-10-CM | POA: Insufficient documentation

## 2022-10-14 DIAGNOSIS — Z79899 Other long term (current) drug therapy: Secondary | ICD-10-CM | POA: Diagnosis not present

## 2022-10-14 DIAGNOSIS — Z9079 Acquired absence of other genital organ(s): Secondary | ICD-10-CM | POA: Diagnosis not present

## 2022-10-14 LAB — CBC WITH DIFFERENTIAL (CANCER CENTER ONLY)
Abs Immature Granulocytes: 0.04 10*3/uL (ref 0.00–0.07)
Basophils Absolute: 0 10*3/uL (ref 0.0–0.1)
Basophils Relative: 1 %
Eosinophils Absolute: 0.1 10*3/uL (ref 0.0–0.5)
Eosinophils Relative: 2 %
HCT: 24.6 % — ABNORMAL LOW (ref 36.0–46.0)
Hemoglobin: 7.5 g/dL — ABNORMAL LOW (ref 12.0–15.0)
Immature Granulocytes: 1 %
Lymphocytes Relative: 24 %
Lymphs Abs: 1.3 10*3/uL (ref 0.7–4.0)
MCH: 25.1 pg — ABNORMAL LOW (ref 26.0–34.0)
MCHC: 30.5 g/dL (ref 30.0–36.0)
MCV: 82.3 fL (ref 80.0–100.0)
Monocytes Absolute: 0.6 10*3/uL (ref 0.1–1.0)
Monocytes Relative: 11 %
Neutro Abs: 3.4 10*3/uL (ref 1.7–7.7)
Neutrophils Relative %: 61 %
Platelet Count: 313 10*3/uL (ref 150–400)
RBC: 2.99 MIL/uL — ABNORMAL LOW (ref 3.87–5.11)
RDW: 19.2 % — ABNORMAL HIGH (ref 11.5–15.5)
WBC Count: 5.5 10*3/uL (ref 4.0–10.5)
nRBC: 0 % (ref 0.0–0.2)

## 2022-10-14 LAB — CMP (CANCER CENTER ONLY)
ALT: 13 U/L (ref 0–44)
AST: 15 U/L (ref 15–41)
Albumin: 4.4 g/dL (ref 3.5–5.0)
Alkaline Phosphatase: 78 U/L (ref 38–126)
Anion gap: 7 (ref 5–15)
BUN: 27 mg/dL — ABNORMAL HIGH (ref 8–23)
CO2: 29 mmol/L (ref 22–32)
Calcium: 9.6 mg/dL (ref 8.9–10.3)
Chloride: 105 mmol/L (ref 98–111)
Creatinine: 1.11 mg/dL — ABNORMAL HIGH (ref 0.44–1.00)
GFR, Estimated: 53 mL/min — ABNORMAL LOW (ref >60)
Glucose, Bld: 108 mg/dL — ABNORMAL HIGH (ref 70–99)
Potassium: 3.8 mmol/L (ref 3.5–5.1)
Sodium: 141 mmol/L (ref 135–145)
Total Bilirubin: 0.7 mg/dL (ref 0.3–1.2)
Total Protein: 7.3 g/dL (ref 6.5–8.1)

## 2022-10-14 LAB — LACTATE DEHYDROGENASE: LDH: 214 U/L — ABNORMAL HIGH (ref 98–192)

## 2022-10-14 LAB — SAVE SMEAR(SSMR), FOR PROVIDER SLIDE REVIEW

## 2022-10-14 LAB — IRON AND IRON BINDING CAPACITY (CC-WL,HP ONLY)
Iron: 79 ug/dL (ref 28–170)
Saturation Ratios: 32 % — ABNORMAL HIGH (ref 10.4–31.8)
TIBC: 251 ug/dL (ref 250–450)
UIBC: 172 ug/dL (ref 148–442)

## 2022-10-14 LAB — FERRITIN: Ferritin: 226 ng/mL (ref 11–307)

## 2022-10-14 NOTE — Progress Notes (Signed)
Plan Alexandra [name]  It is a [employer].  Hematology and Oncology Follow Up Visit  Alexandra [name] [id number] [birth year] 72 y.o. 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 daily --changed on 08/14/2022 Folic acid 2 mg p.o. daily  Aspirin 81 mg p.o. daily  Procrit 40,000 units SQ weekly for hemoglobin less than 10-patient declined to date S/p TAH-BSO -- 12/24/2021  Interim History:  [name] is back for followup.  She looks fantastic.  She really is doing a great job.  She really has had no specific complaints.  She is exercising.  She is walking quite a bit.  She had a nice family union down in [place], [place].  She always has a good time with her family.  She has had no problems with nausea or vomiting.  She has had no problems with bowels or bladder.  She has been followed by Gynecology for her history of uterine cancer.  Her last CA-125 was 6.6.  She has had no bleeding.  There is been no leg swelling.  She has had no fever.  There has been no exposure to COVID.  Overall, I would say that her performance status is probably ECOG 0.    Medications:  Current Outpatient Medications:    amLODipine (NORVASC) 10 MG tablet, TAKE 1 TABLET BY MOUTH EVERY DAY, Disp: 90 tablet, Rfl: 0   anagrelide (AGRYLIN) 1 MG capsule, Take 4 capsules (4 mg total) by mouth daily., Disp: 120 capsule, Rfl: 5   aspirin EC 81 MG tablet, Take 81 mg by mouth every evening., Disp: , Rfl:    Fe Fum-FePoly-Vit C-Vit B3 (INTEGRA) 62.5-62.5-40-3 MG CAPS, TAKE 1 CAPSULE BY MOUTH DAILY (Patient taking differently: Take 1 capsule by mouth every evening.), Disp: 30 capsule, Rfl: 8   folic acid (FOLVITE) 1 MG tablet, Take 2 tablets by mouth once daily, Disp: 60 tablet, Rfl: 0   lisinopril (ZESTRIL) 20 MG tablet, TAKE 2 TABLETS BY MOUTH  EVERY DAY, Disp: 180 tablet, Rfl: 3   Nebivolol HCl 20 MG TABS, TAKE 1 TABLET(20 MG) BY MOUTH DAILY, Disp: 90 tablet, Rfl: 1   Vitamin D, Ergocalciferol, (DRISDOL) 1.25 MG (50000 UNIT) CAPS capsule, TAKE 1 CAPSULE BY MOUTH 1 TIME A WEEK, Disp: 12 capsule, Rfl: 2  Allergies: No Known Allergies  Past Medical History, Surgical history, Social history, and Family History were reviewed and updated.  Review of Systems: Review of Systems  Constitutional: Negative.   HENT: Negative.    Eyes: Negative.   Respiratory: Negative.    Cardiovascular: Negative.   Gastrointestinal: Negative.   Genitourinary: Negative.   Musculoskeletal: Negative.   Skin: Negative.   Neurological: Negative.   Endo/Heme/Allergies: Negative.   Psychiatric/Behavioral: Negative.    Marland Kitchen  Physical Exam:  height is 5\' 11"  (1.803 m) and weight is 130 lb 12.8 oz (59.3 kg). Her oral temperature is 97.9 F (36.6 C). Her blood pressure is 146/68 (abnormal) and her pulse is 81. Her respiration is 18 and oxygen saturation is 100%. =jh  Physical Exam Vitals reviewed.  HENT:     Head: Normocephalic and atraumatic.  Eyes:     Pupils: Pupils are equal, round, and reactive to light.  Cardiovascular:     Rate and Rhythm: Normal rate and regular rhythm.     Heart sounds: Normal heart sounds.  Pulmonary:     Effort: Pulmonary effort is normal.     Breath sounds: Normal breath sounds.  Abdominal:     General: Bowel sounds are normal.     Palpations: Abdomen is soft.     Comments: Abdominal exam shows well-healed laparoscopic scars.  There is no fluid wave.  There is no guarding or rebound tenderness.  There is no palpable hepatosplenomegaly.  Musculoskeletal:        General: No tenderness or deformity. Normal range of motion.     Cervical back: Normal range of motion.  Lymphadenopathy:     Cervical: No cervical adenopathy.  Skin:    General: Skin is warm and dry.     Findings: No erythema or rash.  Neurological:      Mental Status: She is alert and oriented to person, place, and time.  Psychiatric:        Behavior: Behavior normal.        Thought Content: Thought content normal.        Judgment: Judgment normal.      Lab Results  Component Value Date   WBC 5.5 10/14/2022   HGB 7.5 (L) 10/14/2022   HCT 24.6 (L) 10/14/2022   MCV 82.3 10/14/2022   PLT 313 10/14/2022     Chemistry      Component Value Date/Time   NA 141 10/14/2022 1226   NA 144 02/02/2017 1158   NA 140 07/24/2016 1007   K 3.8 10/14/2022 1226   K 3.7 02/02/2017 1158   K 3.6 07/24/2016 1007   CL 105 10/14/2022 1226   CL 103 02/02/2017 1158   CO2 29 10/14/2022 1226   CO2 29 02/02/2017 1158   CO2 26 07/24/2016 1007   BUN 27 (H) 10/14/2022 1226   BUN 20 02/02/2017 1158   BUN 30.3 (H) 07/24/2016 1007   CREATININE 1.11 (H) 10/14/2022 1226   CREATININE 0.9 02/02/2017 1158   CREATININE 1.1 07/24/2016 1007   GLU 129 02/02/2017 0000      Component Value Date/Time   CALCIUM 9.6 10/14/2022 1226   CALCIUM 9.0 02/02/2017 1158   CALCIUM 9.3 07/24/2016 1007   ALKPHOS 78 10/14/2022 1226   ALKPHOS 110 (H) 02/02/2017 1158   ALKPHOS 104 07/24/2016 1007   AST 15 10/14/2022 1226   AST 13 07/24/2016 1007   ALT 13 10/14/2022 1226   ALT 26 02/02/2017 1158   ALT 15 07/24/2016 1007   BILITOT 0.7 10/14/2022 1226   BILITOT 0.55 07/24/2016 1007      Impression and Plan: Alexandra [name] is 72 year old [place] female. She has essential thrombocythemia. She actually is Calreticulin positive.  Her platelet count is doing quite nicely.  We have decreased her dose a couple months ago.  I am glad that the 4 mg anagrelide dose is working for her.  We will not make another change right now.    We will plan to get her back in 2 more months.  Again, we are going to decrease the anagrelide down to 4 mg a day.     Josph Macho, MD 8/14/20241:46 PM

## 2022-10-23 ENCOUNTER — Other Ambulatory Visit: Payer: Self-pay | Admitting: Hematology & Oncology

## 2022-10-23 DIAGNOSIS — D473 Essential (hemorrhagic) thrombocythemia: Secondary | ICD-10-CM

## 2022-10-23 DIAGNOSIS — D508 Other iron deficiency anemias: Secondary | ICD-10-CM

## 2022-11-03 ENCOUNTER — Other Ambulatory Visit: Payer: Self-pay | Admitting: Hematology & Oncology

## 2022-11-03 DIAGNOSIS — D473 Essential (hemorrhagic) thrombocythemia: Secondary | ICD-10-CM

## 2022-11-03 DIAGNOSIS — D508 Other iron deficiency anemias: Secondary | ICD-10-CM

## 2022-11-12 NOTE — Progress Notes (Signed)
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  Endocervical lesion thought to represent a polyp measures 7 mm.  Endometrium is asymmetrically thickened measuring up to 22 mm, noted to be cystic and vascular concerning for possible polyps.  Moderate free fluid noted.  Neither ovary well appreciated.    10/10/2021 Initial Biopsy   EMB: benign endometrial polyp, scant benign endocervix.    11/18/2021 Surgery   Endocervical polypectomy, endometrial polypectomy, and D&C. Findings at the time of surgery included an enlarged anteverted uterus, no adnexal masses. Cervical polyp noted. On hysteroscopy, multiple endometrial polyps with some thickening of the uterine wall noted. Final pathology revealed benign cervical polyp with metaplastic changes. Endometrial polyp revealed high-grade serous carcinoma as did the endometrial curettage specimen.    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 Results of the tumor marker test revealed normal level, 17.1.   12/05/2021 Imaging   CT C/A/P: 1. Heterogeneity of the uterus and cervix with endometrial fluid, compatible with the provided history of endometrial cancer. No evidence of metastatic disease. 2. Small pericardial effusion. 3. Liver may be mildly steatotic. 4. Retroareolar left breast nodule measures 1.5 cm. Patient underwent diagnostic left mammogram and ultrasound on 11/21/2021. 5. Overall dense appearance of the bones, compatible with a known history of myeloproliferative neoplasm. 6.  Aortic atherosclerosis (ICD10-I70.0).    12/24/2021 Surgery   Robotic-assisted lysis of adhesions, total hysterectomy with bilateral salpingoophorectomy, SLN biopsy bilaterally, resection of peritoneal lesions, omentectomy    Findings:  On EUA, 10-12 cm moderately mobile uterus. 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 Normal appearing liver otherwise, stomach, and diaphragm. Normal appearing omentum. Some adhesions of the cecum and ascending colon to the right abdominal sidewall and of the appendix to the right IP ligament and medial aspect of the broad ligament. Otherwise normal appearing small and large bowel. Small volume ascites in the pelvis. Normal appearing adnexa. No obvious adenopathy. Mapping successful to right presacral and left external iliac SLN. Some adhesions between the bladder peritoneum and the anterior uterus. Two small (<1-2 cm) intramural anterior LUS fibroids. Uterus somewhat enlarged and bulbous at the fundus. Numerous bulbous/cystic appearing lesions along the peritoneum - posterior cul de sac, uterosacral ligaments, anterior cul de sac. Findings most c/w endosalpingosis versus endometriosis. Frozen section of some of these lesions sent and not c/w metastatic clear cell carcinoma, defer further characterization to final pathology.     12/24/2021 Pathology Results   Serous endometrial cancer Confined to endometrium Negative SLNs, no LVI Washings negative Peritoneal implant - negative for carcinoma Omentum negative MMR intact, MS-stable, HER2 3+    Interval History: Doing well.  Denies any vaginal bleeding or discharge.  Reports normal bowel and bladder function.  Denies any abdominal or pelvic pain.  Past Medical/Surgical History: Past Medical History:  Diagnosis Date   Blood dyscrasia    Diabetes mellitus without complication (HCC)    Goals of care, counseling/discussion 12/15/2019   Heart murmur    History of  kidney stones  5-6 yrs ago. 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  10/30/2021   DILATATION & CURETTAGE/HYSTEROSCOPY WITH MYOSURE N/A 11/18/2021   Procedure: DILATATION & CURETTAGE/HYSTEROSCOPY WITH MYOSURE;  Surgeon: Romualdo Bolk, MD;  Location: [facility];  Service: Gynecology;  Laterality: N/A;   ECTOPIC PREGNANCY SURGERY  03/02/1982   salpingostomy   ROBOTIC ASSISTED LAPAROSCOPIC LYSIS OF ADHESION N/A 12/24/2021   Procedure: XI ROBOTIC ASSISTED LAPAROSCOPIC LYSIS OF ADHESION;  Surgeon: Carver Fila, MD;  Location: [facility];  Service: Gynecology;  Laterality: N/A;   SENTINEL NODE BIOPSY Bilateral 12/24/2021   Procedure: SENTINEL NODE BIOPSY;  Surgeon: Carver Fila, MD;  Location: [facility];  Service: Gynecology;  Laterality: Bilateral;    Family History  Problem Relation Age of Onset   Heart disease Mother        died age 41   Kidney disease Mother        family hx   Heart attack Father 63       massive   Hypertension Sister    Hypertension Sister    Hypertension Sister    Hypertension Sister    Arthritis Other        family hx   Diabetes Other        family hx   Stroke Other         dialysis   Colon cancer Neg Hx    Breast cancer Neg Hx    Ovarian cancer Neg Hx    Endometrial cancer Neg Hx    Pancreatic cancer Neg Hx    Prostate cancer Neg Hx     Social History   Socioeconomic History   Marital status: Married    Spouse name: Not on file   Number of children: Not on file   Years of education: Not on file   Highest education level: Not on file  Occupational History   Occupation: retired   Occupation: part-time works for her church  Tobacco Use   Smoking status:  Never   Smokeless tobacco: Never   Tobacco comments:    never used tobacco  Vaping Use   Vaping status: Never Used  Substance and Sexual Activity   Alcohol use: No    Alcohol/week: 0.0 standard drinks of alcohol   Drug use: No   Sexual activity: Not Currently    Partners: Male    Birth control/protection: Post-menopausal  Other Topics Concern   Not on file  Social History Narrative   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   No tobacco no ethoh little caffiene .    Social Determinants of Health   Financial Resource Strain: Low Risk  (01/27/2022)   Overall Financial Resource Strain (CARDIA)    Difficulty of Paying Living Expenses: Not hard at all  Food Insecurity: No Food Insecurity (01/27/2022)   Hunger Vital Sign    Worried About Running Out of Food in the Last Year: Never true    Ran Out of Food in the Last Year: Never true  Transportation Needs: No Transportation Needs (01/27/2022)   PRAPARE - Transportation  Lack of Transportation (Medical): No    Lack of Transportation (Non-Medical): No  Physical Activity: Insufficiently Active (01/27/2022)   Exercise Vital Sign    Days of Exercise per Week: 3 days    Minutes of Exercise per Session: 30 min  Stress: No Stress Concern Present (01/27/2022)   [employer] of Occupational Health - Occupational Stress Questionnaire    Feeling of Stress : Not at all  Social Connections: Socially Integrated (01/27/2022)   Social Connection and Isolation Panel [NHANES]    Frequency of Communication with Friends and Family: More than three times a week    Frequency of Social Gatherings with Friends and Family: More than three times a week    Attends Religious Services: More than 4 times per year    Active Member of [employer] or Organizations: Yes    Attends Engineer, structural: More than 4 times per year    Marital Status: Married    Current Medications:  Current Outpatient Medications:     amLODipine (NORVASC) 10 MG tablet, TAKE 1 TABLET BY MOUTH EVERY DAY, Disp: 90 tablet, Rfl: 0   anagrelide (AGRYLIN) 1 MG capsule, Take 4 capsules (4 mg total) by mouth daily., Disp: 120 capsule, Rfl: 5   aspirin EC 81 MG tablet, Take 81 mg by mouth every evening., Disp: , Rfl:    Fe Fum-FePoly-Vit C-Vit B3 (INTEGRA) 62.5-62.5-40-3 MG CAPS, TAKE 1 CAPSULE BY MOUTH DAILY, Disp: 30 capsule, Rfl: 8   folic acid (FOLVITE) 1 MG tablet, Take 2 tablets by mouth once daily, Disp: 60 tablet, Rfl: 0   lisinopril (ZESTRIL) 20 MG tablet, TAKE 2 TABLETS BY MOUTH EVERY DAY, Disp: 180 tablet, Rfl: 3   Nebivolol HCl 20 MG TABS, TAKE 1 TABLET(20 MG) BY MOUTH DAILY, Disp: 90 tablet, Rfl: 1   Vitamin D, Ergocalciferol, (DRISDOL) 1.25 MG (50000 UNIT) CAPS capsule, TAKE 1 CAPSULE BY MOUTH 1 TIME A WEEK, Disp: 12 capsule, Rfl: 2  Review of Systems: Denies appetite changes, fevers, chills, fatigue, unexplained weight changes. Denies hearing loss, neck lumps or masses, mouth sores, ringing in ears or voice changes. Denies cough or wheezing.  Denies shortness of breath. Denies chest pain or palpitations. Denies leg swelling. Denies abdominal distention, pain, blood in stools, constipation, diarrhea, nausea, vomiting, or early satiety. Denies pain with intercourse, dysuria, frequency, hematuria or incontinence. Denies hot flashes, pelvic pain, vaginal bleeding or vaginal discharge.   Denies joint pain, back pain or muscle pain/cramps. Denies itching, rash, or wounds. Denies dizziness, headaches, numbness or seizures. Denies swollen lymph nodes or glands, denies easy bruising or bleeding. Denies anxiety, depression, confusion, or decreased concentration.  Physical Exam: BP 133/70 (BP Location: Left Arm, Patient Position: Sitting)   Pulse 92   Temp 97.7 F (36.5 C) (Oral)   Resp 18   Ht 5\' 11"  (1.803 m)   Wt 133 lb (60.3 kg)   SpO2 100%   BMI 18.55 kg/m  General: Alert, oriented, no acute distress. HEENT:  Normocephalic, atraumatic, sclera anicteric. Chest: Clear to auscultation bilaterally.  No wheezes or rhonchi. Cardiovascular: Regular rate and rhythm, no murmurs. Abdomen: soft, nontender.  Normoactive bowel sounds.  No masses or hepatosplenomegaly appreciated.  Well-healed incisions. Extremities: Grossly normal range of motion.  Warm, well perfused.  No edema bilaterally. Skin: No rashes or lesions noted. Lymphatics: No cervical, supraclavicular, or inguinal adenopathy. GU: Normal appearing external genitalia without erythema, excoriation, or lesions.  Speculum exam reveals mildly atrophic vaginal mucosa.  Cuff is intact, mild discoloration  along the entire prior suture line, no masses or atypical vascularity.  Bimanual exam reveals cuff intact, no nodularity.  Rectovaginal exam confirms findings, no masses palpated.  Laboratory & Radiologic Studies: Component Ref Range & Units 3 mo ago 4 mo ago 6 mo ago 7 mo ago 11 mo ago  Cancer Antigen (CA) 125 0.0 - 38.1 U/mL 6.6 6.3 CM 7.1 CM 7.2 CM 17.1 CM   CT A/P 08/10/22:  1. Interval hysterectomy. No evidence of local recurrence. 2. Cystic appearing lesions of the left liver dome, largest of the two lesions is increased in size when compared with the prior exam. Recommend further evaluation with MRI of the abdomen with and without contrast. 3. Unchanged diffuse osseous sclerosis, consistent with history of myeloproliferative disease.  MRI abd 08/2022: 1. Multiple hepatic cysts. The largest lesion in the left hepatic dome segment 4A appears enlarged since the prior study from October 2023; however, no interval development of aggressive features. No suspicious liver lesion. 2. No metastatic carcinoma in the abdomen. 3. Multiple other nonacute observations, as described above.  Assessment & Plan: Alexandra [name] is a 71 y.o. 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 Surgery 11/2021. Tumor  confined to endometrium, negative washings. MMR intact, MS-stable, Her 2 +.   The patient is doing very well, is NED on exam today.    Patient again prefers to get CA125 when she sees her medical oncologist next month.  Per NCCN surveillance recommendations, in the setting of high risk disease, we will continue with visits every 3 months for the first 2-3 years before transitioning to 23-month surveillance visits.  We again reviewed signs and symptoms that would be concerning for cancer recurrence and I stressed the importance of calling if she develops any of these before her next visit.  22 minutes of total time was spent for this patient encounter, including preparation, face-to-face counseling with the patient and coordination of care, and documentation of the encounter.  Eugene Garnet, MD  Division of Gynecologic Oncology  Department of Obstetrics and Gynecology  [facility]

## 2022-11-13 ENCOUNTER — Encounter: Payer: Self-pay | Admitting: Gynecologic Oncology

## 2022-11-13 ENCOUNTER — Inpatient Hospital Stay: Payer: Medicare PPO | Attending: Hematology & Oncology | Admitting: Gynecologic Oncology

## 2022-11-13 VITALS — BP 133/70 | HR 92 | Temp 97.7°F | Resp 18 | Ht 71.0 in | Wt 133.0 lb

## 2022-11-13 DIAGNOSIS — Z90722 Acquired absence of ovaries, bilateral: Secondary | ICD-10-CM | POA: Insufficient documentation

## 2022-11-13 DIAGNOSIS — Z90710 Acquired absence of both cervix and uterus: Secondary | ICD-10-CM | POA: Diagnosis not present

## 2022-11-13 DIAGNOSIS — C549 Malignant neoplasm of corpus uteri, unspecified: Secondary | ICD-10-CM

## 2022-11-13 DIAGNOSIS — C541 Malignant neoplasm of endometrium: Secondary | ICD-10-CM | POA: Diagnosis not present

## 2022-11-13 DIAGNOSIS — Z8542 Personal history of malignant neoplasm of other parts of uterus: Secondary | ICD-10-CM | POA: Insufficient documentation

## 2022-11-13 NOTE — Patient Instructions (Signed)
It was good to see you today.  I do not see or feel any evidence of cancer recurrence on your exam.  I will see you for follow-up in 3 months.  As always, if you develop any new and concerning symptoms before your next visit, please call to see me sooner.  

## 2022-11-24 ENCOUNTER — Other Ambulatory Visit: Payer: Self-pay | Admitting: Internal Medicine

## 2022-11-24 DIAGNOSIS — Z1231 Encounter for screening mammogram for malignant neoplasm of breast: Secondary | ICD-10-CM

## 2022-11-25 ENCOUNTER — Ambulatory Visit
Admission: RE | Admit: 2022-11-25 | Discharge: 2022-11-25 | Disposition: A | Discharge status: Home / Self Care | Payer: Medicare PPO | Source: Ambulatory Visit | Attending: Internal Medicine | Admitting: Internal Medicine

## 2022-11-25 ENCOUNTER — Other Ambulatory Visit: Payer: Self-pay | Admitting: Internal Medicine

## 2022-11-25 DIAGNOSIS — Z1231 Encounter for screening mammogram for malignant neoplasm of breast: Secondary | ICD-10-CM

## 2022-12-06 ENCOUNTER — Other Ambulatory Visit: Payer: Self-pay | Admitting: Hematology & Oncology

## 2022-12-06 DIAGNOSIS — D473 Essential (hemorrhagic) thrombocythemia: Secondary | ICD-10-CM

## 2022-12-06 DIAGNOSIS — D508 Other iron deficiency anemias: Secondary | ICD-10-CM

## 2022-12-15 ENCOUNTER — Inpatient Hospital Stay: Payer: Medicare PPO | Attending: Hematology & Oncology

## 2022-12-15 ENCOUNTER — Other Ambulatory Visit: Payer: Self-pay

## 2022-12-15 ENCOUNTER — Encounter: Payer: Self-pay | Admitting: Hematology & Oncology

## 2022-12-15 ENCOUNTER — Inpatient Hospital Stay (HOSPITAL_BASED_OUTPATIENT_CLINIC_OR_DEPARTMENT_OTHER): Payer: Medicare PPO | Admitting: Hematology & Oncology

## 2022-12-15 VITALS — BP 132/67 | HR 82 | Temp 98.0°F | Resp 16 | Ht 71.0 in | Wt 130.0 lb

## 2022-12-15 DIAGNOSIS — Z7982 Long term (current) use of aspirin: Secondary | ICD-10-CM | POA: Diagnosis not present

## 2022-12-15 DIAGNOSIS — Z90710 Acquired absence of both cervix and uterus: Secondary | ICD-10-CM | POA: Insufficient documentation

## 2022-12-15 DIAGNOSIS — Z9079 Acquired absence of other genital organ(s): Secondary | ICD-10-CM | POA: Insufficient documentation

## 2022-12-15 DIAGNOSIS — Z90722 Acquired absence of ovaries, bilateral: Secondary | ICD-10-CM | POA: Insufficient documentation

## 2022-12-15 DIAGNOSIS — D75839 Thrombocytosis, unspecified: Secondary | ICD-10-CM | POA: Diagnosis not present

## 2022-12-15 DIAGNOSIS — D631 Anemia in chronic kidney disease: Secondary | ICD-10-CM | POA: Diagnosis not present

## 2022-12-15 DIAGNOSIS — Z79899 Other long term (current) drug therapy: Secondary | ICD-10-CM | POA: Diagnosis not present

## 2022-12-15 DIAGNOSIS — D473 Essential (hemorrhagic) thrombocythemia: Secondary | ICD-10-CM | POA: Diagnosis not present

## 2022-12-15 DIAGNOSIS — C549 Malignant neoplasm of corpus uteri, unspecified: Secondary | ICD-10-CM

## 2022-12-15 DIAGNOSIS — N189 Chronic kidney disease, unspecified: Secondary | ICD-10-CM | POA: Insufficient documentation

## 2022-12-15 DIAGNOSIS — Z8542 Personal history of malignant neoplasm of other parts of uterus: Secondary | ICD-10-CM | POA: Diagnosis not present

## 2022-12-15 LAB — CBC WITH DIFFERENTIAL (CANCER CENTER ONLY)
Abs Immature Granulocytes: 0.04 10*3/uL (ref 0.00–0.07)
Basophils Absolute: 0.1 10*3/uL (ref 0.0–0.1)
Basophils Relative: 1 %
Eosinophils Absolute: 0.1 10*3/uL (ref 0.0–0.5)
Eosinophils Relative: 1 %
HCT: 25.3 % — ABNORMAL LOW (ref 36.0–46.0)
Hemoglobin: 7.9 g/dL — ABNORMAL LOW (ref 12.0–15.0)
Immature Granulocytes: 1 %
Lymphocytes Relative: 22 %
Lymphs Abs: 1.4 10*3/uL (ref 0.7–4.0)
MCH: 25.4 pg — ABNORMAL LOW (ref 26.0–34.0)
MCHC: 31.2 g/dL (ref 30.0–36.0)
MCV: 81.4 fL (ref 80.0–100.0)
Monocytes Absolute: 0.6 10*3/uL (ref 0.1–1.0)
Monocytes Relative: 9 %
Neutro Abs: 4.3 10*3/uL (ref 1.7–7.7)
Neutrophils Relative %: 66 %
Platelet Count: 352 10*3/uL (ref 150–400)
RBC: 3.11 MIL/uL — ABNORMAL LOW (ref 3.87–5.11)
RDW: 19.1 % — ABNORMAL HIGH (ref 11.5–15.5)
WBC Count: 6.4 10*3/uL (ref 4.0–10.5)
nRBC: 0.3 % — ABNORMAL HIGH (ref 0.0–0.2)

## 2022-12-15 LAB — CMP (CANCER CENTER ONLY)
ALT: 11 U/L (ref 0–44)
AST: 14 U/L — ABNORMAL LOW (ref 15–41)
Albumin: 4 g/dL (ref 3.5–5.0)
Alkaline Phosphatase: 84 U/L (ref 38–126)
Anion gap: 8 (ref 5–15)
BUN: 39 mg/dL — ABNORMAL HIGH (ref 8–23)
CO2: 28 mmol/L (ref 22–32)
Calcium: 9.7 mg/dL (ref 8.9–10.3)
Chloride: 107 mmol/L (ref 98–111)
Creatinine: 1.42 mg/dL — ABNORMAL HIGH (ref 0.44–1.00)
GFR, Estimated: 39 mL/min — ABNORMAL LOW (ref >60)
Glucose, Bld: 120 mg/dL — ABNORMAL HIGH (ref 70–99)
Potassium: 4.5 mmol/L (ref 3.5–5.1)
Sodium: 143 mmol/L (ref 135–145)
Total Bilirubin: 0.5 mg/dL (ref 0.3–1.2)
Total Protein: 7.2 g/dL (ref 6.5–8.1)

## 2022-12-15 LAB — IRON AND IRON BINDING CAPACITY (CC-WL,HP ONLY)
Iron: 94 ug/dL (ref 28–170)
Saturation Ratios: 37 % — ABNORMAL HIGH (ref 10.4–31.8)
TIBC: 256 ug/dL (ref 250–450)
UIBC: 162 ug/dL (ref 148–442)

## 2022-12-15 LAB — LACTATE DEHYDROGENASE: LDH: 217 U/L — ABNORMAL HIGH (ref 98–192)

## 2022-12-15 LAB — FERRITIN: Ferritin: 294 ng/mL (ref 11–307)

## 2022-12-15 NOTE — Progress Notes (Signed)
Plan Alexandra [name]  It is a [employer].  Hematology and Oncology Follow Up Visit  Alexandra [name] [id number] 1950-09-26 72 y.o. 12/15/2022   Principle Diagnosis:  Essential thrombocythemia -Calreticulin (+)  High-grade serous carcinoma of the uterus  -- stage 1 ([id number]) Anemia of erythropoietin deficiency   Current Therapy:   Anagrelide 4 mg p.o. daily --changed on 08/14/2022 Folic acid 2 mg p.o. daily  Aspirin 81 mg p.o. daily  Procrit 40,000 units SQ weekly for hemoglobin less than 10-patient declined to date S/p TAH-BSO -- 12/24/2021  Interim History:  [name] is back for followup.  She is doing quite well.  Unfortunately, there is a first comes who passed away down in [place].  She had to go to the funeral.  She has had no problems with the anagrelide.  She is done incredibly well with this.  She was recently seen by gynecologic oncology.  At the last scan that she had that were in June and July.  Everything looked fine.  She has had no problems with cough or shortness of breath.  She has had no obvious bleeding.  She has had no change in bowel or bladder habits.  She has had no leg swelling.  She has had no headache.  There has been no nausea or vomiting.  She is enjoying the Fall weather.  She gets out and walks.  Overall, I would have said that her performance status for now is probably ECOG 0.    Medications:  Current Outpatient Medications:    amLODipine (NORVASC) 10 MG tablet, TAKE 1 TABLET BY MOUTH EVERY DAY, Disp: 90 tablet, Rfl: 0   anagrelide (AGRYLIN) 1 MG capsule, Take 4 capsules (4 mg total) by mouth daily., Disp: 120 capsule, Rfl: 5   aspirin EC 81 MG tablet, Take 81 mg by mouth every evening., Disp: , Rfl:    Fe Fum-FePoly-Vit C-Vit B3 (INTEGRA) 62.5-62.5-40-3 MG CAPS, TAKE 1 CAPSULE BY MOUTH DAILY, Disp: 30 capsule, Rfl: 8   folic acid (FOLVITE) 1 MG tablet, Take 2 tablets by mouth once daily, Disp: 60 tablet, Rfl: 0   lisinopril (ZESTRIL) 20 MG  tablet, TAKE 2 TABLETS BY MOUTH EVERY DAY, Disp: 180 tablet, Rfl: 3   Nebivolol HCl 20 MG TABS, TAKE 1 TABLET(20 MG) BY MOUTH DAILY, Disp: 90 tablet, Rfl: 1   Vitamin D, Ergocalciferol, (DRISDOL) 1.25 MG (50000 UNIT) CAPS capsule, TAKE 1 CAPSULE BY MOUTH 1 TIME A WEEK, Disp: 12 capsule, Rfl: 2  Allergies: No Known Allergies  Past Medical History, Surgical history, Social history, and Family History were reviewed and updated.  Review of Systems: Review of Systems  Constitutional: Negative.   HENT: Negative.    Eyes: Negative.   Respiratory: Negative.    Cardiovascular: Negative.   Gastrointestinal: Negative.   Genitourinary: Negative.   Musculoskeletal: Negative.   Skin: Negative.   Neurological: Negative.   Endo/Heme/Allergies: Negative.   Psychiatric/Behavioral: Negative.    Marland Kitchen  Physical Exam:  height is 5\' 11"  (1.803 m) and weight is 130 lb (59 kg). Her oral temperature is 98 F (36.7 C). Her blood pressure is 132/67 and her pulse is 82. Her respiration is 16 and oxygen saturation is 100%. =jh  Physical Exam Vitals reviewed.  HENT:     Head: Normocephalic and atraumatic.  Eyes:     Pupils: Pupils are equal, round, and reactive to light.  Cardiovascular:     Rate and Rhythm: Normal rate and regular rhythm.  Heart sounds: Normal heart sounds.  Pulmonary:     Effort: Pulmonary effort is normal.     Breath sounds: Normal breath sounds.  Abdominal:     General: Bowel sounds are normal.     Palpations: Abdomen is soft.     Comments: Abdominal exam shows well-healed laparoscopic scars.  There is no fluid wave.  There is no guarding or rebound tenderness.  There is no palpable hepatosplenomegaly.  Musculoskeletal:        General: No tenderness or deformity. Normal range of motion.     Cervical back: Normal range of motion.  Lymphadenopathy:     Cervical: No cervical adenopathy.  Skin:    General: Skin is warm and dry.     Findings: No erythema or rash.  Neurological:      Mental Status: She is alert and oriented to person, place, and time.  Psychiatric:        Behavior: Behavior normal.        Thought Content: Thought content normal.        Judgment: Judgment normal.      Lab Results  Component Value Date   WBC 6.4 12/15/2022   HGB 7.9 (L) 12/15/2022   HCT 25.3 (L) 12/15/2022   MCV 81.4 12/15/2022   PLT 352 12/15/2022     Chemistry      Component Value Date/Time   NA 143 12/15/2022 1011   NA 144 02/02/2017 1158   NA 140 07/24/2016 1007   K 4.5 12/15/2022 1011   K 3.7 02/02/2017 1158   K 3.6 07/24/2016 1007   CL 107 12/15/2022 1011   CL 103 02/02/2017 1158   CO2 28 12/15/2022 1011   CO2 29 02/02/2017 1158   CO2 26 07/24/2016 1007   BUN 39 (H) 12/15/2022 1011   BUN 20 02/02/2017 1158   BUN 30.3 (H) 07/24/2016 1007   CREATININE 1.42 (H) 12/15/2022 1011   CREATININE 0.9 02/02/2017 1158   CREATININE 1.1 07/24/2016 1007   GLU 129 02/02/2017 0000      Component Value Date/Time   CALCIUM 9.7 12/15/2022 1011   CALCIUM 9.0 02/02/2017 1158   CALCIUM 9.3 07/24/2016 1007   ALKPHOS 84 12/15/2022 1011   ALKPHOS 110 (H) 02/02/2017 1158   ALKPHOS 104 07/24/2016 1007   AST 14 (L) 12/15/2022 1011   AST 13 07/24/2016 1007   ALT 11 12/15/2022 1011   ALT 26 02/02/2017 1158   ALT 15 07/24/2016 1007   BILITOT 0.5 12/15/2022 1011   BILITOT 0.55 07/24/2016 1007      Impression and Plan: Alexandra [name] is 72 year old [place] female. She has essential thrombocythemia. She actually is Calreticulin positive.  I noted that her platelet count is trending up a little bit.  We will have to be careful with this.  I would not change the dose of anagrelide.  I am glad that the uterine cancer is not a problem.  She is getting very close follow-up with Dr. Pricilla Holm of Gynecologic Oncology.  We will plan to get her back after Thanksgiving.  I think if all looks good and then we will certainly get her through the Holidays.    Josph Macho,  MD 10/15/202410:58 AM

## 2022-12-16 LAB — CA 125: Cancer Antigen (CA) 125: 6.6 U/mL (ref 0.0–38.1)

## 2022-12-30 ENCOUNTER — Encounter: Payer: Self-pay | Admitting: Internal Medicine

## 2022-12-30 DIAGNOSIS — H40011 Open angle with borderline findings, low risk, right eye: Secondary | ICD-10-CM | POA: Diagnosis not present

## 2022-12-30 DIAGNOSIS — H401121 Primary open-angle glaucoma, left eye, mild stage: Secondary | ICD-10-CM | POA: Diagnosis not present

## 2022-12-30 LAB — HM DIABETES EYE EXAM

## 2023-01-18 ENCOUNTER — Other Ambulatory Visit: Payer: Self-pay | Admitting: Hematology & Oncology

## 2023-01-18 DIAGNOSIS — D473 Essential (hemorrhagic) thrombocythemia: Secondary | ICD-10-CM

## 2023-01-18 DIAGNOSIS — D508 Other iron deficiency anemias: Secondary | ICD-10-CM

## 2023-02-01 ENCOUNTER — Ambulatory Visit (INDEPENDENT_AMBULATORY_CARE_PROVIDER_SITE_OTHER): Payer: Medicare PPO

## 2023-02-01 VITALS — Ht 71.0 in | Wt 130.0 lb

## 2023-02-01 DIAGNOSIS — Z0000 Encounter for general adult medical examination without abnormal findings: Secondary | ICD-10-CM

## 2023-02-01 NOTE — Patient Instructions (Addendum)
Ms. Urciuoli , Thank you for taking time to come for your Medicare Wellness Visit. I appreciate your ongoing commitment to your health goals. Please review the following plan we discussed and let me know if I can assist you in the future.   Referrals/Orders/Follow-Ups/Clinician Recommendations:   This is a list of the screening recommended for you and due dates:  Health Maintenance  Topic Date Due   Hepatitis C Screening  Never done   DTaP/Tdap/Td vaccine (1 - Tdap) Never done   Zoster (Shingles) Vaccine (1 of 2) Never done   Pneumonia Vaccine (1 of 1 - PCV) Never done   Colon Cancer Screening  05/02/2019   Hemoglobin A1C  06/18/2022   Yearly kidney health urinalysis for diabetes  08/26/2022   Complete foot exam   08/26/2022   Flu Shot  10/01/2022   COVID-19 Vaccine (4 - 2023-24 season) 11/01/2022   Yearly kidney function blood test for diabetes  12/15/2023   Eye exam for diabetics  12/30/2023   Medicare Annual Wellness Visit  02/01/2024   Mammogram  11/24/2024   DEXA scan (bone density measurement)  Completed   HPV Vaccine  Aged Out    Advanced directives: (Declined) Advance directive discussed with you today. Even though you declined this today, please call our office should you change your mind, and we can give you the proper paperwork for you to fill out.  Next Medicare Annual Wellness Visit scheduled for next year: Yes

## 2023-02-01 NOTE — Progress Notes (Signed)
Subjective:   Alexandra [name] is a 72 y.o. female who presents for Medicare Annual (Subsequent) preventive examination.  Visit Complete: Virtual I connected with  Demetra Shiner on 02/01/23 by a audio enabled telemedicine application and verified that I am speaking with the correct person using two identifiers.  Patient Location: Home  Provider Location: Home Office  I discussed the limitations of evaluation and management by telemedicine. The patient expressed understanding and agreed to proceed.  Vital Signs: Because this visit was a virtual/telehealth visit, some criteria may be missing or patient reported. Any vitals not documented were not able to be obtained and vitals that have been documented are patient reported.    Cardiac Risk Factors include: advanced age (>10men, >66 women);diabetes mellitus;hypertension     Objective:    Today's Vitals   02/01/23 0850  Weight: 130 lb (59 kg)  Height: 5\' 11"  (1.803 m)   Body mass index is 18.13 kg/m.     02/01/2023    8:57 AM 12/15/2022   10:23 AM 10/14/2022   12:54 PM 08/14/2022   12:08 PM 07/03/2022   10:34 AM 05/15/2022    9:21 AM 04/03/2022   10:23 AM  Advanced Directives  Does Patient Have a Medical Advance Directive? No No No No No No No  Does patient want to make changes to medical advance directive?  No - Patient declined No - Patient declined No - Patient declined     Would patient like information on creating a medical advance directive? No - Patient declined  No - Patient declined No - Patient declined No - Patient declined No - Patient declined No - Patient declined    Current Medications (verified) Outpatient Encounter Medications as of 02/01/2023  Medication Sig   amLODipine (NORVASC) 10 MG tablet TAKE 1 TABLET BY MOUTH EVERY DAY   anagrelide (AGRYLIN) 1 MG capsule Take 4 capsules (4 mg total) by mouth daily.   aspirin EC 81 MG tablet Take 81 mg by mouth every evening.   Fe Fum-FePoly-Vit C-Vit B3 (INTEGRA)  62.5-62.5-40-3 MG CAPS TAKE 1 CAPSULE BY MOUTH DAILY   folic acid (FOLVITE) 1 MG tablet Take 2 tablets by mouth once daily   lisinopril (ZESTRIL) 20 MG tablet TAKE 2 TABLETS BY MOUTH EVERY DAY   Nebivolol HCl 20 MG TABS TAKE 1 TABLET(20 MG) BY MOUTH DAILY   Vitamin D, Ergocalciferol, (DRISDOL) 1.25 MG (50000 UNIT) CAPS capsule TAKE 1 CAPSULE BY MOUTH 1 TIME A WEEK   No facility-administered encounter medications on file as of 02/01/2023.    Allergies (verified) Patient has no known allergies.   History: Past Medical History:  Diagnosis Date   Blood dyscrasia    Diabetes mellitus without complication (HCC)    Goals of care, counseling/discussion 12/15/2019   Heart murmur    History of kidney stones    5-6 yrs ago. 10/30/2021   Hypertension    echo    Iron deficiency anemia, unspecified 03/15/2013   Myeloproliferative neoplasm (HCC) 12/15/2019   Thrombocytosis    Past Surgical History:  Procedure Laterality Date   ABDOMINAL HYSTERECTOMY     BONE MARROW BIOPSY     has had 3 of them but doesnt remember when.  10/30/2021   DILATATION & CURETTAGE/HYSTEROSCOPY WITH MYOSURE N/A 11/18/2021   Procedure: DILATATION & CURETTAGE/HYSTEROSCOPY WITH MYOSURE;  Surgeon: Romualdo Bolk, MD;  Location: [facility] [place];  Service: Gynecology;  Laterality: N/A;   ECTOPIC PREGNANCY SURGERY  03/02/1982   salpingostomy  ROBOTIC ASSISTED LAPAROSCOPIC LYSIS OF ADHESION N/A 12/24/2021   Procedure: XI ROBOTIC ASSISTED LAPAROSCOPIC LYSIS OF ADHESION;  Surgeon: Carver Fila, MD;  Location: [facility];  Service: Gynecology;  Laterality: N/A;   SENTINEL NODE BIOPSY Bilateral 12/24/2021   Procedure: SENTINEL NODE BIOPSY;  Surgeon: Carver Fila, MD;  Location: [facility];  Service: Gynecology;  Laterality: Bilateral;   Family History  Problem Relation Age of Onset   Heart disease Mother        died age 82   Kidney disease Mother        family hx   Heart attack Father 44        massive   Hypertension Sister    Hypertension Sister    Hypertension Sister    Hypertension Sister    Arthritis Other        family hx   Diabetes Other        family hx   Stroke Other         dialysis   Colon cancer Neg Hx    Breast cancer Neg Hx    Ovarian cancer Neg Hx    Endometrial cancer Neg Hx    Pancreatic cancer Neg Hx    Prostate cancer Neg Hx    Social History   Socioeconomic History   Marital status: Married    Spouse name: Not on file   Number of children: Not on file   Years of education: Not on file   Highest education level: Not on file  Occupational History   Occupation: retired   Occupation: part-time works for her church  Tobacco Use   Smoking status: Never   Smokeless tobacco: Never   Tobacco comments:    never used tobacco  Vaping Use   Vaping status: Never Used  Substance and Sexual Activity   Alcohol use: No    Alcohol/week: 0.0 standard drinks of alcohol   Drug use: No   Sexual activity: Not Currently    Partners: Male    Birth control/protection: Post-menopausal  Other Topics Concern   Not on file  Social History Narrative   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   No tobacco no ethoh little caffiene .    Social Determinants of Health   Financial Resource Strain: Low Risk  (02/01/2023)   Overall Financial Resource Strain (CARDIA)    Difficulty of Paying Living Expenses: Not hard at all  Food Insecurity: No Food Insecurity (02/01/2023)   Hunger Vital Sign    Worried About Running Out of Food in the Last Year: Never true    Ran Out of Food in the Last Year: Never true  Transportation Needs: No Transportation Needs (02/01/2023)   PRAPARE - Administrator, Civil Service (Medical): No    Lack of Transportation (Non-Medical): No  Physical Activity: Insufficiently Active (02/01/2023)   Exercise Vital Sign    Days of Exercise per Week: 3 days    Minutes of Exercise per Session: 30 min   Stress: No Stress Concern Present (02/01/2023)   [employer] of Occupational Health - Occupational Stress Questionnaire    Feeling of Stress : Not at all  Social Connections: Socially Integrated (02/01/2023)   Social Connection and Isolation Panel [NHANES]    Frequency of Communication with Friends and Family: More than three times a week    Frequency of Social Gatherings with  Friends and Family: More than three times a week    Attends Religious Services: More than 4 times per year    Active Member of Clubs or Organizations: Yes    Attends Engineer, structural: More than 4 times per year    Marital Status: Married    Tobacco Counseling Counseling given: Not Answered Tobacco comments: never used tobacco   Clinical Intake:  Pre-visit preparation completed: Yes  Pain : No/denies pain     BMI - recorded: 18.13 Nutritional Status: BMI <19  Underweight Nutritional Risks: None Diabetes: Yes CBG done?: No Did pt. bring in CBG monitor from home?: No  How often do you need to have someone help you when you read instructions, pamphlets, or other written materials from your doctor or pharmacy?: 1 - Never  Interpreter Needed?: No  Information entered by :: Theresa Mulligan LPN   Activities of Daily Living    02/01/2023    8:55 AM  In your present state of health, do you have any difficulty performing the following activities:  Hearing? 0  Vision? 0  Difficulty concentrating or making decisions? 0  Walking or climbing stairs? 0  Dressing or bathing? 0  Doing errands, shopping? 0  Preparing Food and eating ? N  Using the Toilet? N  In the past six months, have you accidently leaked urine? N  Do you have problems with loss of bowel control? N  Managing your Medications? N  Managing your Finances? N  Housekeeping or managing your Housekeeping? N    Patient Care Team: Panosh, Neta Mends, MD as PCP - General Myna Hidalgo Rose Phi, MD as Attending Physician (Hematology  and Oncology) Carlus Pavlov, MD as Consulting Physician (Internal Medicine)  Indicate any recent Medical Services you may have received from other than [facility] providers in the past year (date may be approximate).     Assessment:   This is a routine wellness examination for Alexandre.  Hearing/Vision screen Hearing Screening - Comments:: Denies hearing difficulties   Vision Screening - Comments:: Wears reading glasses - up to date with routine eye exams with  Dr Dione Booze   Goals Addressed               This Visit's Progress     Increase physical activity (pt-stated)         Depression Screen    02/01/2023    8:55 AM 01/27/2022    8:56 AM 08/25/2021   11:53 AM 12/25/2020    9:34 AM 03/20/2020   11:26 AM 12/26/2019    9:07 AM 10/18/2017   10:54 AM  PHQ 2/9 Scores  PHQ - 2 Score 0 0 0 0 0 0 0  PHQ- 9 Score   0        Fall Risk    02/01/2023    8:56 AM 08/25/2021   11:52 AM 12/25/2020    9:37 AM 03/20/2020   11:26 AM 12/26/2019    9:13 AM  Fall Risk   Falls in the past year? 0 0 0 0 0  Number falls in past yr: 0 0 0 0 0  Injury with Fall? 0 0 0  0  Risk for fall due to : No Fall Risks No Fall Risks Impaired vision    Follow up Falls prevention discussed Falls prevention discussed Falls prevention discussed  Falls prevention discussed    MEDICARE RISK AT HOME: Medicare Risk at Home Any stairs in or around the home?: No If so, are there any  without handrails?: No Home free of loose throw rugs in walkways, pet beds, electrical cords, etc?: Yes Adequate lighting in your home to reduce risk of falls?: Yes Life alert?: No Use of a cane, walker or w/c?: No Grab bars in the bathroom?: No Shower chair or bench in shower?: No Elevated toilet seat or a handicapped toilet?: No  TIMED UP AND GO:  Was the test performed?  No    Cognitive Function:    05/19/2016   11:40 AM  MMSE - Mini Mental State Exam  Not completed: --        02/01/2023    8:57 AM 01/27/2022     8:59 AM 12/25/2020    9:38 AM 12/26/2019    9:15 AM  6CIT Screen  What Year? 0 points 0 points 0 points 0 points  What month? 0 points 0 points 0 points 0 points  What time? 0 points 0 points 0 points   Count back from 20 0 points 0 points 0 points 0 points  Months in reverse 0 points 0 points 0 points 0 points  Repeat phrase 0 points 0 points 0 points 4 points  Total Score 0 points 0 points 0 points     Immunizations Immunization History  Administered Date(s) Administered   PFIZER(Purple Top)SARS-COV-2 Vaccination 05/25/2019, 06/19/2019, 10/09/2020   PPD Test 03/15/2017, 02/27/2020    TDAP status: Due, Education has been provided regarding the importance of this vaccine. Advised may receive this vaccine at local pharmacy or Health Dept. Aware to provide a copy of the vaccination record if obtained from local pharmacy or Health Dept. Verbalized acceptance and understanding.  Flu Vaccine status: Declined, Education has been provided regarding the importance of this vaccine but patient still declined. Advised may receive this vaccine at local pharmacy or Health Dept. Aware to provide a copy of the vaccination record if obtained from local pharmacy or Health Dept. Verbalized acceptance and understanding.  Pneumococcal vaccine status: Declined,  Education has been provided regarding the importance of this vaccine but patient still declined. Advised may receive this vaccine at local pharmacy or Health Dept. Aware to provide a copy of the vaccination record if obtained from local pharmacy or Health Dept. Verbalized acceptance and understanding.   Covid-19 vaccine status: Declined, Education has been provided regarding the importance of this vaccine but patient still declined. Advised may receive this vaccine at local pharmacy or Health Dept.or vaccine clinic. Aware to provide a copy of the vaccination record if obtained from local pharmacy or Health Dept. Verbalized acceptance and  understanding.  Qualifies for Shingles Vaccine? Yes   Zostavax completed No   Shingrix Completed?: No.    Education has been provided regarding the importance of this vaccine. Patient has been advised to call insurance company to determine out of pocket expense if they have not yet received this vaccine. Advised may also receive vaccine at local pharmacy or Health Dept. Verbalized acceptance and understanding.  Screening Tests Health Maintenance  Topic Date Due   Hepatitis C Screening  Never done   DTaP/Tdap/Td (1 - Tdap) Never done   Zoster Vaccines- Shingrix (1 of 2) Never done   Pneumonia Vaccine 53+ Years old (1 of 1 - PCV) Never done   Colonoscopy  05/02/2019   HEMOGLOBIN A1C  06/18/2022   Diabetic kidney evaluation - Urine ACR  08/26/2022   FOOT EXAM  08/26/2022   INFLUENZA VACCINE  10/01/2022   COVID-19 Vaccine (4 - 2023-24 season) 11/01/2022   Diabetic kidney  evaluation - eGFR measurement  12/15/2023   OPHTHALMOLOGY EXAM  12/30/2023   Medicare Annual Wellness (AWV)  02/01/2024   MAMMOGRAM  11/24/2024   DEXA SCAN  Completed   HPV VACCINES  Aged Out    Health Maintenance  Health Maintenance Due  Topic Date Due   Hepatitis C Screening  Never done   DTaP/Tdap/Td (1 - Tdap) Never done   Zoster Vaccines- Shingrix (1 of 2) Never done   Pneumonia Vaccine 78+ Years old (1 of 1 - PCV) Never done   Colonoscopy  05/02/2019   HEMOGLOBIN A1C  06/18/2022   Diabetic kidney evaluation - Urine ACR  08/26/2022   FOOT EXAM  08/26/2022   INFLUENZA VACCINE  10/01/2022   COVID-19 Vaccine (4 - 2023-24 season) 11/01/2022    Colorectal cancer screening: Referral to GI placed Deferred. Pt aware the office will call re: appt.  Mammogram status: Completed 11/25/22. Repeat every year  Bone Density status: Completed 07/20/16. Results reflect: Bone density results: NORMAL. Repeat every   years.     Additional Screening:  Hepatitis C Screening: does qualify; Completed Deferred  Vision  Screening: Recommended annual ophthalmology exams for early detection of glaucoma and other disorders of the eye. Is the patient up to date with their annual eye exam?  Yes  Who is the provider or what is the name of the office in which the patient attends annual eye exams? Dr Dione Booze If pt is not established with a provider, would they like to be referred to a provider to establish care? No .   Dental Screening: Recommended annual dental exams for proper oral hygiene  Diabetic Foot Exam: Diabetic Foot Exam: Overdue, Pt has been advised about the importance in completing this exam. Pt is scheduled for diabetic foot exam on Deferred.  Community Resource Referral / Chronic Care Management:  CRR required this visit?  No   CCM required this visit?  No     Plan:     I have personally reviewed and noted the following in the patient's chart:   Medical and social history Use of alcohol, tobacco or illicit drugs  Current medications and supplements including opioid prescriptions. Patient is not currently taking opioid prescriptions. Functional ability and status Nutritional status Physical activity Advanced directives List of other physicians Hospitalizations, surgeries, and ER visits in previous 12 months Vitals Screenings to include cognitive, depression, and falls Referrals and appointments  In addition, I have reviewed and discussed with patient certain preventive protocols, quality metrics, and best practice recommendations. A written personalized care plan for preventive services as well as general preventive health recommendations were provided to patient.     Tillie Rung, LPN   16/03/958   After Visit Summary: (MyChart) Due to this being a telephonic visit, the after visit summary with patients personalized plan was offered to patient via MyChart   Nurse Notes: None

## 2023-02-02 ENCOUNTER — Inpatient Hospital Stay: Payer: Medicare PPO

## 2023-02-02 ENCOUNTER — Ambulatory Visit: Payer: Medicare PPO | Admitting: Hematology & Oncology

## 2023-02-14 ENCOUNTER — Other Ambulatory Visit: Payer: Self-pay | Admitting: Hematology & Oncology

## 2023-02-14 DIAGNOSIS — D508 Other iron deficiency anemias: Secondary | ICD-10-CM

## 2023-02-14 DIAGNOSIS — D473 Essential (hemorrhagic) thrombocythemia: Secondary | ICD-10-CM

## 2023-02-15 ENCOUNTER — Encounter: Payer: Self-pay | Admitting: Family

## 2023-02-16 ENCOUNTER — Inpatient Hospital Stay: Payer: Medicare PPO | Admitting: Medical Oncology

## 2023-02-16 ENCOUNTER — Inpatient Hospital Stay: Payer: Medicare PPO

## 2023-02-22 ENCOUNTER — Inpatient Hospital Stay: Payer: Medicare PPO | Attending: Hematology & Oncology

## 2023-02-22 ENCOUNTER — Encounter: Payer: Self-pay | Admitting: Medical Oncology

## 2023-02-22 ENCOUNTER — Inpatient Hospital Stay (HOSPITAL_BASED_OUTPATIENT_CLINIC_OR_DEPARTMENT_OTHER): Payer: Medicare PPO | Admitting: Medical Oncology

## 2023-02-22 VITALS — BP 140/65 | HR 88 | Temp 97.5°F | Resp 18 | Ht 71.0 in | Wt 131.4 lb

## 2023-02-22 DIAGNOSIS — D473 Essential (hemorrhagic) thrombocythemia: Secondary | ICD-10-CM | POA: Insufficient documentation

## 2023-02-22 DIAGNOSIS — C541 Malignant neoplasm of endometrium: Secondary | ICD-10-CM | POA: Diagnosis not present

## 2023-02-22 DIAGNOSIS — Z79899 Other long term (current) drug therapy: Secondary | ICD-10-CM | POA: Diagnosis not present

## 2023-02-22 DIAGNOSIS — D500 Iron deficiency anemia secondary to blood loss (chronic): Secondary | ICD-10-CM

## 2023-02-22 DIAGNOSIS — Z9079 Acquired absence of other genital organ(s): Secondary | ICD-10-CM | POA: Diagnosis not present

## 2023-02-22 DIAGNOSIS — D471 Chronic myeloproliferative disease: Secondary | ICD-10-CM | POA: Diagnosis not present

## 2023-02-22 DIAGNOSIS — Z8542 Personal history of malignant neoplasm of other parts of uterus: Secondary | ICD-10-CM | POA: Insufficient documentation

## 2023-02-22 DIAGNOSIS — D631 Anemia in chronic kidney disease: Secondary | ICD-10-CM | POA: Diagnosis not present

## 2023-02-22 DIAGNOSIS — Z90710 Acquired absence of both cervix and uterus: Secondary | ICD-10-CM | POA: Insufficient documentation

## 2023-02-22 DIAGNOSIS — Z90722 Acquired absence of ovaries, bilateral: Secondary | ICD-10-CM | POA: Diagnosis not present

## 2023-02-22 LAB — CBC WITH DIFFERENTIAL (CANCER CENTER ONLY)
Abs Immature Granulocytes: 0.04 10*3/uL (ref 0.00–0.07)
Basophils Absolute: 0 10*3/uL (ref 0.0–0.1)
Basophils Relative: 1 %
Eosinophils Absolute: 0.1 10*3/uL (ref 0.0–0.5)
Eosinophils Relative: 2 %
HCT: 26.2 % — ABNORMAL LOW (ref 36.0–46.0)
Hemoglobin: 8 g/dL — ABNORMAL LOW (ref 12.0–15.0)
Immature Granulocytes: 1 %
Lymphocytes Relative: 21 %
Lymphs Abs: 1.3 10*3/uL (ref 0.7–4.0)
MCH: 24.9 pg — ABNORMAL LOW (ref 26.0–34.0)
MCHC: 30.5 g/dL (ref 30.0–36.0)
MCV: 81.6 fL (ref 80.0–100.0)
Monocytes Absolute: 0.5 10*3/uL (ref 0.1–1.0)
Monocytes Relative: 9 %
Neutro Abs: 4.1 10*3/uL (ref 1.7–7.7)
Neutrophils Relative %: 66 %
Platelet Count: 431 10*3/uL — ABNORMAL HIGH (ref 150–400)
RBC: 3.21 MIL/uL — ABNORMAL LOW (ref 3.87–5.11)
RDW: 19.8 % — ABNORMAL HIGH (ref 11.5–15.5)
WBC Count: 6.1 10*3/uL (ref 4.0–10.5)
nRBC: 0 % (ref 0.0–0.2)

## 2023-02-22 LAB — CMP (CANCER CENTER ONLY)
ALT: 11 U/L (ref 0–44)
AST: 14 U/L — ABNORMAL LOW (ref 15–41)
Albumin: 4.2 g/dL (ref 3.5–5.0)
Alkaline Phosphatase: 88 U/L (ref 38–126)
Anion gap: 7 (ref 5–15)
BUN: 22 mg/dL (ref 8–23)
CO2: 29 mmol/L (ref 22–32)
Calcium: 9.6 mg/dL (ref 8.9–10.3)
Chloride: 107 mmol/L (ref 98–111)
Creatinine: 1.05 mg/dL — ABNORMAL HIGH (ref 0.44–1.00)
GFR, Estimated: 56 mL/min — ABNORMAL LOW (ref >60)
Glucose, Bld: 117 mg/dL — ABNORMAL HIGH (ref 70–99)
Potassium: 4 mmol/L (ref 3.5–5.1)
Sodium: 143 mmol/L (ref 135–145)
Total Bilirubin: 0.6 mg/dL (ref <1.2)
Total Protein: 7.2 g/dL (ref 6.5–8.1)

## 2023-02-22 LAB — LACTATE DEHYDROGENASE: LDH: 231 U/L — ABNORMAL HIGH (ref 98–192)

## 2023-02-22 LAB — SAVE SMEAR(SSMR), FOR PROVIDER SLIDE REVIEW

## 2023-02-22 LAB — IRON AND IRON BINDING CAPACITY (CC-WL,HP ONLY)
Iron: 86 ug/dL (ref 28–170)
Saturation Ratios: 33 % — ABNORMAL HIGH (ref 10.4–31.8)
TIBC: 258 ug/dL (ref 250–450)
UIBC: 172 ug/dL (ref 148–442)

## 2023-02-22 LAB — FERRITIN: Ferritin: 254 ng/mL (ref 11–307)

## 2023-02-22 NOTE — Progress Notes (Signed)
Hematology and Oncology Follow Up Visit  Alexandra [name] [id number] 1950-07-17 72 y.o. 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 daily --changed on 08/14/2022 Folic acid 2 mg p.o. daily  Aspirin 81 mg p.o. daily  Procrit 40,000 units SQ weekly for hemoglobin less than 10-patient declined to date S/p TAH-BSO -- 12/24/2021  Interim History:  Alexandra [name] is back for followup.   She has had no problems with the anagrelide.  She is done incredibly well with this. She has not had any side effects.   She did Procrit prior to past surgeries to help elevated Hgb but does not take this normally. She does not wish to have this today.   She continues to be followed by gynecologic oncology. She has an appointment this Friday.   She has had no problems with cough or shortness of breath.  She has had no obvious bleeding.  She has had no change in bowel or bladder habits.  She has had no leg swelling.  She has had no headache.  There has been no nausea or vomiting.  Appetite and hydration are good  Overall, I would have said that her performance status for now is probably ECOG 0.    Wt Readings from Last 3 Encounters:  02/22/23 131 lb 6.4 oz (59.6 kg)  02/01/23 130 lb (59 kg)  12/15/22 130 lb (59 kg)   Medications:  Current Outpatient Medications:    amLODipine (NORVASC) 10 MG tablet, TAKE 1 TABLET BY MOUTH EVERY DAY, Disp: 90 tablet, Rfl: 0   anagrelide (AGRYLIN) 1 MG capsule, Take 4 capsules (4 mg total) by mouth daily., Disp: 120 capsule, Rfl: 5   aspirin EC 81 MG tablet, Take 81 mg by mouth every evening., Disp: , Rfl:    Fe Fum-FePoly-Vit C-Vit B3 (INTEGRA) 62.5-62.5-40-3 MG CAPS, TAKE 1 CAPSULE BY MOUTH DAILY, Disp: 30 capsule, Rfl: 8   folic acid (FOLVITE) 1 MG tablet, Take 2 tablets by mouth once daily,  Disp: 60 tablet, Rfl: 0   lisinopril (ZESTRIL) 20 MG tablet, TAKE 2 TABLETS BY MOUTH EVERY DAY, Disp: 180 tablet, Rfl: 3   Nebivolol HCl 20 MG TABS, TAKE 1 TABLET(20 MG) BY MOUTH DAILY, Disp: 90 tablet, Rfl: 1   Vitamin D, Ergocalciferol, (DRISDOL) 1.25 MG (50000 UNIT) CAPS capsule, TAKE 1 CAPSULE BY MOUTH 1 TIME A WEEK, Disp: 12 capsule, Rfl: 2  Allergies: No Known Allergies  Past Medical History, Surgical history, Social history, and Family History were reviewed and updated.  Review of Systems: Review of Systems  Constitutional: Negative.   HENT: Negative.    Eyes: Negative.   Respiratory: Negative.    Cardiovascular: Negative.   Gastrointestinal: Negative.   Genitourinary: Negative.   Musculoskeletal: Negative.   Skin: Negative.   Neurological: Negative.   Endo/Heme/Allergies: Negative.   Psychiatric/Behavioral: Negative.    Marland Kitchen  Physical Exam:  height is 5\' 11"  (1.803 m) and weight is 131 lb 6.4 oz (59.6 kg). Her oral temperature is 97.5 F (36.4 C) (abnormal). Her blood pressure is 140/65 (abnormal) and her pulse is 88. Her respiration is 18 and oxygen saturation is 100%. =jh  Physical Exam Vitals reviewed.  HENT:     Head: Normocephalic and atraumatic.  Eyes:     Pupils: Pupils are equal, round, and reactive to light.  Cardiovascular:  Rate and Rhythm: Normal rate and regular rhythm.     Heart sounds: Normal heart sounds.  Pulmonary:     Effort: Pulmonary effort is normal.     Breath sounds: Normal breath sounds.  Abdominal:     General: Bowel sounds are normal.     Palpations: Abdomen is soft.     Comments: Abdominal exam shows well-healed laparoscopic scars.  There is no fluid wave.  There is no guarding or rebound tenderness.  There is no palpable hepatosplenomegaly.  Musculoskeletal:        General: No tenderness or deformity. Normal range of motion.     Cervical back: Normal range of motion.  Lymphadenopathy:     Cervical: No cervical adenopathy.  Skin:     General: Skin is warm and dry.     Findings: No erythema or rash.  Neurological:     Mental Status: She is alert and oriented to person, place, and time.  Psychiatric:        Behavior: Behavior normal.        Thought Content: Thought content normal.        Judgment: Judgment normal.      Lab Results  Component Value Date   WBC 6.1 02/22/2023   HGB 8.0 (L) 02/22/2023   HCT 26.2 (L) 02/22/2023   MCV 81.6 02/22/2023   PLT 431 (H) 02/22/2023     Chemistry      Component Value Date/Time   NA 143 02/22/2023 1118   NA 144 02/02/2017 1158   NA 140 07/24/2016 1007   K 4.0 02/22/2023 1118   K 3.7 02/02/2017 1158   K 3.6 07/24/2016 1007   CL 107 02/22/2023 1118   CL 103 02/02/2017 1158   CO2 29 02/22/2023 1118   CO2 29 02/02/2017 1158   CO2 26 07/24/2016 1007   BUN 22 02/22/2023 1118   BUN 20 02/02/2017 1158   BUN 30.3 (H) 07/24/2016 1007   CREATININE 1.05 (H) 02/22/2023 1118   CREATININE 0.9 02/02/2017 1158   CREATININE 1.1 07/24/2016 1007   GLU 129 02/02/2017 0000      Component Value Date/Time   CALCIUM 9.6 02/22/2023 1118   CALCIUM 9.0 02/02/2017 1158   CALCIUM 9.3 07/24/2016 1007   ALKPHOS 88 02/22/2023 1118   ALKPHOS 110 (H) 02/02/2017 1158   ALKPHOS 104 07/24/2016 1007   AST 14 (L) 02/22/2023 1118   AST 13 07/24/2016 1007   ALT 11 02/22/2023 1118   ALT 26 02/02/2017 1158   ALT 15 07/24/2016 1007   BILITOT 0.6 02/22/2023 1118   BILITOT 0.55 07/24/2016 1007     Encounter Diagnoses  Name Primary?   THROMBOCYTHEMIA Yes   Myeloproliferative neoplasm (HCC)    Erythropoietin deficiency anemia    Endometrial carcinoma (HCC)    Iron deficiency anemia due to chronic blood loss     Impression and Plan: Alexandra [name] is 72 y.o. [place] female. She has essential thrombocythemia. She actually is Calreticulin positive. She would also likely benefit from Procrit though she declines at this time.   Today her platelet level is 431 which is the highest it has been  in quiet some time though she is on 4 mg of her medication now instead of 5. We will watch this an dif her values get above 600 I would suggest she return to 5 mg daily of her Anagrelide. Iron levels pending but suspected to be normal given her recent trends.   She will continue her  follow up with Dr. Pricilla Holm of Gynecologic Oncology.  Disposition RTC 2 months MD, labs (CBC w/, CMP, iron, ferritin, LDH, save smear)   Rushie Chestnut, PA-C [id number] AM

## 2023-02-26 ENCOUNTER — Inpatient Hospital Stay (HOSPITAL_BASED_OUTPATIENT_CLINIC_OR_DEPARTMENT_OTHER): Payer: Medicare PPO | Admitting: Gynecologic Oncology

## 2023-02-26 ENCOUNTER — Encounter: Payer: Self-pay | Admitting: Gynecologic Oncology

## 2023-02-26 VITALS — BP 134/72 | HR 104 | Temp 97.4°F | Resp 18 | Ht 71.0 in | Wt 133.4 lb

## 2023-02-26 DIAGNOSIS — Z8542 Personal history of malignant neoplasm of other parts of uterus: Secondary | ICD-10-CM

## 2023-02-26 DIAGNOSIS — D473 Essential (hemorrhagic) thrombocythemia: Secondary | ICD-10-CM | POA: Diagnosis not present

## 2023-02-26 DIAGNOSIS — C549 Malignant neoplasm of corpus uteri, unspecified: Secondary | ICD-10-CM

## 2023-02-26 DIAGNOSIS — Z90722 Acquired absence of ovaries, bilateral: Secondary | ICD-10-CM | POA: Diagnosis not present

## 2023-02-26 DIAGNOSIS — Z79899 Other long term (current) drug therapy: Secondary | ICD-10-CM | POA: Diagnosis not present

## 2023-02-26 DIAGNOSIS — Z9079 Acquired absence of other genital organ(s): Secondary | ICD-10-CM | POA: Diagnosis not present

## 2023-02-26 DIAGNOSIS — Z90710 Acquired absence of both cervix and uterus: Secondary | ICD-10-CM | POA: Diagnosis not present

## 2023-02-26 NOTE — Progress Notes (Signed)
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  Endocervical lesion thought to represent a polyp measures 7 mm.  Endometrium is asymmetrically thickened measuring up to 22 mm, noted to be cystic and vascular concerning for possible polyps.  Moderate free fluid noted.  Neither ovary well appreciated.    10/10/2021 Initial Biopsy   EMB: benign endometrial polyp, scant benign endocervix.    11/18/2021 Surgery   Endocervical polypectomy, endometrial polypectomy, and D&C. Findings at the time of surgery included an enlarged anteverted uterus, no adnexal masses. Cervical polyp noted. On hysteroscopy, multiple endometrial polyps with some thickening of the uterine wall noted. Final pathology revealed benign cervical polyp with metaplastic changes. Endometrial polyp revealed high-grade serous carcinoma as did the endometrial curettage specimen.    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 Results of the tumor marker test revealed normal level, 17.1.   12/05/2021 Imaging   CT C/A/P: 1. Heterogeneity of the uterus and cervix with endometrial fluid, compatible with the provided history of endometrial cancer. No evidence of metastatic disease. 2. Small pericardial effusion. 3. Liver may be mildly steatotic. 4. Retroareolar left breast nodule measures 1.5 cm. Patient underwent diagnostic left mammogram and ultrasound on 11/21/2021. 5. Overall dense appearance of the bones, compatible with a known history of myeloproliferative neoplasm. 6.  Aortic atherosclerosis (ICD10-I70.0).   12/24/2021 Surgery    Robotic-assisted lysis of adhesions, total hysterectomy with bilateral salpingoophorectomy, SLN biopsy bilaterally, resection of peritoneal lesions, omentectomy    Findings:  On EUA, 10-12 cm moderately mobile uterus. 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 Normal appearing liver otherwise, stomach, and diaphragm. Normal appearing omentum. Some adhesions of the cecum and ascending colon to the right abdominal sidewall and of the appendix to the right IP ligament and medial aspect of the broad ligament. Otherwise normal appearing small and large bowel. Small volume ascites in the pelvis. Normal appearing adnexa. No obvious adenopathy. Mapping successful to right presacral and left external iliac SLN. Some adhesions between the bladder peritoneum and the anterior uterus. Two small (<1-2 cm) intramural anterior LUS fibroids. Uterus somewhat enlarged and bulbous at the fundus. Numerous bulbous/cystic appearing lesions along the peritoneum - posterior cul de sac, uterosacral ligaments, anterior cul de sac. Findings most c/w endosalpingosis versus endometriosis. Frozen section of some of these lesions sent and not c/w metastatic clear cell carcinoma, defer further characterization to final pathology.     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  Denies any abdominal or pelvic pain.  Denies vaginal bleeding.  Reports baseline bowel bladder function.  Endorses good energy as well as appetite.  Past Medical/Surgical History: Past Medical History:  Diagnosis Date   Blood dyscrasia    Diabetes mellitus without complication (HCC)    Goals of care, counseling/discussion 12/15/2019   Heart murmur    History of  kidney stones  5-6 yrs ago. 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  10/30/2021   DILATATION & CURETTAGE/HYSTEROSCOPY WITH MYOSURE N/A 11/18/2021   Procedure: DILATATION & CURETTAGE/HYSTEROSCOPY WITH MYOSURE;  Surgeon: Romualdo Bolk, MD;  Location: [facility];  Service: Gynecology;  Laterality: N/A;   ECTOPIC PREGNANCY SURGERY  03/02/1982   salpingostomy   ROBOTIC ASSISTED LAPAROSCOPIC LYSIS OF ADHESION N/A 12/24/2021   Procedure: XI ROBOTIC ASSISTED LAPAROSCOPIC LYSIS OF ADHESION;  Surgeon: Carver Fila, MD;  Location: [facility];  Service: Gynecology;  Laterality: N/A;   SENTINEL NODE BIOPSY Bilateral 12/24/2021   Procedure: SENTINEL NODE BIOPSY;  Surgeon: Carver Fila, MD;  Location: [facility];  Service: Gynecology;  Laterality: Bilateral;    Family History  Problem Relation Age of Onset   Heart disease Mother        died age 59   Kidney disease Mother        family hx   Heart attack Father 28       massive   Hypertension Sister    Hypertension Sister    Hypertension Sister    Hypertension Sister    Arthritis Other        family hx   Diabetes Other        family hx   Stroke Other         dialysis   Colon cancer Neg Hx    Breast cancer Neg Hx    Ovarian cancer Neg Hx    Endometrial cancer Neg Hx    Pancreatic cancer Neg Hx    Prostate cancer Neg Hx     Social History   Socioeconomic History   Marital status: Married    Spouse name: Not on file   Number of children: Not on file   Years of education: Not on file   Highest education level: Not on file  Occupational History   Occupation: retired   Occupation: part-time works for her church  Tobacco Use   Smoking status:  Never   Smokeless tobacco: Never   Tobacco comments:    never used tobacco  Vaping Use   Vaping status: Never Used  Substance and Sexual Activity   Alcohol use: No    Alcohol/week: 0.0 standard drinks of alcohol   Drug use: No   Sexual activity: Not Currently    Partners: Male    Birth control/protection: Post-menopausal  Other Topics Concern   Not on file  Social History Narrative   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   No tobacco no ethoh little caffiene .    Social Drivers of Corporate investment banker Strain: Low Risk  (02/01/2023)   Overall Financial Resource Strain (CARDIA)    Difficulty of Paying Living Expenses: Not hard at all  Food Insecurity: No Food Insecurity (02/01/2023)   Hunger Vital Sign    Worried About Running Out of Food in the Last Year: Never true    Ran Out of Food in the Last Year: Never true  Transportation Needs: No Transportation Needs (02/01/2023)   PRAPARE - Transportation  Lack of Transportation (Medical): No    Lack of Transportation (Non-Medical): No  Physical Activity: Insufficiently Active (02/01/2023)   Exercise Vital Sign    Days of Exercise per Week: 3 days    Minutes of Exercise per Session: 30 min  Stress: No Stress Concern Present (02/01/2023)   [employer] of Occupational Health - Occupational Stress Questionnaire    Feeling of Stress : Not at all  Social Connections: Socially Integrated (02/01/2023)   Social Connection and Isolation Panel [NHANES]    Frequency of Communication with Friends and Family: More than three times a week    Frequency of Social Gatherings with Friends and Family: More than three times a week    Attends Religious Services: More than 4 times per year    Active Member of [employer] or Organizations: Yes    Attends Engineer, structural: More than 4 times per year    Marital Status: Married    Current Medications:  Current Outpatient Medications:     amLODipine (NORVASC) 10 MG tablet, TAKE 1 TABLET BY MOUTH EVERY DAY, Disp: 90 tablet, Rfl: 0   anagrelide (AGRYLIN) 1 MG capsule, Take 4 capsules (4 mg total) by mouth daily., Disp: 120 capsule, Rfl: 5   aspirin EC 81 MG tablet, Take 81 mg by mouth every evening., Disp: , Rfl:    Fe Fum-FePoly-Vit C-Vit B3 (INTEGRA) 62.5-62.5-40-3 MG CAPS, TAKE 1 CAPSULE BY MOUTH DAILY, Disp: 30 capsule, Rfl: 8   folic acid (FOLVITE) 1 MG tablet, Take 2 tablets by mouth once daily, Disp: 60 tablet, Rfl: 0   lisinopril (ZESTRIL) 20 MG tablet, TAKE 2 TABLETS BY MOUTH EVERY DAY, Disp: 180 tablet, Rfl: 3   Nebivolol HCl 20 MG TABS, TAKE 1 TABLET(20 MG) BY MOUTH DAILY, Disp: 90 tablet, Rfl: 1   Vitamin D, Ergocalciferol, (DRISDOL) 1.25 MG (50000 UNIT) CAPS capsule, TAKE 1 CAPSULE BY MOUTH 1 TIME A WEEK, Disp: 12 capsule, Rfl: 2  Review of Systems: Denies appetite changes, fevers, chills, fatigue, unexplained weight changes. Denies hearing loss, neck lumps or masses, mouth sores, ringing in ears or voice changes. Denies cough or wheezing.  Denies shortness of breath. Denies chest pain or palpitations. Denies leg swelling. Denies abdominal distention, pain, blood in stools, constipation, diarrhea, nausea, vomiting, or early satiety. Denies pain with intercourse, dysuria, frequency, hematuria or incontinence. Denies hot flashes, pelvic pain, vaginal bleeding or vaginal discharge.   Denies joint pain, back pain or muscle pain/cramps. Denies itching, rash, or wounds. Denies dizziness, headaches, numbness or seizures. Denies swollen lymph nodes or glands, denies easy bruising or bleeding. Denies anxiety, depression, confusion, or decreased concentration.  Physical Exam: BP 134/72 (BP Location: Right Arm, Patient Position: Sitting, Cuff Size: Normal)   Pulse (!) 104   Temp (!) 97.4 F (36.3 C) (Temporal)   Resp 18   Ht 5\' 11"  (1.803 m)   Wt 133 lb 6.4 oz (60.5 kg)   SpO2 100%   BMI 18.61 kg/m  General: Alert,  oriented, no acute distress. HEENT: Normocephalic, atraumatic, sclera anicteric. Chest: Clear to auscultation bilaterally.  No wheezes or rhonchi. Cardiovascular: Regular rate and rhythm, no murmurs. Abdomen: soft, nontender.  Normoactive bowel sounds.  No masses or hepatosplenomegaly appreciated.  Well-healed incisions. Extremities: Grossly normal range of motion.  Warm, well perfused.  No edema bilaterally. Skin: No rashes or lesions noted. Lymphatics: No cervical, supraclavicular, or inguinal adenopathy. GU: Normal appearing external genitalia without erythema, excoriation, or lesions.  Speculum exam reveals mildly atrophic vaginal  mucosa.  Cuff is intact, mild discoloration along the entire cuff line (unchanged in apeparance), no masses or atypical vascularity.  Bimanual exam reveals cuff intact, no nodularity.  Rectovaginal exam confirms findings, no masses palpated.  Laboratory & Radiologic Studies:         Component Ref Range & Units (hover) 2 mo ago 6 mo ago 7 mo ago 9 mo ago 10 mo ago 1 yr ago  Cancer Antigen (CA) 125 6.6 6.6 CM 6.3 CM 7.1 CM 7.2 CM 17.1 C     Assessment & Plan: Alexandra [name] is a 72 y.o. 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 Surgery 11/2021. Tumor confined to endometrium, negative washings. After discussion, patient opted against adjuvant therapy. MMR intact, MS-stable, Her 2 +.   The patient is doing very well, is NED on exam today.     Patient again prefers to get CA125 when she sees her medical oncologist next. This was ordered for future lab draw.   Per NCCN surveillance recommendations, in the setting of high risk disease, we will continue with visits every 3 months for the first 2-3 years before transitioning to 33-month surveillance visits.  We again reviewed signs and symptoms that would be concerning for cancer recurrence and I stressed the importance of calling if she develops any of these  before her next visit.  20 minutes of total time was spent for this patient encounter, including preparation, face-to-face counseling with the patient and coordination of care, and documentation of the encounter.  Eugene Garnet, MD  Division of Gynecologic Oncology  Department of Obstetrics and Gynecology  [facility]

## 2023-02-26 NOTE — Patient Instructions (Signed)
It was good to see you today.  I do not see or feel any evidence of cancer recurrence on your exam.  I will see you for follow-up in 3 months.  As always, if you develop any new and concerning symptoms before your next visit, please call to see me sooner.  

## 2023-03-04 ENCOUNTER — Other Ambulatory Visit: Payer: Self-pay | Admitting: Family

## 2023-03-11 ENCOUNTER — Other Ambulatory Visit: Payer: Self-pay | Admitting: Internal Medicine

## 2023-03-11 ENCOUNTER — Other Ambulatory Visit: Payer: Self-pay | Admitting: Hematology & Oncology

## 2023-03-11 DIAGNOSIS — D508 Other iron deficiency anemias: Secondary | ICD-10-CM

## 2023-03-11 DIAGNOSIS — D473 Essential (hemorrhagic) thrombocythemia: Secondary | ICD-10-CM

## 2023-03-12 ENCOUNTER — Other Ambulatory Visit: Payer: Self-pay | Admitting: Internal Medicine

## 2023-03-12 MED ORDER — LISINOPRIL 20 MG PO TABS: ORAL_TABLET | ORAL | 0 refills | Status: DC

## 2023-03-12 MED ORDER — AMLODIPINE BESYLATE 10 MG PO TABS: ORAL_TABLET | ORAL | 0 refills | Status: DC

## 2023-03-12 NOTE — Telephone Encounter (Signed)
 Copied from CRM [id number]. Topic: Clinical - Medication Refill >> Mar 12, 2023  8:34 AM Laymon HERO wrote: Most Recent Primary Care Visit:  Provider: TANDA ROJELIO ORN  Department: [facility]  Visit Type: MEDICARE AWV, SEQUENTIAL  Date: 02/01/2023  Medication: Nebivolol  HCl 20 MG TABS   Has the patient contacted their pharmacy? Yes (Agent: If no, request that the patient contact the pharmacy for the refill. If patient does not wish to contact the pharmacy document the reason why and proceed with request.) (Agent: If yes, when and what did the pharmacy advise?)  Is this the correct pharmacy for this prescription? Yes If no, delete pharmacy and type the correct one.  This is the patient's preferred pharmacy:  [id number] [place], [place] - [phone] AT [facility] & [address] [place] [address] Phone: [phone] Fax: [phone]   Has the prescription been filled recently? Yes  Is the patient out of the medication? Yes  Has the patient been seen for an appointment in the last year OR does the patient have an upcoming appointment? Yes  Can we respond through MyChart? Yes  Agent: Please be advised that Rx refills may take up to 3 business days. We ask that you follow-up with your pharmacy.

## 2023-03-12 NOTE — Telephone Encounter (Signed)
 Lov:08/25/2021  Spoke to pt and inform pt she is due for her cpe. And need a visit for med refill.   Schedule an appt for 05/26/2023.  Pt reports she also needs a refill for lisinopril  and amlodipine . Has few weeks left supply but nebivolol  with only 6 days left.   After checking rx, inform Rx for nebivolol  was sent by our covering provider. Inform pt amlodipine  and lisinopril  will send.

## 2023-03-24 ENCOUNTER — Other Ambulatory Visit: Payer: Self-pay | Admitting: *Deleted

## 2023-03-24 DIAGNOSIS — E559 Vitamin D deficiency, unspecified: Secondary | ICD-10-CM

## 2023-03-24 MED ORDER — ANAGRELIDE HCL 1 MG PO CAPS: 4.0000 mg | ORAL_CAPSULE | Freq: Every day | ORAL | 3 refills | Status: DC

## 2023-03-24 MED ORDER — VITAMIN D (ERGOCALCIFEROL) 1.25 MG (50000 UNIT) PO CAPS: ORAL_CAPSULE | ORAL | 2 refills | Status: DC

## 2023-04-08 ENCOUNTER — Other Ambulatory Visit: Payer: Self-pay | Admitting: Internal Medicine

## 2023-04-09 ENCOUNTER — Other Ambulatory Visit: Payer: Self-pay | Admitting: Internal Medicine

## 2023-04-09 ENCOUNTER — Other Ambulatory Visit: Payer: Self-pay | Admitting: Hematology & Oncology

## 2023-04-09 DIAGNOSIS — D508 Other iron deficiency anemias: Secondary | ICD-10-CM

## 2023-04-09 DIAGNOSIS — D473 Essential (hemorrhagic) thrombocythemia: Secondary | ICD-10-CM

## 2023-04-26 ENCOUNTER — Inpatient Hospital Stay (HOSPITAL_BASED_OUTPATIENT_CLINIC_OR_DEPARTMENT_OTHER): Payer: Medicare PPO | Admitting: Hematology & Oncology

## 2023-04-26 ENCOUNTER — Inpatient Hospital Stay: Payer: Medicare PPO | Attending: Hematology & Oncology

## 2023-04-26 ENCOUNTER — Inpatient Hospital Stay: Payer: Medicare PPO

## 2023-04-26 ENCOUNTER — Ambulatory Visit: Payer: Medicare PPO | Admitting: Hematology & Oncology

## 2023-04-26 ENCOUNTER — Encounter: Payer: Self-pay | Admitting: Hematology & Oncology

## 2023-04-26 DIAGNOSIS — Z90710 Acquired absence of both cervix and uterus: Secondary | ICD-10-CM | POA: Insufficient documentation

## 2023-04-26 DIAGNOSIS — D508 Other iron deficiency anemias: Secondary | ICD-10-CM

## 2023-04-26 DIAGNOSIS — Z8542 Personal history of malignant neoplasm of other parts of uterus: Secondary | ICD-10-CM | POA: Diagnosis not present

## 2023-04-26 DIAGNOSIS — Z90722 Acquired absence of ovaries, bilateral: Secondary | ICD-10-CM | POA: Insufficient documentation

## 2023-04-26 DIAGNOSIS — D471 Chronic myeloproliferative disease: Secondary | ICD-10-CM

## 2023-04-26 DIAGNOSIS — N189 Chronic kidney disease, unspecified: Secondary | ICD-10-CM | POA: Diagnosis not present

## 2023-04-26 DIAGNOSIS — D473 Essential (hemorrhagic) thrombocythemia: Secondary | ICD-10-CM

## 2023-04-26 DIAGNOSIS — Z9079 Acquired absence of other genital organ(s): Secondary | ICD-10-CM | POA: Diagnosis not present

## 2023-04-26 DIAGNOSIS — Z79899 Other long term (current) drug therapy: Secondary | ICD-10-CM | POA: Diagnosis not present

## 2023-04-26 DIAGNOSIS — D500 Iron deficiency anemia secondary to blood loss (chronic): Secondary | ICD-10-CM

## 2023-04-26 DIAGNOSIS — Z7952 Long term (current) use of systemic steroids: Secondary | ICD-10-CM | POA: Diagnosis not present

## 2023-04-26 DIAGNOSIS — D631 Anemia in chronic kidney disease: Secondary | ICD-10-CM | POA: Diagnosis not present

## 2023-04-26 DIAGNOSIS — C549 Malignant neoplasm of corpus uteri, unspecified: Secondary | ICD-10-CM

## 2023-04-26 LAB — CMP (CANCER CENTER ONLY)
ALT: 11 U/L (ref 0–44)
AST: 14 U/L — ABNORMAL LOW (ref 15–41)
Albumin: 4.3 g/dL (ref 3.5–5.0)
Alkaline Phosphatase: 86 U/L (ref 38–126)
Anion gap: 9 (ref 5–15)
BUN: 26 mg/dL — ABNORMAL HIGH (ref 8–23)
CO2: 28 mmol/L (ref 22–32)
Calcium: 10.1 mg/dL (ref 8.9–10.3)
Chloride: 107 mmol/L (ref 98–111)
Creatinine: 1.07 mg/dL — ABNORMAL HIGH (ref 0.44–1.00)
GFR, Estimated: 55 mL/min — ABNORMAL LOW (ref >60)
Glucose, Bld: 112 mg/dL — ABNORMAL HIGH (ref 70–99)
Potassium: 3.7 mmol/L (ref 3.5–5.1)
Sodium: 144 mmol/L (ref 135–145)
Total Bilirubin: 0.5 mg/dL (ref 0.0–1.2)
Total Protein: 7.1 g/dL (ref 6.5–8.1)

## 2023-04-26 LAB — CBC WITH DIFFERENTIAL (CANCER CENTER ONLY)
Abs Immature Granulocytes: 0.07 10*3/uL (ref 0.00–0.07)
Basophils Absolute: 0 10*3/uL (ref 0.0–0.1)
Basophils Relative: 1 %
Eosinophils Absolute: 0.1 10*3/uL (ref 0.0–0.5)
Eosinophils Relative: 1 %
HCT: 24.9 % — ABNORMAL LOW (ref 36.0–46.0)
Hemoglobin: 7.8 g/dL — ABNORMAL LOW (ref 12.0–15.0)
Immature Granulocytes: 1 %
Lymphocytes Relative: 22 %
Lymphs Abs: 1.5 10*3/uL (ref 0.7–4.0)
MCH: 25.3 pg — ABNORMAL LOW (ref 26.0–34.0)
MCHC: 31.3 g/dL (ref 30.0–36.0)
MCV: 80.8 fL (ref 80.0–100.0)
Monocytes Absolute: 0.7 10*3/uL (ref 0.1–1.0)
Monocytes Relative: 10 %
Neutro Abs: 4.6 10*3/uL (ref 1.7–7.7)
Neutrophils Relative %: 65 %
Platelet Count: 273 10*3/uL (ref 150–400)
RBC: 3.08 MIL/uL — ABNORMAL LOW (ref 3.87–5.11)
RDW: 19.8 % — ABNORMAL HIGH (ref 11.5–15.5)
WBC Count: 7.1 10*3/uL (ref 4.0–10.5)
nRBC: 0 % (ref 0.0–0.2)

## 2023-04-26 LAB — IRON AND IRON BINDING CAPACITY (CC-WL,HP ONLY)
Iron: 76 ug/dL (ref 28–170)
Saturation Ratios: 28 % (ref 10.4–31.8)
TIBC: 267 ug/dL (ref 250–450)
UIBC: 191 ug/dL (ref 148–442)

## 2023-04-26 LAB — LACTATE DEHYDROGENASE: LDH: 217 U/L — ABNORMAL HIGH (ref 98–192)

## 2023-04-26 LAB — FERRITIN: Ferritin: 283 ng/mL (ref 11–307)

## 2023-04-26 LAB — SAVE SMEAR(SSMR), FOR PROVIDER SLIDE REVIEW

## 2023-04-26 MED ORDER — FOLIC ACID 1 MG PO TABS: 2.0000 mg | ORAL_TABLET | Freq: Every day | ORAL | 3 refills | Status: DC

## 2023-04-26 MED ORDER — ANAGRELIDE HCL 1 MG PO CAPS: 4.0000 mg | ORAL_CAPSULE | Freq: Every day | ORAL | 1 refills | Status: DC

## 2023-04-26 NOTE — Progress Notes (Signed)
 BP remains elevated, 143/77, instructed to monitor at home and notify PCP if it remains over 140/90, verbalized understanding.

## 2023-04-26 NOTE — Progress Notes (Signed)
 Hematology and Oncology Follow Up Visit  Alexandra [name] [id number] [birth year] 73 y.o. 04/26/2023   Principle Diagnosis:  Essential thrombocythemia -Calreticulin (+)  High-grade serous carcinoma of the uterus  -- stage 1 ([id number]) Anemia of erythropoietin deficiency   Current Therapy:   Anagrelide 4 mg p.o. daily --changed on 08/14/2022 Folic acid 2 mg p.o. daily  Aspirin 81 mg p.o. daily  Procrit 40,000 units SQ weekly for hemoglobin less than 10-patient declined to date S/p TAH-BSO -- 12/24/2021  Interim History:  [name] is back for followup.  We saw her on 4 visits.  Since then, she quite nicely.  She has had no problems with the anagrelide.  She does have a history of early-stage uterine cancer.  She is being followed by Surgical Oncology for this.  She has had no problems with pain.  She has had no problems with nausea or vomiting.  She has had no cough or shortness of breath.  She is avoided COVID.  There is no change in bowel or bladder habits..  She has had no rashes.  Is been no leg swelling.  Her last iron studies that were done back in December showed a ferritin of 254 with an iron saturation of 33%.  Her appetite has been quite good.  Unfortunately, her husband has had problems with knees and hips.  Hopefully, he will not need surgery.  Currently, I would have said that her performance status is probably ECOG 1.     Wt Readings from Last 3 Encounters:  04/26/23 136 lb (61.7 kg)  02/26/23 133 lb 6.4 oz (60.5 kg)  02/22/23 131 lb 6.4 oz (59.6 kg)   Medications:  Current Outpatient Medications:    amLODipine (NORVASC) 10 MG tablet, TAKE 1 TABLET BY MOUTH EVERY DAY, Disp: 30 tablet, Rfl: 0   aspirin EC 81 MG tablet, Take 81 mg by mouth every evening., Disp: , Rfl:    Fe Fum-FePoly-Vit C-Vit B3 (INTEGRA) 62.5-62.5-40-3 MG CAPS, TAKE 1 CAPSULE BY MOUTH DAILY, Disp: 30 capsule, Rfl: 8   lisinopril (ZESTRIL) 20 MG tablet, TAKE 2 TABLETS BY MOUTH EVERY DAY, Disp: 60  tablet, Rfl: 0   Nebivolol HCl 20 MG TABS, TAKE 1 TABLET(20 MG) BY MOUTH DAILY, Disp: 90 tablet, Rfl: 1   Vitamin D, Ergocalciferol, (DRISDOL) 1.25 MG (50000 UNIT) CAPS capsule, TAKE 1 CAPSULE BY MOUTH 1 TIME A WEEK, Disp: 12 capsule, Rfl: 2   anagrelide (AGRYLIN) 1 MG capsule, Take 4 capsules (4 mg total) by mouth daily., Disp: 360 capsule, Rfl: 1   folic acid (FOLVITE) 1 MG tablet, Take 2 tablets (2 mg total) by mouth daily., Disp: 180 tablet, Rfl: 3  Allergies: No Known Allergies  Past Medical History, Surgical history, Social history, and Family History were reviewed and updated.  Review of Systems: Review of Systems  Constitutional: Negative.   HENT: Negative.    Eyes: Negative.   Respiratory: Negative.    Cardiovascular: Negative.   Gastrointestinal: Negative.   Genitourinary: Negative.   Musculoskeletal: Negative.   Skin: Negative.   Neurological: Negative.   Endo/Heme/Allergies: Negative.   Psychiatric/Behavioral: Negative.    Marland Kitchen  Physical Exam:  height is 5\' 11"  (1.803 m) and weight is 136 lb (61.7 kg). Her oral temperature is 97.9 F (36.6 C). Her blood pressure is 143/77 (abnormal, pended) and her pulse is 88. Her respiration is 20 and oxygen saturation is 100%. =jh  Physical Exam Vitals reviewed.  HENT:     Head: Normocephalic and  atraumatic.  Eyes:     Pupils: Pupils are equal, round, and reactive to light.  Cardiovascular:     Rate and Rhythm: Normal rate and regular rhythm.     Heart sounds: Normal heart sounds.  Pulmonary:     Effort: Pulmonary effort is normal.     Breath sounds: Normal breath sounds.  Abdominal:     General: Bowel sounds are normal.     Palpations: Abdomen is soft.     Comments: Abdominal exam shows well-healed laparoscopic scars.  There is no fluid wave.  There is no guarding or rebound tenderness.  There is no palpable hepatosplenomegaly.  Musculoskeletal:        General: No tenderness or deformity. Normal range of motion.      Cervical back: Normal range of motion.  Lymphadenopathy:     Cervical: No cervical adenopathy.  Skin:    General: Skin is warm and dry.     Findings: No erythema or rash.  Neurological:     Mental Status: She is alert and oriented to person, place, and time.  Psychiatric:        Behavior: Behavior normal.        Thought Content: Thought content normal.        Judgment: Judgment normal.     Lab Results  Component Value Date   WBC 7.1 04/26/2023   HGB 7.8 (L) 04/26/2023   HCT 24.9 (L) 04/26/2023   MCV 80.8 04/26/2023   PLT 273 04/26/2023     Chemistry      Component Value Date/Time   NA 144 04/26/2023 1002   NA 144 02/02/2017 1158   NA 140 07/24/2016 1007   K 3.7 04/26/2023 1002   K 3.7 02/02/2017 1158   K 3.6 07/24/2016 1007   CL 107 04/26/2023 1002   CL 103 02/02/2017 1158   CO2 28 04/26/2023 1002   CO2 29 02/02/2017 1158   CO2 26 07/24/2016 1007   BUN 26 (H) 04/26/2023 1002   BUN 20 02/02/2017 1158   BUN 30.3 (H) 07/24/2016 1007   CREATININE 1.07 (H) 04/26/2023 1002   CREATININE 0.9 02/02/2017 1158   CREATININE 1.1 07/24/2016 1007   GLU 129 02/02/2017 0000      Component Value Date/Time   CALCIUM 10.1 04/26/2023 1002   CALCIUM 9.0 02/02/2017 1158   CALCIUM 9.3 07/24/2016 1007   ALKPHOS 86 04/26/2023 1002   ALKPHOS 110 (H) 02/02/2017 1158   ALKPHOS 104 07/24/2016 1007   AST 14 (L) 04/26/2023 1002   AST 13 07/24/2016 1007   ALT 11 04/26/2023 1002   ALT 26 02/02/2017 1158   ALT 15 07/24/2016 1007   BILITOT 0.5 04/26/2023 1002   BILITOT 0.55 07/24/2016 1007      Impression and Plan:  [name] is 73 y.o. African [place] female. She has essential thrombocythemia. She actually is Calreticulin positive. She would also likely benefit from Procrit though she declines at this time.   Her platelet count is better.  I am happy about this.  I looked at her blood under the microscope.  I do not think they look like there is activity with the  thrombocythemia.  Again, we will go ahead and plan to get her back in a couple months.  I think we get her back after Easter.  I know she would enjoy this.    Josph Macho, MD 2/24/202511:10 AM

## 2023-04-27 LAB — CA 125: Cancer Antigen (CA) 125: 6.5 U/mL (ref 0.0–38.1)

## 2023-05-06 ENCOUNTER — Other Ambulatory Visit: Payer: Self-pay | Admitting: Family

## 2023-05-07 ENCOUNTER — Other Ambulatory Visit: Payer: Self-pay | Admitting: Internal Medicine

## 2023-05-10 ENCOUNTER — Other Ambulatory Visit: Payer: Self-pay | Admitting: Internal Medicine

## 2023-05-26 ENCOUNTER — Ambulatory Visit (INDEPENDENT_AMBULATORY_CARE_PROVIDER_SITE_OTHER): Payer: Medicare PPO | Admitting: Internal Medicine

## 2023-05-26 ENCOUNTER — Encounter: Payer: Self-pay | Admitting: Internal Medicine

## 2023-05-26 VITALS — BP 134/80 | HR 78 | Temp 97.5°F | Ht 70.0 in | Wt 133.0 lb

## 2023-05-26 DIAGNOSIS — E118 Type 2 diabetes mellitus with unspecified complications: Secondary | ICD-10-CM

## 2023-05-26 DIAGNOSIS — Z0000 Encounter for general adult medical examination without abnormal findings: Secondary | ICD-10-CM | POA: Diagnosis not present

## 2023-05-26 DIAGNOSIS — D473 Essential (hemorrhagic) thrombocythemia: Secondary | ICD-10-CM

## 2023-05-26 DIAGNOSIS — I10 Essential (primary) hypertension: Secondary | ICD-10-CM

## 2023-05-26 DIAGNOSIS — Z79899 Other long term (current) drug therapy: Secondary | ICD-10-CM | POA: Diagnosis not present

## 2023-05-26 LAB — POCT GLYCOSYLATED HEMOGLOBIN (HGB A1C): Hemoglobin A1C: 5.9 % — AB (ref 4.0–5.6)

## 2023-05-26 LAB — MICROALBUMIN / CREATININE URINE RATIO
Creatinine,U: 160.7 mg/dL
Microalb Creat Ratio: 310.1 mg/g — ABNORMAL HIGH (ref 0.0–30.0)
Microalb, Ur: 49.9 mg/dL — ABNORMAL HIGH (ref 0.0–1.9)

## 2023-05-26 NOTE — Progress Notes (Signed)
 No chief complaint on file.   HPI: Patient  Alexandra [name]  73 y.o. comes in today for Preventive Health Care visit  and fu   Followed oncololgy hem for  Serous ca of uterus  total hysterectomy .  Declined   other rx at this time. No spread Cose no rx .  Following markers every 6 months Wants to enjoy the life she has now  Iron def anemia and thromobcythemia .sees  heme Dr Trula Slade 6 weeks or so  No excess sugar in diet. Still active  part time work  [employer] flutter for seconds without  assoc sx and asks if important  no cp sob  Health Maintenance  Topic Date Due   Hepatitis C Screening  Never done   Colonoscopy  05/02/2019   FOOT EXAM  08/26/2022   INFLUENZA VACCINE  05/31/2023 (Originally 10/01/2022)   COVID-19 Vaccine (4 - 2024-25 season) 01/30/2024 (Originally 11/01/2022)   DTaP/Tdap/Td (1 - Tdap) 03/01/2024 (Originally 05/07/1969)   Pneumonia Vaccine 82+ Years old (1 of 2 - PCV) 05/30/2024 (Originally 05/07/1956)   Zoster Vaccines- Shingrix (1 of 2) 03/01/2025 (Originally 05/07/1969)   HEMOGLOBIN A1C  11/26/2023   OPHTHALMOLOGY EXAM  12/30/2023   Medicare Annual Wellness (AWV)  02/01/2024   Diabetic kidney evaluation - eGFR measurement  04/25/2024   Diabetic kidney evaluation - Urine ACR  05/25/2024   MAMMOGRAM  11/24/2024   DEXA SCAN  Completed   HPV VACCINES  Aged Out   Health Maintenance Review LIFESTYLE:  Exercise:  walking.    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 Biomedical engineer . .    ROS:  GEN/ HEENT: No fever, significant weight changes sweats headaches vision problems hearing changes, CV/ PULM; No chest pain shortness of breath cough, syncope,edema  change in exercise tolerance. GI /GU: No adominal pain, vomiting, change in bowel habits. No blood in the stool. No significant GU symptoms. SKIN/HEME: ,no acute skin rashes  suspicious lesions or bleeding. No lymphadenopathy, nodules, masses.  NEURO/ PSYCH:  No neurologic signs such as weakness numbness. No depression anxiety. IMM/ Allergy: No unusual infections.  Allergy .   REST of 12 system review negative except as per HPI   Past Medical History:  Diagnosis Date   Blood dyscrasia    Diabetes mellitus without complication (HCC)    Goals of care, counseling/discussion 12/15/2019   Heart murmur    History of kidney stones    5-6 yrs ago. 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  10/30/2021   DILATATION & CURETTAGE/HYSTEROSCOPY WITH MYOSURE N/A 11/18/2021   Procedure: DILATATION & CURETTAGE/HYSTEROSCOPY WITH MYOSURE;  Surgeon: Romualdo Bolk, MD;  Location: [facility];  Service: Gynecology;  Laterality: N/A;   ECTOPIC PREGNANCY SURGERY  03/02/1982   salpingostomy   ROBOTIC ASSISTED LAPAROSCOPIC LYSIS OF ADHESION N/A 12/24/2021   Procedure: XI ROBOTIC ASSISTED LAPAROSCOPIC LYSIS OF ADHESION;  Surgeon: Carver Fila, MD;  Location: [facility];  Service: Gynecology;  Laterality: N/A;   SENTINEL NODE BIOPSY Bilateral 12/24/2021   Procedure: SENTINEL NODE BIOPSY;  Surgeon: Carver Fila, MD;  Location: [facility];  Service: Gynecology;  Laterality: Bilateral;    Family History  Problem Relation Age of Onset   Heart disease Mother        died age 52   Kidney disease Mother        family hx   Heart attack Father 68       massive   Hypertension Sister    Hypertension Sister    Hypertension Sister    Hypertension Sister    Arthritis Other        family hx   Diabetes Other        family hx   Stroke Other         dialysis   Colon cancer Neg Hx    Breast cancer Neg  Hx    Ovarian cancer Neg Hx    Endometrial cancer Neg Hx    Pancreatic cancer Neg Hx    Prostate cancer Neg Hx     Social History   Socioeconomic History   Marital status: Married    Spouse name: Not on file   Number of children: Not on file   Years of education: Not on file   Highest education level: Not on file  Occupational History   Occupation: retired   Occupation: part-time works for her church  Tobacco Use   Smoking status: Never   Smokeless tobacco: Never   Tobacco comments:    never used tobacco  Vaping Use   Vaping status: Never Used  Substance and Sexual Activity   Alcohol use: No    Alcohol/week: 0.0 standard drinks of alcohol   Drug use: No   Sexual activity: Not Currently    Partners: Male    Birth control/protection: Post-menopausal  Other Topics Concern   Not on file  Social History Narrative   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   No tobacco no ethoh little caffiene .    Social Drivers of Corporate investment banker Strain: Low Risk  (02/01/2023)   Overall Financial Resource Strain (CARDIA)    Difficulty of Paying Living Expenses: Not hard at all  Food Insecurity: No Food Insecurity (02/01/2023)   Hunger Vital Sign    Worried About Running Out of Food in the Last Year: Never true    Ran Out of Food in the Last Year: Never true  Transportation Needs: No Transportation Needs (02/01/2023)   PRAPARE - Administrator, Civil Service (Medical): No    Lack of Transportation (Non-Medical): No  Physical Activity: Insufficiently Active (02/01/2023)   Exercise Vital Sign    Days of Exercise per Week: 3 days    Minutes of Exercise per Session: 30 min  Stress: No Stress Concern Present (02/01/2023)   [employer] of Occupational Health - Occupational Stress Questionnaire    Feeling of Stress : Not at all  Social Connections: Socially Integrated (02/01/2023)   Social Connection and Isolation Panel  [NHANES]    Frequency of Communication with Friends and Family: More than three times a week    Frequency of Social Gatherings with Friends and Family: More than three times a week    Attends Religious Services: More than 4 times per year    Active Member of [employer] or Organizations:  Yes    Attends Banker Meetings: More than 4 times per year    Marital Status: Married    Outpatient Medications Prior to Visit  Medication Sig Dispense Refill   amLODipine (NORVASC) 10 MG tablet TAKE 1 TABLET BY MOUTH EVERY DAY 30 tablet 0   anagrelide (AGRYLIN) 1 MG capsule Take 4 capsules (4 mg total) by mouth daily. 360 capsule 1   aspirin EC 81 MG tablet Take 81 mg by mouth every evening.     Fe Fum-FePoly-Vit C-Vit B3 (INTEGRA) 62.5-62.5-40-3 MG CAPS TAKE 1 CAPSULE BY MOUTH DAILY 30 capsule 8   folic acid (FOLVITE) 1 MG tablet Take 2 tablets (2 mg total) by mouth daily. 180 tablet 3   lisinopril (ZESTRIL) 20 MG tablet TAKE 2 TABLETS BY MOUTH EVERY DAY 60 tablet 0   Nebivolol HCl 20 MG TABS TAKE 1 TABLET(20 MG) BY MOUTH DAILY 90 tablet 1   Vitamin D, Ergocalciferol, (DRISDOL) 1.25 MG (50000 UNIT) CAPS capsule TAKE 1 CAPSULE BY MOUTH 1 TIME A WEEK 12 capsule 2   No facility-administered medications prior to visit.     EXAM:  BP 134/80   Pulse 78   Temp (!) 97.5 F (36.4 C) (Oral)   Ht 5\' 10"  (1.778 m)   Wt 133 lb (60.3 kg)   SpO2 99%   BMI 19.08 kg/m   Body mass index is 19.08 kg/m. Wt Readings from Last 3 Encounters:  05/26/23 133 lb (60.3 kg)  04/26/23 136 lb (61.7 kg)  02/26/23 133 lb 6.4 oz (60.5 kg)    Physical Exam: Vital signs reviewed MWU:XLKG is a well-developed well-nourished alert cooperative    who appearsr stated age in no acute distress.  HEENT: normocephalic atraumatic , Eyes: PERRL EOM's full, conjunctiva clear, Nares: paten,t no deformity discharge or tenderness., Ears: no deformity EAC's clear TMs with normal landmarks. Mouth: clear OP, no lesions, edema.   Moist mucous membranes. Dentition in adequate repair. NECK: supple without masses, thyromegaly or bruits. CHEST/PULM:  Clear to auscultation and percussion breath sounds equal no wheeze , rales or rhonchi. No chest wall deformities or tenderness. Breast: normal by inspection . No dimpling, discharge, masses, tenderness or discharge . CV: PMI is nondisplaced, S1 S2 no gallops, murmurs, rubs. Peripheral pulses are full without delay.No JVD .  ABDOMEN: Bowel sounds normal nontender  No guard or rebound, no hepato splenomegal no CVA tenderness.   Extremtities:  No clubbing cyanosis or edema, no acute joint swelling or redness no focal atrophy NEURO:  Oriented x3, cranial nerves 3-12 appear to be intact, no obvious focal weakness,gait within normal limits no abnormal reflexes or asymmetrical SKIN: No acute rashes normal turgor, color, no bruising or petechiae. PSYCH: Oriented, good eye contact, no obvious depression anxiety, cognition and judgment appear normal. LN: no cervical axillaryadenopathy  Lab Results  Component Value Date   WBC 7.1 04/26/2023   HGB 7.8 (L) 04/26/2023   HCT 24.9 (L) 04/26/2023   PLT 273 04/26/2023   GLUCOSE 112 (H) 04/26/2023   CHOL 253 (H) 04/22/2020   TRIG 56 04/22/2020   HDL 64 04/22/2020   LDLDIRECT 127.3 04/19/2012   LDLCALC 178 (H) 04/22/2020   ALT 11 04/26/2023   AST 14 (L) 04/26/2023   NA 144 04/26/2023   K 3.7 04/26/2023   CL 107 04/26/2023   CREATININE 1.07 (H) 04/26/2023   BUN 26 (H) 04/26/2023   CO2 28 04/26/2023   TSH 1.62 05/14/2016   INR 1.1 05/13/2018  HGBA1C 5.9 (A) 05/26/2023   MICROALBUR 49.9 (H) 05/26/2023    BP Readings from Last 3 Encounters:  05/26/23 134/80  04/26/23 (!) (P) 143/77  02/26/23 134/72    Lab plan reviewed with patient  Done by  dr Myna Hidalgo. Reviewed  need update A1c and urin microalbumin  ASSESSMENT AND PLAN:  Discussed the following assessment and plan: A1c and urine microalbumin    ICD-10-CM   1. Visit  for preventive health examination  Z00.00     2. Medication management  Z79.899     3. Essential hypertension  I10     4. Controlled type 2 diabetes mellitus with complication, without long-term current use of insulin (HCC)  E11.8 Microalbumin / creatinine urine ratio    POC HgB A1c    5. THROMBOCYTHEMIA  D47.3     Controlled metabolic  Sx sound like simple premature beat reviewed if  persistent or progressive or assoc Alexandra [name] then plan  fu options  Disc  alarm sx and when to fu assess if appropriate.  Declines colon, pna and shingrix vaccines  at this time  Past echo 2021 nl lv function mild mr maybe  anemia m if at all today .  Fu 6- 12 month s depending  Return in about 1 year (around 05/25/2024) for preventive /cpx and medications.  Patient Care Team: Luke Rigsbee, Neta Mends, MD as PCP - General Myna Hidalgo Rose Phi, MD as Attending Physician (Hematology and Oncology) Carlus Pavlov, MD as Consulting Physician (Internal Medicine) Patient Instructions  Good to see you today .   Continue lifestyle intervention healthy eating and exercise .  If the palpitation" progresses or causes symptoms.   Check urine for protein today as routine  diabetes check . If all ok then yearly check up.   Neta Mends. Zaniya Mcaulay M.D.

## 2023-05-26 NOTE — Patient Instructions (Addendum)
 Good to see you today .   Continue lifestyle intervention healthy eating and exercise .  If the palpitation" progresses or causes symptoms.   Check urine for protein today as routine  diabetes check . If all ok then yearly check up.

## 2023-05-27 ENCOUNTER — Other Ambulatory Visit: Payer: Self-pay | Admitting: Hematology & Oncology

## 2023-05-27 DIAGNOSIS — D508 Other iron deficiency anemias: Secondary | ICD-10-CM

## 2023-05-27 DIAGNOSIS — D473 Essential (hemorrhagic) thrombocythemia: Secondary | ICD-10-CM

## 2023-05-28 ENCOUNTER — Encounter: Payer: Self-pay | Admitting: Family

## 2023-06-04 ENCOUNTER — Inpatient Hospital Stay: Payer: Medicare PPO | Admitting: Gynecologic Oncology

## 2023-06-06 ENCOUNTER — Other Ambulatory Visit: Payer: Self-pay | Admitting: Internal Medicine

## 2023-06-07 ENCOUNTER — Encounter: Payer: Self-pay | Admitting: Internal Medicine

## 2023-06-07 NOTE — Progress Notes (Signed)
 Urine protein is modestly elevated .   Repeat  urine micro albumin cr ratio in 3 months  if still elevated then  plan appt to discuss.

## 2023-06-07 NOTE — Telephone Encounter (Signed)
 Patient is needing a call back regarding her results she stated she needs to know if there is something she can be doing in the process to him with the protein in her urine

## 2023-06-08 NOTE — Telephone Encounter (Signed)
 Just BP  and dm control   and if persists there are medications that can help prevent  progression. such as farxiga or jardiance . This may not be a persistent problem  but will follow .

## 2023-06-11 ENCOUNTER — Other Ambulatory Visit: Payer: Self-pay

## 2023-06-11 ENCOUNTER — Other Ambulatory Visit: Payer: Self-pay | Admitting: Internal Medicine

## 2023-06-11 DIAGNOSIS — E118 Type 2 diabetes mellitus with unspecified complications: Secondary | ICD-10-CM

## 2023-06-11 DIAGNOSIS — Z79899 Other long term (current) drug therapy: Secondary | ICD-10-CM

## 2023-06-11 NOTE — Progress Notes (Signed)
 Microalbumin urine lab order is placed. Pt has upcoming lab appt.

## 2023-06-18 ENCOUNTER — Encounter: Payer: Self-pay | Admitting: Gynecologic Oncology

## 2023-06-18 ENCOUNTER — Inpatient Hospital Stay: Attending: Hematology & Oncology | Admitting: Gynecologic Oncology

## 2023-06-18 VITALS — BP 135/63 | HR 94 | Resp 18 | Ht 71.0 in | Wt 137.0 lb

## 2023-06-18 DIAGNOSIS — Z90722 Acquired absence of ovaries, bilateral: Secondary | ICD-10-CM | POA: Diagnosis not present

## 2023-06-18 DIAGNOSIS — Z9079 Acquired absence of other genital organ(s): Secondary | ICD-10-CM | POA: Insufficient documentation

## 2023-06-18 DIAGNOSIS — Z90710 Acquired absence of both cervix and uterus: Secondary | ICD-10-CM | POA: Diagnosis not present

## 2023-06-18 DIAGNOSIS — D473 Essential (hemorrhagic) thrombocythemia: Secondary | ICD-10-CM | POA: Diagnosis not present

## 2023-06-18 DIAGNOSIS — Z79899 Other long term (current) drug therapy: Secondary | ICD-10-CM | POA: Insufficient documentation

## 2023-06-18 DIAGNOSIS — Z8542 Personal history of malignant neoplasm of other parts of uterus: Secondary | ICD-10-CM

## 2023-06-18 DIAGNOSIS — C549 Malignant neoplasm of corpus uteri, unspecified: Secondary | ICD-10-CM

## 2023-06-18 NOTE — Patient Instructions (Signed)
 It was good to see you today.  I do not see or feel any evidence of cancer recurrence on your exam.  I will see you for follow-up in 3 months.  As always, if you develop any new and concerning symptoms before your next visit, please call to see me sooner.

## 2023-06-18 NOTE — Progress Notes (Signed)
 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  Endocervical lesion thought to represent a polyp measures 7 mm.  Endometrium is asymmetrically thickened measuring up to 22 mm, noted to be cystic and vascular concerning for possible polyps.  Moderate free fluid noted.  Neither ovary well appreciated.    10/10/2021 Initial Biopsy   EMB: benign endometrial polyp, scant benign endocervix.    11/18/2021 Surgery   Endocervical polypectomy, endometrial polypectomy, and D&C. Findings at the time of surgery included an enlarged anteverted uterus, no adnexal masses. Cervical polyp noted. On hysteroscopy, multiple endometrial polyps with some thickening of the uterine wall noted. Final pathology revealed benign cervical polyp with metaplastic changes. Endometrial polyp revealed high-grade serous carcinoma as did the endometrial curettage specimen.    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 Results of the tumor marker test revealed normal level, 17.1.   12/05/2021 Imaging   CT C/A/P: 1. Heterogeneity of the uterus and cervix with endometrial fluid, compatible with the provided history of endometrial cancer. No evidence of metastatic disease. 2. Small pericardial effusion. 3. Liver may be mildly steatotic. 4. Retroareolar left breast nodule measures 1.5 cm. Patient underwent diagnostic left mammogram and ultrasound on 11/21/2021. 5. Overall dense appearance of the bones, compatible with a known history of myeloproliferative neoplasm. 6.  Aortic atherosclerosis (ICD10-I70.0).   12/24/2021 Surgery    Robotic-assisted lysis of adhesions, total hysterectomy with bilateral salpingoophorectomy, SLN biopsy bilaterally, resection of peritoneal lesions, omentectomy    Findings:  On EUA, 10-12 cm moderately mobile uterus. 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 Normal appearing liver otherwise, stomach, and diaphragm. Normal appearing omentum. Some adhesions of the cecum and ascending colon to the right abdominal sidewall and of the appendix to the right IP ligament and medial aspect of the broad ligament. Otherwise normal appearing small and large bowel. Small volume ascites in the pelvis. Normal appearing adnexa. No obvious adenopathy. Mapping successful to right presacral and left external iliac SLN. Some adhesions between the bladder peritoneum and the anterior uterus. Two small (<1-2 cm) intramural anterior LUS fibroids. Uterus somewhat enlarged and bulbous at the fundus. Numerous bulbous/cystic appearing lesions along the peritoneum - posterior cul de sac, uterosacral ligaments, anterior cul de sac. Findings most c/w endosalpingosis versus endometriosis. Frozen section of some of these lesions sent and not c/w metastatic clear cell carcinoma, defer further characterization to final pathology.     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  Denies any vaginal bleeding or pelvic pain.  Reports baseline bowel bladder function.  Past Medical/Surgical History: Past Medical History:  Diagnosis Date   Blood dyscrasia    Diabetes mellitus without complication (HCC)    Goals of care, counseling/discussion 12/15/2019   Heart murmur    History of kidney stones    5-6 yrs ago. 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  10/30/2021   DILATATION & CURETTAGE/HYSTEROSCOPY WITH MYOSURE N/A 11/18/2021   Procedure: DILATATION & CURETTAGE/HYSTEROSCOPY WITH MYOSURE;  Surgeon: Jannis Kate Norris, MD;  Location: [facility];  Service: Gynecology;  Laterality: N/A;   ECTOPIC PREGNANCY SURGERY  03/02/1982   salpingostomy   ROBOTIC ASSISTED LAPAROSCOPIC LYSIS OF ADHESION N/A 12/24/2021   Procedure: XI ROBOTIC ASSISTED LAPAROSCOPIC LYSIS OF ADHESION;  Surgeon: Viktoria Comer SAUNDERS, MD;  Location: [facility];  Service: Gynecology;  Laterality: N/A;   SENTINEL NODE BIOPSY Bilateral 12/24/2021   Procedure: SENTINEL NODE BIOPSY;  Surgeon: Viktoria Comer SAUNDERS, MD;  Location: [facility];  Service: Gynecology;  Laterality: Bilateral;    Family History  Problem Relation Age of Onset   Heart disease Mother        died age 81   Kidney disease Mother        family hx   Heart attack Father 77       massive   Hypertension Sister    Hypertension Sister    Hypertension Sister    Hypertension Sister    Arthritis Other        family hx   Diabetes Other        family hx   Stroke Other         dialysis   Colon cancer Neg Hx    Breast cancer Neg Hx    Ovarian cancer Neg Hx    Endometrial cancer Neg Hx    Pancreatic cancer Neg Hx    Prostate cancer Neg Hx     Social History   Socioeconomic History   Marital status: Married    Spouse name: Not on file   Number of children: Not on file   Years of education: Not on file   Highest education level: Not on file  Occupational History   Occupation: retired   Occupation: part-time works for her church  Tobacco Use   Smoking status: Never   Smokeless tobacco: Never   Tobacco comments:    never  used tobacco  Vaping Use   Vaping status: Never Used  Substance and Sexual Activity   Alcohol use: No    Alcohol/week: 0.0 standard drinks of alcohol   Drug use: No   Sexual activity: Not Currently    Partners: Male    Birth control/protection: Post-menopausal  Other Topics Concern   Not on file  Social History Narrative   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   No tobacco no ethoh little caffiene .    Social Drivers of Corporate Investment Banker Strain: Low Risk  (02/01/2023)   Overall Financial Resource Strain (CARDIA)    Difficulty of Paying Living Expenses: Not hard at all  Food Insecurity: No Food Insecurity (02/01/2023)   Hunger Vital Sign    Worried About Running Out of Food in the Last Year: Never true    Ran Out of Food in the Last Year: Never true  Transportation Needs: No Transportation Needs (02/01/2023)   PRAPARE - Administrator, Civil Service (Medical): No    Lack of Transportation (  Non-Medical): No  Physical Activity: Insufficiently Active (02/01/2023)   Exercise Vital Sign    Days of Exercise per Week: 3 days    Minutes of Exercise per Session: 30 min  Stress: No Stress Concern Present (02/01/2023)   [employer] of Occupational Health - Occupational Stress Questionnaire    Feeling of Stress : Not at all  Social Connections: Socially Integrated (02/01/2023)   Social Connection and Isolation Panel [NHANES]    Frequency of Communication with Friends and Family: More than three times a week    Frequency of Social Gatherings with Friends and Family: More than three times a week    Attends Religious Services: More than 4 times per year    Active Member of [employer] or Organizations: Yes    Attends Engineer, Structural: More than 4 times per year    Marital Status: Married    Current Medications:  Current Outpatient Medications:    amLODipine  (NORVASC ) 10 MG tablet, TAKE 1 TABLET BY MOUTH EVERY DAY,  Disp: 30 tablet, Rfl: 0   anagrelide  (AGRYLIN) 1 MG capsule, Take 4 capsules (4 mg total) by mouth daily., Disp: 360 capsule, Rfl: 1   aspirin EC 81 MG tablet, Take 81 mg by mouth every evening., Disp: , Rfl:    Fe Fum-FePoly-Vit C-Vit B3 (INTEGRA) 62.5-62.5-40-3 MG CAPS, TAKE 1 CAPSULE BY MOUTH DAILY, Disp: 30 capsule, Rfl: 8   folic acid  (FOLVITE ) 1 MG tablet, Take 2 tablets by mouth once daily, Disp: 60 tablet, Rfl: 0   lisinopril  (ZESTRIL ) 20 MG tablet, TAKE 2 TABLETS BY MOUTH EVERY DAY, Disp: 60 tablet, Rfl: 2   Nebivolol  HCl 20 MG TABS, TAKE 1 TABLET(20 MG) BY MOUTH DAILY, Disp: 90 tablet, Rfl: 1   Vitamin D , Ergocalciferol , (DRISDOL ) 1.25 MG (50000 UNIT) CAPS capsule, TAKE 1 CAPSULE BY MOUTH 1 TIME A WEEK, Disp: 12 capsule, Rfl: 2  Review of Systems: Denies appetite changes, fevers, chills, fatigue, unexplained weight changes. Denies hearing loss, neck lumps or masses, mouth sores, ringing in ears or voice changes. Denies cough or wheezing.  Denies shortness of breath. Denies chest pain or palpitations. Denies leg swelling. Denies abdominal distention, pain, blood in stools, constipation, diarrhea, nausea, vomiting, or early satiety. Denies pain with intercourse, dysuria, frequency, hematuria or incontinence. Denies hot flashes, pelvic pain, vaginal bleeding or vaginal discharge.   Denies joint pain, back pain or muscle pain/cramps. Denies itching, rash, or wounds. Denies dizziness, headaches, numbness or seizures. Denies swollen lymph nodes or glands, denies easy bruising or bleeding. Denies anxiety, depression, confusion, or decreased concentration.  Physical Exam: BP 135/63 (BP Location: Left Arm, Patient Position: Sitting)   Pulse 94   Resp 18   Ht 5' 11 (1.803 m)   Wt 137 lb (62.1 kg)   SpO2 99%   BMI 19.11 kg/m  General: Alert, oriented, no acute distress. HEENT: Normocephalic, atraumatic, sclera anicteric. Chest: Clear to auscultation bilaterally.  No wheezes or  rhonchi. Cardiovascular: Regular rate and rhythm, no murmurs. Abdomen: soft, nontender.  Normoactive bowel sounds.  No masses or hepatosplenomegaly appreciated.  Well-healed incisions. Extremities: Grossly normal range of motion.  Warm, well perfused.  No edema bilaterally. Skin: No rashes or lesions noted. Lymphatics: No cervical, supraclavicular, or inguinal adenopathy. GU: Normal appearing external genitalia without erythema, excoriation, or lesions.  Speculum exam reveals mildly atrophic vaginal mucosa.  Cuff is intact, mild discoloration along the entire cuff line (unchanged in apeparance), no masses or atypical vascularity.  Bimanual exam reveals cuff intact,  no nodularity.  Rectovaginal exam confirms findings, no masses palpated.  Laboratory & Radiologic Studies:          Component Ref Range & Units (hover) 1 mo ago (04/26/23) 6 mo ago (12/15/22) 10 mo ago (08/14/22) 11 mo ago (07/03/22) 1 yr ago (05/15/22) 1 yr ago (04/03/22) 1 yr ago (12/03/21)  Cancer Antigen (CA) 125 6.5 6.6 CM 6.6 CM 6.3 CM 7.1 CM 7.2 CM 17.1    Assessment & Plan: ANEISA KARREN is a 73 y.o. 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 Surgery 11/2021. Tumor confined to endometrium, negative washings. After discussion, patient opted against adjuvant therapy. MMR intact, MS-stable, Her 2 +.   The patient is doing very well, is NED on exam today.     CA-125 not repeated today as just performed in 04/2023. Patient again prefers to get CA125 when she sees her medical oncologist next.    Per NCCN surveillance recommendations, in the setting of high risk disease, we will continue with visits every 3 months for the first 2-3 years before transitioning to 52-month surveillance visits.  We again reviewed signs and symptoms that would be concerning for cancer recurrence and I stressed the importance of calling if she develops any of these before her next visit.  20  minutes of total time was spent for this patient encounter, including preparation, face-to-face counseling with the patient and coordination of care, and documentation of the encounter.  Comer Dollar, MD  Division of Gynecologic Oncology  Department of Obstetrics and Gynecology  [facility]

## 2023-06-21 ENCOUNTER — Other Ambulatory Visit: Payer: Self-pay | Admitting: Hematology & Oncology

## 2023-06-21 DIAGNOSIS — D508 Other iron deficiency anemias: Secondary | ICD-10-CM

## 2023-06-21 DIAGNOSIS — D473 Essential (hemorrhagic) thrombocythemia: Secondary | ICD-10-CM

## 2023-06-23 ENCOUNTER — Inpatient Hospital Stay: Payer: Medicare PPO

## 2023-06-23 ENCOUNTER — Inpatient Hospital Stay: Payer: Medicare PPO | Admitting: Hematology & Oncology

## 2023-06-25 ENCOUNTER — Inpatient Hospital Stay

## 2023-06-25 ENCOUNTER — Inpatient Hospital Stay: Admitting: Hematology & Oncology

## 2023-06-25 ENCOUNTER — Encounter: Payer: Self-pay | Admitting: Hematology & Oncology

## 2023-06-25 VITALS — BP 147/80 | HR 92 | Temp 98.0°F | Resp 20 | Ht 71.0 in | Wt 135.8 lb

## 2023-06-25 DIAGNOSIS — Z90710 Acquired absence of both cervix and uterus: Secondary | ICD-10-CM | POA: Diagnosis not present

## 2023-06-25 DIAGNOSIS — Z8542 Personal history of malignant neoplasm of other parts of uterus: Secondary | ICD-10-CM | POA: Diagnosis not present

## 2023-06-25 DIAGNOSIS — Z90722 Acquired absence of ovaries, bilateral: Secondary | ICD-10-CM | POA: Diagnosis not present

## 2023-06-25 DIAGNOSIS — Z9079 Acquired absence of other genital organ(s): Secondary | ICD-10-CM | POA: Diagnosis not present

## 2023-06-25 DIAGNOSIS — D473 Essential (hemorrhagic) thrombocythemia: Secondary | ICD-10-CM | POA: Diagnosis not present

## 2023-06-25 DIAGNOSIS — Z79899 Other long term (current) drug therapy: Secondary | ICD-10-CM | POA: Diagnosis not present

## 2023-06-25 DIAGNOSIS — D508 Other iron deficiency anemias: Secondary | ICD-10-CM

## 2023-06-25 LAB — CBC WITH DIFFERENTIAL (CANCER CENTER ONLY)
Abs Immature Granulocytes: 0.06 10*3/uL (ref 0.00–0.07)
Basophils Absolute: 0 10*3/uL (ref 0.0–0.1)
Basophils Relative: 1 %
Eosinophils Absolute: 0.1 10*3/uL (ref 0.0–0.5)
Eosinophils Relative: 2 %
HCT: 24.4 % — ABNORMAL LOW (ref 36.0–46.0)
Hemoglobin: 7.6 g/dL — ABNORMAL LOW (ref 12.0–15.0)
Immature Granulocytes: 1 %
Lymphocytes Relative: 24 %
Lymphs Abs: 1.3 10*3/uL (ref 0.7–4.0)
MCH: 25 pg — ABNORMAL LOW (ref 26.0–34.0)
MCHC: 31.1 g/dL (ref 30.0–36.0)
MCV: 80.3 fL (ref 80.0–100.0)
Monocytes Absolute: 0.6 10*3/uL (ref 0.1–1.0)
Monocytes Relative: 11 %
Neutro Abs: 3.4 10*3/uL (ref 1.7–7.7)
Neutrophils Relative %: 61 %
Platelet Count: 353 10*3/uL (ref 150–400)
RBC: 3.04 MIL/uL — ABNORMAL LOW (ref 3.87–5.11)
RDW: 20.3 % — ABNORMAL HIGH (ref 11.5–15.5)
WBC Count: 5.4 10*3/uL (ref 4.0–10.5)
nRBC: 0.6 % — ABNORMAL HIGH (ref 0.0–0.2)

## 2023-06-25 LAB — CMP (CANCER CENTER ONLY)
ALT: 14 U/L (ref 0–44)
AST: 14 U/L — ABNORMAL LOW (ref 15–41)
Albumin: 4.2 g/dL (ref 3.5–5.0)
Alkaline Phosphatase: 95 U/L (ref 38–126)
Anion gap: 7 (ref 5–15)
BUN: 30 mg/dL — ABNORMAL HIGH (ref 8–23)
CO2: 29 mmol/L (ref 22–32)
Calcium: 9.6 mg/dL (ref 8.9–10.3)
Chloride: 106 mmol/L (ref 98–111)
Creatinine: 1.35 mg/dL — ABNORMAL HIGH (ref 0.44–1.00)
GFR, Estimated: 41 mL/min — ABNORMAL LOW (ref >60)
Glucose, Bld: 115 mg/dL — ABNORMAL HIGH (ref 70–99)
Potassium: 4 mmol/L (ref 3.5–5.1)
Sodium: 142 mmol/L (ref 135–145)
Total Bilirubin: 0.5 mg/dL (ref 0.0–1.2)
Total Protein: 6.9 g/dL (ref 6.5–8.1)

## 2023-06-25 LAB — SAVE SMEAR(SSMR), FOR PROVIDER SLIDE REVIEW

## 2023-06-25 LAB — FERRITIN: Ferritin: 350 ng/mL — ABNORMAL HIGH (ref 11–307)

## 2023-06-25 LAB — IRON AND IRON BINDING CAPACITY (CC-WL,HP ONLY)
Iron: 88 ug/dL (ref 28–170)
Saturation Ratios: 33 % — ABNORMAL HIGH (ref 10.4–31.8)
TIBC: 263 ug/dL (ref 250–450)
UIBC: 175 ug/dL (ref 148–442)

## 2023-06-25 LAB — LACTATE DEHYDROGENASE: LDH: 218 U/L — ABNORMAL HIGH (ref 98–192)

## 2023-06-25 NOTE — Progress Notes (Signed)
 Hematology and Oncology Follow Up Visit  Alexandra [name] [id number] 06/09/1950 73 y.o. 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 daily --changed on 08/14/2022 Folic acid  2 mg p.o. daily  Aspirin 81 mg p.o. daily  Procrit  40,000 units SQ weekly for hemoglobin less than 10-patient declined to date S/p TAH-BSO -- 12/24/2021  Interim History:  [name] is back for followup.  Everything seems to be going quite well for her.  As always, she is quite busy.  She did have a nice Easter holiday down in [place] ..  She done well on the anagrelide .  She has had no problems with the anagrelide .  There is been no toxicity from the anagrelide .  She did see Gynecology for the history of endometrial cancer.  Everything seems to be going fairly well with this.  She has a good appetite.  She has had no nausea or vomiting.  She has had no change in bowel or bladder habits.  There has been no bleeding.  She has had no leg swelling.  She has had no rashes.  We did check her iron studies last time she was here.  Her ferritin was 283 with an iron saturation of 28%.  Overall, I would say that her performance status is probably ECOG 1.     Wt Readings from Last 3 Encounters:  06/25/23 135 lb 12.8 oz (61.6 kg)  06/18/23 137 lb (62.1 kg)  05/26/23 133 lb (60.3 kg)   Medications:  Current Outpatient Medications:    amLODipine  (NORVASC ) 10 MG tablet, TAKE 1 TABLET BY MOUTH EVERY DAY, Disp: 30 tablet, Rfl: 0   anagrelide  (AGRYLIN) 1 MG capsule, Take 4 capsules (4 mg total) by mouth daily., Disp: 360 capsule, Rfl: 1   aspirin EC 81 MG tablet, Take 81 mg by mouth every evening., Disp: , Rfl:    Fe Fum-FePoly-Vit C-Vit B3 (INTEGRA) 62.5-62.5-40-3 MG CAPS, TAKE 1 CAPSULE BY MOUTH DAILY, Disp: 30 capsule, Rfl: 8   folic acid   (FOLVITE ) 1 MG tablet, Take 2 tablets by mouth once daily, Disp: 60 tablet, Rfl: 0   lisinopril  (ZESTRIL ) 20 MG tablet, TAKE 2 TABLETS BY MOUTH EVERY DAY, Disp: 60 tablet, Rfl: 2   Nebivolol  HCl 20 MG TABS, TAKE 1 TABLET(20 MG) BY MOUTH DAILY, Disp: 90 tablet, Rfl: 1   Vitamin D , Ergocalciferol , (DRISDOL ) 1.25 MG (50000 UNIT) CAPS capsule, TAKE 1 CAPSULE BY MOUTH 1 TIME A WEEK, Disp: 12 capsule, Rfl: 2  Allergies: No Known Allergies  Past Medical History, Surgical history, Social history, and Family History were reviewed and updated.  Review of Systems: Review of Systems  Constitutional: Negative.   HENT: Negative.    Eyes: Negative.   Respiratory: Negative.    Cardiovascular: Negative.   Gastrointestinal: Negative.   Genitourinary: Negative.   Musculoskeletal: Negative.   Skin: Negative.   Neurological: Negative.   Endo/Heme/Allergies: Negative.   Psychiatric/Behavioral: Negative.    Aaron Aas  Physical Exam:  height is 5\' 11"  (1.803 m) and weight is 135 lb 12.8 oz (61.6 kg). Her oral temperature is 98 F (36.7 C). Her blood pressure is 135/78 (pended) and her pulse is 92. Her respiration is 20 and oxygen saturation is 100%. =jh  Physical Exam Vitals reviewed.  HENT:     Head: Normocephalic and atraumatic.  Eyes:  Pupils: Pupils are equal, round, and reactive to light.  Cardiovascular:     Rate and Rhythm: Normal rate and regular rhythm.     Heart sounds: Normal heart sounds.  Pulmonary:     Effort: Pulmonary effort is normal.     Breath sounds: Normal breath sounds.  Abdominal:     General: Bowel sounds are normal.     Palpations: Abdomen is soft.     Comments: Abdominal exam shows well-healed laparoscopic scars.  There is no fluid wave.  There is no guarding or rebound tenderness.  There is no palpable hepatosplenomegaly.  Musculoskeletal:        General: No tenderness or deformity. Normal range of motion.     Cervical back: Normal range of motion.  Lymphadenopathy:      Cervical: No cervical adenopathy.  Skin:    General: Skin is warm and dry.     Findings: No erythema or rash.  Neurological:     Mental Status: She is alert and oriented to person, place, and time.  Psychiatric:        Behavior: Behavior normal.        Thought Content: Thought content normal.        Judgment: Judgment normal.     Lab Results  Component Value Date   WBC 5.4 06/25/2023   HGB 7.6 (L) 06/25/2023   HCT 24.4 (L) 06/25/2023   MCV 80.3 06/25/2023   PLT 353 06/25/2023     Chemistry      Component Value Date/Time   NA 144 04/26/2023 1002   NA 144 02/02/2017 1158   NA 140 07/24/2016 1007   K 3.7 04/26/2023 1002   K 3.7 02/02/2017 1158   K 3.6 07/24/2016 1007   CL 107 04/26/2023 1002   CL 103 02/02/2017 1158   CO2 28 04/26/2023 1002   CO2 29 02/02/2017 1158   CO2 26 07/24/2016 1007   BUN 26 (H) 04/26/2023 1002   BUN 20 02/02/2017 1158   BUN 30.3 (H) 07/24/2016 1007   CREATININE 1.07 (H) 04/26/2023 1002   CREATININE 0.9 02/02/2017 1158   CREATININE 1.1 07/24/2016 1007   GLU 129 02/02/2017 0000      Component Value Date/Time   CALCIUM  10.1 04/26/2023 1002   CALCIUM  9.0 02/02/2017 1158   CALCIUM  9.3 07/24/2016 1007   ALKPHOS 86 04/26/2023 1002   ALKPHOS 110 (H) 02/02/2017 1158   ALKPHOS 104 07/24/2016 1007   AST 14 (L) 04/26/2023 1002   AST 13 07/24/2016 1007   ALT 11 04/26/2023 1002   ALT 26 02/02/2017 1158   ALT 15 07/24/2016 1007   BILITOT 0.5 04/26/2023 1002   BILITOT 0.55 07/24/2016 1007      Impression and Plan:  Alexandra [name] is 73 y.o. African [place] female. She has essential thrombocythemia. She actually is Calreticulin positive.   The anagrelide  is doing quite nicely for her.  Her platelet count is holding steady.  I would not change the anagrelide  dose for now.  As always, she is quite anemic.  However, she is not symptomatic from this.  We are holding off on giving ESA.  We did have to give her some Procrit  before her surgery to get her  through without having to be transfused.    We will plan to get her back to see us  after Memorial Day.  Aaron Aas    Ivor Mars, MD 4/25/20259:58 AM

## 2023-07-08 ENCOUNTER — Other Ambulatory Visit: Payer: Self-pay | Admitting: Internal Medicine

## 2023-07-16 ENCOUNTER — Other Ambulatory Visit: Payer: Self-pay | Admitting: Hematology & Oncology

## 2023-07-16 DIAGNOSIS — D508 Other iron deficiency anemias: Secondary | ICD-10-CM

## 2023-07-16 DIAGNOSIS — D473 Essential (hemorrhagic) thrombocythemia: Secondary | ICD-10-CM

## 2023-08-13 ENCOUNTER — Encounter: Payer: Self-pay | Admitting: Hematology & Oncology

## 2023-08-13 ENCOUNTER — Inpatient Hospital Stay: Attending: Hematology & Oncology

## 2023-08-13 ENCOUNTER — Inpatient Hospital Stay (HOSPITAL_BASED_OUTPATIENT_CLINIC_OR_DEPARTMENT_OTHER): Admitting: Hematology & Oncology

## 2023-08-13 VITALS — BP 135/68 | HR 91 | Temp 97.6°F | Resp 20 | Ht 71.0 in | Wt 137.2 lb

## 2023-08-13 DIAGNOSIS — Z90710 Acquired absence of both cervix and uterus: Secondary | ICD-10-CM | POA: Diagnosis not present

## 2023-08-13 DIAGNOSIS — Z9079 Acquired absence of other genital organ(s): Secondary | ICD-10-CM | POA: Diagnosis not present

## 2023-08-13 DIAGNOSIS — Z7982 Long term (current) use of aspirin: Secondary | ICD-10-CM | POA: Insufficient documentation

## 2023-08-13 DIAGNOSIS — C549 Malignant neoplasm of corpus uteri, unspecified: Secondary | ICD-10-CM | POA: Diagnosis not present

## 2023-08-13 DIAGNOSIS — D473 Essential (hemorrhagic) thrombocythemia: Secondary | ICD-10-CM | POA: Insufficient documentation

## 2023-08-13 DIAGNOSIS — Z79899 Other long term (current) drug therapy: Secondary | ICD-10-CM | POA: Diagnosis not present

## 2023-08-13 DIAGNOSIS — E119 Type 2 diabetes mellitus without complications: Secondary | ICD-10-CM

## 2023-08-13 DIAGNOSIS — Z90722 Acquired absence of ovaries, bilateral: Secondary | ICD-10-CM | POA: Insufficient documentation

## 2023-08-13 DIAGNOSIS — D631 Anemia in chronic kidney disease: Secondary | ICD-10-CM | POA: Insufficient documentation

## 2023-08-13 DIAGNOSIS — Z794 Long term (current) use of insulin: Secondary | ICD-10-CM

## 2023-08-13 DIAGNOSIS — Z8542 Personal history of malignant neoplasm of other parts of uterus: Secondary | ICD-10-CM | POA: Insufficient documentation

## 2023-08-13 DIAGNOSIS — D508 Other iron deficiency anemias: Secondary | ICD-10-CM | POA: Diagnosis not present

## 2023-08-13 DIAGNOSIS — N189 Chronic kidney disease, unspecified: Secondary | ICD-10-CM | POA: Diagnosis not present

## 2023-08-13 LAB — CBC WITH DIFFERENTIAL (CANCER CENTER ONLY)
Abs Immature Granulocytes: 0.05 10*3/uL (ref 0.00–0.07)
Basophils Absolute: 0.1 10*3/uL (ref 0.0–0.1)
Basophils Relative: 1 %
Eosinophils Absolute: 0.1 10*3/uL (ref 0.0–0.5)
Eosinophils Relative: 2 %
HCT: 24.6 % — ABNORMAL LOW (ref 36.0–46.0)
Hemoglobin: 7.8 g/dL — ABNORMAL LOW (ref 12.0–15.0)
Immature Granulocytes: 1 %
Lymphocytes Relative: 22 %
Lymphs Abs: 1.4 10*3/uL (ref 0.7–4.0)
MCH: 25.4 pg — ABNORMAL LOW (ref 26.0–34.0)
MCHC: 31.7 g/dL (ref 30.0–36.0)
MCV: 80.1 fL (ref 80.0–100.0)
Monocytes Absolute: 0.6 10*3/uL (ref 0.1–1.0)
Monocytes Relative: 10 %
Neutro Abs: 4.2 10*3/uL (ref 1.7–7.7)
Neutrophils Relative %: 64 %
Platelet Count: 329 10*3/uL (ref 150–400)
RBC: 3.07 MIL/uL — ABNORMAL LOW (ref 3.87–5.11)
RDW: 19.9 % — ABNORMAL HIGH (ref 11.5–15.5)
WBC Count: 6.5 10*3/uL (ref 4.0–10.5)
nRBC: 0 % (ref 0.0–0.2)

## 2023-08-13 LAB — CMP (CANCER CENTER ONLY)
ALT: 13 U/L (ref 0–44)
AST: 15 U/L (ref 15–41)
Albumin: 4.4 g/dL (ref 3.5–5.0)
Alkaline Phosphatase: 82 U/L (ref 38–126)
Anion gap: 9 (ref 5–15)
BUN: 34 mg/dL — ABNORMAL HIGH (ref 8–23)
CO2: 28 mmol/L (ref 22–32)
Calcium: 9.8 mg/dL (ref 8.9–10.3)
Chloride: 105 mmol/L (ref 98–111)
Creatinine: 1.48 mg/dL — ABNORMAL HIGH (ref 0.44–1.00)
GFR, Estimated: 37 mL/min — ABNORMAL LOW (ref >60)
Glucose, Bld: 114 mg/dL — ABNORMAL HIGH (ref 70–99)
Potassium: 3.8 mmol/L (ref 3.5–5.1)
Sodium: 142 mmol/L (ref 135–145)
Total Bilirubin: 0.7 mg/dL (ref 0.0–1.2)
Total Protein: 7.3 g/dL (ref 6.5–8.1)

## 2023-08-13 LAB — LACTATE DEHYDROGENASE: LDH: 216 U/L — ABNORMAL HIGH (ref 98–192)

## 2023-08-13 LAB — SAVE SMEAR(SSMR), FOR PROVIDER SLIDE REVIEW

## 2023-08-13 LAB — IRON AND IRON BINDING CAPACITY (CC-WL,HP ONLY)
Iron: 96 ug/dL (ref 28–170)
Saturation Ratios: 37 % — ABNORMAL HIGH (ref 10.4–31.8)
TIBC: 259 ug/dL (ref 250–450)
UIBC: 163 ug/dL (ref 148–442)

## 2023-08-13 LAB — FERRITIN: Ferritin: 397 ng/mL — ABNORMAL HIGH (ref 11–307)

## 2023-08-13 NOTE — Progress Notes (Signed)
 Hematology and Oncology Follow Up Visit  Alexandra [name] [id number] September 24, 1950 73 y.o. 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 daily --changed on 08/14/2022 Folic acid  2 mg p.o. daily  Aspirin 81 mg p.o. daily  Procrit  40,000 units SQ weekly for hemoglobin less than 10-patient declined to date S/p TAH-BSO -- 12/24/2021  Interim History:  [name] is back for followup.  So far, she is doing quite nicely.  As always, she is busy.  This summer, she will be traveling.  She is going down to [place], [place]  for a party.  In September, she be going down to [facility] for a wedding.  She has had no problems with the anagrelide .  She has had no issues with nausea or vomiting.  There is been no bleeding.  She has had no fever.  She has had no cough or shortness of breath.  There has been no change in bowel or bladder habits.  Gynecologic Oncology is following the uterine cancer.  Everything seems to be going quite well for her.  As always, she is quite busy.  She did have a nice Easter holiday down in [place] ..  She done well on the anagrelide .  She has had no problems with the anagrelide .  There is been no toxicity from the anagrelide .  She did see Gynecology for the history of endometrial cancer.  Her last CA125 back in February was 6.5.  She has had no problems with bleeding.  There is been no leg swelling.  She has had no bony pain.  Her iron studies that we did back in April showed a ferritin of 350 with an iron saturation of 33%.  Overall, I would say that her performance status is ECOG 1.    Wt Readings from Last 3 Encounters:  08/13/23 137 lb 3.2 oz (62.2 kg)  06/25/23 135 lb 12.8 oz (61.6 kg)  06/18/23 137 lb (62.1 kg)   Medications:  Current Outpatient Medications:    amLODipine   (NORVASC ) 10 MG tablet, TAKE 1 TABLET BY MOUTH EVERY DAY, Disp: 90 tablet, Rfl: 1   anagrelide  (AGRYLIN) 1 MG capsule, Take 4 capsules (4 mg total) by mouth daily., Disp: 360 capsule, Rfl: 1   aspirin EC 81 MG tablet, Take 81 mg by mouth every evening., Disp: , Rfl:    Fe Fum-FePoly-Vit C-Vit B3 (INTEGRA) 62.5-62.5-40-3 MG CAPS, TAKE 1 CAPSULE BY MOUTH DAILY, Disp: 30 capsule, Rfl: 8   folic acid  (FOLVITE ) 1 MG tablet, Take 2 tablets by mouth once daily, Disp: 60 tablet, Rfl: 0   lisinopril  (ZESTRIL ) 20 MG tablet, TAKE 2 TABLETS BY MOUTH EVERY DAY, Disp: 60 tablet, Rfl: 2   Nebivolol  HCl 20 MG TABS, TAKE 1 TABLET(20 MG) BY MOUTH DAILY, Disp: 90 tablet, Rfl: 1   Vitamin D , Ergocalciferol , (DRISDOL ) 1.25 MG (50000 UNIT) CAPS capsule, TAKE 1 CAPSULE BY MOUTH 1 TIME A WEEK, Disp: 12 capsule, Rfl: 2  Allergies: No Known Allergies  Past Medical History, Surgical history, Social history, and Family History were reviewed and updated.  Review of Systems: Review of Systems  Constitutional: Negative.   HENT: Negative.    Eyes: Negative.   Respiratory: Negative.    Cardiovascular: Negative.   Gastrointestinal: Negative.   Genitourinary: Negative.   Musculoskeletal: Negative.   Skin: Negative.   Neurological: Negative.  Endo/Heme/Allergies: Negative.   Psychiatric/Behavioral: Negative.    Aaron Aas  Physical Exam:  height is 5' 11 (1.803 m) and weight is 137 lb 3.2 oz (62.2 kg). Her oral temperature is 97.6 F (36.4 C). Her blood pressure is 135/68 and her pulse is 91. Her respiration is 20 and oxygen saturation is 99%. =jh  Physical Exam Vitals reviewed.  HENT:     Head: Normocephalic and atraumatic.   Eyes:     Pupils: Pupils are equal, round, and reactive to light.    Cardiovascular:     Rate and Rhythm: Normal rate and regular rhythm.     Heart sounds: Normal heart sounds.  Pulmonary:     Effort: Pulmonary effort is normal.     Breath sounds: Normal breath sounds.  Abdominal:      General: Bowel sounds are normal.     Palpations: Abdomen is soft.     Comments: Abdominal exam shows well-healed laparoscopic scars.  There is no fluid wave.  There is no guarding or rebound tenderness.  There is no palpable hepatosplenomegaly.   Musculoskeletal:        General: No tenderness or deformity. Normal range of motion.     Cervical back: Normal range of motion.  Lymphadenopathy:     Cervical: No cervical adenopathy.   Skin:    General: Skin is warm and dry.     Findings: No erythema or rash.   Neurological:     Mental Status: She is alert and oriented to person, place, and time.   Psychiatric:        Behavior: Behavior normal.        Thought Content: Thought content normal.        Judgment: Judgment normal.      Lab Results  Component Value Date   WBC 6.5 08/13/2023   HGB 7.8 (L) 08/13/2023   HCT 24.6 (L) 08/13/2023   MCV 80.1 08/13/2023   PLT 329 08/13/2023     Chemistry      Component Value Date/Time   NA 142 08/13/2023 0931   NA 144 02/02/2017 1158   NA 140 07/24/2016 1007   K 3.8 08/13/2023 0931   K 3.7 02/02/2017 1158   K 3.6 07/24/2016 1007   CL 105 08/13/2023 0931   CL 103 02/02/2017 1158   CO2 28 08/13/2023 0931   CO2 29 02/02/2017 1158   CO2 26 07/24/2016 1007   BUN 34 (H) 08/13/2023 0931   BUN 20 02/02/2017 1158   BUN 30.3 (H) 07/24/2016 1007   CREATININE 1.48 (H) 08/13/2023 0931   CREATININE 0.9 02/02/2017 1158   CREATININE 1.1 07/24/2016 1007   GLU 129 02/02/2017 0000      Component Value Date/Time   CALCIUM  9.8 08/13/2023 0931   CALCIUM  9.0 02/02/2017 1158   CALCIUM  9.3 07/24/2016 1007   ALKPHOS 82 08/13/2023 0931   ALKPHOS 110 (H) 02/02/2017 1158   ALKPHOS 104 07/24/2016 1007   AST 15 08/13/2023 0931   AST 13 07/24/2016 1007   ALT 13 08/13/2023 0931   ALT 26 02/02/2017 1158   ALT 15 07/24/2016 1007   BILITOT 0.7 08/13/2023 0931   BILITOT 0.55 07/24/2016 1007      Impression and Plan:  Alexandra [name] is 73 y.o. African  [place] female. She has essential thrombocythemia. She actually is Calreticulin positive.   I am very happy that her platelet count is doing so well.  There is no need to make a change in her anagrelide   dose.  As always, she is anemic.  She is not symptomatic with this.  As such, we usually hold off on the ESA.  We will plan to get her back now in a couple months or so.  We will try to get her back right before Labor Day.      Ivor Mars, MD 6/13/202510:36 AM

## 2023-09-01 ENCOUNTER — Other Ambulatory Visit: Payer: Self-pay | Admitting: Hematology & Oncology

## 2023-09-01 DIAGNOSIS — D473 Essential (hemorrhagic) thrombocythemia: Secondary | ICD-10-CM

## 2023-09-01 DIAGNOSIS — D508 Other iron deficiency anemias: Secondary | ICD-10-CM

## 2023-09-02 ENCOUNTER — Encounter: Payer: Self-pay | Admitting: Family

## 2023-09-09 ENCOUNTER — Other Ambulatory Visit

## 2023-09-10 ENCOUNTER — Other Ambulatory Visit (INDEPENDENT_AMBULATORY_CARE_PROVIDER_SITE_OTHER)

## 2023-09-10 DIAGNOSIS — E118 Type 2 diabetes mellitus with unspecified complications: Secondary | ICD-10-CM | POA: Diagnosis not present

## 2023-09-10 DIAGNOSIS — Z79899 Other long term (current) drug therapy: Secondary | ICD-10-CM | POA: Diagnosis not present

## 2023-09-10 LAB — MICROALBUMIN / CREATININE URINE RATIO
Creatinine,U: 140.8 mg/dL
Microalb Creat Ratio: 22.9 mg/g (ref 0.0–30.0)
Microalb, Ur: 3.2 mg/dL — ABNORMAL HIGH (ref 0.0–1.9)

## 2023-09-11 ENCOUNTER — Ambulatory Visit: Payer: Self-pay | Admitting: Internal Medicine

## 2023-09-11 NOTE — Progress Notes (Signed)
 Repeat urine for protein is now normal!   Will follow  routine

## 2023-09-12 ENCOUNTER — Other Ambulatory Visit: Payer: Self-pay | Admitting: Family

## 2023-09-20 ENCOUNTER — Other Ambulatory Visit: Payer: Self-pay | Admitting: Internal Medicine

## 2023-09-28 ENCOUNTER — Other Ambulatory Visit: Payer: Self-pay | Admitting: Internal Medicine

## 2023-09-28 MED ORDER — NEBIVOLOL HCL 20 MG PO TABS: ORAL_TABLET | ORAL | 1 refills | Status: DC

## 2023-09-28 NOTE — Telephone Encounter (Signed)
 Copied from CRM [id number]. Topic: Clinical - Medication Refill >> Sep 28, 2023 11:12 AM Lavanda D wrote: Medication: Nebivolol  HCl 20 MG TABS  Has the patient contacted their pharmacy? Yes, no response (Agent: If no, request that the patient contact the pharmacy for the refill. If patient does not wish to contact the pharmacy document the reason why and proceed with request.) (Agent: If yes, when and what did the pharmacy advise?)  This is the patient's preferred pharmacy:  [id number] [address], [place] - [address] AT [facility] OF [facility] & [address] [place] [address] Phone: [phone] Fax: [phone]  Is this the correct pharmacy for this prescription? Yes If no, delete pharmacy and type the correct one.   Has the prescription been filled recently? No  Is the patient out of the medication? Yes   Has the patient been seen for an appointment in the last year OR does the patient have an upcoming appointment? Yes  Can we respond through MyChart? Yes  Agent: Please be advised that Rx refills may take up to 3 business days. We ask that you follow-up with your pharmacy.

## 2023-10-02 ENCOUNTER — Other Ambulatory Visit: Payer: Self-pay | Admitting: Hematology & Oncology

## 2023-10-02 DIAGNOSIS — D473 Essential (hemorrhagic) thrombocythemia: Secondary | ICD-10-CM

## 2023-10-02 DIAGNOSIS — D508 Other iron deficiency anemias: Secondary | ICD-10-CM

## 2023-10-08 ENCOUNTER — Inpatient Hospital Stay: Attending: Hematology & Oncology | Admitting: Gynecologic Oncology

## 2023-10-08 ENCOUNTER — Encounter: Payer: Self-pay | Admitting: Gynecologic Oncology

## 2023-10-08 VITALS — BP 139/70 | HR 98 | Temp 98.0°F | Resp 19 | Wt 135.0 lb

## 2023-10-08 DIAGNOSIS — Z90710 Acquired absence of both cervix and uterus: Secondary | ICD-10-CM | POA: Insufficient documentation

## 2023-10-08 DIAGNOSIS — D649 Anemia, unspecified: Secondary | ICD-10-CM | POA: Insufficient documentation

## 2023-10-08 DIAGNOSIS — Z8542 Personal history of malignant neoplasm of other parts of uterus: Secondary | ICD-10-CM | POA: Insufficient documentation

## 2023-10-08 DIAGNOSIS — Z90722 Acquired absence of ovaries, bilateral: Secondary | ICD-10-CM | POA: Insufficient documentation

## 2023-10-08 DIAGNOSIS — Z79899 Other long term (current) drug therapy: Secondary | ICD-10-CM | POA: Insufficient documentation

## 2023-10-08 DIAGNOSIS — D473 Essential (hemorrhagic) thrombocythemia: Secondary | ICD-10-CM | POA: Diagnosis not present

## 2023-10-08 DIAGNOSIS — E559 Vitamin D deficiency, unspecified: Secondary | ICD-10-CM

## 2023-10-08 DIAGNOSIS — C549 Malignant neoplasm of corpus uteri, unspecified: Secondary | ICD-10-CM

## 2023-10-08 NOTE — Patient Instructions (Signed)
 It was good to see you today.  I do not see or feel any evidence of cancer recurrence on your exam.  I will see you for follow-up in 3 months.  As always, if you develop any new and concerning symptoms before your next visit, please call to see me sooner.

## 2023-10-08 NOTE — Progress Notes (Signed)
 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  Endocervical lesion thought to represent a polyp measures 7 mm.  Endometrium is asymmetrically thickened measuring up to 22 mm, noted to be cystic and vascular concerning for possible polyps.  Moderate free fluid noted.  Neither ovary well appreciated.    10/10/2021 Initial Biopsy   EMB: benign endometrial polyp, scant benign endocervix.    11/18/2021 Surgery   Endocervical polypectomy, endometrial polypectomy, and D&C. Findings at the time of surgery included an enlarged anteverted uterus, no adnexal masses. Cervical polyp noted. On hysteroscopy, multiple endometrial polyps with some thickening of the uterine wall noted. Final pathology revealed benign cervical polyp with metaplastic changes. Endometrial polyp revealed high-grade serous carcinoma as did the endometrial curettage specimen.    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 Results of the tumor marker test revealed normal level, 17.1.   12/05/2021 Imaging   CT C/A/P: 1. Heterogeneity of the uterus and cervix with endometrial fluid, compatible with the provided history of endometrial cancer. No evidence of metastatic disease. 2. Small pericardial effusion. 3. Liver may be mildly steatotic. 4. Retroareolar left breast nodule measures 1.5 cm. Patient underwent diagnostic left mammogram and ultrasound on 11/21/2021. 5. Overall dense appearance of the bones, compatible with a known history of myeloproliferative neoplasm. 6.  Aortic atherosclerosis (ICD10-I70.0).   12/24/2021 Surgery    Robotic-assisted lysis of adhesions, total hysterectomy with bilateral salpingoophorectomy, SLN biopsy bilaterally, resection of peritoneal lesions, omentectomy    Findings:  On EUA, 10-12 cm moderately mobile uterus. 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 Normal appearing liver otherwise, stomach, and diaphragm. Normal appearing omentum. Some adhesions of the cecum and ascending colon to the right abdominal sidewall and of the appendix to the right IP ligament and medial aspect of the broad ligament. Otherwise normal appearing small and large bowel. Small volume ascites in the pelvis. Normal appearing adnexa. No obvious adenopathy. Mapping successful to right presacral and left external iliac SLN. Some adhesions between the bladder peritoneum and the anterior uterus. Two small (<1-2 cm) intramural anterior LUS fibroids. Uterus somewhat enlarged and bulbous at the fundus. Numerous bulbous/cystic appearing lesions along the peritoneum - posterior cul de sac, uterosacral ligaments, anterior cul de sac. Findings most c/w endosalpingosis versus endometriosis. Frozen section of some of these lesions sent and not c/w metastatic clear cell carcinoma, defer further characterization to final pathology.     12/24/2021 Pathology Results   Serous endometrial cancer Confined to endometrium Negative SLNs, no LVI Washings negative Peritoneal implant - negative for carcinoma Omentum negative MMR intact, MS-stable, HER2 3+     Interval History: Doing very well.  Denies any vaginal bleeding.  Denies abdominal or pelvic pain.  Reports baseline bowel bladder function.    Past Medical/Surgical History: Past Medical History:  Diagnosis Date   Blood dyscrasia    Diabetes mellitus without complication (HCC)    Goals of care, counseling/discussion 12/15/2019   Heart murmur    History of kidney stones    5-6 yrs ago.  10/30/2021  Hypertension    echo    Iron deficiency anemia, unspecified 03/15/2013   Myeloproliferative neoplasm (HCC) 12/15/2019   Thrombocytosis     Past Surgical History:  Procedure Laterality Date   ABDOMINAL HYSTERECTOMY     BONE MARROW BIOPSY     has had 3 of them but doesnt remember when.  10/30/2021   DILATATION & CURETTAGE/HYSTEROSCOPY WITH MYOSURE N/A 11/18/2021   Procedure: DILATATION & CURETTAGE/HYSTEROSCOPY WITH MYOSURE;  Surgeon: Jannis Kate Norris, MD;  Location: [facility];  Service: Gynecology;  Laterality: N/A;   ECTOPIC PREGNANCY SURGERY  03/02/1982   salpingostomy   ROBOTIC ASSISTED LAPAROSCOPIC LYSIS OF ADHESION N/A 12/24/2021   Procedure: XI ROBOTIC ASSISTED LAPAROSCOPIC LYSIS OF ADHESION;  Surgeon: Viktoria Comer SAUNDERS, MD;  Location: [facility];  Service: Gynecology;  Laterality: N/A;   SENTINEL NODE BIOPSY Bilateral 12/24/2021   Procedure: SENTINEL NODE BIOPSY;  Surgeon: Viktoria Comer SAUNDERS, MD;  Location: [facility];  Service: Gynecology;  Laterality: Bilateral;    Family History  Problem Relation Age of Onset   Heart disease Mother        died age 22   Kidney disease Mother        family hx   Heart attack Father 65       massive   Hypertension Sister    Hypertension Sister    Hypertension Sister    Hypertension Sister    Arthritis Other        family hx   Diabetes Other        family hx   Stroke Other         dialysis   Colon cancer Neg Hx    Breast cancer Neg Hx    Ovarian cancer Neg Hx    Endometrial cancer Neg Hx    Pancreatic cancer Neg Hx    Prostate cancer Neg Hx     Social History   Socioeconomic History   Marital status: Married    Spouse name: Not on file   Number of children: Not on file   Years of education: Not on file   Highest education level: Not on file  Occupational History   Occupation: retired   Occupation: part-time works for her church  Tobacco Use   Smoking status: Never   Smokeless tobacco: Never    Tobacco comments:    never used tobacco  Vaping Use   Vaping status: Never Used  Substance and Sexual Activity   Alcohol use: No    Alcohol/week: 0.0 standard drinks of alcohol   Drug use: No   Sexual activity: Not Currently    Partners: Male    Birth control/protection: Post-menopausal  Other Topics Concern   Not on file  Social History Narrative   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   No tobacco no ethoh little caffiene .    Social Drivers of Corporate investment banker Strain: Low Risk  (02/01/2023)   Overall Financial Resource Strain (CARDIA)    Difficulty of Paying Living Expenses: Not hard at all  Food Insecurity: No Food Insecurity (02/01/2023)   Hunger Vital Sign    Worried About Running Out of Food in the Last Year: Never true    Ran Out of Food in the Last Year: Never true  Transportation Needs: No Transportation Needs (02/01/2023)   PRAPARE - Administrator, Civil Service (  Medical): No    Lack of Transportation (Non-Medical): No  Physical Activity: Insufficiently Active (02/01/2023)   Exercise Vital Sign    Days of Exercise per Week: 3 days    Minutes of Exercise per Session: 30 min  Stress: No Stress Concern Present (02/01/2023)   [employer] of Occupational Health - Occupational Stress Questionnaire    Feeling of Stress : Not at all  Social Connections: Socially Integrated (02/01/2023)   Social Connection and Isolation Panel    Frequency of Communication with Friends and Family: More than three times a week    Frequency of Social Gatherings with Friends and Family: More than three times a week    Attends Religious Services: More than 4 times per year    Active Member of [employer] or Organizations: Yes    Attends Engineer, structural: More than 4 times per year    Marital Status: Married    Current Medications:  Current Outpatient Medications:    amLODipine  (NORVASC ) 10 MG tablet, TAKE 1 TABLET  BY MOUTH EVERY DAY, Disp: 90 tablet, Rfl: 1   anagrelide  (AGRYLIN) 1 MG capsule, Take 4 capsules (4 mg total) by mouth daily., Disp: 360 capsule, Rfl: 1   aspirin EC 81 MG tablet, Take 81 mg by mouth every evening., Disp: , Rfl:    Fe Fum-FePoly-Vit C-Vit B3 (INTEGRA) 62.5-62.5-40-3 MG CAPS, TAKE 1 CAPSULE BY MOUTH DAILY, Disp: 30 capsule, Rfl: 8   folic acid  (FOLVITE ) 1 MG tablet, Take 2 tablets by mouth once daily, Disp: 60 tablet, Rfl: 0   lisinopril  (ZESTRIL ) 20 MG tablet, TAKE 2 TABLETS BY MOUTH EVERY DAY, Disp: 60 tablet, Rfl: 2   Nebivolol  HCl 20 MG TABS, TAKE 1 TABLET(20 MG) BY MOUTH DAILY, Disp: 90 tablet, Rfl: 1   Vitamin D , Ergocalciferol , (DRISDOL ) 1.25 MG (50000 UNIT) CAPS capsule, TAKE 1 CAPSULE BY MOUTH 1 TIME A WEEK, Disp: 12 capsule, Rfl: 2  Review of Systems: Denies appetite changes, fevers, chills, fatigue, unexplained weight changes. Denies hearing loss, neck lumps or masses, mouth sores, ringing in ears or voice changes. Denies cough or wheezing.  Denies shortness of breath. Denies chest pain or palpitations. Denies leg swelling. Denies abdominal distention, pain, blood in stools, constipation, diarrhea, nausea, vomiting, or early satiety. Denies pain with intercourse, dysuria, frequency, hematuria or incontinence. Denies hot flashes, pelvic pain, vaginal bleeding or vaginal discharge.   Denies joint pain, back pain or muscle pain/cramps. Denies itching, rash, or wounds. Denies dizziness, headaches, numbness or seizures. Denies swollen lymph nodes or glands, denies easy bruising or bleeding. Denies anxiety, depression, confusion, or decreased concentration.  Physical Exam: BP 139/70 (BP Location: Right Arm, Patient Position: Sitting)   Pulse 98   Temp 98 F (36.7 C) (Oral)   Resp 19   Wt 135 lb (61.2 kg)   SpO2 100%   BMI 18.83 kg/m  General: Alert, oriented, no acute distress. HEENT: Normocephalic, atraumatic, sclera anicteric. Chest: Clear to auscultation  bilaterally.  No wheezes or rhonchi. Cardiovascular: Regular rate and rhythm, no murmurs. Abdomen: soft, nontender.  Normoactive bowel sounds.  No masses or hepatosplenomegaly appreciated.  Well-healed incisions. Extremities: Grossly normal range of motion.  Warm, well perfused.  No edema bilaterally. Skin: No rashes or lesions noted. Lymphatics: No cervical, supraclavicular, or inguinal adenopathy. GU: Normal appearing external genitalia without erythema, excoriation, or lesions.  Speculum exam reveals mildly atrophic vaginal mucosa.  Cuff is intact, mild discoloration along the entire cuff line (unchanged in apeparance), no masses or  atypical vascularity.  Bimanual exam reveals cuff intact, no nodularity.  Rectovaginal exam confirms findings, no masses palpated.  Laboratory & Radiologic Studies: Component Ref Range & Units (hover) 5 mo ago (04/26/23) 9 mo ago (12/15/22) 1 yr ago (08/14/22) 1 yr ago (07/03/22) 1 yr ago (05/15/22) 1 yr ago (04/03/22) 1 yr ago (12/03/21)  Cancer Antigen (CA) 125 6.5 6.6 CM 6.6 CM 6.3 CM 7.1 CM 7.2 CM 17.1     Assessment & Plan: ROY SNUFFER is a 73 y.o. 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 Surgery 11/2021. Tumor confined to endometrium, negative washings. After discussion, patient opted against adjuvant therapy. MMR intact, MS-stable, Her 2 +.   The patient is doing very well, is NED on exam today.     Patient again prefers to get CA125 when she sees her medical oncologist next.    Per NCCN surveillance recommendations, in the setting of high risk disease, we will continue with visits every 3 months for the first 2-3 years before transitioning to 88-month surveillance visits.  We again reviewed signs and symptoms that would be concerning for cancer recurrence and I stressed the importance of calling if she develops any of these before her next visit.  20 minutes of total time was spent for this  patient encounter, including preparation, face-to-face counseling with the patient and coordination of care, and documentation of the encounter.  Comer Dollar, MD  Division of Gynecologic Oncology  Department of Obstetrics and Gynecology  [facility]   [facility]

## 2023-10-15 ENCOUNTER — Inpatient Hospital Stay (HOSPITAL_BASED_OUTPATIENT_CLINIC_OR_DEPARTMENT_OTHER): Admitting: Hematology & Oncology

## 2023-10-15 ENCOUNTER — Encounter: Payer: Self-pay | Admitting: Hematology & Oncology

## 2023-10-15 ENCOUNTER — Inpatient Hospital Stay

## 2023-10-15 ENCOUNTER — Other Ambulatory Visit: Payer: Self-pay | Admitting: *Deleted

## 2023-10-15 VITALS — BP 134/77 | HR 85 | Temp 98.1°F | Resp 18 | Ht 71.0 in | Wt 135.1 lb

## 2023-10-15 DIAGNOSIS — Z79899 Other long term (current) drug therapy: Secondary | ICD-10-CM | POA: Diagnosis not present

## 2023-10-15 DIAGNOSIS — Z8542 Personal history of malignant neoplasm of other parts of uterus: Secondary | ICD-10-CM | POA: Diagnosis not present

## 2023-10-15 DIAGNOSIS — E119 Type 2 diabetes mellitus without complications: Secondary | ICD-10-CM

## 2023-10-15 DIAGNOSIS — D473 Essential (hemorrhagic) thrombocythemia: Secondary | ICD-10-CM

## 2023-10-15 DIAGNOSIS — D508 Other iron deficiency anemias: Secondary | ICD-10-CM

## 2023-10-15 DIAGNOSIS — Z90722 Acquired absence of ovaries, bilateral: Secondary | ICD-10-CM | POA: Diagnosis not present

## 2023-10-15 DIAGNOSIS — D631 Anemia in chronic kidney disease: Secondary | ICD-10-CM

## 2023-10-15 DIAGNOSIS — D649 Anemia, unspecified: Secondary | ICD-10-CM | POA: Diagnosis not present

## 2023-10-15 DIAGNOSIS — C549 Malignant neoplasm of corpus uteri, unspecified: Secondary | ICD-10-CM

## 2023-10-15 DIAGNOSIS — Z90710 Acquired absence of both cervix and uterus: Secondary | ICD-10-CM | POA: Diagnosis not present

## 2023-10-15 LAB — CMP (CANCER CENTER ONLY)
ALT: 14 U/L (ref 0–44)
AST: 19 U/L (ref 15–41)
Albumin: 4.4 g/dL (ref 3.5–5.0)
Alkaline Phosphatase: 98 U/L (ref 38–126)
Anion gap: 12 (ref 5–15)
BUN: 34 mg/dL — ABNORMAL HIGH (ref 8–23)
CO2: 26 mmol/L (ref 22–32)
Calcium: 9.8 mg/dL (ref 8.9–10.3)
Chloride: 104 mmol/L (ref 98–111)
Creatinine: 1.37 mg/dL — ABNORMAL HIGH (ref 0.44–1.00)
GFR, Estimated: 41 mL/min — ABNORMAL LOW (ref >60)
Glucose, Bld: 112 mg/dL — ABNORMAL HIGH (ref 70–99)
Potassium: 3.7 mmol/L (ref 3.5–5.1)
Sodium: 142 mmol/L (ref 135–145)
Total Bilirubin: 0.5 mg/dL (ref 0.0–1.2)
Total Protein: 7.7 g/dL (ref 6.5–8.1)

## 2023-10-15 LAB — CBC WITH DIFFERENTIAL (CANCER CENTER ONLY)
Abs Immature Granulocytes: 0.06 K/uL (ref 0.00–0.07)
Basophils Absolute: 0 K/uL (ref 0.0–0.1)
Basophils Relative: 1 %
Eosinophils Absolute: 0.1 K/uL (ref 0.0–0.5)
Eosinophils Relative: 2 %
HCT: 26 % — ABNORMAL LOW (ref 36.0–46.0)
Hemoglobin: 8 g/dL — ABNORMAL LOW (ref 12.0–15.0)
Immature Granulocytes: 1 %
Lymphocytes Relative: 25 %
Lymphs Abs: 1.6 K/uL (ref 0.7–4.0)
MCH: 24.9 pg — ABNORMAL LOW (ref 26.0–34.0)
MCHC: 30.8 g/dL (ref 30.0–36.0)
MCV: 81 fL (ref 80.0–100.0)
Monocytes Absolute: 0.6 K/uL (ref 0.1–1.0)
Monocytes Relative: 9 %
Neutro Abs: 4 K/uL (ref 1.7–7.7)
Neutrophils Relative %: 62 %
Platelet Count: 358 K/uL (ref 150–400)
RBC: 3.21 MIL/uL — ABNORMAL LOW (ref 3.87–5.11)
RDW: 19.1 % — ABNORMAL HIGH (ref 11.5–15.5)
WBC Count: 6.4 K/uL (ref 4.0–10.5)
nRBC: 0 % (ref 0.0–0.2)

## 2023-10-15 LAB — FERRITIN: Ferritin: 417 ng/mL — ABNORMAL HIGH (ref 11–307)

## 2023-10-15 LAB — IRON AND IRON BINDING CAPACITY (CC-WL,HP ONLY)
Iron: 88 ug/dL (ref 28–170)
Saturation Ratios: 30 % (ref 10.4–31.8)
TIBC: 293 ug/dL (ref 250–450)
UIBC: 205 ug/dL

## 2023-10-15 LAB — LACTATE DEHYDROGENASE: LDH: 241 U/L — ABNORMAL HIGH (ref 98–192)

## 2023-10-15 LAB — SAVE SMEAR(SSMR), FOR PROVIDER SLIDE REVIEW

## 2023-10-15 MED ORDER — FOLIC ACID 1 MG PO TABS: 2.0000 mg | ORAL_TABLET | Freq: Every day | ORAL | 2 refills | Status: AC

## 2023-10-15 NOTE — Progress Notes (Signed)
 Hematology and Oncology Follow Up Visit  Alexandra [name] [id number] 24-Jun-1950 73 y.o. 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 daily --changed on 08/14/2022 Folic acid  2 mg p.o. daily  Aspirin 81 mg p.o. daily  Procrit  40,000 units SQ weekly for hemoglobin less than 10-patient declined to date S/p TAH-BSO -- 12/24/2021  Interim History:  [name] is back for followup.  She is quite well.  She has been quite busy.  Unfortunately, she will not be going down to [facility] for a wedding in September.  She is involved with her church.  She enjoys the events that the church has.  Her health is doing well.  She has had no problems with the anagrelide .  She has had no problems with respect to the endometrial cancer.  Gynecologic Oncology is following this.  She has had no change in bowel or bladder habits.  She has had no leg swelling.  She has had no pain in the hands or feet.  She has had no bleeding.  She is doing well on the anagrelide .  There is no nausea or vomiting.  She has had no headache.  Her last iron studies that were done back in June showed a ferritin of 400 with an iron saturation of 37%.  Overall, I would have to say that her performance status is probably ECOG 0.     Wt Readings from Last 3 Encounters:  10/15/23 135 lb 1.3 oz (61.3 kg)  10/08/23 135 lb (61.2 kg)  08/13/23 137 lb 3.2 oz (62.2 kg)   Medications:  Current Outpatient Medications:    amLODipine  (NORVASC ) 10 MG tablet, TAKE 1 TABLET BY MOUTH EVERY DAY, Disp: 90 tablet, Rfl: 1   anagrelide  (AGRYLIN) 1 MG capsule, Take 4 capsules (4 mg total) by mouth daily., Disp: 360 capsule, Rfl: 1   aspirin EC 81 MG tablet, Take 81 mg by mouth every evening., Disp: , Rfl:    Fe Fum-FePoly-Vit C-Vit B3 (INTEGRA) 62.5-62.5-40-3 MG CAPS, TAKE  1 CAPSULE BY MOUTH DAILY, Disp: 30 capsule, Rfl: 8   lisinopril  (ZESTRIL ) 20 MG tablet, TAKE 2 TABLETS BY MOUTH EVERY DAY, Disp: 60 tablet, Rfl: 2   Nebivolol  HCl 20 MG TABS, TAKE 1 TABLET(20 MG) BY MOUTH DAILY, Disp: 90 tablet, Rfl: 1   Vitamin D , Ergocalciferol , (DRISDOL ) 1.25 MG (50000 UNIT) CAPS capsule, TAKE 1 CAPSULE BY MOUTH 1 TIME A WEEK, Disp: 12 capsule, Rfl: 2   folic acid  (FOLVITE ) 1 MG tablet, Take 2 tablets (2 mg total) by mouth daily., Disp: 180 tablet, Rfl: 2  Allergies: No Known Allergies  Past Medical History, Surgical history, Social history, and Family History were reviewed and updated.  Review of Systems: Review of Systems  Constitutional: Negative.   HENT: Negative.    Eyes: Negative.   Respiratory: Negative.    Cardiovascular: Negative.   Gastrointestinal: Negative.   Genitourinary: Negative.   Musculoskeletal: Negative.   Skin: Negative.   Neurological: Negative.   Endo/Heme/Allergies: Negative.   Psychiatric/Behavioral: Negative.    Alexandra [name]  Physical Exam:  height is 5' 11 (1.803 m) and weight is 135 lb 1.3 oz (61.3 kg). Her oral temperature is 98.1 F (36.7 C). Her blood pressure is 134/77 and her pulse is 85. Her respiration is 18 and oxygen saturation is 100%. =jh  Physical Exam Vitals  reviewed.  HENT:     Head: Normocephalic and atraumatic.  Eyes:     Pupils: Pupils are equal, round, and reactive to light.  Cardiovascular:     Rate and Rhythm: Normal rate and regular rhythm.     Heart sounds: Normal heart sounds.  Pulmonary:     Effort: Pulmonary effort is normal.     Breath sounds: Normal breath sounds.  Abdominal:     General: Bowel sounds are normal.     Palpations: Abdomen is soft.     Comments: Abdominal exam shows well-healed laparoscopic scars.  There is no fluid wave.  There is no guarding or rebound tenderness.  There is no palpable hepatosplenomegaly.  Musculoskeletal:        General: No tenderness or deformity. Normal range of motion.      Cervical back: Normal range of motion.  Lymphadenopathy:     Cervical: No cervical adenopathy.  Skin:    General: Skin is warm and dry.     Findings: No erythema or rash.  Neurological:     Mental Status: She is alert and oriented to person, place, and time.  Psychiatric:        Behavior: Behavior normal.        Thought Content: Thought content normal.        Judgment: Judgment normal.      Lab Results  Component Value Date   WBC 6.4 10/15/2023   HGB 8.0 (L) 10/15/2023   HCT 26.0 (L) 10/15/2023   MCV 81.0 10/15/2023   PLT 358 10/15/2023     Chemistry      Component Value Date/Time   NA 142 08/13/2023 0931   NA 144 02/02/2017 1158   NA 140 07/24/2016 1007   K 3.8 08/13/2023 0931   K 3.7 02/02/2017 1158   K 3.6 07/24/2016 1007   CL 105 08/13/2023 0931   CL 103 02/02/2017 1158   CO2 28 08/13/2023 0931   CO2 29 02/02/2017 1158   CO2 26 07/24/2016 1007   BUN 34 (H) 08/13/2023 0931   BUN 20 02/02/2017 1158   BUN 30.3 (H) 07/24/2016 1007   CREATININE 1.48 (H) 08/13/2023 0931   CREATININE 0.9 02/02/2017 1158   CREATININE 1.1 07/24/2016 1007   GLU 129 02/02/2017 0000      Component Value Date/Time   CALCIUM  9.8 08/13/2023 0931   CALCIUM  9.0 02/02/2017 1158   CALCIUM  9.3 07/24/2016 1007   ALKPHOS 82 08/13/2023 0931   ALKPHOS 110 (H) 02/02/2017 1158   ALKPHOS 104 07/24/2016 1007   AST 15 08/13/2023 0931   AST 13 07/24/2016 1007   ALT 13 08/13/2023 0931   ALT 26 02/02/2017 1158   ALT 15 07/24/2016 1007   BILITOT 0.7 08/13/2023 0931   BILITOT 0.55 07/24/2016 1007      Impression and Plan:  Alexandra [name] is 73 y.o. [place] female. She has essential thrombocythemia. She actually is Calreticulin positive.   Her blood counts look good.  Everything is holding steady.  There is no need to make a change in her anagrelide .  As always, she is anemic.  However, she has adjusted to this.  We have tried her on ESA but she has not agreed to take this.  We will  plan to get her back probably in 2 months now.    Alexandra JONELLE Crease, MD 8/15/202510:25 AM

## 2023-10-16 LAB — CA 125: Cancer Antigen (CA) 125: 8 U/mL (ref 0.0–38.1)

## 2023-10-29 ENCOUNTER — Other Ambulatory Visit: Payer: Self-pay | Admitting: *Deleted

## 2023-10-29 DIAGNOSIS — D473 Essential (hemorrhagic) thrombocythemia: Secondary | ICD-10-CM

## 2023-10-29 DIAGNOSIS — D508 Other iron deficiency anemias: Secondary | ICD-10-CM

## 2023-10-29 MED ORDER — INTEGRA 62.5-62.5-40-3 MG PO CAPS: 1.0000 | ORAL_CAPSULE | Freq: Every day | ORAL | 11 refills | Status: AC

## 2023-11-29 ENCOUNTER — Other Ambulatory Visit: Payer: Self-pay | Admitting: *Deleted

## 2023-11-29 DIAGNOSIS — E559 Vitamin D deficiency, unspecified: Secondary | ICD-10-CM

## 2023-11-29 MED ORDER — VITAMIN D (ERGOCALCIFEROL) 1.25 MG (50000 UNIT) PO CAPS: ORAL_CAPSULE | ORAL | 2 refills | Status: AC

## 2023-12-02 ENCOUNTER — Other Ambulatory Visit: Payer: Self-pay | Admitting: Internal Medicine

## 2023-12-02 DIAGNOSIS — Z0000 Encounter for general adult medical examination without abnormal findings: Secondary | ICD-10-CM

## 2023-12-03 ENCOUNTER — Other Ambulatory Visit: Payer: Self-pay

## 2023-12-03 ENCOUNTER — Inpatient Hospital Stay: Attending: Hematology & Oncology

## 2023-12-03 ENCOUNTER — Inpatient Hospital Stay (HOSPITAL_BASED_OUTPATIENT_CLINIC_OR_DEPARTMENT_OTHER): Admitting: Hematology & Oncology

## 2023-12-03 ENCOUNTER — Encounter: Payer: Self-pay | Admitting: Hematology & Oncology

## 2023-12-03 VITALS — BP 147/78 | HR 88 | Temp 97.6°F | Resp 16 | Ht 71.0 in | Wt 135.0 lb

## 2023-12-03 DIAGNOSIS — D508 Other iron deficiency anemias: Secondary | ICD-10-CM

## 2023-12-03 DIAGNOSIS — Z79899 Other long term (current) drug therapy: Secondary | ICD-10-CM | POA: Diagnosis not present

## 2023-12-03 DIAGNOSIS — D631 Anemia in chronic kidney disease: Secondary | ICD-10-CM

## 2023-12-03 DIAGNOSIS — C549 Malignant neoplasm of corpus uteri, unspecified: Secondary | ICD-10-CM | POA: Diagnosis not present

## 2023-12-03 DIAGNOSIS — D471 Chronic myeloproliferative disease: Secondary | ICD-10-CM | POA: Diagnosis not present

## 2023-12-03 DIAGNOSIS — D473 Essential (hemorrhagic) thrombocythemia: Secondary | ICD-10-CM | POA: Insufficient documentation

## 2023-12-03 DIAGNOSIS — Z7982 Long term (current) use of aspirin: Secondary | ICD-10-CM | POA: Diagnosis not present

## 2023-12-03 LAB — IRON AND IRON BINDING CAPACITY (CC-WL,HP ONLY)
Iron: 82 ug/dL (ref 28–170)
Saturation Ratios: 29 % (ref 10.4–31.8)
TIBC: 287 ug/dL (ref 250–450)
UIBC: 205 ug/dL

## 2023-12-03 LAB — CBC WITH DIFFERENTIAL (CANCER CENTER ONLY)
Abs Immature Granulocytes: 0.04 K/uL (ref 0.00–0.07)
Basophils Absolute: 0 K/uL (ref 0.0–0.1)
Basophils Relative: 1 %
Eosinophils Absolute: 0.1 K/uL (ref 0.0–0.5)
Eosinophils Relative: 2 %
HCT: 25.8 % — ABNORMAL LOW (ref 36.0–46.0)
Hemoglobin: 8.1 g/dL — ABNORMAL LOW (ref 12.0–15.0)
Immature Granulocytes: 1 %
Lymphocytes Relative: 24 %
Lymphs Abs: 1.5 K/uL (ref 0.7–4.0)
MCH: 25.6 pg — ABNORMAL LOW (ref 26.0–34.0)
MCHC: 31.4 g/dL (ref 30.0–36.0)
MCV: 81.4 fL (ref 80.0–100.0)
Monocytes Absolute: 0.5 K/uL (ref 0.1–1.0)
Monocytes Relative: 8 %
Neutro Abs: 4 K/uL (ref 1.7–7.7)
Neutrophils Relative %: 64 %
Platelet Count: 488 K/uL — ABNORMAL HIGH (ref 150–400)
RBC: 3.17 MIL/uL — ABNORMAL LOW (ref 3.87–5.11)
RDW: 19.4 % — ABNORMAL HIGH (ref 11.5–15.5)
WBC Count: 6.2 K/uL (ref 4.0–10.5)
nRBC: 0 % (ref 0.0–0.2)

## 2023-12-03 LAB — SAVE SMEAR(SSMR), FOR PROVIDER SLIDE REVIEW

## 2023-12-03 LAB — CMP (CANCER CENTER ONLY)
ALT: 14 U/L (ref 0–44)
AST: 18 U/L (ref 15–41)
Albumin: 4.5 g/dL (ref 3.5–5.0)
Alkaline Phosphatase: 97 U/L (ref 38–126)
Anion gap: 12 (ref 5–15)
BUN: 31 mg/dL — ABNORMAL HIGH (ref 8–23)
CO2: 26 mmol/L (ref 22–32)
Calcium: 9.8 mg/dL (ref 8.9–10.3)
Chloride: 104 mmol/L (ref 98–111)
Creatinine: 1.36 mg/dL — ABNORMAL HIGH (ref 0.44–1.00)
GFR, Estimated: 41 mL/min — ABNORMAL LOW (ref >60)
Glucose, Bld: 127 mg/dL — ABNORMAL HIGH (ref 70–99)
Potassium: 3.5 mmol/L (ref 3.5–5.1)
Sodium: 142 mmol/L (ref 135–145)
Total Bilirubin: 0.6 mg/dL (ref 0.0–1.2)
Total Protein: 7.6 g/dL (ref 6.5–8.1)

## 2023-12-03 LAB — LACTATE DEHYDROGENASE: LDH: 241 U/L — ABNORMAL HIGH (ref 98–192)

## 2023-12-03 LAB — FERRITIN: Ferritin: 362 ng/mL — ABNORMAL HIGH (ref 11–307)

## 2023-12-03 NOTE — Progress Notes (Signed)
 Hematology and Oncology Follow Up Visit  Alexandra [name] [id number] October 11, 1950 73 y.o. 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 daily --changed on 08/14/2022 Folic acid  2 mg p.o. daily  Aspirin 81 mg p.o. daily  Procrit  40,000 units SQ weekly for hemoglobin less than 10-patient declined to date S/p TAH-BSO -- 12/24/2021  Interim History:  [name] is back for followup.   Everything is going quite well for her.  She really has had no complaints since we last saw her back in August.  She is getting ready for the big Homecoming we can for [employer].  She is going go to the [employer] concert.  She is exercising.  She has been quite active.  She has had no problems with the anagrelide .  She has had no problems with taking folic acid .  There is been no bleeding.  She has had no change in bowel or bladder habits.  She has had no rashes.  There is been no leg swelling.  She has had no pain in the hands or feet.  She has had no headache.  She has had no cough or shortness of breath.  Thankfully, she has avoided COVID.SABRA  Overall, I would have to say that her performance status is probably ECOG 0.       Wt Readings from Last 3 Encounters:  12/03/23 135 lb (61.2 kg)  10/15/23 135 lb 1.3 oz (61.3 kg)  10/08/23 135 lb (61.2 kg)   Medications:  Current Outpatient Medications:    amLODipine  (NORVASC ) 10 MG tablet, TAKE 1 TABLET BY MOUTH EVERY DAY, Disp: 90 tablet, Rfl: 1   anagrelide  (AGRYLIN) 1 MG capsule, Take 4 capsules (4 mg total) by mouth daily., Disp: 360 capsule, Rfl: 1   aspirin EC 81 MG tablet, Take 81 mg by mouth every evening., Disp: , Rfl:    Fe Fum-FePoly-Vit C-Vit B3 (INTEGRA) 62.5-62.5-40-3 MG CAPS, Take 1 capsule by mouth daily., Disp: 30 capsule, Rfl: 11   folic acid  (FOLVITE ) 1 MG tablet,  Take 2 tablets (2 mg total) by mouth daily., Disp: 180 tablet, Rfl: 2   lisinopril  (ZESTRIL ) 20 MG tablet, TAKE 2 TABLETS BY MOUTH EVERY DAY, Disp: 60 tablet, Rfl: 2   Nebivolol  HCl 20 MG TABS, TAKE 1 TABLET(20 MG) BY MOUTH DAILY, Disp: 90 tablet, Rfl: 1   Vitamin D , Ergocalciferol , (DRISDOL ) 1.25 MG (50000 UNIT) CAPS capsule, TAKE 1 CAPSULE BY MOUTH 1 TIME A WEEK, Disp: 12 capsule, Rfl: 2  Allergies: No Known Allergies  Past Medical History, Surgical history, Social history, and Family History were reviewed and updated.  Review of Systems: Review of Systems  Constitutional: Negative.   HENT: Negative.    Eyes: Negative.   Respiratory: Negative.    Cardiovascular: Negative.   Gastrointestinal: Negative.   Genitourinary: Negative.   Musculoskeletal: Negative.   Skin: Negative.   Neurological: Negative.   Endo/Heme/Allergies: Negative.   Psychiatric/Behavioral: Negative.    SABRA  Physical Exam:  height is 5' 11 (1.803 m) and weight is 135 lb (61.2 kg). Her oral temperature is 97.6 F (36.4 C). Her blood pressure is 147/78 (abnormal) and her pulse is 88. Her respiration is 16 and oxygen saturation is 100%. =jh  Physical Exam Vitals reviewed.  HENT:     Head: Normocephalic and atraumatic.  Eyes:  Pupils: Pupils are equal, round, and reactive to light.  Cardiovascular:     Rate and Rhythm: Normal rate and regular rhythm.     Heart sounds: Normal heart sounds.  Pulmonary:     Effort: Pulmonary effort is normal.     Breath sounds: Normal breath sounds.  Abdominal:     General: Bowel sounds are normal.     Palpations: Abdomen is soft.     Comments: Abdominal exam shows well-healed laparoscopic scars.  There is no fluid wave.  There is no guarding or rebound tenderness.  There is no palpable hepatosplenomegaly.  Musculoskeletal:        General: No tenderness or deformity. Normal range of motion.     Cervical back: Normal range of motion.  Lymphadenopathy:     Cervical: No  cervical adenopathy.  Skin:    General: Skin is warm and dry.     Findings: No erythema or rash.  Neurological:     Mental Status: She is alert and oriented to person, place, and time.  Psychiatric:        Behavior: Behavior normal.        Thought Content: Thought content normal.        Judgment: Judgment normal.      Lab Results  Component Value Date   WBC 6.4 10/15/2023   HGB 8.0 (L) 10/15/2023   HCT 26.0 (L) 10/15/2023   MCV 81.0 10/15/2023   PLT 358 10/15/2023     Chemistry      Component Value Date/Time   NA 142 10/15/2023 0941   NA 144 02/02/2017 1158   NA 140 07/24/2016 1007   K 3.7 10/15/2023 0941   K 3.7 02/02/2017 1158   K 3.6 07/24/2016 1007   CL 104 10/15/2023 0941   CL 103 02/02/2017 1158   CO2 26 10/15/2023 0941   CO2 29 02/02/2017 1158   CO2 26 07/24/2016 1007   BUN 34 (H) 10/15/2023 0941   BUN 20 02/02/2017 1158   BUN 30.3 (H) 07/24/2016 1007   CREATININE 1.37 (H) 10/15/2023 0941   CREATININE 0.9 02/02/2017 1158   CREATININE 1.1 07/24/2016 1007   GLU 129 02/02/2017 0000      Component Value Date/Time   CALCIUM  9.8 10/15/2023 0941   CALCIUM  9.0 02/02/2017 1158   CALCIUM  9.3 07/24/2016 1007   ALKPHOS 98 10/15/2023 0941   ALKPHOS 110 (H) 02/02/2017 1158   ALKPHOS 104 07/24/2016 1007   AST 19 10/15/2023 0941   AST 13 07/24/2016 1007   ALT 14 10/15/2023 0941   ALT 26 02/02/2017 1158   ALT 15 07/24/2016 1007   BILITOT 0.5 10/15/2023 0941   BILITOT 0.55 07/24/2016 1007      Impression and Plan:  Alexandra [name] is 73 y.o. [place] female. She has essential thrombocythemia. She actually is Calreticulin positive.   Her platelet count is up a little bit more.  We will just have to watch this.  Her platelets do tend to fluctuate on occasion.  I will plan to have her come back probably between Thanksgiving and Christmas.  She is always anemic.  She is totally asymptomatic with this anemia.     Maude JONELLE Crease, MD 10/3/202510:13 AM

## 2023-12-04 ENCOUNTER — Ambulatory Visit: Payer: Self-pay | Admitting: Hematology & Oncology

## 2023-12-04 LAB — CA 125: Cancer Antigen (CA) 125: 8.2 U/mL (ref 0.0–38.1)

## 2023-12-08 ENCOUNTER — Other Ambulatory Visit: Payer: Self-pay

## 2023-12-08 DIAGNOSIS — D473 Essential (hemorrhagic) thrombocythemia: Secondary | ICD-10-CM

## 2023-12-08 MED ORDER — ANAGRELIDE HCL 1 MG PO CAPS: 4.0000 mg | ORAL_CAPSULE | Freq: Every day | ORAL | 1 refills | Status: AC

## 2023-12-08 NOTE — Telephone Encounter (Signed)
 Pt called in requesting a refill on her Agrylin sent to [facility] on [address] and [address] Pt last received a 6 month supply on 04/26/23. Please advise, thanks!

## 2023-12-16 ENCOUNTER — Ambulatory Visit
Admission: RE | Admit: 2023-12-16 | Discharge: 2023-12-16 | Disposition: A | Discharge status: Home / Self Care | Source: Ambulatory Visit | Attending: Internal Medicine | Admitting: Internal Medicine

## 2023-12-16 DIAGNOSIS — Z1231 Encounter for screening mammogram for malignant neoplasm of breast: Secondary | ICD-10-CM | POA: Diagnosis not present

## 2023-12-16 DIAGNOSIS — Z0000 Encounter for general adult medical examination without abnormal findings: Secondary | ICD-10-CM

## 2023-12-25 ENCOUNTER — Other Ambulatory Visit: Payer: Self-pay | Admitting: Internal Medicine

## 2024-01-12 DIAGNOSIS — H401121 Primary open-angle glaucoma, left eye, mild stage: Secondary | ICD-10-CM | POA: Diagnosis not present

## 2024-01-12 DIAGNOSIS — H40011 Open angle with borderline findings, low risk, right eye: Secondary | ICD-10-CM | POA: Diagnosis not present

## 2024-01-12 DIAGNOSIS — H25813 Combined forms of age-related cataract, bilateral: Secondary | ICD-10-CM | POA: Diagnosis not present

## 2024-01-12 DIAGNOSIS — E113293 Type 2 diabetes mellitus with mild nonproliferative diabetic retinopathy without macular edema, bilateral: Secondary | ICD-10-CM | POA: Diagnosis not present

## 2024-01-12 DIAGNOSIS — H33322 Round hole, left eye: Secondary | ICD-10-CM | POA: Diagnosis not present

## 2024-01-12 LAB — OPHTHALMOLOGY REPORT-SCANNED

## 2024-01-14 ENCOUNTER — Inpatient Hospital Stay: Attending: Hematology & Oncology | Admitting: Gynecologic Oncology

## 2024-01-14 ENCOUNTER — Encounter: Payer: Self-pay | Admitting: Gynecologic Oncology

## 2024-01-14 VITALS — BP 128/65 | HR 92 | Temp 97.8°F | Resp 18 | Wt 138.6 lb

## 2024-01-14 DIAGNOSIS — Z9079 Acquired absence of other genital organ(s): Secondary | ICD-10-CM | POA: Insufficient documentation

## 2024-01-14 DIAGNOSIS — C549 Malignant neoplasm of corpus uteri, unspecified: Secondary | ICD-10-CM

## 2024-01-14 DIAGNOSIS — Z90722 Acquired absence of ovaries, bilateral: Secondary | ICD-10-CM | POA: Diagnosis not present

## 2024-01-14 DIAGNOSIS — Z8542 Personal history of malignant neoplasm of other parts of uterus: Secondary | ICD-10-CM | POA: Diagnosis not present

## 2024-01-14 DIAGNOSIS — Z90710 Acquired absence of both cervix and uterus: Secondary | ICD-10-CM | POA: Diagnosis not present

## 2024-01-14 NOTE — Patient Instructions (Signed)
 It was good to see you today.  I do not see or feel any evidence of cancer recurrence on your exam.  We will see you for follow-up in 3 months.  As always, if you develop any new and concerning symptoms before your next visit, please call to see me sooner.

## 2024-01-14 NOTE — Progress Notes (Signed)
 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  Endocervical lesion thought to represent a polyp measures 7 mm.  Endometrium is asymmetrically thickened measuring up to 22 mm, noted to be cystic and vascular concerning for possible polyps.  Moderate free fluid noted.  Neither ovary well appreciated.    10/10/2021 Initial Biopsy   EMB: benign endometrial polyp, scant benign endocervix.    11/18/2021 Surgery   Endocervical polypectomy, endometrial polypectomy, and D&C. Findings at the time of surgery included an enlarged anteverted uterus, no adnexal masses. Cervical polyp noted. On hysteroscopy, multiple endometrial polyps with some thickening of the uterine wall noted. Final pathology revealed benign cervical polyp with metaplastic changes. Endometrial polyp revealed high-grade serous carcinoma as did the endometrial curettage specimen.    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 Results of the tumor marker test revealed normal level, 17.1.   12/05/2021 Imaging   CT C/A/P: 1. Heterogeneity of the uterus and cervix with endometrial fluid, compatible with the provided history of endometrial cancer. No evidence of metastatic disease. 2. Small pericardial effusion. 3. Liver may be mildly steatotic. 4. Retroareolar left breast nodule measures 1.5 cm. Patient underwent diagnostic left mammogram and ultrasound on 11/21/2021. 5. Overall dense appearance of the bones, compatible with a known history of myeloproliferative neoplasm. 6.  Aortic atherosclerosis (ICD10-I70.0).   12/24/2021 Surgery    Robotic-assisted lysis of adhesions, total hysterectomy with bilateral salpingoophorectomy, SLN biopsy bilaterally, resection of peritoneal lesions, omentectomy    Findings:  On EUA, 10-12 cm moderately mobile uterus. 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 Normal appearing liver otherwise, stomach, and diaphragm. Normal appearing omentum. Some adhesions of the cecum and ascending colon to the right abdominal sidewall and of the appendix to the right IP ligament and medial aspect of the broad ligament. Otherwise normal appearing small and large bowel. Small volume ascites in the pelvis. Normal appearing adnexa. No obvious adenopathy. Mapping successful to right presacral and left external iliac SLN. Some adhesions between the bladder peritoneum and the anterior uterus. Two small (<1-2 cm) intramural anterior LUS fibroids. Uterus somewhat enlarged and bulbous at the fundus. Numerous bulbous/cystic appearing lesions along the peritoneum - posterior cul de sac, uterosacral ligaments, anterior cul de sac. Findings most c/w endosalpingosis versus endometriosis. Frozen section of some of these lesions sent and not c/w metastatic clear cell carcinoma, defer further characterization to final pathology.     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  Denies any vaginal bleeding.  Reports normal bowel and bladder function.  Denies any abdominal pain.  Past Medical/Surgical History: Past Medical History:  Diagnosis Date   Blood dyscrasia    Diabetes mellitus without complication (HCC)    Goals of care, counseling/discussion 12/15/2019   Heart murmur    History of kidney stones    5-6 yrs ago. 10/30/2021    Hypertension  echo    Iron deficiency anemia, unspecified 03/15/2013   Myeloproliferative neoplasm (HCC) 12/15/2019   Thrombocytosis     Past Surgical History:  Procedure Laterality Date   ABDOMINAL HYSTERECTOMY     BONE MARROW BIOPSY     has had 3 of them but doesnt remember when.  10/30/2021   DILATATION & CURETTAGE/HYSTEROSCOPY WITH MYOSURE N/A 11/18/2021   Procedure: DILATATION & CURETTAGE/HYSTEROSCOPY WITH MYOSURE;  Surgeon: Jannis Kate Norris, MD;  Location: [facility];  Service: Gynecology;  Laterality: N/A;   ECTOPIC PREGNANCY SURGERY  03/02/1982   salpingostomy   ROBOTIC ASSISTED LAPAROSCOPIC LYSIS OF ADHESION N/A 12/24/2021   Procedure: XI ROBOTIC ASSISTED LAPAROSCOPIC LYSIS OF ADHESION;  Surgeon: Viktoria Comer SAUNDERS, MD;  Location: [facility];  Service: Gynecology;  Laterality: N/A;   SENTINEL NODE BIOPSY Bilateral 12/24/2021   Procedure: SENTINEL NODE BIOPSY;  Surgeon: Viktoria Comer SAUNDERS, MD;  Location: [facility];  Service: Gynecology;  Laterality: Bilateral;    Family History  Problem Relation Age of Onset   Heart disease Mother        died age 44   Kidney disease Mother        family hx   Heart attack Father 56       massive   Hypertension Sister    Hypertension Sister    Hypertension Sister    Hypertension Sister    Arthritis Other        family hx   Diabetes Other        family hx   Stroke Other         dialysis   Colon cancer Neg Hx    Breast cancer Neg Hx    Ovarian cancer Neg Hx    Endometrial cancer Neg Hx    Pancreatic cancer Neg Hx    Prostate cancer Neg Hx     Social History   Socioeconomic History   Marital status: Married    Spouse name: Not on file   Number of children: Not on file   Years of education: Not on file   Highest education level: Not on file  Occupational History   Occupation: retired   Occupation: part-time works for her church  Tobacco Use   Smoking status: Never   Smokeless tobacco: Never   Tobacco  comments:    never used tobacco  Vaping Use   Vaping status: Never Used  Substance and Sexual Activity   Alcohol use: No    Alcohol/week: 0.0 standard drinks of alcohol   Drug use: No   Sexual activity: Not Currently    Partners: Male    Birth control/protection: Post-menopausal  Other Topics Concern   Not on file  Social History Narrative   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   No tobacco no ethoh little caffiene .    Social Drivers of Corporate Investment Banker Strain: Low Risk  (02/01/2023)   Overall Financial Resource Strain (CARDIA)    Difficulty of Paying Living Expenses: Not hard at all  Food Insecurity: No Food Insecurity (02/01/2023)   Hunger Vital Sign    Worried About Running Out of Food in the Last Year: Never true    Ran Out of Food in the Last Year: Never true  Transportation Needs: No Transportation Needs (02/01/2023)   PRAPARE - Administrator, Civil Service (Medical): No  Lack of Transportation (Non-Medical): No  Physical Activity: Insufficiently Active (02/01/2023)   Exercise Vital Sign    Days of Exercise per Week: 3 days    Minutes of Exercise per Session: 30 min  Stress: No Stress Concern Present (02/01/2023)   [employer] of Occupational Health - Occupational Stress Questionnaire    Feeling of Stress : Not at all  Social Connections: Socially Integrated (02/01/2023)   Social Connection and Isolation Panel    Frequency of Communication with Friends and Family: More than three times a week    Frequency of Social Gatherings with Friends and Family: More than three times a week    Attends Religious Services: More than 4 times per year    Active Member of [employer] or Organizations: Yes    Attends Engineer, Structural: More than 4 times per year    Marital Status: Married    Current Medications:  Current Outpatient Medications:    amLODipine  (NORVASC ) 10 MG tablet, TAKE 1 TABLET BY MOUTH  EVERY DAY, Disp: 90 tablet, Rfl: 1   anagrelide  (AGRYLIN) 1 MG capsule, Take 4 capsules (4 mg total) by mouth daily., Disp: 350 capsule, Rfl: 1   aspirin EC 81 MG tablet, Take 81 mg by mouth every evening., Disp: , Rfl:    Fe Fum-FePoly-Vit C-Vit B3 (INTEGRA) 62.5-62.5-40-3 MG CAPS, Take 1 capsule by mouth daily., Disp: 30 capsule, Rfl: 11   folic acid  (FOLVITE ) 1 MG tablet, Take 2 tablets (2 mg total) by mouth daily., Disp: 180 tablet, Rfl: 2   lisinopril  (ZESTRIL ) 20 MG tablet, TAKE 2 TABLETS BY MOUTH EVERY DAY, Disp: 60 tablet, Rfl: 2   Nebivolol  HCl 20 MG TABS, TAKE 1 TABLET(20 MG) BY MOUTH DAILY, Disp: 90 tablet, Rfl: 1   Vitamin D , Ergocalciferol , (DRISDOL ) 1.25 MG (50000 UNIT) CAPS capsule, TAKE 1 CAPSULE BY MOUTH 1 TIME A WEEK, Disp: 12 capsule, Rfl: 2  Review of Systems: Denies appetite changes, fevers, chills, fatigue, unexplained weight changes. Denies hearing loss, neck lumps or masses, mouth sores, ringing in ears or voice changes. Denies cough or wheezing.  Denies shortness of breath. Denies chest pain or palpitations. Denies leg swelling. Denies abdominal distention, pain, blood in stools, constipation, diarrhea, nausea, vomiting, or early satiety. Denies pain with intercourse, dysuria, frequency, hematuria or incontinence. Denies hot flashes, pelvic pain, vaginal bleeding or vaginal discharge.   Denies joint pain, back pain or muscle pain/cramps. Denies itching, rash, or wounds. Denies dizziness, headaches, numbness or seizures. Denies swollen lymph nodes or glands, denies easy bruising or bleeding. Denies anxiety, depression, confusion, or decreased concentration.  Physical Exam: BP 128/65 (BP Location: Left Arm, Patient Position: Sitting)   Pulse 92 Comment: manual recheck  Temp 97.8 F (36.6 C) (Oral)   Resp 18   Wt 138 lb 9.6 oz (62.9 kg)   SpO2 100%   BMI 19.33 kg/m  General: Alert, oriented, no acute distress. HEENT: Normocephalic, atraumatic, sclera  anicteric. Chest: Clear to auscultation bilaterally.  No wheezes or rhonchi. Cardiovascular: HR in 90s, regular rhythm, no murmurs. Abdomen: soft, nontender.  Normoactive bowel sounds.  No masses or hepatosplenomegaly appreciated.  Well-healed incisions. Extremities: Grossly normal range of motion.  Warm, well perfused.  No edema bilaterally. Skin: No rashes or lesions noted. Lymphatics: No cervical, supraclavicular, or inguinal adenopathy. GU: Normal appearing external genitalia without erythema, excoriation, or lesions.  Speculum exam reveals mildly atrophic vaginal mucosa.  Cuff is intact, mild discoloration along the entire cuff line (unchanged in apeparance), no  masses or atypical vascularity.  Bimanual exam reveals cuff intact, no nodularity.  Rectovaginal exam confirms findings, no masses palpated.  Laboratory & Radiologic Studies:          Component Ref Range & Units (hover) 1 mo ago (12/03/23) 3 mo ago (10/15/23) 8 mo ago (04/26/23) 1 yr ago (12/15/22) 1 yr ago (08/14/22) 1 yr ago (07/03/22) 1 yr ago (05/15/22)  Cancer Antigen (CA) 125 8.2 8.0 CM 6.5 CM 6.6 CM 6.6 CM 6.3 CM 7.1     Assessment & Plan: Alexandra [name] is a 73 y.o. 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 Surgery 11/2021. Tumor confined to endometrium, negative washings. After discussion, patient opted against adjuvant therapy. MMR intact, MS-stable, Her2 2 +.   The patient is doing very well, is NED on exam today.     Get CA125 checked recently with her medical oncologist.   Per NCCN surveillance recommendations, in the setting of high risk disease, we will continue with visits every 3 months for the first 2-3 years before transitioning to 46-month surveillance visits.  The patient will see Melissa in 3 months and return to see me in 6 months.  We again reviewed signs and symptoms that would be concerning for cancer recurrence and I stressed the importance of  calling if she develops any of these before her next visit.  20 minutes of total time was spent for this patient encounter, including preparation, face-to-face counseling with the patient and coordination of care, and documentation of the encounter.  Comer Dollar, MD  Division of Gynecologic Oncology  Department of Obstetrics and Gynecology  [facility]

## 2024-02-01 ENCOUNTER — Encounter: Payer: Self-pay | Admitting: Hematology & Oncology

## 2024-02-01 ENCOUNTER — Other Ambulatory Visit: Payer: Self-pay

## 2024-02-01 ENCOUNTER — Inpatient Hospital Stay: Attending: Hematology & Oncology

## 2024-02-01 ENCOUNTER — Inpatient Hospital Stay: Admitting: Hematology & Oncology

## 2024-02-01 VITALS — BP 147/77 | HR 82 | Temp 97.9°F | Resp 16 | Ht 71.0 in | Wt 139.0 lb

## 2024-02-01 DIAGNOSIS — N189 Chronic kidney disease, unspecified: Secondary | ICD-10-CM | POA: Insufficient documentation

## 2024-02-01 DIAGNOSIS — D471 Chronic myeloproliferative disease: Secondary | ICD-10-CM

## 2024-02-01 DIAGNOSIS — D631 Anemia in chronic kidney disease: Secondary | ICD-10-CM | POA: Diagnosis not present

## 2024-02-01 DIAGNOSIS — D649 Anemia, unspecified: Secondary | ICD-10-CM | POA: Insufficient documentation

## 2024-02-01 DIAGNOSIS — D473 Essential (hemorrhagic) thrombocythemia: Secondary | ICD-10-CM | POA: Diagnosis not present

## 2024-02-01 DIAGNOSIS — Z8542 Personal history of malignant neoplasm of other parts of uterus: Secondary | ICD-10-CM | POA: Diagnosis not present

## 2024-02-01 DIAGNOSIS — Z79899 Other long term (current) drug therapy: Secondary | ICD-10-CM | POA: Diagnosis not present

## 2024-02-01 DIAGNOSIS — C549 Malignant neoplasm of corpus uteri, unspecified: Secondary | ICD-10-CM

## 2024-02-01 DIAGNOSIS — Z7982 Long term (current) use of aspirin: Secondary | ICD-10-CM | POA: Diagnosis not present

## 2024-02-01 LAB — CBC WITH DIFFERENTIAL (CANCER CENTER ONLY)
Abs Immature Granulocytes: 0.04 K/uL (ref 0.00–0.07)
Basophils Absolute: 0 K/uL (ref 0.0–0.1)
Basophils Relative: 1 %
Eosinophils Absolute: 0.1 K/uL (ref 0.0–0.5)
Eosinophils Relative: 2 %
HCT: 25.9 % — ABNORMAL LOW (ref 36.0–46.0)
Hemoglobin: 8 g/dL — ABNORMAL LOW (ref 12.0–15.0)
Immature Granulocytes: 1 %
Lymphocytes Relative: 21 %
Lymphs Abs: 1.3 K/uL (ref 0.7–4.0)
MCH: 24.9 pg — ABNORMAL LOW (ref 26.0–34.0)
MCHC: 30.9 g/dL (ref 30.0–36.0)
MCV: 80.7 fL (ref 80.0–100.0)
Monocytes Absolute: 0.6 K/uL (ref 0.1–1.0)
Monocytes Relative: 9 %
Neutro Abs: 4.2 K/uL (ref 1.7–7.7)
Neutrophils Relative %: 66 %
Platelet Count: 491 K/uL — ABNORMAL HIGH (ref 150–400)
RBC: 3.21 MIL/uL — ABNORMAL LOW (ref 3.87–5.11)
RDW: 19.9 % — ABNORMAL HIGH (ref 11.5–15.5)
WBC Count: 6.3 K/uL (ref 4.0–10.5)
nRBC: 0 % (ref 0.0–0.2)

## 2024-02-01 LAB — CMP (CANCER CENTER ONLY)
ALT: 15 U/L (ref 0–44)
AST: 20 U/L (ref 15–41)
Albumin: 4.4 g/dL (ref 3.5–5.0)
Alkaline Phosphatase: 100 U/L (ref 38–126)
Anion gap: 10 (ref 5–15)
BUN: 31 mg/dL — ABNORMAL HIGH (ref 8–23)
CO2: 27 mmol/L (ref 22–32)
Calcium: 9.6 mg/dL (ref 8.9–10.3)
Chloride: 107 mmol/L (ref 98–111)
Creatinine: 1.18 mg/dL — ABNORMAL HIGH (ref 0.44–1.00)
GFR, Estimated: 49 mL/min — ABNORMAL LOW (ref >60)
Glucose, Bld: 110 mg/dL — ABNORMAL HIGH (ref 70–99)
Potassium: 3.8 mmol/L (ref 3.5–5.1)
Sodium: 144 mmol/L (ref 135–145)
Total Bilirubin: 0.5 mg/dL (ref 0.0–1.2)
Total Protein: 7.4 g/dL (ref 6.5–8.1)

## 2024-02-01 LAB — IRON AND IRON BINDING CAPACITY (CC-WL,HP ONLY)
Iron: 76 ug/dL (ref 28–170)
Saturation Ratios: 27 % (ref 10.4–31.8)
TIBC: 277 ug/dL (ref 250–450)
UIBC: 201 ug/dL

## 2024-02-01 LAB — SAVE SMEAR(SSMR), FOR PROVIDER SLIDE REVIEW

## 2024-02-01 LAB — FERRITIN: Ferritin: 363 ng/mL — ABNORMAL HIGH (ref 11–307)

## 2024-02-01 LAB — LACTATE DEHYDROGENASE: LDH: 250 U/L — ABNORMAL HIGH (ref 105–235)

## 2024-02-01 NOTE — Progress Notes (Signed)
 Hematology and Oncology Follow Up Visit  CABRINI RUGGIERI [id number] 05/09/1950 73 y.o. 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 daily --changed on 08/14/2022 Folic acid  2 mg p.o. daily  Aspirin 81 mg p.o. daily  Procrit  40,000 units SQ weekly for hemoglobin less than 10-patient declined to date S/p TAH-BSO -- 12/24/2021  Interim History:  Ms.  Monds is back for followup.   Everything is going quite well for her.  She really has had no complaints since we last saw her.  She had a very nice Thanksgiving.  It was fairly quiet.  She has had no problems with cough or shortness of breath.  She has had no rashes.  There has been no bleeding.  She saw Dr. Viktoria last month for follow-up for the endometrial cancer.  Everything is going well with that.  She has had no issues with fever.  There is been no headache.  Overall, I would have to say that her performance status is probably ECOG 1.    Wt Readings from Last 3 Encounters:  02/01/24 139 lb (63 kg)  01/14/24 138 lb 9.6 oz (62.9 kg)  12/03/23 135 lb (61.2 kg)   Medications:  Current Outpatient Medications:    amLODipine  (NORVASC ) 10 MG tablet, TAKE 1 TABLET BY MOUTH EVERY DAY, Disp: 90 tablet, Rfl: 1   anagrelide  (AGRYLIN) 1 MG capsule, Take 4 capsules (4 mg total) by mouth daily., Disp: 350 capsule, Rfl: 1   aspirin EC 81 MG tablet, Take 81 mg by mouth every evening., Disp: , Rfl:    Fe Fum-FePoly-Vit C-Vit B3 (INTEGRA) 62.5-62.5-40-3 MG CAPS, Take 1 capsule by mouth daily., Disp: 30 capsule, Rfl: 11   folic acid  (FOLVITE ) 1 MG tablet, Take 2 tablets (2 mg total) by mouth daily., Disp: 180 tablet, Rfl: 2   lisinopril  (ZESTRIL ) 20 MG tablet, TAKE 2 TABLETS BY MOUTH EVERY DAY, Disp: 60 tablet, Rfl: 2   Nebivolol  HCl 20 MG TABS, TAKE 1 TABLET(20 MG) BY MOUTH  DAILY, Disp: 90 tablet, Rfl: 1   Vitamin D , Ergocalciferol , (DRISDOL ) 1.25 MG (50000 UNIT) CAPS capsule, TAKE 1 CAPSULE BY MOUTH 1 TIME A WEEK, Disp: 12 capsule, Rfl: 2  Allergies: No Known Allergies  Past Medical History, Surgical history, Social history, and Family History were reviewed and updated.  Review of Systems: Review of Systems  Constitutional: Negative.   HENT: Negative.    Eyes: Negative.   Respiratory: Negative.    Cardiovascular: Negative.   Gastrointestinal: Negative.   Genitourinary: Negative.   Musculoskeletal: Negative.   Skin: Negative.   Neurological: Negative.   Endo/Heme/Allergies: Negative.   Psychiatric/Behavioral: Negative.    SABRA  Physical Exam:  height is 5' 11 (1.803 m) and weight is 139 lb (63 kg). Her oral temperature is 97.9 F (36.6 C). Her blood pressure is 147/77 (abnormal) and her pulse is 82. Her respiration is 16 and oxygen saturation is 100%. =jh  Physical Exam Vitals reviewed.  HENT:     Head: Normocephalic and atraumatic.  Eyes:     Pupils: Pupils are equal, round, and reactive to light.  Cardiovascular:     Rate and Rhythm: Normal rate and regular rhythm.     Heart sounds: Normal heart sounds.  Pulmonary:     Effort: Pulmonary effort is normal.     Breath sounds:  Normal breath sounds.  Abdominal:     General: Bowel sounds are normal.     Palpations: Abdomen is soft.     Comments: Abdominal exam shows well-healed laparoscopic scars.  There is no fluid wave.  There is no guarding or rebound tenderness.  There is no palpable hepatosplenomegaly.  Musculoskeletal:        General: No tenderness or deformity. Normal range of motion.     Cervical back: Normal range of motion.  Lymphadenopathy:     Cervical: No cervical adenopathy.  Skin:    General: Skin is warm and dry.     Findings: No erythema or rash.  Neurological:     Mental Status: She is alert and oriented to person, place, and time.  Psychiatric:        Behavior: Behavior  normal.        Thought Content: Thought content normal.        Judgment: Judgment normal.      Lab Results  Component Value Date   WBC 6.3 02/01/2024   HGB 8.0 (L) 02/01/2024   HCT 25.9 (L) 02/01/2024   MCV 80.7 02/01/2024   PLT 491 (H) 02/01/2024     Chemistry      Component Value Date/Time   NA 144 02/01/2024 0943   NA 144 02/02/2017 1158   NA 140 07/24/2016 1007   K 3.8 02/01/2024 0943   K 3.7 02/02/2017 1158   K 3.6 07/24/2016 1007   CL 107 02/01/2024 0943   CL 103 02/02/2017 1158   CO2 27 02/01/2024 0943   CO2 29 02/02/2017 1158   CO2 26 07/24/2016 1007   BUN 31 (H) 02/01/2024 0943   BUN 20 02/02/2017 1158   BUN 30.3 (H) 07/24/2016 1007   CREATININE 1.18 (H) 02/01/2024 0943   CREATININE 0.9 02/02/2017 1158   CREATININE 1.1 07/24/2016 1007   GLU 129 02/02/2017 0000      Component Value Date/Time   CALCIUM  9.6 02/01/2024 0943   CALCIUM  9.0 02/02/2017 1158   CALCIUM  9.3 07/24/2016 1007   ALKPHOS 100 02/01/2024 0943   ALKPHOS 110 (H) 02/02/2017 1158   ALKPHOS 104 07/24/2016 1007   AST 20 02/01/2024 0943   AST 13 07/24/2016 1007   ALT 15 02/01/2024 0943   ALT 26 02/02/2017 1158   ALT 15 07/24/2016 1007   BILITOT 0.5 02/01/2024 0943   BILITOT 0.55 07/24/2016 1007      Impression and Plan:  Ms. Flaugher is 73 y.o. [place] female. She has essential thrombocythemia. She actually is Calreticulin positive.   The platelet count is holding pretty stable.  As such, I am not going to make any change with the anagrelide .  As always, she is anemic.  This is holding steady.  I told her to make sure that she takes iron as she is iron deficient.  I would like to see her back now in a couple months.  We will try to get her through most of Winter.     Maude JONELLE Crease, MD 12/2/202510:44 AM

## 2024-02-02 LAB — CA 125: Cancer Antigen (CA) 125: 9.1 U/mL (ref 0.0–38.1)

## 2024-02-03 ENCOUNTER — Other Ambulatory Visit: Payer: Self-pay | Admitting: Family

## 2024-02-04 ENCOUNTER — Ambulatory Visit: Payer: Medicare PPO

## 2024-02-04 VITALS — Ht 71.0 in | Wt 139.0 lb

## 2024-02-04 DIAGNOSIS — Z0000 Encounter for general adult medical examination without abnormal findings: Secondary | ICD-10-CM | POA: Diagnosis not present

## 2024-02-04 NOTE — Patient Instructions (Addendum)
 Alexandra [name],  Thank you for taking the time for your Medicare Wellness Visit. I appreciate your continued commitment to your health goals. Please review the care plan we discussed, and feel free to reach out if I can assist you further.  Please note that Annual Wellness Visits do not include a physical exam. Some assessments may be limited, especially if the visit was conducted virtually. If needed, we may recommend an in-person follow-up with your provider.  Ongoing Care Seeing your primary care provider every 3 to 6 months helps us  monitor your health and provide consistent, personalized care.   Referrals If a referral was made during today's visit and you haven't received any updates within two weeks, please contact the referred provider directly to check on the status.  Recommended Screenings:  Health Maintenance  Topic Date Due   Hepatitis C Screening  Never done   Colon Cancer Screening  05/02/2019   Complete foot exam   08/26/2022   Flu Shot  10/01/2023   COVID-19 Vaccine (4 - 2025-26 season) 11/01/2023   Hemoglobin A1C  11/26/2023   DTaP/Tdap/Td vaccine (1 - Tdap) 03/01/2024*   Pneumococcal Vaccine for age over 71 (1 of 2 - PCV) 05/30/2024*   Zoster (Shingles) Vaccine (1 of 2) 03/01/2025*   Yearly kidney health urinalysis for diabetes  09/09/2024   Eye exam for diabetics  01/11/2025   Yearly kidney function blood test for diabetes  01/31/2025   Medicare Annual Wellness Visit  02/03/2025   Breast Cancer Screening  12/15/2025   Osteoporosis screening with Bone Density Scan  Completed   Meningitis B Vaccine  Aged Out  *Topic was postponed. The date shown is not the original due date.       02/04/2024    8:49 AM  Advanced Directives  Does Patient Have a Medical Advance Directive? No  Does patient want to make changes to medical advance directive? No - Patient declined  Would patient like information on creating a medical advance directive? No - Patient declined    Vision:  Annual vision screenings are recommended for early detection of glaucoma, cataracts, and diabetic retinopathy. These exams can also reveal signs of chronic conditions such as diabetes and high blood pressure.  Dental: Annual dental screenings help detect early signs of oral cancer, gum disease, and other conditions linked to overall health, including heart disease and diabetes.  Please see the attached documents for additional preventive care recommendations.

## 2024-02-04 NOTE — Progress Notes (Signed)
 Chief Complaint  Patient presents with   Medicare Wellness     Subjective:   Alexandra [name] is a 73 y.o. female who presents for a Medicare Annual Wellness Visit.  Visit info / Clinical Intake: Medicare Wellness Visit Type:: Subsequent Annual Wellness Visit Persons participating in visit and providing information:: patient Medicare Wellness Visit Mode:: Telephone If telephone:: video declined Since this visit was completed virtually, some vitals may be partially provided or unavailable. Missing vitals are due to the limitations of the virtual format.: Documented vitals are patient reported If Telephone or Video please confirm:: I connected with patient using audio/video enable telemedicine. I verified patient identity with two identifiers, discussed telehealth limitations, and patient agreed to proceed. Patient Location:: Home Provider Location:: Office Interpreter Needed?: No Pre-visit prep was completed: no AWV questionnaire completed by patient prior to visit?: no Living arrangements:: lives with spouse/significant other Patient's Overall Health Status Rating: good Typical amount of pain: none Does pain affect daily life?: no Are you currently prescribed opioids?: no  Dietary Habits and Nutritional Risks How many meals a day?: 2 Eats fruit and vegetables daily?: yes Most meals are obtained by: preparing own meals In the last 2 weeks, have you had any of the following?: none Diabetic:: (!) yes Any non-healing wounds?: no How often do you check your BS?: as needed Would you like to be referred to a Nutritionist or for Diabetic Management? : no  Functional Status Activities of Daily Living (to include ambulation/medication): Independent Ambulation: Independent with device- listed below Home Assistive Devices/Equipment: Eyeglasses Medication Administration: Independent Home Management (perform basic housework or laundry): Independent Manage your own finances?:  yes Primary transportation is: driving Concerns about vision?: no *vision screening is required for WTM* Concerns about hearing?: no  Fall Screening Falls in the past year?: 0 Number of falls in past year: 0 Was there an injury with Fall?: 0 Fall Risk Category Calculator: 0 Patient Fall Risk Level: Low Fall Risk  Fall Risk Patient at Risk for Falls Due to: No Fall Risks  Home and Transportation Safety: All rugs have non-skid backing?: yes All stairs or steps have railings?: yes Grab bars in the bathtub or shower?: (!) no Have non-skid surface in bathtub or shower?: (!) no Good home lighting?: yes Regular seat belt use?: yes Hospital stays in the last year:: no  Cognitive Assessment Difficulty concentrating, remembering, or making decisions? : no Will 6CIT or Mini Cog be Completed: no 6CIT or Mini Cog Declined: patient alert, oriented, able to answer questions appropriately and recall recent events  Advance Directives (For Healthcare) Does Patient Have a Medical Advance Directive?: No Does patient want to make changes to medical advance directive?: No - Patient declined Would patient like information on creating a medical advance directive?: No - Patient declined  Reviewed/Updated  Reviewed/Updated: Reviewed All (Medical, Surgical, Family, Medications, Allergies, Care Teams, Patient Goals)    Allergies (verified) Patient has no known allergies.   Current Medications (verified) Outpatient Encounter Medications as of 02/04/2024  Medication Sig   amLODipine  (NORVASC ) 10 MG tablet TAKE 1 TABLET BY MOUTH EVERY DAY   anagrelide  (AGRYLIN) 1 MG capsule Take 4 capsules (4 mg total) by mouth daily.   aspirin EC 81 MG tablet Take 81 mg by mouth every evening.   Fe Fum-FePoly-Vit C-Vit B3 (INTEGRA) 62.5-62.5-40-3 MG CAPS Take 1 capsule by mouth daily.   folic acid  (FOLVITE ) 1 MG tablet Take 2 tablets (2 mg total) by mouth daily.   lisinopril  (ZESTRIL ) 20  MG tablet TAKE 2 TABLETS  BY MOUTH EVERY DAY   Nebivolol  HCl 20 MG TABS TAKE 1 TABLET(20 MG) BY MOUTH DAILY   Vitamin D , Ergocalciferol , (DRISDOL ) 1.25 MG (50000 UNIT) CAPS capsule TAKE 1 CAPSULE BY MOUTH 1 TIME A WEEK   No facility-administered encounter medications on file as of 02/04/2024.    History: Past Medical History:  Diagnosis Date   Blood dyscrasia    Diabetes mellitus without complication (HCC)    Goals of care, counseling/discussion 12/15/2019   Heart murmur    History of kidney stones    5-6 yrs ago. 10/30/2021   Hypertension    echo    Iron deficiency anemia, unspecified 03/15/2013   Myeloproliferative neoplasm (HCC) 12/15/2019   Thrombocytosis    Past Surgical History:  Procedure Laterality Date   ABDOMINAL HYSTERECTOMY     BONE MARROW BIOPSY     has had 3 of them but doesnt remember when.  10/30/2021   DILATATION & CURETTAGE/HYSTEROSCOPY WITH MYOSURE N/A 11/18/2021   Procedure: DILATATION & CURETTAGE/HYSTEROSCOPY WITH MYOSURE;  Surgeon: Jannis Kate Norris, MD;  Location: [facility] [place];  Service: Gynecology;  Laterality: N/A;   ECTOPIC PREGNANCY SURGERY  03/02/1982   salpingostomy   ROBOTIC ASSISTED LAPAROSCOPIC LYSIS OF ADHESION N/A 12/24/2021   Procedure: XI ROBOTIC ASSISTED LAPAROSCOPIC LYSIS OF ADHESION;  Surgeon: Viktoria Comer SAUNDERS, MD;  Location: [facility];  Service: Gynecology;  Laterality: N/A;   SENTINEL NODE BIOPSY Bilateral 12/24/2021   Procedure: SENTINEL NODE BIOPSY;  Surgeon: Viktoria Comer SAUNDERS, MD;  Location: [facility];  Service: Gynecology;  Laterality: Bilateral;   Family History  Problem Relation Age of Onset   Heart disease Mother        died age 48   Kidney disease Mother        family hx   Heart attack Father 19       massive   Hypertension Sister    Hypertension Sister    Hypertension Sister    Hypertension Sister    Arthritis Other        family hx   Diabetes Other        family hx   Stroke Other         dialysis   Colon cancer Neg Hx     Breast cancer Neg Hx    Ovarian cancer Neg Hx    Endometrial cancer Neg Hx    Pancreatic cancer Neg Hx    Prostate cancer Neg Hx    Social History   Occupational History   Occupation: retired   Occupation: part-time works for her church  Tobacco Use   Smoking status: Never   Smokeless tobacco: Never   Tobacco comments:    never used tobacco  Vaping Use   Vaping status: Never Used  Substance and Sexual Activity   Alcohol use: No    Alcohol/week: 0.0 standard drinks of alcohol   Drug use: No   Sexual activity: Not Currently    Partners: Male    Birth control/protection: Post-menopausal   Tobacco Counseling Counseling given: No Tobacco comments: never used tobacco  SDOH Screenings   Food Insecurity: No Food Insecurity (02/04/2024)  Housing: Unknown (02/04/2024)  Transportation Needs: No Transportation Needs (02/04/2024)  Utilities: Not At Risk (02/04/2024)  Alcohol Screen: Low Risk  (02/01/2023)  Depression (PHQ2-9): Low Risk  (02/04/2024)  Financial Resource Strain: Low Risk  (02/01/2023)  Physical Activity: Insufficiently Active (02/04/2024)  Social Connections: Socially Integrated (02/04/2024)  Stress: No Stress Concern  Present (02/04/2024)  Tobacco Use: Low Risk  (02/04/2024)  Health Literacy: Adequate Health Literacy (02/04/2024)   See flowsheets for full screening details  Depression Screen PHQ 2 & 9 Depression Scale- Over the past 2 weeks, how often have you been bothered by any of the following problems? Little interest or pleasure in doing things: 0 Feeling down, depressed, or hopeless (PHQ Adolescent also includes...irritable): 0 PHQ-2 Total Score: 0     Goals Addressed               This Visit's Progress     Increase physical activity (pt-stated)        Get more active. Walk more.             Objective:    Today's Vitals   02/04/24 0849  Weight: 139 lb (63 kg)  Height: 5' 11 (1.803 m)   Body mass index is 19.39 kg/m.  Hearing/Vision  screen Hearing Screening - Comments:: Denies hearing difficulties   Vision Screening - Comments:: Wears rx glasses - up to date with routine eye exams with  [facility] Immunizations and Health Maintenance Health Maintenance  Topic Date Due   Hepatitis C Screening  Never done   Colonoscopy  05/02/2019   FOOT EXAM  08/26/2022   Influenza Vaccine  10/01/2023   COVID-19 Vaccine (4 - 2025-26 season) 11/01/2023   HEMOGLOBIN A1C  11/26/2023   DTaP/Tdap/Td (1 - Tdap) 03/01/2024 (Originally 05/07/1969)   Pneumococcal Vaccine: 50+ Years (1 of 2 - PCV) 05/30/2024 (Originally 05/07/1969)   Zoster Vaccines- Shingrix (1 of 2) 03/01/2025 (Originally 05/07/1969)   Diabetic kidney evaluation - Urine ACR  09/09/2024   OPHTHALMOLOGY EXAM  01/11/2025   Diabetic kidney evaluation - eGFR measurement  01/31/2025   Medicare Annual Wellness (AWV)  02/03/2025   Mammogram  12/15/2025   Bone Density Scan  Completed   Meningococcal B Vaccine  Aged Out        Assessment/Plan:  This is a routine wellness examination for Alexandra [name].  Patient Care Team: Panosh, Apolinar POUR, MD as PCP - General Timmy Maude SAUNDERS, MD as Attending Physician (Hematology and Oncology) Trixie File, MD as Consulting Physician (Internal Medicine)  I have personally reviewed and noted the following in the patient's chart:   Medical and social history Use of alcohol, tobacco or illicit drugs  Current medications and supplements including opioid prescriptions. Functional ability and status Nutritional status Physical activity Advanced directives List of other physicians Hospitalizations, surgeries, and ER visits in previous 12 months Vitals Screenings to include cognitive, depression, and falls Referrals and appointments  No orders of the defined types were placed in this encounter.  In addition, I have reviewed and discussed with patient certain preventive protocols, quality metrics, and best practice recommendations. A written  personalized care plan for preventive services as well as general preventive health recommendations were provided to patient.   Rojelio LELON Blush, LPN   [id number]   Return in 1 year on 02/09/25  After Visit Summary: (MyChart) Due to this being a telephonic visit, the after visit summary with patients personalized plan was offered to patient via MyChart   Nurse Notes: None

## 2024-02-10 ENCOUNTER — Other Ambulatory Visit: Payer: Self-pay | Admitting: Internal Medicine

## 2024-02-11 ENCOUNTER — Telehealth: Payer: Self-pay

## 2024-02-11 NOTE — Telephone Encounter (Signed)
 Copied from CRM #[id number]. Topic: Clinical - Medication Question >> Feb 11, 2024 10:14 AM Drema MATSU wrote: Reason for CRM: Patient wants to know why her Lisinopril  has not been filled. She has been waiting for a week on it. Please call patient cell.

## 2024-02-15 ENCOUNTER — Other Ambulatory Visit: Payer: Self-pay

## 2024-02-15 ENCOUNTER — Telehealth: Payer: Self-pay | Admitting: Internal Medicine

## 2024-02-15 MED ORDER — LISINOPRIL 20 MG PO TABS: 40.0000 mg | ORAL_TABLET | Freq: Every day | ORAL | 2 refills | Status: DC

## 2024-02-15 MED ORDER — LISINOPRIL 20 MG PO TABS: 40.0000 mg | ORAL_TABLET | Freq: Every day | ORAL | 1 refills | Status: AC

## 2024-02-15 NOTE — Telephone Encounter (Signed)
 Spoke to pt. Inform pt Rx is sent for 30 days supply with 2 refills. Pt reports she requested 90 days supply send. Rx was resent with 90 days supply. Pt is aware. Inform we will contact her pharmacy to canceled the 30 days supply. Alexandra [name]

## 2024-02-15 NOTE — Telephone Encounter (Signed)
 Pt is calling back about the refill on Lisinopril  and want a call back.

## 2024-02-15 NOTE — Telephone Encounter (Signed)
Refill is sent

## 2024-03-25 ENCOUNTER — Other Ambulatory Visit: Payer: Self-pay | Admitting: Internal Medicine

## 2024-04-03 ENCOUNTER — Inpatient Hospital Stay

## 2024-04-03 ENCOUNTER — Inpatient Hospital Stay: Admitting: Hematology & Oncology

## 2024-04-06 ENCOUNTER — Encounter: Payer: Self-pay | Admitting: Gynecologic Oncology

## 2024-04-11 ENCOUNTER — Inpatient Hospital Stay: Attending: Hematology & Oncology | Admitting: Gynecologic Oncology

## 2024-04-11 DIAGNOSIS — C549 Malignant neoplasm of corpus uteri, unspecified: Secondary | ICD-10-CM

## 2024-04-21 ENCOUNTER — Inpatient Hospital Stay: Admitting: Hematology & Oncology

## 2024-04-21 ENCOUNTER — Inpatient Hospital Stay

## 2024-05-30 ENCOUNTER — Encounter: Admitting: Internal Medicine

## 2024-07-13 ENCOUNTER — Inpatient Hospital Stay: Attending: Hematology & Oncology | Admitting: Gynecologic Oncology

## 2024-07-13 ENCOUNTER — Inpatient Hospital Stay: Admitting: Gynecologic Oncology

## 2025-02-09 ENCOUNTER — Ambulatory Visit
